# Patient Record
Sex: Male | Born: 1935 | Race: White | Hispanic: No | Marital: Married | Smoking: Never smoker
Health system: Southern US, Community
[De-identification: ages and names within clinical notes are randomized; demographics above are authoritative.]

## PROBLEM LIST (undated history)

## (undated) DIAGNOSIS — M1990 Unspecified osteoarthritis, unspecified site: Secondary | ICD-10-CM

## (undated) DIAGNOSIS — E7800 Pure hypercholesterolemia, unspecified: Secondary | ICD-10-CM

## (undated) DIAGNOSIS — I209 Angina pectoris, unspecified: Secondary | ICD-10-CM

## (undated) DIAGNOSIS — Z9861 Coronary angioplasty status: Secondary | ICD-10-CM

## (undated) DIAGNOSIS — K5792 Diverticulitis of intestine, part unspecified, without perforation or abscess without bleeding: Secondary | ICD-10-CM

## (undated) DIAGNOSIS — C44321 Squamous cell carcinoma of skin of nose: Secondary | ICD-10-CM

## (undated) DIAGNOSIS — N400 Enlarged prostate without lower urinary tract symptoms: Secondary | ICD-10-CM

## (undated) DIAGNOSIS — I340 Nonrheumatic mitral (valve) insufficiency: Secondary | ICD-10-CM

## (undated) DIAGNOSIS — Z87442 Personal history of urinary calculi: Secondary | ICD-10-CM

## (undated) DIAGNOSIS — I739 Peripheral vascular disease, unspecified: Secondary | ICD-10-CM

## (undated) DIAGNOSIS — K219 Gastro-esophageal reflux disease without esophagitis: Secondary | ICD-10-CM

## (undated) DIAGNOSIS — I2510 Atherosclerotic heart disease of native coronary artery without angina pectoris: Secondary | ICD-10-CM

## (undated) DIAGNOSIS — I779 Disorder of arteries and arterioles, unspecified: Secondary | ICD-10-CM

## (undated) DIAGNOSIS — I350 Nonrheumatic aortic (valve) stenosis: Secondary | ICD-10-CM

## (undated) DIAGNOSIS — I10 Essential (primary) hypertension: Secondary | ICD-10-CM

## (undated) HISTORY — PX: COLECTOMY: SHX59

## (undated) HISTORY — DX: Atherosclerotic heart disease of native coronary artery without angina pectoris: I25.10

## (undated) HISTORY — DX: Peripheral vascular disease, unspecified: I73.9

## (undated) HISTORY — DX: Nonrheumatic aortic (valve) stenosis: I35.0

## (undated) HISTORY — DX: Benign prostatic hyperplasia without lower urinary tract symptoms: N40.0

## (undated) HISTORY — DX: Nonrheumatic mitral (valve) insufficiency: I34.0

## (undated) HISTORY — DX: Coronary angioplasty status: Z98.61

## (undated) HISTORY — PX: TRANSURETHRAL RESECTION OF PROSTATE: SHX73

## (undated) HISTORY — PX: CATARACT EXTRACTION W/ INTRAOCULAR LENS  IMPLANT, BILATERAL: SHX1307

---
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
## 2005-01-21 ENCOUNTER — Emergency Department: Payer: Self-pay | Admitting: Emergency Medicine

## 2005-09-24 ENCOUNTER — Emergency Department: Payer: Self-pay | Admitting: Emergency Medicine

## 2006-07-31 DIAGNOSIS — I2510 Atherosclerotic heart disease of native coronary artery without angina pectoris: Secondary | ICD-10-CM | POA: Insufficient documentation

## 2006-07-31 DIAGNOSIS — Z9861 Coronary angioplasty status: Secondary | ICD-10-CM

## 2006-07-31 HISTORY — DX: Atherosclerotic heart disease of native coronary artery without angina pectoris: I25.10

## 2006-08-27 ENCOUNTER — Inpatient Hospital Stay (HOSPITAL_COMMUNITY): Admission: RE | Admit: 2006-08-27 | Discharge: 2006-08-28 | Payer: Self-pay | Admitting: *Deleted

## 2006-08-27 HISTORY — PX: OTHER SURGICAL HISTORY: SHX169

## 2006-09-19 ENCOUNTER — Encounter: Payer: PRIVATE HEALTH INSURANCE | Admitting: *Deleted

## 2006-10-01 ENCOUNTER — Encounter: Payer: PRIVATE HEALTH INSURANCE | Admitting: *Deleted

## 2006-10-31 ENCOUNTER — Encounter: Payer: PRIVATE HEALTH INSURANCE | Admitting: *Deleted

## 2006-12-01 ENCOUNTER — Encounter: Payer: PRIVATE HEALTH INSURANCE | Admitting: *Deleted

## 2006-12-31 ENCOUNTER — Encounter: Payer: PRIVATE HEALTH INSURANCE | Admitting: *Deleted

## 2007-05-27 ENCOUNTER — Ambulatory Visit: Payer: PRIVATE HEALTH INSURANCE | Admitting: Unknown Physician Specialty

## 2008-01-28 ENCOUNTER — Emergency Department: Payer: PRIVATE HEALTH INSURANCE | Admitting: Emergency Medicine

## 2009-03-01 ENCOUNTER — Inpatient Hospital Stay (HOSPITAL_COMMUNITY): Admission: RE | Admit: 2009-03-01 | Discharge: 2009-03-03 | Payer: Self-pay | Admitting: Urology

## 2009-03-01 ENCOUNTER — Encounter (INDEPENDENT_AMBULATORY_CARE_PROVIDER_SITE_OTHER): Payer: Self-pay | Admitting: Urology

## 2010-04-18 LAB — CBC
HCT: 41.8 % (ref 39.0–52.0)
Hemoglobin: 14 g/dL (ref 13.0–17.0)
MCHC: 33.4 g/dL (ref 30.0–36.0)
MCV: 95.1 fL (ref 78.0–100.0)
Platelets: 160 10*3/uL (ref 150–400)
RBC: 4.4 MIL/uL (ref 4.22–5.81)
RDW: 13.6 % (ref 11.5–15.5)
WBC: 5.5 10*3/uL (ref 4.0–10.5)

## 2010-04-18 LAB — GLUCOSE, CAPILLARY
Glucose-Capillary: 115 mg/dL — ABNORMAL HIGH (ref 70–99)
Glucose-Capillary: 117 mg/dL — ABNORMAL HIGH (ref 70–99)
Glucose-Capillary: 125 mg/dL — ABNORMAL HIGH (ref 70–99)
Glucose-Capillary: 132 mg/dL — ABNORMAL HIGH (ref 70–99)

## 2010-04-18 LAB — COMPREHENSIVE METABOLIC PANEL
ALT: 32 U/L (ref 0–53)
AST: 29 U/L (ref 0–37)
Albumin: 3.8 g/dL (ref 3.5–5.2)
Alkaline Phosphatase: 56 U/L (ref 39–117)
BUN: 13 mg/dL (ref 6–23)
CO2: 31 mEq/L (ref 19–32)
Calcium: 9.3 mg/dL (ref 8.4–10.5)
Chloride: 103 mEq/L (ref 96–112)
Creatinine, Ser: 0.97 mg/dL (ref 0.4–1.5)
GFR calc Af Amer: >60 mL/min (ref >60)
GFR calc non Af Amer: >60 mL/min (ref >60)
Glucose, Bld: 217 mg/dL — ABNORMAL HIGH (ref 70–99)
Potassium: 4.5 mEq/L (ref 3.5–5.1)
Sodium: 142 mEq/L (ref 135–145)
Total Bilirubin: 0.9 mg/dL (ref 0.3–1.2)
Total Protein: 6.5 g/dL (ref 6.0–8.3)

## 2010-04-21 LAB — GLUCOSE, CAPILLARY
Glucose-Capillary: 114 mg/dL — ABNORMAL HIGH (ref 70–99)
Glucose-Capillary: 128 mg/dL — ABNORMAL HIGH (ref 70–99)
Glucose-Capillary: 66 mg/dL — ABNORMAL LOW (ref 70–99)
Glucose-Capillary: 81 mg/dL (ref 70–99)

## 2010-06-14 NOTE — Cardiovascular Report (Signed)
Victor [name]                ACCOUNT NO.:  [id number]   MEDICAL RECORD NO.:  [id number]          PATIENT TYPE:  OIB   LOCATION:  2807                         FACILITY:  [facility]   PHYSICIAN:  Darlin Priestly, MD  DATE OF BIRTH:  [birth year]   DATE OF PROCEDURE:  08/27/2006  DATE OF DISCHARGE:                            CARDIAC CATHETERIZATION   PROCEDURE:  1. Left heart catheterization.  2. Coronary angiography.  3. Left ventriculogram.  4. RCA - mid - placement of intracoronary stent.   COMPLICATIONS:  None.   INDICATIONS:  Victor [name] is a 77 year old male patient of Dr. Shelda [name]  with a history of non-insulin-dependent diabetes mellitus,  hyperlipidemia, history of a partial colectomy secondary to  diverticulitis who underwent a screening Cardiolite in April 2007  suggesting inferior wall ischemia with a normal EF.  He has remained  asymptomatic.  After a lengthy discussion he has opted for cardiac  catheterization to evaluate his CAD.   DESCRIPTION OF PROCEDURE:  After giving informed consent, the patient  was the cardiac catheterization laboratory.  The right groin was shaved,  prepped and draped in usual sterile fashion.  ECG was established.  Using modified Seldinger technique, a #6-French arterial sheath inserted  in the right femoral artery.  A 6-French diagnostic catheter was used to  perform diagnostic angiography.   The left main is a large vessel with no significant disease.   The LAD is a medium to large vessel which courses the apex, gives rise  to two diagonal branch.  The LAD is noted have a mild calcification with  a 40% mid vessel lesion.   The first diagonal is a medium-size vessel which bifurcates distally  with no significant disease.   The second diagonal is small to medium size vessel with 40% ostial  lesion.   The left circumflex is a medium-size vessel coursing the AV groove and  gives rise to two obtuse marginal branches.  The AV groove  circumflex  has no significant disease.   The first and second OMs are medium-size vessels which bifurcate  distally with no significant disease.   The right coronary artery is a medium-size vessel which is dominant,  gives rise to both PDA as well as posterolateral branch.  There is  tortuosity noted in the RCA with calcification of the proximal mid  segment.  A 50-60% proximal lesion as well as 60% and 70% sequential  lesions in the mid RCA at the takeoff of the RV marginal branch.  The  PDA and posterolateral branch have no significant disease.   Left ventriculogram reveals a preserved EF of 60%.   HEMODYNAMICS:  Systemic arterial pressure 97/58, LV systemic pressure  109/6, LVEDP of 13.   INTERVENTIONAL PROCEDURE:  RCA - mid:  Following diagnostic angiography  a #6-French JR-4 guiding catheter with side holes was collapsed and  engaged in the right coronary ostium.  Next, a 0.014 Forte marker wire  was advanced down the guiding catheter and positioned in the distal PDA  without difficulty.  Over this, a Scimed Promus 2.5 x 23-mm stent was  then tracked across the mid RCA stenotic lesion.  This stent was then  deployed to 10 atmospheres for a total of 17 seconds.  The stent balloon  was then removed and a Quantum Maverick 2.5 x 8-mm balloon was then  placed in the distal portion of the stent and approximately five  inflations overlapping throughout the distal mid and proximal portion of  the stent were then performed to a maximum of 14 atmospheres.  Followup  angiogram revealed no evidence of dissection or thrombus with TIMI 3  flow to the distal vessel.  IV Angiomax used throughout the case.   Final orthogonal angiograms revealed less than 10% residual stenosis in  the mid RCA stenotic lesion with TIMI 3 flow to the distal vessel.  At  this point we elected to conclude the procedure.  All balloons, wires  and catheters were removed.  Hemostatic sheaths were sewn in place and   the patient transferred back to the ward in stable condition.   CONCLUSIONS:  1. Successful placement of a Scimed Promus 2.5 x 23-mm drug-eluting      stent in the mid right coronary artery stenotic lesion.  2. Normal left ventricular systolic function.  3. Mild aortic stenosis.  4. Adjuvant use of Angiomax infusion.      Darlin Priestly, MD  Electronically Signed     RHM/MEDQ  D:  08/27/2006  T:  08/27/2006  Job:  [id number]   cc:   Victor Pal, MD

## 2010-06-17 NOTE — Discharge Summary (Signed)
[name], [name]                ACCOUNT NO.:  [id number]   MEDICAL RECORD NO.:  [id number]          PATIENT TYPE:  INP   LOCATION:  6525                         FACILITY:  [facility]   PHYSICIAN:  Darlin Priestly, MD  DATE OF BIRTH:  [birth year]   DATE OF ADMISSION:  08/27/2006  DATE OF DISCHARGE:  08/28/2006                               DISCHARGE SUMMARY   PRIMARY CARE PHYSICIAN:  Dr. Lorri Frederick.   DISCHARGE DIAGNOSES:  1. Coronary artery disease status post stenting of the mid RCA with a      Promise 2.5 x 23 mm stent.  2 . Hypertension.  1. Diabetes mellitus.  2. Dyslipidemia   BRIEF HISTORY:  Victor [name] is a pleasant 78 year old male who was admitted  to [facility] for cardiac catheterization to evaluate for  obstructive coronary artery disease.  He underwent a screening  Cardiolite stress test on May 01, 2005, which revealed mild inferior  wall ischemia with a normal ejection fraction.  He has remained  asymptomatic.  However, with this abnormal Cardiolite, it was felt best  to further evaluate for obstructive disease with cardiac  catheterization.  Please see dictated history and physical for further  details.   PROCEDURE:  Left heart catheterization and coronary arteriogram were  performed on August 27, 2006, by Dr. Lenise Herald revealing a mid LAD  40% stenosis 40% proximal second diagonal.  The circumflex was normal.  In the right coronary artery, there was 50% proximal lesion, 60% mid RCA  and 70% mid RCA, and 70% lesion was stented with a __________ 2.5 x 23  mm stent taking 70% stenosis to less than 2% residual.  He was given  Angiomax  very procedurally and started on aspirin and Plavix.   COMPLICATIONS:  None.   HOSPITAL COURSE:  Post procedure, Victor [name] was admitted to telemetry in  stable condition.  His hemoglobin was 16.1, hematocrit 47.5, sodium 136,  potassium 5.1 BUN 11 and creatinine 1.09.  His glucose was 105, CK was  93, CK-MB 1.8 with troponin  of 0.01.  On the day of discharge, he was  ambulating in the hallway without difficulty and has no complaints.   DISCHARGE INSTRUCTIONS:  1. Activity: He is to avoid strenuous physical activity and no heavy      lifting for the next 2 weeks he may shower later the day of      discharge.  He is to avoid driving for the next 2-days.  He is to      keep his groin site clean and dry and call our office with any      redness, swelling or drainage from his site.  2.  Diet: He is to      follow a low-fat, low-cholesterol, low-sodium diet.   FOLLOWUP:  He is to see Dr. Jenne Campus in our [place] office on August 5  at 10:15 a.m.   DISCHARGE MEDICATIONS:  1. Aspirin 325 mg daily.  2. Plavix 75 mg daily.  3. Vytorin 10/20 mg daily.  4. Protonix 40 mg daily.  5. Avapro 150 mg  daily.  6. Glucotrol 500 mg daily.  7. Multivitamin daily.  8. Calcium 500 mg daily.  9. Fish oil 1 gram daily.     ______________________________  Charmian Muff, NP      Darlin Priestly, MD  Electronically Signed    LS/MEDQ  D:  10/16/2006  T:  10/16/2006  Job:  [id number]   cc:   Dr. Carlyn Reichert, [facility]

## 2010-08-12 ENCOUNTER — Emergency Department: Payer: PRIVATE HEALTH INSURANCE | Admitting: Emergency Medicine

## 2010-08-31 DIAGNOSIS — I350 Nonrheumatic aortic (valve) stenosis: Secondary | ICD-10-CM | POA: Insufficient documentation

## 2010-11-14 LAB — BASIC METABOLIC PANEL
BUN: 11
CO2: 32
Calcium: 9.5
Chloride: 98
Creatinine, Ser: 1.09
GFR calc Af Amer: >60
GFR calc non Af Amer: >60
Glucose, Bld: 105 — ABNORMAL HIGH
Potassium: 5.1
Sodium: 136

## 2010-11-14 LAB — CBC
HCT: 47.5
Hemoglobin: 16.1
MCHC: 33.9
MCV: 93.3
Platelets: 176
RBC: 5.09
RDW: 12.8
WBC: 8.1

## 2010-11-14 LAB — CK TOTAL AND CKMB (NOT AT ARMC)
CK, MB: 1.8
Relative Index: INVALID
Total CK: 93

## 2010-11-14 LAB — TROPONIN I: Troponin I: 0.01

## 2011-03-13 ENCOUNTER — Other Ambulatory Visit: Payer: Self-pay | Admitting: Cardiology

## 2011-03-14 ENCOUNTER — Encounter (HOSPITAL_COMMUNITY): Payer: Self-pay | Admitting: Pharmacy Technician

## 2011-03-16 ENCOUNTER — Ambulatory Visit
Admission: RE | Admit: 2011-03-16 | Discharge: 2011-03-16 | Disposition: A | Discharge status: Home / Self Care | Payer: Medicare Other | Source: Ambulatory Visit | Attending: Cardiology | Admitting: Cardiology

## 2011-03-16 ENCOUNTER — Other Ambulatory Visit: Payer: Self-pay | Admitting: Cardiology

## 2011-03-16 DIAGNOSIS — Z01818 Encounter for other preprocedural examination: Secondary | ICD-10-CM

## 2011-03-21 ENCOUNTER — Other Ambulatory Visit: Payer: Self-pay

## 2011-03-21 ENCOUNTER — Emergency Department (HOSPITAL_COMMUNITY): Payer: Medicare Other

## 2011-03-21 ENCOUNTER — Inpatient Hospital Stay (HOSPITAL_COMMUNITY)
Admission: EM | Admit: 2011-03-21 | Discharge: 2011-03-22 | DRG: 287 | Disposition: A | Discharge status: Home / Self Care | Payer: Medicare Other | Source: Ambulatory Visit | Attending: Internal Medicine | Admitting: Internal Medicine

## 2011-03-21 ENCOUNTER — Encounter (HOSPITAL_COMMUNITY): Payer: Self-pay

## 2011-03-21 DIAGNOSIS — I10 Essential (primary) hypertension: Secondary | ICD-10-CM | POA: Diagnosis present

## 2011-03-21 DIAGNOSIS — Z79899 Other long term (current) drug therapy: Secondary | ICD-10-CM

## 2011-03-21 DIAGNOSIS — E1169 Type 2 diabetes mellitus with other specified complication: Secondary | ICD-10-CM | POA: Diagnosis present

## 2011-03-21 DIAGNOSIS — E785 Hyperlipidemia, unspecified: Secondary | ICD-10-CM | POA: Diagnosis present

## 2011-03-21 DIAGNOSIS — E119 Type 2 diabetes mellitus without complications: Secondary | ICD-10-CM | POA: Diagnosis present

## 2011-03-21 DIAGNOSIS — Z880 Allergy status to penicillin: Secondary | ICD-10-CM

## 2011-03-21 DIAGNOSIS — Z87891 Personal history of nicotine dependence: Secondary | ICD-10-CM

## 2011-03-21 DIAGNOSIS — Z9861 Coronary angioplasty status: Secondary | ICD-10-CM

## 2011-03-21 DIAGNOSIS — K219 Gastro-esophageal reflux disease without esophagitis: Secondary | ICD-10-CM | POA: Diagnosis present

## 2011-03-21 DIAGNOSIS — N400 Enlarged prostate without lower urinary tract symptoms: Secondary | ICD-10-CM | POA: Diagnosis present

## 2011-03-21 DIAGNOSIS — IMO0002 Reserved for concepts with insufficient information to code with codable children: Secondary | ICD-10-CM

## 2011-03-21 DIAGNOSIS — R0789 Other chest pain: Principal | ICD-10-CM | POA: Diagnosis present

## 2011-03-21 DIAGNOSIS — M129 Arthropathy, unspecified: Secondary | ICD-10-CM | POA: Diagnosis present

## 2011-03-21 DIAGNOSIS — Z888 Allergy status to other drugs, medicaments and biological substances status: Secondary | ICD-10-CM

## 2011-03-21 DIAGNOSIS — Z7982 Long term (current) use of aspirin: Secondary | ICD-10-CM

## 2011-03-21 DIAGNOSIS — I2510 Atherosclerotic heart disease of native coronary artery without angina pectoris: Secondary | ICD-10-CM | POA: Diagnosis present

## 2011-03-21 DIAGNOSIS — R079 Chest pain, unspecified: Secondary | ICD-10-CM | POA: Diagnosis present

## 2011-03-21 HISTORY — DX: Pure hypercholesterolemia, unspecified: E78.00

## 2011-03-21 HISTORY — DX: Unspecified osteoarthritis, unspecified site: M19.90

## 2011-03-21 HISTORY — DX: Essential (primary) hypertension: I10

## 2011-03-21 HISTORY — DX: Gastro-esophageal reflux disease without esophagitis: K21.9

## 2011-03-21 HISTORY — DX: Angina pectoris, unspecified: I20.9

## 2011-03-21 LAB — CBC
HCT: 44.3 % (ref 39.0–52.0)
Hemoglobin: 15.8 g/dL (ref 13.0–17.0)
MCH: 32.9 pg (ref 26.0–34.0)
MCHC: 35.7 g/dL (ref 30.0–36.0)
MCV: 92.3 fL (ref 78.0–100.0)
Platelets: 145 10*3/uL — ABNORMAL LOW (ref 150–400)
RBC: 4.8 MIL/uL (ref 4.22–5.81)
RDW: 12.5 % (ref 11.5–15.5)
WBC: 7.4 10*3/uL (ref 4.0–10.5)

## 2011-03-21 LAB — POCT I-STAT, CHEM 8
BUN: 14 mg/dL (ref 6–23)
Calcium, Ion: 1.21 mmol/L (ref 1.12–1.32)
Chloride: 102 mEq/L (ref 96–112)
Creatinine, Ser: 0.7 mg/dL (ref 0.50–1.35)
Glucose, Bld: 113 mg/dL — ABNORMAL HIGH (ref 70–99)
HCT: 46 % (ref 39.0–52.0)
Hemoglobin: 15.6 g/dL (ref 13.0–17.0)
Potassium: 4.2 mEq/L (ref 3.5–5.1)
Sodium: 143 mEq/L (ref 135–145)
TCO2: 29 mmol/L (ref 0–100)

## 2011-03-21 LAB — POCT I-STAT TROPONIN I: Troponin i, poc: 0.01 ng/mL (ref 0.00–0.08)

## 2011-03-21 MED ORDER — ASPIRIN 81 MG PO CHEW
243.0000 mg | CHEWABLE_TABLET | Freq: Once | ORAL | Status: AC
  Administered 2011-03-21: 243 mg via ORAL
  Filled 2011-03-21: qty 3

## 2011-03-21 MED ORDER — METOPROLOL SUCCINATE 12.5 MG HALF TABLET
12.5000 mg | ORAL_TABLET | Freq: Two times a day (BID) | ORAL | Status: DC
  Administered 2011-03-21 – 2011-03-22 (×2): 12.5 mg via ORAL
  Filled 2011-03-21 (×3): qty 1

## 2011-03-21 MED ORDER — TAMSULOSIN HCL 0.4 MG PO CAPS
0.4000 mg | ORAL_CAPSULE | Freq: Every evening | ORAL | Status: DC
  Administered 2011-03-22: 0.4 mg via ORAL
  Filled 2011-03-21: qty 1

## 2011-03-21 MED ORDER — OMEGA-3-ACID ETHYL ESTERS 1 G PO CAPS
2.0000 g | ORAL_CAPSULE | Freq: Two times a day (BID) | ORAL | Status: DC
  Administered 2011-03-21 – 2011-03-22 (×2): 2 g via ORAL
  Filled 2011-03-21 (×3): qty 2

## 2011-03-21 MED ORDER — ENOXAPARIN SODIUM 80 MG/0.8ML ~~LOC~~ SOLN
1.0000 mg/kg | Freq: Once | SUBCUTANEOUS | Status: AC
  Administered 2011-03-21: 80 mg via SUBCUTANEOUS
  Filled 2011-03-21: qty 0.8

## 2011-03-21 MED ORDER — SODIUM CHLORIDE 0.9 % IJ SOLN
3.0000 mL | Freq: Two times a day (BID) | INTRAMUSCULAR | Status: DC
  Administered 2011-03-22 (×2): 3 mL via INTRAVENOUS

## 2011-03-21 MED ORDER — EZETIMIBE-SIMVASTATIN 10-40 MG PO TABS
1.0000 | ORAL_TABLET | Freq: Every day | ORAL | Status: DC
  Filled 2011-03-21: qty 1

## 2011-03-21 MED ORDER — CLOPIDOGREL BISULFATE 75 MG PO TABS
75.0000 mg | ORAL_TABLET | Freq: Every day | ORAL | Status: DC
  Administered 2011-03-22: 75 mg via ORAL
  Filled 2011-03-21: qty 1

## 2011-03-21 MED ORDER — OMEGA-3 FATTY ACIDS 1000 MG PO CAPS: 2.0000 g | ORAL_CAPSULE | Freq: Two times a day (BID) | ORAL | Status: DC

## 2011-03-21 MED ORDER — NITROGLYCERIN 0.4 MG SL SUBL: 0.4000 mg | SUBLINGUAL_TABLET | SUBLINGUAL | Status: DC | PRN

## 2011-03-21 MED ORDER — SODIUM CHLORIDE 0.9 % IJ SOLN: 3.0000 mL | INTRAMUSCULAR | Status: DC | PRN

## 2011-03-21 MED ORDER — DIAZEPAM 5 MG PO TABS
5.0000 mg | ORAL_TABLET | ORAL | Status: AC
  Administered 2011-03-22: 5 mg via ORAL
  Filled 2011-03-21: qty 1

## 2011-03-21 MED ORDER — OXYBUTYNIN CHLORIDE ER 15 MG PO TB24
15.0000 mg | ORAL_TABLET | Freq: Every day | ORAL | Status: DC
  Administered 2011-03-22: 15 mg via ORAL
  Filled 2011-03-21: qty 1

## 2011-03-21 MED ORDER — ASPIRIN EC 81 MG PO TBEC
81.0000 mg | DELAYED_RELEASE_TABLET | Freq: Every day | ORAL | Status: DC
  Administered 2011-03-22: 81 mg via ORAL
  Filled 2011-03-21: qty 1

## 2011-03-21 MED ORDER — SODIUM CHLORIDE 0.9 % IV SOLN: 250.0000 mL | INTRAVENOUS | Status: DC | PRN

## 2011-03-21 MED ORDER — ONDANSETRON HCL 4 MG/2ML IJ SOLN: 4.0000 mg | Freq: Four times a day (QID) | INTRAMUSCULAR | Status: DC | PRN

## 2011-03-21 MED ORDER — ACETAMINOPHEN 325 MG PO TABS: 650.0000 mg | ORAL_TABLET | ORAL | Status: DC | PRN

## 2011-03-21 MED ORDER — SODIUM CHLORIDE 0.9 % IV SOLN
INTRAVENOUS | Status: DC
  Administered 2011-03-22: 01:00:00 via INTRAVENOUS

## 2011-03-21 NOTE — ED Notes (Signed)
Awaiting first cardiac markers prior to transporting to [facility].

## 2011-03-21 NOTE — ED Notes (Signed)
Pt. Is scheduled for a cardiac cath in the am, Dr. Clarene Duke.   And pt. Developed chest pain this evening, center of chest  Into his lt. Jaw.  Pt. Denies any sob n/v , pt. States, "I feel weird"

## 2011-03-21 NOTE — H&P (Signed)
Patient's PCP: No primary provider on file.  Chief Complaint: chest pain  History of Present Illness: Victor [name] is a 78 y.o. caucasian male who was scheduled for a cardiac cath in the am.  He was starting to cook dinner when he started having chest pain that came and went.  Non-radiating and lasting few seconds. Dull in nature.  No nausea, no shortness of breath.  Current chest pain free.     Meds: Scheduled Meds:    . aspirin  243 mg Oral Once   Continuous Infusions:  PRN Meds:. Allergies: Amoxicillin; Azithromycin; Cephalosporins; Codeine; Oraxyl; Pseudoephedrine; and Levaquin Past Medical History  Diagnosis Date  . Coronary artery disease   . Hypertension   . Heart murmur   . Arthritis   . Diabetes mellitus    Past Surgical History  Procedure Date  . Colon surgery   . Coronary stent placement   . Transurethral resection of prostate    No CAD in family history . History   Social History  . Marital Status: Married    Spouse Name: N/A    Number of Children: N/A  . Years of Education: N/A   Occupational History  . Not on file.   Social History Main Topics  . Smoking status: Never Smoker   . Smokeless tobacco: Not on file  . Alcohol Use: No     occasional   . Drug Use: No  . Sexually Active: No   Other Topics Concern  . Not on file   Social History Narrative  . No narrative on file   Review of Systems: All systems reviewed with the patient and positive as per history of present illness, otherwise all other systems are negative.   Physical Exam: Blood pressure 138/66, pulse 65, temperature 98 F (36.7 C), temperature source Oral, resp. rate 18, height 5\' 10"  (1.778 m), weight 79.833 kg (176 lb), SpO2 98.00%. General: Awake, Oriented x3, No acute distress. HEENT: EOMI, Moist mucous membranes Neck: Supple CV: S1 and S2, murmur Lungs: Clear to ascultation bilaterally, no wheezing Abdomen: Soft, Nontender, Nondistended, +bowel sounds. Ext: Good  pulses. Trace edema. No clubbing or cyanosis noted. Neuro: Cranial Nerves II-XII grossly intact. Has 5/5 motor strength in upper and lower extremities.    Lab results:  [facility] 03/21/11 2007  NA 143  K 4.2  CL 102  CO2 --  GLUCOSE 113*  BUN 14  CREATININE 0.70  CALCIUM --  MG --  PHOS --      Basename 03/21/11 2007 03/21/11 1943  WBC -- 7.4  NEUTROABS -- --  HGB 15.6 15.8  HCT 46.0 44.3  MCV -- 92.3  PLT -- 145*          Imaging results:  Dg Chest 2 View  03/21/2011  *RADIOLOGY REPORT*  Clinical Data: 78 year old male with chest pain.  CHEST - 2 VIEW  Comparison: 03/16/2011 and earlier.  Findings: Normal lung volumes.  Cardiac size and mediastinal contours are within normal limits.  Visualized tracheal air column is within normal limits.  No pneumothorax, pulmonary edema, pleural effusion or confluent pulmonary opacity. Stable visualized osseous structures.  IMPRESSION: Negative, no acute cardiopulmonary abnormality.  Original Report Authenticated By: Harley Hallmark, M.D.   Dg Chest 2 View  03/16/2011  *RADIOLOGY REPORT*  Clinical Data: Preop for cardiac catheterization, diabetes  CHEST - 2 VIEW  Comparison: None.  Findings: The lungs are clear.  No active infiltrate or effusion is seen.  Mediastinal contours appear normal.  The  heart is within normal limits in size.  There are degenerative changes throughout the thoracic spine.  IMPRESSION: No active lung disease.  Original Report Authenticated By: Juline Patch, M.D.   Other results: EKG: NSR, no murmur  Assessment & Plan by Problem:   *Chest pain- CE, consult cards, NPO after midnight for cath in AM. FLP   HTN (hypertension)- cont home med   HLD (hyperlipidemia)- FLP   Diabetes mellitus- hold home meds   BPH (benign prostatic hyperplasia)- cont home meds   Time spent on admission, talking to the patient, and coordinating care was: 33 mins.  Ankur Snowdon, DO 03/21/2011, 9:16 PM

## 2011-03-21 NOTE — ED Notes (Signed)
Pt up to bathroom with no difficulty. Pt denies SOB, CP, and nausea/ vomiting at this time.

## 2011-03-21 NOTE — ED Notes (Signed)
Pt without acute changes in status. Smiles and converses with ease.

## 2011-03-21 NOTE — ED Provider Notes (Signed)
History     CSN: [id number]  Arrival date & time 03/21/11  1820   First MD Initiated Contact with Patient 03/21/11 1900      Chief Complaint  Patient presents with  . Chest Pain    (Consider location/radiation/quality/duration/timing/severity/associated sxs/prior treatment) HPI Complains of anterior chest pain onset today, nonradiating lasts 4 seconds at a time patient presently asymptomatic. No associated nausea sweatiness or shortness of breath. Nothing makes symptoms better or worse pain is dull in quality Patient scheduled for cardiac catheterization tomorrow by Dr. Clarene Duke. He himself with his usual medications today Past Medical History  Diagnosis Date  . Coronary artery disease   . Hypertension    Diabetes Past Surgical History  Procedure Date  . Colon surgery   . Coronary stent placement     No family history on file.  History  Substance Use Topics  . Smoking status: Never Smoker   . Smokeless tobacco: Not on file  . Alcohol Use: No     occasional       Review of Systems  Constitutional: Negative.   HENT: Negative.   Respiratory: Negative.   Cardiovascular: Positive for chest pain.  Gastrointestinal: Negative.   Musculoskeletal: Negative.   Skin: Negative.   Neurological: Negative.   Hematological: Negative.   Psychiatric/Behavioral: Negative.   All other systems reviewed and are negative.    Allergies  Amoxicillin; Azithromycin; Cephalosporins; Codeine; Oraxyl; Pseudoephedrine; and Levaquin  Home Medications   Current Outpatient Rx  Name Route Sig Dispense Refill  . ASPIRIN EC 81 MG PO TBEC Oral Take 81 mg by mouth daily.    Marland Kitchen CALCIUM CARBONATE-VITAMIN D 600-400 MG-UNIT PO TABS Oral Take 1 tablet by mouth daily.    Marland Kitchen VITAMIN D3 2000 UNITS PO TABS Oral Take 1 tablet by mouth daily.    Marland Kitchen CLOPIDOGREL BISULFATE 75 MG PO TABS Oral Take 75 mg by mouth daily.    Marland Kitchen EZETIMIBE-SIMVASTATIN 10-40 MG PO TABS Oral Take 1 tablet by mouth at bedtime.    .  OMEGA-3 FATTY ACIDS 1000 MG PO CAPS Oral Take 2 g by mouth 2 (two) times daily.     Marland Kitchen GLIPIZIDE ER 5 MG PO TB24 Oral Take 5 mg by mouth daily.    Marland Kitchen MAGNESIUM PO Oral Take 1 tablet by mouth daily.    Marland Kitchen METOPROLOL SUCCINATE ER 25 MG PO TB24 Oral Take 12.5 mg by mouth 2 (two) times daily.     . OSTEO BI-FLEX ADV JOINT SHIELD PO Oral Take 1 tablet by mouth daily.    . ADULT MULTIVITAMIN W/MINERALS CH Oral Take 1 tablet by mouth daily.    Marland Kitchen OVER THE COUNTER MEDICATION Oral Take 1 tablet by mouth daily. Cinnamon plus chromium 1000mg     . OVER THE COUNTER MEDICATION Oral Take 1 tablet by mouth daily. Peenut for prostate    . OXYBUTYNIN CHLORIDE ER 15 MG PO TB24 Oral Take 15 mg by mouth daily.    . SAW PALMETTO 450 MG PO CAPS Oral Take 2 capsules by mouth daily.    Marland Kitchen TAMSULOSIN HCL 0.4 MG PO CAPS Oral Take 0.4 mg by mouth every evening.    Marland Kitchen ZINC 50 MG PO CAPS Oral Take 50 mg by mouth daily.      BP 157/77  Pulse 65  Temp(Src) 98 F (36.7 C) (Oral)  Resp 18  Ht 5\' 10"  (1.778 m)  Wt 176 lb (79.833 kg)  BMI 25.25 kg/m2  SpO2 93%  Physical Exam  Nursing note and vitals reviewed. Constitutional: He appears well-developed and well-nourished.  HENT:  Head: Normocephalic and atraumatic.  Eyes: Conjunctivae are normal. Pupils are equal, round, and reactive to light.  Neck: Neck supple. No tracheal deviation present. No thyromegaly present.  Cardiovascular: Normal rate and regular rhythm.   No murmur heard. Pulmonary/Chest: Effort normal and breath sounds normal.  Abdominal: Soft. Bowel sounds are normal. He exhibits no distension. There is no tenderness.  Musculoskeletal: Normal range of motion. He exhibits no edema and no tenderness.  Neurological: He is alert. Coordination normal.  Skin: Skin is warm and dry. No rash noted.  Psychiatric: He has a normal mood and affect.    Date: 03/21/2011  Rate: 65  Rhythm: normal sinus rhythm  QRS Axis: normal  Intervals: normal  ST/T Wave  abnormalities: nonspecific T wave changes  Conduction Disutrbances:none  Narrative Interpretation:   Old EKG Reviewed: unchanged No change from 08/27/2006 ED Course  Procedures (including critical care time) 830 p.m. remains asymptomatic Spoke with hospitalist physician Labs Reviewed - No data to display No results found.   No diagnosis found.    MDM  Plan telemetry observation Hospitalist consult cardiology Diagnosis chest pain        Doug Sou, MD 03/21/11 2042

## 2011-03-21 NOTE — ED Notes (Signed)
Pt returned from radiology.

## 2011-03-22 ENCOUNTER — Encounter (HOSPITAL_COMMUNITY): Payer: Self-pay | Admitting: General Practice

## 2011-03-22 ENCOUNTER — Encounter (HOSPITAL_COMMUNITY)
Admission: EM | Disposition: A | Discharge status: Home / Self Care | Payer: Self-pay | Source: Ambulatory Visit | Attending: Internal Medicine

## 2011-03-22 ENCOUNTER — Ambulatory Visit (HOSPITAL_COMMUNITY): Admission: RE | Admit: 2011-03-22 | Payer: Medicare Other | Source: Ambulatory Visit | Admitting: Cardiology

## 2011-03-22 ENCOUNTER — Other Ambulatory Visit: Payer: Self-pay

## 2011-03-22 HISTORY — PX: LEFT HEART CATHETERIZATION WITH CORONARY ANGIOGRAM: SHX5451

## 2011-03-22 LAB — BASIC METABOLIC PANEL
BUN: 14 mg/dL (ref 6–23)
CO2: 28 mEq/L (ref 19–32)
Calcium: 9.2 mg/dL (ref 8.4–10.5)
Chloride: 104 mEq/L (ref 96–112)
Creatinine, Ser: 0.79 mg/dL (ref 0.50–1.35)
GFR calc Af Amer: >90 mL/min (ref >90)
GFR calc non Af Amer: 84 mL/min — ABNORMAL LOW (ref >90)
Glucose, Bld: 97 mg/dL (ref 70–99)
Potassium: 3.9 mEq/L (ref 3.5–5.1)
Sodium: 141 mEq/L (ref 135–145)

## 2011-03-22 LAB — GLUCOSE, CAPILLARY
Glucose-Capillary: 116 mg/dL — ABNORMAL HIGH (ref 70–99)
Glucose-Capillary: 97 mg/dL (ref 70–99)
Glucose-Capillary: 120 mg/dL — ABNORMAL HIGH (ref 70–99)
Glucose-Capillary: 141 mg/dL — ABNORMAL HIGH (ref 70–99)

## 2011-03-22 LAB — CBC
HCT: 42.7 % (ref 39.0–52.0)
Hemoglobin: 14.8 g/dL (ref 13.0–17.0)
MCH: 31.8 pg (ref 26.0–34.0)
MCHC: 34.7 g/dL (ref 30.0–36.0)
MCV: 91.6 fL (ref 78.0–100.0)
Platelets: 147 10*3/uL — ABNORMAL LOW (ref 150–400)
RBC: 4.66 MIL/uL (ref 4.22–5.81)
RDW: 12.6 % (ref 11.5–15.5)
WBC: 6.1 10*3/uL (ref 4.0–10.5)

## 2011-03-22 LAB — CARDIAC PANEL(CRET KIN+CKTOT+MB+TROPI)
CK, MB: 4.1 ng/mL — ABNORMAL HIGH (ref 0.3–4.0)
CK, MB: 3.5 ng/mL (ref 0.3–4.0)
Relative Index: 2.2 (ref 0.0–2.5)
Relative Index: 2.5 (ref 0.0–2.5)
Total CK: 190 U/L (ref 7–232)
Total CK: 138 U/L (ref 7–232)
Troponin I: <0.3 ng/mL (ref <0.30)
Troponin I: <0.3 ng/mL (ref <0.30)

## 2011-03-22 LAB — LIPID PANEL
Cholesterol: 127 mg/dL (ref 0–200)
HDL: 48 mg/dL (ref >39)
LDL Cholesterol: 52 mg/dL (ref 0–99)
Total CHOL/HDL Ratio: 2.6 RATIO
Triglycerides: 133 mg/dL (ref <150)
VLDL: 27 mg/dL (ref 0–40)

## 2011-03-22 LAB — PROTIME-INR
INR: 1.06 (ref 0.00–1.49)
Prothrombin Time: 14 seconds (ref 11.6–15.2)

## 2011-03-22 SURGERY — LEFT HEART CATHETERIZATION WITH CORONARY ANGIOGRAM
Anesthesia: LOCAL

## 2011-03-22 MED ORDER — HEPARIN SODIUM (PORCINE) 1000 UNIT/ML IJ SOLN
INTRAMUSCULAR | Status: AC
  Filled 2011-03-22: qty 1

## 2011-03-22 MED ORDER — HEPARIN (PORCINE) IN NACL 2-0.9 UNIT/ML-% IJ SOLN
INTRAMUSCULAR | Status: AC
  Filled 2011-03-22: qty 1000

## 2011-03-22 MED ORDER — ONDANSETRON HCL 4 MG/2ML IJ SOLN: 4.0000 mg | Freq: Four times a day (QID) | INTRAMUSCULAR | Status: DC | PRN

## 2011-03-22 MED ORDER — NITROGLYCERIN 0.4 MG SL SUBL: 0.4000 mg | SUBLINGUAL_TABLET | SUBLINGUAL | Status: DC | PRN

## 2011-03-22 MED ORDER — VERAPAMIL HCL 2.5 MG/ML IV SOLN
INTRAVENOUS | Status: AC
  Filled 2011-03-22: qty 2

## 2011-03-22 MED ORDER — MORPHINE SULFATE 2 MG/ML IJ SOLN: 1.0000 mg | INTRAMUSCULAR | Status: DC | PRN

## 2011-03-22 MED ORDER — DIPHENHYDRAMINE HCL 25 MG PO CAPS
25.0000 mg | ORAL_CAPSULE | Freq: Once | ORAL | Status: AC
  Administered 2011-03-22: 25 mg via ORAL
  Filled 2011-03-22: qty 1

## 2011-03-22 MED ORDER — SODIUM CHLORIDE 0.9 % IJ SOLN: 3.0000 mL | Freq: Two times a day (BID) | INTRAMUSCULAR | Status: DC

## 2011-03-22 MED ORDER — SODIUM CHLORIDE 0.9 % IV SOLN: 250.0000 mL | INTRAVENOUS | Status: DC

## 2011-03-22 MED ORDER — SODIUM CHLORIDE 0.9 % IV SOLN: 1.0000 mL/kg/h | INTRAVENOUS | Status: AC

## 2011-03-22 MED ORDER — SODIUM CHLORIDE 0.9 % IJ SOLN: 3.0000 mL | INTRAMUSCULAR | Status: DC | PRN

## 2011-03-22 MED ORDER — NITROGLYCERIN 0.2 MG/ML ON CALL CATH LAB
INTRAVENOUS | Status: AC
  Filled 2011-03-22: qty 1

## 2011-03-22 MED ORDER — LIDOCAINE HCL (PF) 1 % IJ SOLN
INTRAMUSCULAR | Status: AC
  Filled 2011-03-22: qty 30

## 2011-03-22 MED ORDER — ACETAMINOPHEN 325 MG PO TABS: 650.0000 mg | ORAL_TABLET | ORAL | Status: DC | PRN

## 2011-03-22 NOTE — Progress Notes (Signed)
D/c instructions given to pt and family regarding f/u care and appointments, s/s of when to notify MD, home medications, and diet/activity restrictions.  Pt acknowledged receipt with all questions answered and pt and family verbalized understanding.  IVs and telemetry removed from pt.  Will transport pt via wheelchair when ride is ready.

## 2011-03-22 NOTE — Progress Notes (Signed)
Utilization review completed.  

## 2011-03-22 NOTE — Op Note (Signed)
[facility]     CARDIAC CATHETERIZATION REPORT  NAME: Victor [name]   MRN: [id number] DOB: [birth year]   ADMIT DATE:  03/21/2011  Performing Cardiologist: Marykay Lex  Primary Physician: Shelda Pal, MD, MD Primary Cardiologist:  Julieanne Manson, M.D.  Procedures Performed:  Left Heart Catheterization via 5 Fr Right Radial Artery access  Left Ventriculography, RAO 11 ml/sec for 33 ml total contrast  Native Coronary Angiography  Indication(s): Chest pain with abnormal ECG portion of nuclear stress test.  History: 78 y.o. male with a past medical history significant for known coronary disease status post PCI RCA as well as mild/moderate stenosis hypertension diabetes and hyperlipidemia all widely treated. He was in the relatively well but was seen in order little in February 2000 the TM noted to have inferior T-wave inversions in 3 and aVF which raised concern for per possible recurrences RCA stenosis pacer swelling and went to the nuclear stress test which was positive ECG standpoint with inferolateral changes but no evidence of ischemia. However based on the extreme positivity of the EKG section, he was referred for cardiac catheterization. He otherwise a symptomatically until last night when he started having shortness of substernal chest discomfort [place] them to come into the emergency room. He was admitted to the hospital as he was planned for outpatient cardiac catheterization today.  He otherwise denies any him other cardiac symptoms of shortness of breath just with rest exertion. No PND, orthopnea or edema. No palpitations syncope or near significant symptoms. No TIA or amaurosis fugax symptoms. No claudication symptoms.    Consent: The procedure with Risks/Benefits/Alternatives and Indications was reviewed with the patient.  All questions were answered.    Risks / Complications include, but not limited to: Death, MI, CVA/TIA, VF/VT (with  defibrillation), Bradycardia (need for temporary pacer placement), contrast induced nephropathy, bleeding / bruising / hematoma / pseudoaneurysm, vascular or coronary injury (with possible emergent CT or Vascular Surgery), adverse medication reactions, infection.    The patient (and family) voice understanding and agree to proceed.    Consent for signed by MD and patient with RN witness -- placed on chart.  Procedure: The patient was brought to the 2nd Floor [facility] in the fasting state and prepped and draped in the usual sterile fashion for (Right groin or radial) access.  A modified Allen's test with plethysmography was performed on the right wrist demonstrating adequate Ulnar Artery collateral flow.    Sterile technique was used including antiseptics, cap, gloves, gown, hand hygiene, mask and sheet.  Skin prep: Chlorhexidine;  Time Out: Verified patient identification, verified procedure, site/side was marked, verified correct patient position, special equipment/implants available, medications/allergies/relevent history reviewed, required imaging and test results available.  Performed  The right wrist was anesthetized with 1% subcutaneous Lidocaine.  The right radial artery was accessed using the Seldinger Technique with placement of a 5 Fr Glide Sheath. The sheath was aspirated and flushed.  Then a total of 10 ml of standard Radial Artery Cocktail (see medications) was infused.  Radial Cocktail: 5 mg Verapamil, 400 mcg NTG, 2 ml 2% Lidocaine  A 5 Fr TIG 4.0 Catheter was advanced of over a Versicore wire into the ascending Aorta.  The catheter was used to engage Both the Left and Right Coronary Artery.  Multiple cineangiographic views of the Left then Right , Z. (s) were performed.   This catheter was then exchanged over the [employer] J wire for an angled Pigtail catheter  that was advanced across the Aortic Valve.  LV hemodynamics were measured and Left  Ventriculography was performed.  LV hemodynamics were then re-sampled, and the catheter was pulled back across the Aortic Valve for measurement of "pull-back" gradient.  The catheter and  herthe wire was removed completely out of the body.  The sheath was removed and in the Cath Lab with a TR band placed at  11 ml Air at  1025 hours (time).  Reverse Allen's test revealed non-occlusive hemostasis.  The patient was transported to the PACU in a chest pain free, and hemodynamically stable condition.   The patient  was stable before, during and following the procedure.   Patient did tolerate procedure well. There were not complications.  EBL: < 10 mL  Medications:  Premedication:  5mg   Valium  Sedation:   None given   Contrast:   90 ml Omnipaque  IV Heparin: 4000 units  Radial Cocktail: 5 mg Verapamil, 400 mcg NTG, 2 ml 2% Lidocaine  Hemodynamics:  Central Aortic Pressure / Mean Aortic Pressure:  120/69 mmHg; 91 mmHg.  LV Pressure / LV End diastolic Pressure:   142 over a delivery; 50 mmHg.   Aortic Stenosis Gradient: Peak 20 mm Hg; mean 13 mmHg  Left Ventriculography:  EF:   55-60%  Wall Motion:  no significant wall motion abnormalities  Coronary Angiographic Data:  Left Main:   Angiographic normal; bifurcation into LAD and circumflex  Left Anterior Descending (LAD):   Large caliber vessel very tortuous extends around the apex. He is off 3 diagonal branches. The first second diagonal branch of moderate caliber with no severe lesion. The third diagonal branch is a 60-70% ostial lesion. The rest of the LAD system has no significant disease.   Circumflex (LCx):   Large caliber tortuous vessel gives rise to a very proximal small OM followed by bifurcating into a second OM the bifurcates itself a moderate caliber. The AV groove circumflex was a continues on his left posterior lateral branch vessel.  Th the Circumflex Artery System has no significant disease noted in this vessel.  Right  Coronary Artery:  moderate caliber vessel with a proximal 50-60% lesion followed by a mild ectatic post stenotic segment. There is a stent in the midportion is widely patent. There is a distally a tubular 30-40% lesion.  The remainder the vessel system includes a posterior ascending artery and a small posterior lateral system with no significant disease; minimal irregularities  Impression:  Widely patent RCA stent, with mild progression of proximal RCA disease to moderate 50-60% lesion.  Progression of ostial D3 lesion now 60-70% moderate severe  -- not optimal for PCI given the ostial nature and that it is a 2.0-2.25 mm vessel -- the concern is impinging upon the main channel LAD.  Mild to moderate Aortic Stenosis. Peak gradient of 20-22 mmHg with a mean gradient of 13 mm mercury.  Well preserved Ejection Fraction of 55-60% with no wall motion abnormalities.  No obvious lesion that would be responsible for the EKG changes noted on treadmill stress test or for resting chest pain that is amenable to PCI.  Plan:  Continue to optimize medical therapy. We'll give prescription for when necessary nitroglycerin sublingual  Plan discharge today after was radial cath care.  The case and results was discussed with the patient and family. The case and results was not discussed with the patient's PCP. The case and results was discussed with the patient's Cardiologist.  Time Spend Directly with Patient:  40  minutes  Chelisa Hennen W, M.D., M.S. [facility] [address] [place], [place]  [address]  [phone]  03/22/2011 10:48 AM

## 2011-03-22 NOTE — Progress Notes (Signed)
Cath lab called for pt at 0905.  Pre med Valium given per order.  Pt AM meds, Lovana, Aspirin, Troprol and Plavix given per MD verbal ok to give. Informed consent verified and signed.  VSS, telemetry removed and pt transferred to transport bed.

## 2011-03-22 NOTE — Progress Notes (Signed)
Subjective: Patient admitted last night with chest pain.  Currently chest pain free.  For LHC today.   Objective: Vital signs in last 24 hours: Filed Vitals:   03/22/11 0045 03/22/11 0317 03/22/11 0646 03/22/11 0908  BP: 143/77 125/78 145/83 148/81  Pulse: 89 86 54 69  Temp: 98.1 F (36.7 C) 98 F (36.7 C) 97.9 F (36.6 C) 98.4 F (36.9 C)  TempSrc:      Resp: 16 18 16 16   Height: 5\' 10"  (1.778 m)     Weight: 77.7 kg (171 lb 4.8 oz)     SpO2: 93% 94% 93% 93%   Weight change:   Intake/Output Summary (Last 24 hours) at 03/22/11 4540 Last data filed at 03/22/11 0910  Gross per 24 hour  Intake 826.75 ml  Output   1450 ml  Net -623.25 ml    Physical Exam: General: Awake, Oriented, No acute distress. HEENT: EOMI. Neck: Supple CV: S1 and S2, RRR Lungs: Clear to ascultation bilaterally, no wheezing Abdomen: Soft, Nontender, Nondistended, +bowel sounds. Ext: Good pulses. Trace edema.   Lab Results:  [facility] 03/22/11 0545 03/21/11 2007  NA 141 143  K 3.9 4.2  CL 104 102  CO2 28 --  GLUCOSE 97 113*  BUN 14 14  CREATININE 0.79 0.70  CALCIUM 9.2 --  MG -- --  PHOS -- --      Basename 03/22/11 0545 03/21/11 2007 03/21/11 1943  WBC 6.1 -- 7.4  NEUTROABS -- -- --  HGB 14.8 15.6 --  HCT 42.7 46.0 --  MCV 91.6 -- 92.3  PLT 147* -- 145*    Basename 03/22/11 0545 03/21/11 2314  CKTOTAL 138 190  CKMB 3.5 4.1*  CKMBINDEX -- --  TROPONINI <0.30 <0.30   No components found with this basename: POCBNP:3 No results found for this basename: DDIMER:2 in the last 72 hours No results found for this basename: HGBA1C:2 in the last 72 hours  Basename 03/22/11 0545  CHOL 127  HDL 48  LDLCALC 52  TRIG 133  CHOLHDL 2.6  LDLDIRECT --   No results found for this basename: TSH,T4TOTAL,FREET3,T3FREE,THYROIDAB in the last 72 hours No results found for this basename: VITAMINB12:2,FOLATE:2,FERRITIN:2,TIBC:2,IRON:2,RETICCTPCT:2 in the last 72 hours  Micro Results: No  results found for this or any previous visit (from the past 240 hour(s)).  Studies/Results: Dg Chest 2 View  03/21/2011  *RADIOLOGY REPORT*  Clinical Data: 78 year old male with chest pain.  CHEST - 2 VIEW  Comparison: 03/16/2011 and earlier.  Findings: Normal lung volumes.  Cardiac size and mediastinal contours are within normal limits.  Visualized tracheal air column is within normal limits.  No pneumothorax, pulmonary edema, pleural effusion or confluent pulmonary opacity. Stable visualized osseous structures.  IMPRESSION: Negative, no acute cardiopulmonary abnormality.  Original Report Authenticated By: Harley Hallmark, M.D.    Medications: I have reviewed the patient's current medications. Scheduled Meds:   . aspirin  243 mg Oral Once  . aspirin EC  81 mg Oral Daily  . clopidogrel  75 mg Oral Daily  . diazepam  5 mg Oral On Call  . diphenhydrAMINE  25 mg Oral Once  . enoxaparin (LOVENOX) injection  1 mg/kg Subcutaneous Once  . ezetimibe-simvastatin  1 tablet Oral QHS  . heparin      . lidocaine      . metoprolol succinate  12.5 mg Oral BID  . nitroGLYCERIN      . omega-3 acid ethyl esters  2 g Oral BID  . oxybutynin  15 mg Oral Daily  . sodium chloride  3 mL Intravenous Q12H  . Tamsulosin HCl  0.4 mg Oral QPM  . DISCONTD: fish oil-omega-3 fatty acids  2 g Oral BID   Continuous Infusions:   . sodium chloride 75 mL/hr at 03/22/11 0030   PRN Meds:.sodium chloride, acetaminophen, nitroGLYCERIN, ondansetron (ZOFRAN) IV, sodium chloride, sodium chloride  Assessment/Plan:   *Chest pain- for LHC today   HTN (hypertension)- stable   HLD (hyperlipidemia)- stable on statin   Diabetes mellitus- restart home med once eating   BPH (benign prostatic hyperplasia)- cont home meds  If LHC ok, plan for discharge today.     LOS: 1 day  Victor Krahn, DO 03/22/2011, 9:22 AM

## 2011-03-22 NOTE — Consult Note (Signed)
[facility]  CARDIOLOGY CONSULTATION NOTE.  NAME:  Victor [name]   MRN: [id number] DOB:  07-21-1931    ADMIT DATE: 03/21/2011  Reason for Consult: Chest Pain - planned Cardiac Catheterization Requesting Physician: Dr. Marlin Canary Primary Cardiologist: Julieanne Manson, MD, [facility]  HPI: This is a 78 y.o. male with a past medical history significant for known coronary disease status post PCI RCA as well as mild/moderate stenosis hypertension diabetes and hyperlipidemia all widely treated. He was in the relatively well but was seen in order little in February 2000 the TM noted to have inferior T-wave inversions in 3 and aVF which raised concern for per possible recurrences RCA stenosis pacer swelling and went to the nuclear stress test which was positive ECG standpoint with inferolateral changes but no evidence of ischemia. However based on the extreme positivity of the EKG section, he was referred for cardiac catheterization. He otherwise a symptomatically until last night when he started having shortness of substernal chest discomfort [place] them to come into the emergency room. He was admitted to the hospital as he was planned for outpatient cardiac catheterization today. He otherwise denies any him other cardiac symptoms of shortness of breath just with rest exertion. No PND, orthopnea or edema. No palpitations syncope or near significant symptoms. No TIA or amaurosis fugax symptoms. No claudication symptoms.   PMHx:  CARDIAC HISTORY: Echo:  08/2010: EF > 75%, mild to moderate AS Cath:  07/2006: RCA - tandem 50-60%, 60-70% mid -- Promus DES 2.5 mm x 23mm NST:  03/2011: (for inferior TWI) -- Normal perfusion images, but ++ ECG changes in Inferolateral leads.  Past Medical History  Diagnosis Date  . Coronary artery disease     PCI to RCA - Promus DES 2.5 mm x 23 mm  (07/2006)  . Heart murmur -- mild-moderate AS  Most recent Echo 08/2010  . Arthritis   . Diabetes  mellitus   . High cholesterol   . Hypertension     "from the diabetes"  . Angina   . GERD (gastroesophageal reflux disease)    Past Surgical History  Procedure Date  . Transurethral resection of prostate ~ 2010  . Colectomy ~ 2000  . Coronary angioplasty with stent placement  07/2006    "1"    FAMHx: Reviewed - but not pertinent based upon the patient's own RFs & PMH>  SOCHx:  Ex Smoker. He quit smokeless tobacco use about 15 years ago. His smokeless tobacco use included Chew. He reports that he drinks about 3.6 ounces of alcohol per week. He reports that he does not use illicit drugs. Married father of 2.  ALLERGIES: Allergies  Allergen Reactions  . Oraxyl (Doxycycline Hyclate) Swelling    "started swelling in my mouth & tongue; had to go to emergency room; really bad reaction"  . Azithromycin Rash  . Cephalosporins Other (See Comments)    unknown  . Pseudoephedrine Other (See Comments)    unknown  . Amoxicillin Rash  . Codeine Itching and Rash  . Levaquin Rash    ROS: chest pain A comprehensive review of systems was negative except for: Integument/breast: positive for rash that began this AM & is improved. Review of Systems - General ROS: negative for - chills, fatigue, fever, malaise, night sweats, sleep disturbance, weight gain or weight loss Psychological ROS: negative Ophthalmic ROS: negative for - blurry vision, double vision, loss of vision, photophobia, scotomata or Amaurosis Fugax ENT ROS: positive for - nasal congestion Allergy  and Immunology ROS: positive for - nasal congestion, seasonal allergies and Allergies noted above, " broke out in rash this AM" - better Hematological and Lymphatic ROS: negative for - bleeding problems, blood clots, bruising, jaundice or night sweats Endocrine ROS: negative for - malaise/lethargy, mood swings, polydipsia/polyuria, temperature intolerance or unexpected weight changes Respiratory ROS: no cough, shortness of breath, or  wheezing Cardiovascular ROS: negative for - dyspnea on exertion, edema, irregular heartbeat, loss of consciousness, palpitations, paroxysmal nocturnal dyspnea, rapid heart rate or shortness of breath Gastrointestinal ROS: no abdominal pain, change in bowel habits, or black or bloody stools Genito-Urinary ROS: no dysuria, trouble voiding, or hematuria Musculoskeletal ROS: negative for - gait disturbance, joint pain, joint stiffness, joint swelling or muscular weakness Neurological ROS: no TIA or stroke symptoms  HOME MEDICATIONS: Prescriptions prior to admission  Medication Sig Dispense Refill  . aspirin EC 81 MG tablet Take 81 mg by mouth daily.      . Calcium Carbonate-Vitamin D (CALCIUM 600+D) 600-400 MG-UNIT per tablet Take 1 tablet by mouth daily.      . Cholecalciferol (VITAMIN D3) 2000 UNITS TABS Take 1 tablet by mouth daily.      . clopidogrel (PLAVIX) 75 MG tablet Take 75 mg by mouth daily.      Marland Kitchen ezetimibe-simvastatin (VYTORIN) 10-40 MG per tablet Take 1 tablet by mouth at bedtime.      . fish oil-omega-3 fatty acids 1000 MG capsule Take 2 g by mouth 2 (two) times daily.       Marland Kitchen glipiZIDE (GLUCOTROL XL) 5 MG 24 hr tablet Take 5 mg by mouth daily.      Marland Kitchen MAGNESIUM PO Take 1 tablet by mouth daily.      . metoprolol succinate (TOPROL-XL) 25 MG 24 hr tablet Take 12.5 mg by mouth 2 (two) times daily.       . Misc Natural Products (OSTEO BI-FLEX ADV JOINT SHIELD PO) Take 1 tablet by mouth daily.      . Multiple Vitamin (MULITIVITAMIN WITH MINERALS) TABS Take 1 tablet by mouth daily.      Marland Kitchen OVER THE COUNTER MEDICATION Take 1 tablet by mouth daily. Cinnamon plus chromium 1000mg       . OVER THE COUNTER MEDICATION Take 1 tablet by mouth daily. Peenut for prostate      . oxybutynin (DITROPAN XL) 15 MG 24 hr tablet Take 15 mg by mouth daily.      . Saw Palmetto 450 MG CAPS Take 2 capsules by mouth daily.      . Tamsulosin HCl (FLOMAX) 0.4 MG CAPS Take 0.4 mg by mouth every evening.      . Zinc  50 MG CAPS Take 50 mg by mouth daily.        HOSPITAL MEDICATIONS: I have reviewed the patient's current medications.  ASA 81mg , Plavix 75 mg, Metoprolol Tartrate 12.5 mg bid, Glucotrol 5mg  daily,  Vytorin 10/40 mg daily., Omega 3 fish oil 2 tablets daily, calcium 600 mg daily, OTC Pepcid.    Marland Kitchen aspirin  243 mg Oral Once  . aspirin EC  81 mg Oral Daily  . clopidogrel  75 mg Oral Daily  . diazepam  5 mg Oral On Call  . diphenhydrAMINE  25 mg Oral Once  . enoxaparin (LOVENOX) injection  1 mg/kg Subcutaneous Once  . ezetimibe-simvastatin  1 tablet Oral QHS  . heparin      . lidocaine      . metoprolol succinate  12.5 mg Oral BID  .  nitroGLYCERIN      . omega-3 acid ethyl esters  2 g Oral BID  . oxybutynin  15 mg Oral Daily  . sodium chloride  3 mL Intravenous Q12H  . Tamsulosin HCl  0.4 mg Oral QPM  . DISCONTD: fish oil-omega-3 fatty acids  2 g Oral BID    VITALS: Blood pressure 145/83, pulse 54, temperature 97.9 F (36.6 C), temperature source Oral, resp. rate 16, height 5\' 10"  (1.778 m), weight 77.7 kg (171 lb 4.8 oz), SpO2 93.00%.  PHYSICAL EXAM: General appearance: alert, cooperative, appears stated age and no distress Neck: no adenopathy, no carotid bruit, no JVD, supple, symmetrical, trachea midline, thyroid not enlarged, symmetric, no tenderness/mass/nodules and referred murmur Lungs: clear to auscultation bilaterally, normal percussion bilaterally and non-labored Heart: regularly irregular rhythm, S1, S2 normal, no S3 or S4 and systolic murmur: systolic ejection 2/6, crescendo, decrescendo and harsh at 2nd right intercostal space, radiates to carotids Abdomen: soft, non-tender; bowel sounds normal; no masses,  no organomegaly Extremities: extremities normal, atraumatic, no cyanosis or edema Pulses: 2+ and symmetric Skin: Skin color, texture, turgor normal. No rashes or lesions -- I did not see any residual rash on the L arm. Neurologic: Alert and oriented X 3, normal  strength and tone. Normal symmetric reflexes. Normal coordination and gait, Cranial Nerves II-XII grossly intact.  LABS: Results for orders placed during the hospital encounter of 03/21/11 (from the past 48 hour(s))  CBC     Status: Abnormal   Collection Time   03/21/11  7:43 PM      Component Value Range Comment   WBC 7.4  4.0 - 10.5 (K/uL)    RBC 4.80  4.22 - 5.81 (MIL/uL)    Hemoglobin 15.8  13.0 - 17.0 (g/dL)    HCT 84.6  96.2 - 95.2 (%)    MCV 92.3  78.0 - 100.0 (fL)    MCH 32.9  26.0 - 34.0 (pg)    MCHC 35.7  30.0 - 36.0 (g/dL)    RDW 84.1  32.4 - 40.1 (%)    Platelets 145 (*) 150 - 400 (K/uL)   GLUCOSE, CAPILLARY     Status: Abnormal   Collection Time   03/21/11 11:13 PM      Component Value Range Comment   Glucose-Capillary 116 (*) 70 - 99 (mg/dL)    Comment 1 Notify RN     CARDIAC PANEL(CRET KIN+CKTOT+MB+TROPI)     Status: Abnormal   Collection Time   03/21/11 11:14 PM      Component Value Range Comment   Total CK 190  7 - 232 (U/L)    CK, MB 4.1 (*) 0.3 - 4.0 (ng/mL)    Troponin I <0.30  <0.30 (ng/mL)    Relative Index 2.2  0.0 - 2.5    CARDIAC PANEL(CRET KIN+CKTOT+MB+TROPI)     Status: Normal   Collection Time   03/22/11  5:45 AM      Component Value Range Comment   Total CK 138  7 - 232 (U/L)    CK, MB 3.5  0.3 - 4.0 (ng/mL)    Troponin I <0.30  <0.30 (ng/mL)    Relative Index 2.5  0.0 - 2.5    CBC     Status: Abnormal   Collection Time   03/22/11  5:45 AM      Component Value Range Comment   WBC 6.1  4.0 - 10.5 (K/uL)    RBC 4.66  4.22 - 5.81 (MIL/uL)    Hemoglobin  14.8  13.0 - 17.0 (g/dL)    HCT 45.4  09.8 - 11.9 (%)    MCV 91.6  78.0 - 100.0 (fL)    MCH 31.8  26.0 - 34.0 (pg)    MCHC 34.7  30.0 - 36.0 (g/dL)    RDW 14.7  82.9 - 56.2 (%)    Platelets 147 (*) 150 - 400 (K/uL)   BASIC METABOLIC PANEL     Status: Abnormal   Collection Time   03/22/11  5:45 AM      Component Value Range Comment   Sodium 141  135 - 145 (mEq/L)    Potassium 3.9  3.5 - 5.1  (mEq/L)    Chloride 104  96 - 112 (mEq/L)    CO2 28  19 - 32 (mEq/L)    Glucose, Bld 97  70 - 99 (mg/dL)    BUN 14  6 - 23 (mg/dL)    Creatinine, Ser 1.30  0.50 - 1.35 (mg/dL)    Calcium 9.2  8.4 - 10.5 (mg/dL)    GFR calc non Af Amer 84 (*) >90 (mL/min)    GFR calc Af Amer >90  >90 (mL/min)   LIPID PANEL     Status: Normal   Collection Time   03/22/11  5:45 AM      Component Value Range Comment   Cholesterol 127  0 - 200 (mg/dL)    Triglycerides 865  <150 (mg/dL)    HDL 48  >78 (mg/dL)    Total CHOL/HDL Ratio 2.6      VLDL 27  0 - 40 (mg/dL)    LDL Cholesterol 52  0 - 99 (mg/dL)   PROTIME-INR     Status: Normal   Collection Time   03/22/11  5:45 AM      Component Value Range Comment   Prothrombin Time 14.0  11.6 - 15.2 (seconds)    INR 1.06  0.00 - 1.49    GLUCOSE, CAPILLARY     Status: Normal   Collection Time   03/22/11  6:44 AM      Component Value Range Comment   Glucose-Capillary 97  70 - 99 (mg/dL)    Comment 1 Notify RN       IMAGING: Dg Chest 2 View  03/21/2011  *RADIOLOGY REPORT*  Clinical Data: 78 year old male with chest pain.  CHEST - 2 VIEW  Comparison: 03/16/2011 and earlier.  Findings: Normal lung volumes.  Cardiac size and mediastinal contours are within normal limits.  Visualized tracheal air column is within normal limits.  No pneumothorax, pulmonary edema, pleural effusion or confluent pulmonary opacity. Stable visualized osseous structures.  IMPRESSION: Negative, no acute cardiopulmonary abnormality.  Original Report Authenticated By: Harley Hallmark, M.D.    IMPRESSION: Principal Problem:  *Chest pain Active Problems:  CAD (coronary artery disease), native coronary artery - s/p PCI to RCA  HTN (hypertension)  HLD (hyperlipidemia)  Diabetes mellitus  BPH (benign prostatic hyperplasia)  Mr. Harps now presents with the new symptom of chest pain with an abnormal stress test. A lthough his symptoms seem relatively atypical, with very brief episodes of chest  pain and negative cardiac enzymes, will plan to proceed with cardiac catheterization today as previously planned.  The procedure with Risks/Benefits/Alternatives and Indications was reviewed with the patient.  All questions were answered.    Risks / Complications include, but not limited to: Death, MI, CVA/TIA, VF/VT (with defibrillation), Bradycardia (need for temporary pacer placement), contrast induced nephropathy, bleeding / bruising / hematoma /  pseudoaneurysm, vascular or coronary injury (with possible emergent CT or Vascular Surgery), adverse medication reactions, infection.    The patient voice understanding and agree to proceed.   I have signed the consent form and placed it on the chart for patient signature and RN witness.    RECOMMENDATION:  The plan is for Left Heart Catheterization plus or minus PCI to the right radial access today.   He started on aspirin Plavix which will continue for his known coronary disease.  Continue his Vytorin as Lipitor relatively good on this regimen at baseline.  Hold oral Glucotrol today as he is n.p.o. We'll restart once he is able to eat.  His blood pressure seems well controlled today on his low-dose metoprolol. His blood pressures been well-controlled in the office and therefore we'll not make any further adjustments unless his pressures are elevated here.  Time Spent Directly with Patient: 25 minutes; 20 minutes with chart   Katherin Ramey W, M.D., M.S. [facility] [address] [place], [place]  [address]  [phone]  03/22/2011 9:06 AM

## 2011-03-22 NOTE — Discharge Summary (Signed)
Discharge Summary  Victor [name] MR#: [id number]  DOB:11-26-1931  Date of Admission: 03/21/2011 Date of Discharge: 03/22/2011  Patient's PCP: Shelda Pal, MD, MD  Attending Physician:Ellesse Antenucci  Consults: Cardiology  Discharge Diagnoses: Principal Problem:  *Chest pain Active Problems:  HTN (hypertension)  HLD (hyperlipidemia)  Diabetes mellitus  BPH (benign prostatic hyperplasia)  CAD (coronary artery disease), native coronary artery - s/p PCI to RCA   Brief Admitting History and Physical Victor [name] is a 78 y.o. caucasian male who was scheduled for a cardiac cath in the am. He was starting to cook dinner when he started having chest pain that came and went. Non-radiating and lasting few seconds. Dull in nature. No nausea, no shortness of breath. Current chest pain free.    Discharge Medications Medication List  As of 03/22/2011  5:25 PM   TAKE these medications         aspirin EC 81 MG tablet   Take 81 mg by mouth daily.      Calcium 600+D 600-400 MG-UNIT per tablet   Generic drug: Calcium Carbonate-Vitamin D   Take 1 tablet by mouth daily.      clopidogrel 75 MG tablet   Commonly known as: PLAVIX   Take 75 mg by mouth daily.      ezetimibe-simvastatin 10-40 MG per tablet   Commonly known as: VYTORIN   Take 1 tablet by mouth at bedtime.      fish oil-omega-3 fatty acids 1000 MG capsule   Take 2 g by mouth 2 (two) times daily.      glipiZIDE 5 MG 24 hr tablet   Commonly known as: GLUCOTROL XL   Take 5 mg by mouth daily.      MAGNESIUM PO   Take 1 tablet by mouth daily.      metoprolol succinate 25 MG 24 hr tablet   Commonly known as: TOPROL-XL   Take 12.5 mg by mouth 2 (two) times daily.      mulitivitamin with minerals Tabs   Take 1 tablet by mouth daily.      nitroGLYCERIN 0.4 MG SL tablet   Commonly known as: NITROSTAT   Place 1 tablet (0.4 mg total) under the tongue every 5 (five) minutes x 3 doses as needed for chest pain.      OSTEO BI-FLEX  ADV JOINT SHIELD PO   Take 1 tablet by mouth daily.      OVER THE COUNTER MEDICATION   Take 1 tablet by mouth daily. Cinnamon plus chromium 1000mg       OVER THE COUNTER MEDICATION   Take 1 tablet by mouth daily. Peenut for prostate      oxybutynin 15 MG 24 hr tablet   Commonly known as: DITROPAN XL   Take 15 mg by mouth daily.      Saw Palmetto 450 MG Caps   Take 2 capsules by mouth daily.      Tamsulosin HCl 0.4 MG Caps   Commonly known as: FLOMAX   Take 0.4 mg by mouth every evening.      Vitamin D3 2000 UNITS Tabs   Take 1 tablet by mouth daily.      Zinc 50 MG Caps   Take 50 mg by mouth daily.            Hospital Course: Chest pain- s/p cath with results below  Impression:  Widely patent RCA stent, with mild progression of proximal RCA disease to moderate 50-60% lesion.  Progression of ostial D3 lesion now  60-70% moderate severe -- not optimal for PCI given the ostial nature and that it is a 2.0-2.25 mm vessel -- the concern is impinging upon the main channel LAD.  Mild to moderate Aortic Stenosis. Peak gradient of 20-22 mmHg with a mean gradient of 13 mm mercury.  Well preserved Ejection Fraction of 55-60% with no wall motion abnormalities.  No obvious lesion that would be responsible for the EKG changes noted on treadmill stress test or for resting chest pain that is amenable to PCI. Plan:  Continue to optimize medical therapy. We'll give prescription for when necessary nitroglycerin sublingual  Plan discharge today after was radial cath care. :  .HTN (hypertension)- stable cont meds .HLD (hyperlipidemia)- continue meds .Diabetes mellitus- restart medications .BPH (benign prostatic hyperplasia) .CAD (coronary artery disease), native coronary artery - s/p PCI to RCA    Day of Discharge BP 128/64  Pulse 64  Temp(Src) 98.6 F (37 C) (Oral)  Resp 16  Ht 5\' 10"  (1.778 m)  Wt 77.7 kg (171 lb 4.8 oz)  BMI 24.58 kg/m2  SpO2 95%  Results for orders placed  during the hospital encounter of 03/21/11 (from the past 48 hour(s))  CBC     Status: Abnormal   Collection Time   03/21/11  7:43 PM      Component Value Range Comment   WBC 7.4  4.0 - 10.5 (K/uL)    RBC 4.80  4.22 - 5.81 (MIL/uL)    Hemoglobin 15.8  13.0 - 17.0 (g/dL)    HCT 54.0  98.1 - 19.1 (%)    MCV 92.3  78.0 - 100.0 (fL)    MCH 32.9  26.0 - 34.0 (pg)    MCHC 35.7  30.0 - 36.0 (g/dL)    RDW 47.8  29.5 - 62.1 (%)    Platelets 145 (*) 150 - 400 (K/uL)   GLUCOSE, CAPILLARY     Status: Abnormal   Collection Time   03/21/11 11:13 PM      Component Value Range Comment   Glucose-Capillary 116 (*) 70 - 99 (mg/dL)    Comment 1 Notify RN     CARDIAC PANEL(CRET KIN+CKTOT+MB+TROPI)     Status: Abnormal   Collection Time   03/21/11 11:14 PM      Component Value Range Comment   Total CK 190  7 - 232 (U/L)    CK, MB 4.1 (*) 0.3 - 4.0 (ng/mL)    Troponin I <0.30  <0.30 (ng/mL)    Relative Index 2.2  0.0 - 2.5    CARDIAC PANEL(CRET KIN+CKTOT+MB+TROPI)     Status: Normal   Collection Time   03/22/11  5:45 AM      Component Value Range Comment   Total CK 138  7 - 232 (U/L)    CK, MB 3.5  0.3 - 4.0 (ng/mL)    Troponin I <0.30  <0.30 (ng/mL)    Relative Index 2.5  0.0 - 2.5    CBC     Status: Abnormal   Collection Time   03/22/11  5:45 AM      Component Value Range Comment   WBC 6.1  4.0 - 10.5 (K/uL)    RBC 4.66  4.22 - 5.81 (MIL/uL)    Hemoglobin 14.8  13.0 - 17.0 (g/dL)    HCT 30.8  65.7 - 84.6 (%)    MCV 91.6  78.0 - 100.0 (fL)    MCH 31.8  26.0 - 34.0 (pg)    MCHC 34.7  30.0 -  36.0 (g/dL)    RDW 16.1  09.6 - 04.5 (%)    Platelets 147 (*) 150 - 400 (K/uL)   BASIC METABOLIC PANEL     Status: Abnormal   Collection Time   03/22/11  5:45 AM      Component Value Range Comment   Sodium 141  135 - 145 (mEq/L)    Potassium 3.9  3.5 - 5.1 (mEq/L)    Chloride 104  96 - 112 (mEq/L)    CO2 28  19 - 32 (mEq/L)    Glucose, Bld 97  70 - 99 (mg/dL)    BUN 14  6 - 23 (mg/dL)    Creatinine,  Ser 4.09  0.50 - 1.35 (mg/dL)    Calcium 9.2  8.4 - 10.5 (mg/dL)    GFR calc non Af Amer 84 (*) >90 (mL/min)    GFR calc Af Amer >90  >90 (mL/min)   LIPID PANEL     Status: Normal   Collection Time   03/22/11  5:45 AM      Component Value Range Comment   Cholesterol 127  0 - 200 (mg/dL)    Triglycerides 811  <150 (mg/dL)    HDL 48  >91 (mg/dL)    Total CHOL/HDL Ratio 2.6      VLDL 27  0 - 40 (mg/dL)    LDL Cholesterol 52  0 - 99 (mg/dL)   PROTIME-INR     Status: Normal   Collection Time   03/22/11  5:45 AM      Component Value Range Comment   Prothrombin Time 14.0  11.6 - 15.2 (seconds)    INR 1.06  0.00 - 1.49    GLUCOSE, CAPILLARY     Status: Normal   Collection Time   03/22/11  6:44 AM      Component Value Range Comment   Glucose-Capillary 97  70 - 99 (mg/dL)    Comment 1 Notify RN     GLUCOSE, CAPILLARY     Status: Abnormal   Collection Time   03/22/11 10:44 AM      Component Value Range Comment   Glucose-Capillary 120 (*) 70 - 99 (mg/dL)   GLUCOSE, CAPILLARY     Status: Abnormal   Collection Time   03/22/11  4:27 PM      Component Value Range Comment   Glucose-Capillary 141 (*) 70 - 99 (mg/dL)     Dg Chest 2 View  4/78/2956  *RADIOLOGY REPORT*  Clinical Data: 78 year old male with chest pain.  CHEST - 2 VIEW  Comparison: 03/16/2011 and earlier.  Findings: Normal lung volumes.  Cardiac size and mediastinal contours are within normal limits.  Visualized tracheal air column is within normal limits.  No pneumothorax, pulmonary edema, pleural effusion or confluent pulmonary opacity. Stable visualized osseous structures.  IMPRESSION: Negative, no acute cardiopulmonary abnormality.  Original Report Authenticated By: Harley Hallmark, M.D.   Dg Chest 2 View  03/16/2011  *RADIOLOGY REPORT*  Clinical Data: Preop for cardiac catheterization, diabetes  CHEST - 2 VIEW  Comparison: None.  Findings: The lungs are clear.  No active infiltrate or effusion is seen.  Mediastinal contours appear  normal.  The heart is within normal limits in size.  There are degenerative changes throughout the thoracic spine.  IMPRESSION: No active lung disease.  Original Report Authenticated By: Juline Patch, M.D.     Disposition: home  Diet: cardia/diabetic  Activity: as tolerated   Follow-up Appts: Discharge Orders    Future  Orders Please Complete By Expires   Diet - low sodium heart healthy      Diet Carb Modified      Increase activity slowly          Time spent on discharge, talking to the patient, and coordinating care:37 mins.   SignedMarlin Canary, DO 03/22/2011, 5:25 PM

## 2011-03-22 NOTE — Progress Notes (Signed)
   CARE MANAGEMENT NOTE 03/22/2011  Patient:  Victor [name], Victor [name]   Account Number:  [id number]  Date Initiated:  03/22/2011  Documentation initiated by:  Letha Cape  Subjective/Objective Assessment:   dx chest pain  admit- lives with spouse.  pta independent.     Action/Plan:   continue with progression of care   Anticipated DC Date:  03/23/2011   Anticipated DC Plan:  HOME/SELF CARE      DC Planning Services  CM consult      Choice offered to / List presented to:             Status of service:  In process, will continue to follow Medicare Important Message given?   (If response is "NO", the following Medicare IM given date fields will be blank) Date Medicare IM given:   Date Additional Medicare IM given:    Discharge Disposition:    Per UR Regulation:    Comments:  PCP Dr. Maryjane Hurter  03/22/11 14:12 Letha Cape RN, BSN [phone] patient lives with spouse, pta independent,  patient has medication coverage and transportation.  NCM will continue to follow for dc needs.

## 2011-05-11 ENCOUNTER — Ambulatory Visit: Payer: Self-pay

## 2011-09-14 ENCOUNTER — Emergency Department: Payer: Self-pay | Admitting: Emergency Medicine

## 2011-09-14 LAB — CBC
HCT: 44 % (ref 40.0–52.0)
HGB: 15 g/dL (ref 13.0–18.0)
MCH: 32 pg (ref 26.0–34.0)
MCHC: 34.1 g/dL (ref 32.0–36.0)
MCV: 94 fL (ref 80–100)
Platelet: 147 10*3/uL — ABNORMAL LOW (ref 150–440)
RBC: 4.68 10*6/uL (ref 4.40–5.90)
RDW: 13.4 % (ref 11.5–14.5)
WBC: 5.5 10*3/uL (ref 3.8–10.6)

## 2011-09-14 LAB — COMPREHENSIVE METABOLIC PANEL
Albumin: 3.9 g/dL (ref 3.4–5.0)
Alkaline Phosphatase: 72 U/L (ref 50–136)
Anion Gap: 8 (ref 7–16)
BUN: 22 mg/dL — ABNORMAL HIGH (ref 7–18)
Bilirubin,Total: 0.7 mg/dL (ref 0.2–1.0)
Calcium, Total: 9 mg/dL (ref 8.5–10.1)
Chloride: 105 mmol/L (ref 98–107)
Co2: 28 mmol/L (ref 21–32)
Creatinine: 0.86 mg/dL (ref 0.60–1.30)
EGFR (African American): >60
EGFR (Non-African Amer.): >60
Glucose: 125 mg/dL — ABNORMAL HIGH (ref 65–99)
Osmolality: 286 (ref 275–301)
Potassium: 4 mmol/L (ref 3.5–5.1)
SGOT(AST): 34 U/L (ref 15–37)
SGPT (ALT): 29 U/L (ref 12–78)
Sodium: 141 mmol/L (ref 136–145)
Total Protein: 7.1 g/dL (ref 6.4–8.2)

## 2011-12-26 ENCOUNTER — Ambulatory Visit: Payer: Self-pay | Admitting: Neurology

## 2012-06-28 ENCOUNTER — Encounter: Payer: Self-pay | Admitting: Cardiology

## 2012-06-28 ENCOUNTER — Ambulatory Visit (INDEPENDENT_AMBULATORY_CARE_PROVIDER_SITE_OTHER): Payer: Medicare Other | Admitting: Cardiology

## 2012-06-28 VITALS — BP 110/60 | HR 78 | Ht 69.5 in | Wt 173.8 lb

## 2012-06-28 DIAGNOSIS — I10 Essential (primary) hypertension: Secondary | ICD-10-CM

## 2012-06-28 DIAGNOSIS — I350 Nonrheumatic aortic (valve) stenosis: Secondary | ICD-10-CM

## 2012-06-28 DIAGNOSIS — I359 Nonrheumatic aortic valve disorder, unspecified: Secondary | ICD-10-CM

## 2012-06-28 DIAGNOSIS — I2510 Atherosclerotic heart disease of native coronary artery without angina pectoris: Secondary | ICD-10-CM

## 2012-06-28 DIAGNOSIS — E785 Hyperlipidemia, unspecified: Secondary | ICD-10-CM

## 2012-06-28 NOTE — Patient Instructions (Addendum)
You continue to do quite well from a heart standpoint. I am happy to hear how well you do when exercising. I am glad that you are doing better with the shingles episode.  Foreseeing your cholesterol has been checked by her primary doctor in July.  Your last echocardiogram showed he had some mild narrowing of the aortic valve. Would recheck echocardiogram before I see you back in 6 months.   Marykay Lex, MD  Your physician has requested that you have an echocardiogram. Echocardiography is a painless test that uses sound waves to create images of your heart. It provides your doctor with information about the size and shape of your heart and how well your heart's chambers and valves are working. This procedure takes approximately one hour. There are no restrictions for this procedure.  6 months

## 2012-06-30 ENCOUNTER — Encounter: Payer: Self-pay | Admitting: Cardiology

## 2012-06-30 NOTE — Assessment & Plan Note (Addendum)
Pretty well controlled on Vytorin. He is also on CoQ10 which have helped his cramps. If his cramps persists, would consider switching from Vytorin to a different statin such as Crestor which has a lower side effect profile.Marland Kitchen He is also on Omega 3 Fatty Acids.Followed by primary physician.

## 2012-06-30 NOTE — Assessment & Plan Note (Signed)
Stable with no active Angina or CHF symptoms.   Remains on DAPT, BB & statin.  Unable to add ACE-I or increase BB due to borderline BPs. As it has been many years since his PCI, he should be fine for stopping Plavix for any procedures if need be.

## 2012-06-30 NOTE — Assessment & Plan Note (Signed)
On oral medications. Last A1c to 6.2. Well controlled, monitored by primary physician.

## 2012-06-30 NOTE — Assessment & Plan Note (Signed)
Probably not actually hypertensive as he's been relatively intolerant of other blood pressure medications besides low-dose beta blocker.

## 2012-06-30 NOTE — Progress Notes (Addendum)
Patient ID: Victor [name], male   DOB: 07-Apr-1931, 79 y.o.   MRN: [id number]  Clinic Note: HPI: Victor [name] is a 79 y.o. male with a PMH below who presents today for followup for coronary disease and mild aortic stenosis.  Interval History: [name] continues to do very well. His only major complaints he has occasional balance issues with chronic vertigo. From cardiac standpoint is doing very well with no active issues. He works out on elliptical bout about 20 minutes before times a week and also does weights. This is not doing that he tries to stay active. He is not able to treadmill i walk and is of arthritic pains. Would also give he denies any chest discomfort or shortness of breath with rest or exertion, no PND, orthopnea, or edema. No lightheadedness, dizziness, wooziness, 60 initially. No palpitations. No TIA or amaurosis fugax in his. No melena, hematochezia, hematuria. No claudication symptoms. Next  I last saw him he was getting over cold and this time is actually getting over shingles that he had about a year ago. Next  He does note occasional cramps in the morning when he first wakes up that they've been normal for long time. We talked about the possible stop his Vytorin but is reluctant to do so stating that these don't bother him that much. He does note that a lot of the usual remedies don't seem to help him very much.  Past Medical History  Diagnosis Date  . Coronary artery disease 07/2006    PCI to RCA - Promus DES 2.5 mm x 23 mm; 2D ECHO - EF >55%, moderate calcification of the aortic valve leaflets  . Aortic valve stenosis, moderate 08/2010    Mild to moderate Aortic Stenosis  . Arthritis   . Diabetes mellitus   . High cholesterol   . Hypertension     "from the diabetes"  . Angina   . GERD (gastroesophageal reflux disease)   . BPH (benign prostatic hypertrophy)   . Cerebral atherosclerosis     CAROTID DOPPLER,05/21/2008 - Right and left ICA-0-49% diameter reduction, left  CCA-0-49% diameter reductiion    Prior cardiac evaluation and past surgical history: Past Surgical History  Procedure Laterality Date  . Transurethral resection of prostate  ~ 2010  . Colectomy  ~ 2000  . Cardiac catheterization  03/21/2011    Continue to optimize medical therapy  . Cardiac catheterization with pci  08/27/2006    RCA-mid - 2.5x7mm Promus stent  . Nm myoview ltd  03/09/2011    Post-stress EF 76%, ECG positive for ischemia, normal study    Allergies  Allergen Reactions  . Oraxyl (Doxycycline Hyclate) Swelling    "started swelling in my mouth & tongue; had to go to emergency room; really bad reaction"  . Azithromycin Rash  . Cephalosporins Other (See Comments)    unknown  . Pseudoephedrine Other (See Comments)    unknown  . Amoxicillin Rash  . Codeine Itching and Rash  . Levofloxacin Rash    Current Outpatient Prescriptions  Medication Sig Dispense Refill  . aspirin EC 81 MG tablet Take 81 mg by mouth daily.      . Calcium Carbonate-Vitamin D (CALCIUM 600+D) 600-400 MG-UNIT per tablet Take 1 tablet by mouth daily.      . Cholecalciferol (VITAMIN D3) 2000 UNITS TABS Take 1 tablet by mouth daily.      . clopidogrel (PLAVIX) 75 MG tablet Take 75 mg by mouth daily.      Marland Kitchen  Coenzyme Q10 (CO Q 10 PO) Take by mouth daily.      Marland Kitchen ezetimibe-simvastatin (VYTORIN) 10-40 MG per tablet Take 1 tablet by mouth at bedtime.      . Famotidine (PEPCID PO) Take by mouth as needed.      . fish oil-omega-3 fatty acids 1000 MG capsule Take 2 g by mouth 2 (two) times daily.       Marland Kitchen glipiZIDE (GLUCOTROL XL) 5 MG 24 hr tablet Take 5 mg by mouth daily.      Marland Kitchen LORazepam (ATIVAN) 0.5 MG tablet Take 0.5 mg by mouth as needed for anxiety.      Marland Kitchen MAGNESIUM PO Take 1 tablet by mouth daily.      . metoprolol tartrate (LOPRESSOR) 25 MG tablet Take 12.5 mg by mouth 2 (two) times daily.      . Misc Natural Products (OSTEO BI-FLEX ADV JOINT SHIELD PO) Take 1 tablet by mouth daily.      . Multiple  Vitamin (MULITIVITAMIN WITH MINERALS) TABS Take 1 tablet by mouth daily.      Marland Kitchen OVER THE COUNTER MEDICATION Take 1 tablet by mouth daily. Cinnamon plus chromium 1000mg       . OVER THE COUNTER MEDICATION Take 1 tablet by mouth daily. Peenut for prostate      . oxybutynin (DITROPAN XL) 15 MG 24 hr tablet Take 15 mg by mouth daily.      . Saw Palmetto 450 MG CAPS Take 2 capsules by mouth daily.      . Tamsulosin HCl (FLOMAX) 0.4 MG CAPS Take 0.4 mg by mouth every evening.      . Zinc 50 MG CAPS Take 50 mg by mouth daily.      . nitroGLYCERIN (NITROSTAT) 0.4 MG SL tablet Place 1 tablet (0.4 mg total) under the tongue every 5 (five) minutes x 3 doses as needed for chest pain.  20 tablet  0   No current facility-administered medications for this visit.    History   Social History  . Marital Status: Married    Spouse Name: N/A    Number of Children: N/A  . Years of Education: N/A   Occupational History  . Not on file.   Social History Main Topics  . Smoking status: Never Smoker   . Smokeless tobacco: Former Neurosurgeon    Types: Chew    Quit date: 01/31/1996  . Alcohol Use: 3.6 oz/week    6 Glasses of wine per week  . Drug Use: No  . Sexually Active: Yes   Other Topics Concern  . Not on file   Social History Narrative   Father of 2, grandfather 5.   Exercise for almost 2 hours a day, doing least 20 minutes on the elliptical trainer. He does his to 4 days a week. He'll also does weights and stretching exercises.    ROS: A comprehensive Review of Systems - Negative except Pertinent positives and negatives noted above.  PHYSICAL EXAM BP 110/60  Pulse 78  Ht 5' 9.5" (1.765 m)  Wt 173 lb 12.8 oz (78.835 kg)  BMI 25.31 kg/m2 General appearance: alert, cooperative, appears stated age, no distress and Pleasant mood and affect Neck: no carotid bruit and no JVD Lungs: clear to auscultation bilaterally, normal percussion bilaterally and Nonlabored, normal excursion. Heart: regular rate and  rhythm, S1, S2 normal, no S3 or S4, systolic murmur: systolic ejection 2/6, crescendo, decrescendo, harsh and Mid-peaking mid peaking at 2nd right intercostal space, radiates to carotids and  no rub Abdomen: soft, non-tender; bowel sounds normal; no masses,  no organomegaly Extremities: extremities normal, atraumatic, no cyanosis or edema Pulses: 2+ and symmetric Neurologic: Grossly normal; HEENT: [place]/AT, EOMI, MMM, anicteric sclera  [id number] today: Yes Rate: 78 , Rhythm: Normal sinus; otherwise normal ECG  ASSESSMENT: Overall stable from a cardiac standpoint. No active symptoms.  Patient Active Problem List   Diagnosis Date Noted  . CAD (coronary artery disease), native coronary artery - s/p PCI to RCA 08/19/2006    Priority: High    Class: History of  . HTN (hypertension) 03/21/2011    Priority: Low  . Chest pain 03/21/2011  . HLD (hyperlipidemia) 03/21/2011  . Diabetes mellitus type II, controlled 03/21/2011  . BPH (benign prostatic hyperplasia) 03/21/2011  . Aortic valve stenosis, moderate 08/31/2010    CAD (coronary artery disease), native coronary artery - Plan: EKG 12-Lead  HTN (hypertension) - Plan: EKG 12-Lead  Aortic valve stenosis, moderate - Plan: 2D Echocardiogram with contrast  HLD (hyperlipidemia)  PLAN: Per problem list. Orders Placed This Encounter  Procedures  . EKG 12-Lead  . 2D Echocardiogram with contrast    Standing Status: Future     Number of Occurrences:      Standing Expiration Date: 06/28/2013    Scheduling Instructions:     Plan for December 2014    Order Specific Question:  Type of Echo    Answer:  Complete    Order Specific Question:  Where should this test be performed    Answer:  [facility]    Order Specific Question:  Reason for exam-Echo    Answer:  Aortic Valve Disorder 424.1   No orders of the defined types were placed in this encounter.   He is due to get his labs followed up with primary physician in July of this  year. Followup: 6 months  HARDING,DAVID W, M.D., M.S. [facility] [address] [place], [place]  [address]  [phone] Pager # [phone] 06/30/2012 11:33 PM

## 2012-06-30 NOTE — Assessment & Plan Note (Signed)
Last echo was in Aug 2012, recheck Echo prior to follow up appointment in ~6 monhts. If stable, would follow-up every ~2 years.

## 2012-07-13 ENCOUNTER — Encounter: Payer: Self-pay | Admitting: Cardiology

## 2012-08-03 ENCOUNTER — Emergency Department: Payer: Self-pay | Admitting: Emergency Medicine

## 2012-09-05 ENCOUNTER — Ambulatory Visit: Payer: Self-pay | Admitting: Unknown Physician Specialty

## 2012-09-09 LAB — PATHOLOGY REPORT

## 2012-11-01 ENCOUNTER — Emergency Department: Payer: Self-pay | Admitting: Emergency Medicine

## 2012-12-05 ENCOUNTER — Ambulatory Visit (HOSPITAL_COMMUNITY)
Admission: RE | Admit: 2012-12-05 | Discharge: 2012-12-05 | Disposition: A | Discharge status: Home / Self Care | Payer: Medicare Other | Source: Ambulatory Visit | Attending: Cardiovascular Disease | Admitting: Cardiovascular Disease

## 2012-12-05 ENCOUNTER — Other Ambulatory Visit (HOSPITAL_COMMUNITY): Payer: Self-pay | Admitting: Cardiology

## 2012-12-05 DIAGNOSIS — I25810 Atherosclerosis of coronary artery bypass graft(s) without angina pectoris: Secondary | ICD-10-CM | POA: Insufficient documentation

## 2012-12-05 DIAGNOSIS — I359 Nonrheumatic aortic valve disorder, unspecified: Secondary | ICD-10-CM

## 2012-12-05 NOTE — Progress Notes (Signed)
2D Echo Performed 02/15/2012    Yvette Roark, RCS  

## 2012-12-05 NOTE — Progress Notes (Signed)
2D Echo Performed 12/05/2012    Clearence Ped, RCS

## 2012-12-11 ENCOUNTER — Telehealth: Payer: Self-pay | Admitting: *Deleted

## 2012-12-11 NOTE — Telephone Encounter (Signed)
Message copied by Tobin Chad on Wed Dec 11, 2012  8:50 AM ------      Message from: Herbie Baltimore, DAVID W      Created: Fri Dec 06, 2012  6:04 PM       Stable Aortic Valve -- no notable change. Still mild-Moderate stenosis.            Marykay Lex, MD            [facility]!! ------

## 2012-12-11 NOTE — Telephone Encounter (Signed)
Spoke to patient. Result given . Verbalized understanding  

## 2012-12-18 ENCOUNTER — Encounter: Payer: Self-pay | Admitting: Cardiology

## 2012-12-23 ENCOUNTER — Encounter: Payer: Self-pay | Admitting: Cardiology

## 2012-12-23 ENCOUNTER — Ambulatory Visit (INDEPENDENT_AMBULATORY_CARE_PROVIDER_SITE_OTHER): Payer: Medicare Other | Admitting: Cardiology

## 2012-12-23 VITALS — BP 130/62 | HR 73 | Ht 70.0 in | Wt 173.5 lb

## 2012-12-23 DIAGNOSIS — E785 Hyperlipidemia, unspecified: Secondary | ICD-10-CM

## 2012-12-23 DIAGNOSIS — I350 Nonrheumatic aortic (valve) stenosis: Secondary | ICD-10-CM

## 2012-12-23 DIAGNOSIS — I359 Nonrheumatic aortic valve disorder, unspecified: Secondary | ICD-10-CM

## 2012-12-23 DIAGNOSIS — I10 Essential (primary) hypertension: Secondary | ICD-10-CM

## 2012-12-23 DIAGNOSIS — Z9861 Coronary angioplasty status: Secondary | ICD-10-CM

## 2012-12-23 DIAGNOSIS — I2510 Atherosclerotic heart disease of native coronary artery without angina pectoris: Secondary | ICD-10-CM

## 2012-12-23 NOTE — Progress Notes (Signed)
Patient ID: Victor [name], male   DOB: [birth year], 79 y.o.   MRN: [id number]  Clinic Note: HPI: Victor [name] is a 79 y.o. male with a PMH below who presents today for followup for coronary disease and moderate aortic stenosis. He is actually here to followup from his recent echocardiogram done earlier this month. It basically showed stable moderate aortic stenosis as noted below.  Interval History: Mr. Victor [name] continues to do very well, and only occasionally noting his chronic vertigo. From cardiac standpoint is doing very well with no active issues. He works out on elliptical about 20 minutes before times a week and also does weights. This is not doing that he tries to stay active. He is not able to treadmill mostly because of his of  pains. He denies any chest discomfort or shortness of breath with rest or exertion, no PND, orthopnea, or edema. No lightheadedness, dizziness, wooziness, syncope/near-syncope. No palpitations. No TIA or amaurosis fugax in his. No melena, hematochezia, hematuria. No claudication symptoms. Next  I last saw him he was getting over cold and this time is actually getting over shingles that he had about a year ago. Next  He does note occasional cramps in the morning when he first wakes up that they've been normal for long time. He says that these are much improved with using quinine/tonic water.  Past Medical History  Diagnosis Date  . CAD S/P percutaneous coronary angioplasty 07/2006    PCI to RCA - Promus DES 2.5 mm x 23 mm; 2D ECHO - EF >55%, moderate calcification of the aortic valve leaflets  . Aortic valve stenosis, moderate 08/2010; 12/05/2012    Mild to moderate Aortic Stenosis;; Stable in 2014 --> EF 55-60%, Mod AoV Calcification with mild-moderate stenosis; mild MR. (Stable)  . Arthritis   . Diabetes mellitus   . High cholesterol   . Hypertension     "from the diabetes"  . Angina   . GERD (gastroesophageal reflux disease)   . BPH (benign prostatic hypertrophy)    . Bilateral carotid artery disease     CAROTID DOPPLER,05/21/2008 - Right and left ICA-0-49% diameter reduction, left CCA-0-49% diameter reductiion    Prior cardiac evaluation and past surgical history: Past Surgical History  Procedure Laterality Date  . Transurethral resection of prostate  ~ 2010  . Colectomy  ~ 2000  . Cardiac catheterization  03/21/2011    Continue to optimize medical therapy  . Cardiac catheterization with pci  08/27/2006    RCA-mid - 2.5x28mm Promus stent  . Nm myoview ltd  03/09/2011    Post-stress EF 76%, ECG positive for ischemia, normal study    Allergies  Allergen Reactions  . Oraxyl [Doxycycline Hyclate] Swelling    "started swelling in my mouth & tongue; had to go to emergency room; really bad reaction"  . Azithromycin Rash  . Cephalosporins Other (See Comments)    unknown  . Pseudoephedrine Other (See Comments)    unknown  . Amoxicillin Rash  . Codeine Itching and Rash  . Levofloxacin Rash    Current Outpatient Prescriptions  Medication Sig Dispense Refill  . aspirin EC 81 MG tablet Take 81 mg by mouth daily.      . Calcium Carbonate-Vitamin D (CALCIUM 600+D) 600-400 MG-UNIT per tablet Take 1 tablet by mouth daily.      . Cholecalciferol (VITAMIN D3) 2000 UNITS TABS Take 1 tablet by mouth daily.      . clopidogrel (PLAVIX) 75 MG tablet Take 75 mg by mouth  daily.      . Coenzyme Q10 (CO Q 10 PO) Take by mouth daily.      Marland Kitchen donepezil (ARICEPT) 10 MG tablet Take 10 mg by mouth daily.      Marland Kitchen ezetimibe-simvastatin (VYTORIN) 10-40 MG per tablet Take 1 tablet by mouth at bedtime.      . fish oil-omega-3 fatty acids 1000 MG capsule Take 2 g by mouth 2 (two) times daily.       Marland Kitchen glipiZIDE (GLUCOTROL XL) 5 MG 24 hr tablet Take 5 mg by mouth daily.      . lansoprazole (PREVACID) 30 MG capsule Take 30 mg by mouth daily.      Marland Kitchen LORazepam (ATIVAN) 0.5 MG tablet Take 0.5 mg by mouth as needed for anxiety.      Marland Kitchen MAGNESIUM PO Take 1 tablet by mouth daily.       . metoprolol tartrate (LOPRESSOR) 25 MG tablet Take 12.5 mg by mouth 2 (two) times daily.      . Misc Natural Products (OSTEO BI-FLEX ADV JOINT SHIELD PO) Take 1 tablet by mouth daily.      . Multiple Vitamin (MULITIVITAMIN WITH MINERALS) TABS Take 1 tablet by mouth daily.      . nitroGLYCERIN (NITROSTAT) 0.4 MG SL tablet Place 1 tablet (0.4 mg total) under the tongue every 5 (five) minutes x 3 doses as needed for chest pain.  20 tablet  0  . OVER THE COUNTER MEDICATION Take 1 tablet by mouth daily. Cinnamon plus chromium 1000mg       . OVER THE COUNTER MEDICATION Take 1 tablet by mouth daily. Peenut for prostate      . oxybutynin (DITROPAN XL) 15 MG 24 hr tablet Take 15 mg by mouth daily.      . Saw Palmetto 450 MG CAPS Take 2 capsules by mouth daily.      . Tamsulosin HCl (FLOMAX) 0.4 MG CAPS Take 0.4 mg by mouth every evening.      . Zinc 50 MG CAPS Take 50 mg by mouth daily.       No current facility-administered medications for this visit.    History   Social History  . Marital Status: Married    Spouse Name: N/A    Number of Children: N/A  . Years of Education: N/A   Occupational History  . Not on file.   Social History Main Topics  . Smoking status: Never Smoker   . Smokeless tobacco: Former Neurosurgeon    Types: Chew    Quit date: 01/31/1996  . Alcohol Use: 3.6 oz/week    6 Glasses of wine per week  . Drug Use: No  . Sexual Activity: Yes   Other Topics Concern  . Not on file   Social History Narrative   Father of 2, grandfather 5.   Exercise for almost 2 hours a day, doing least 20 minutes on the elliptical trainer. He does his to 4 days a week. He'll also does weights and stretching exercises.    ROS: A comprehensive Review of Systems - Negative except Pertinent positives and negatives noted above.  PHYSICAL EXAM BP 130/62  Pulse 73  Ht 5\' 10"  (1.778 m)  Wt 173 lb 8 oz (78.699 kg)  BMI 24.89 kg/m2 General appearance: alert, cooperative, appears stated age, no  distress and Pleasant mood and affect Neck: no carotid bruit and no JVD Lungs: clear to auscultation bilaterally, normal percussion bilaterally and Nonlabored, normal excursion. Heart: regular rate and rhythm, S1,  S2 normal, no S3 or S4, systolic murmur: systolic ejection 2/6, crescendo, decrescendo, harsh and Mid-peaking mid peaking at 2nd right intercostal space, radiates to carotids and no rub Abdomen: soft, non-tender; bowel sounds normal; no masses,  no organomegaly Extremities: extremities normal, atraumatic, no cyanosis or edema Pulses: 2+ and symmetric Neurologic: Grossly normal; HEENT: [place]/AT, EOMI, MMM, anicteric sclera  [id number] today: Yes Rate: 73, Rhythm: Normal sinus; otherwise normal ECG  ASSESSMENT/PLAN: Overall stable from a cardiac standpoint. No active symptoms.  CAD S/P percutaneous coronary angioplasty --> PCI RCA Promus DES 2.5 mm x 23 mm Stable on current regimen with aspirin plus Plavix. He has a long stent, I would like to keep him on at least Plavix. Stop aspirin if need be. He is on statin as well as beta blocker. Previously did not use ACE inhibitors due to blood pressure concerns. With a relatively normal EF we are fine without ACE inhibitor or ARB.  With his routine activity the is doing a weekly basis. I simply told him is no need to do any followup stress tests. He has the inability to do his routine activity then I think we would want to investigate. Otherwise I would not do a stress test.  HTN (hypertension) Stable on low-dose beta blocker. See above  Aortic valve stenosis, moderate Stable from 2012 to current. I explained to the patient this is due to normal wear and tear of the valve. The back it is stable over 2 years is reassuring. We'll do the followup with an echocardiogram 2 years. The treatment is essentially same as risk factor modification and treatment for CAD. I reviewed with the patient the concerning symptoms of angina, heart failure and  syncope as being concerning symptoms for aortic stenosis.  HLD (hyperlipidemia) Followed by PCP. Currently on Vytorin and doing relatively well. He is taking co-Q10 for assistance. This is helped. I don't has cancer think that his cramps are related to the statin. Unfortunately do not have his most recent labs.   Orders Placed This Encounter  Procedures  . EKG 12-Lead   Followup: 6 months with PA/NP. One year with me  Marykay Lex, M.D., M.S. [facility] [address] [place], [place]  [address]  [phone] Pager # [phone] 12/25/2012 5:14 PM

## 2012-12-23 NOTE — Patient Instructions (Addendum)
Your echocardiogram results look great. The aortic valve is mild to moderately narrowed (stenosis); however this has remained stable for 2 years now. As I mentioned this is from normal wear and tear of the valve. Versus calcium buildup in the damaged valve, the same disease process as her heart artery disease. We treated the same way with cholesterol, blood per pressure management.  The fact that you are exercising as routinely as you are is a great sign testing are stable from our perspective. As long as you're doing significant activity without any chest tightness, pressure or shortness of breath, I think we are fine without doing a stress test.  Am not planning on beginning medication changes.  All having followup with my physician's assistants in 6 months, and then with me in a year.  Marykay Lex, M.D., M.S. [facility] [address] [place], [place]  [address]  [phone]

## 2012-12-25 ENCOUNTER — Encounter: Payer: Self-pay | Admitting: Cardiology

## 2012-12-25 NOTE — Assessment & Plan Note (Addendum)
Followed by PCP. Currently on Vytorin and doing relatively well. He is taking co-Q10 for assistance. This is helped. I don't has cancer think that his cramps are related to the statin. Unfortunately do not have his most recent labs.

## 2012-12-25 NOTE — Assessment & Plan Note (Addendum)
Stable on current regimen with aspirin plus Plavix. He has a long stent, I would like to keep him on at least Plavix. Stop aspirin if need be. He is on statin as well as beta blocker. Previously did not use ACE inhibitors due to blood pressure concerns. With a relatively normal EF we are fine without ACE inhibitor or ARB.  With his routine activity the is doing a weekly basis. I simply told him is no need to do any followup stress tests. He has the inability to do his routine activity then I think we would want to investigate. Otherwise I would not do a stress test.

## 2012-12-25 NOTE — Assessment & Plan Note (Signed)
Stable on low-dose beta blocker. See above

## 2012-12-25 NOTE — Assessment & Plan Note (Addendum)
Stable from 2012 to current. I explained to the patient this is due to normal wear and tear of the valve. The back it is stable over 2 years is reassuring. We'll do the followup with an echocardiogram 2 years. The treatment is essentially same as risk factor modification and treatment for CAD. I reviewed with the patient the concerning symptoms of angina, heart failure and syncope as being concerning symptoms for aortic stenosis.

## 2013-02-07 ENCOUNTER — Emergency Department: Payer: Self-pay | Admitting: Emergency Medicine

## 2013-02-07 LAB — COMPREHENSIVE METABOLIC PANEL
Albumin: 4.1 g/dL (ref 3.4–5.0)
Alkaline Phosphatase: 75 U/L
Anion Gap: 3 — ABNORMAL LOW (ref 7–16)
BUN: 14 mg/dL (ref 7–18)
Bilirubin,Total: 0.7 mg/dL (ref 0.2–1.0)
Calcium, Total: 9.3 mg/dL (ref 8.5–10.1)
Chloride: 102 mmol/L (ref 98–107)
Co2: 32 mmol/L (ref 21–32)
Creatinine: 1.02 mg/dL (ref 0.60–1.30)
EGFR (African American): >60
EGFR (Non-African Amer.): >60
Glucose: 113 mg/dL — ABNORMAL HIGH (ref 65–99)
Osmolality: 275 (ref 275–301)
Potassium: 4 mmol/L (ref 3.5–5.1)
SGOT(AST): 24 U/L (ref 15–37)
SGPT (ALT): 34 U/L (ref 12–78)
Sodium: 137 mmol/L (ref 136–145)
Total Protein: 7 g/dL (ref 6.4–8.2)

## 2013-02-07 LAB — CBC
HCT: 42.8 % (ref 40.0–52.0)
HGB: 14.9 g/dL (ref 13.0–18.0)
MCH: 32.1 pg (ref 26.0–34.0)
MCHC: 34.8 g/dL (ref 32.0–36.0)
MCV: 92 fL (ref 80–100)
Platelet: 167 10*3/uL (ref 150–440)
RBC: 4.63 10*6/uL (ref 4.40–5.90)
RDW: 13.4 % (ref 11.5–14.5)
WBC: 5.6 10*3/uL (ref 3.8–10.6)

## 2013-02-21 DIAGNOSIS — J309 Allergic rhinitis, unspecified: Secondary | ICD-10-CM | POA: Insufficient documentation

## 2013-02-21 DIAGNOSIS — L509 Urticaria, unspecified: Secondary | ICD-10-CM | POA: Insufficient documentation

## 2013-05-06 DIAGNOSIS — K219 Gastro-esophageal reflux disease without esophagitis: Secondary | ICD-10-CM | POA: Insufficient documentation

## 2013-06-09 ENCOUNTER — Ambulatory Visit (INDEPENDENT_AMBULATORY_CARE_PROVIDER_SITE_OTHER): Payer: Medicare Other | Admitting: Physician Assistant

## 2013-06-09 ENCOUNTER — Encounter: Payer: Self-pay | Admitting: Physician Assistant

## 2013-06-09 VITALS — BP 110/70 | HR 64 | Ht 70.0 in | Wt 166.0 lb

## 2013-06-09 DIAGNOSIS — Z9861 Coronary angioplasty status: Secondary | ICD-10-CM

## 2013-06-09 DIAGNOSIS — I10 Essential (primary) hypertension: Secondary | ICD-10-CM

## 2013-06-09 DIAGNOSIS — I350 Nonrheumatic aortic (valve) stenosis: Secondary | ICD-10-CM

## 2013-06-09 DIAGNOSIS — I2510 Atherosclerotic heart disease of native coronary artery without angina pectoris: Secondary | ICD-10-CM

## 2013-06-09 DIAGNOSIS — E785 Hyperlipidemia, unspecified: Secondary | ICD-10-CM

## 2013-06-09 DIAGNOSIS — I359 Nonrheumatic aortic valve disorder, unspecified: Secondary | ICD-10-CM

## 2013-06-09 NOTE — Assessment & Plan Note (Signed)
Last 2-D echocardiogram was November 2014. Patient is currently asymptomatic. We'll continue to monitor.

## 2013-06-09 NOTE — Assessment & Plan Note (Signed)
Blood pressure well controlled on current medical therapy

## 2013-06-09 NOTE — Assessment & Plan Note (Addendum)
Patient exercises regularly up to an hour and half day using the elliptical trainer and other forms of exercise. He reports no angina symptoms.  He is on Plavix

## 2013-06-09 NOTE — Assessment & Plan Note (Signed)
Treatment Vytorin

## 2013-06-09 NOTE — Progress Notes (Signed)
Date:  06/09/2013   ID:  Victor [name], DOB 12/30/1931, MRN [id number]  PCP:  Victor Hartigan, MD  Primary Cardiologist:  [name]     History of Present Illness: Victor [name] is a 80 y.o. male Victor [name] is a 80 y.o. male with a PMH below who presents today for followup for coronary disease and moderate aortic stenosis.  Patient reports doing quite well he continues to exercise approximately 3 times per week up to 1.5 hours each time. It is. She uses the elliptical trainer for approximately 25 minutes. Reports no symptoms of angina or shortness of breath.   Based on weight recording he's decreased from 173 pounds to 166.  The patient currently denies nausea, vomiting, fever, orthopnea, dizziness, PND, cough, congestion, abdominal pain, hematochezia, melena, lower extremity edema, claudication.  Wt Readings from Last 3 Encounters:  06/09/13 166 lb (75.297 kg)  12/23/12 173 lb 8 oz (78.699 kg)  06/28/12 173 lb 12.8 oz (78.835 kg)     Past Medical History  Diagnosis Date  . CAD S/P percutaneous coronary angioplasty 07/2006    PCI to RCA - Promus DES 2.5 mm x 23 mm; 2D ECHO - EF >55%, moderate calcification of the aortic valve leaflets  . Aortic valve stenosis, moderate 08/2010; 12/05/2012    Mild to moderate Aortic Stenosis;; Stable in 2014 --> EF 55-60%, Mod AoV Calcification with mild-moderate stenosis; mild MR. (Stable)  . Arthritis   . Diabetes mellitus   . High cholesterol   . Hypertension     "from the diabetes"  . Angina   . GERD (gastroesophageal reflux disease)   . BPH (benign prostatic hypertrophy)   . Bilateral carotid artery disease     CAROTID DOPPLER,05/21/2008 - Right and left ICA-0-49% diameter reduction, left CCA-0-49% diameter reductiion    Current Outpatient Prescriptions  Medication Sig Dispense Refill  . cetirizine (ZYRTEC) 10 MG tablet Take 10 mg by mouth daily.      Marland Kitchen aspirin EC 81 MG tablet Take 81 mg by mouth daily.      . Calcium  Carbonate-Vitamin D (CALCIUM 600+D) 600-400 MG-UNIT per tablet Take 1 tablet by mouth daily.      . Cholecalciferol (VITAMIN D3) 2000 UNITS TABS Take 1 tablet by mouth daily.      . clopidogrel (PLAVIX) 75 MG tablet Take 75 mg by mouth daily.      . Coenzyme Q10 (CO Q 10 PO) Take by mouth daily.      Marland Kitchen donepezil (ARICEPT) 10 MG tablet Take 10 mg by mouth daily.      Marland Kitchen ezetimibe-simvastatin (VYTORIN) 10-40 MG per tablet Take 1 tablet by mouth at bedtime.      . fish oil-omega-3 fatty acids 1000 MG capsule Take 2 g by mouth 2 (two) times daily.       Marland Kitchen glipiZIDE (GLUCOTROL XL) 5 MG 24 hr tablet Take 5 mg by mouth daily.      . lansoprazole (PREVACID) 30 MG capsule Take 30 mg by mouth daily.      Marland Kitchen LORazepam (ATIVAN) 0.5 MG tablet Take 0.5 mg by mouth as needed for anxiety.      Marland Kitchen MAGNESIUM PO Take 1 tablet by mouth daily.      . metoprolol tartrate (LOPRESSOR) 25 MG tablet Take 12.5 mg by mouth 2 (two) times daily.      . Misc Natural Products (OSTEO BI-FLEX ADV JOINT SHIELD PO) Take 1 tablet by mouth daily.      Marland Kitchen  Multiple Vitamin (MULITIVITAMIN WITH MINERALS) TABS Take 1 tablet by mouth daily.      . nitroGLYCERIN (NITROSTAT) 0.4 MG SL tablet Place 1 tablet (0.4 mg total) under the tongue every 5 (five) minutes x 3 doses as needed for chest pain.  20 tablet  0  . OVER THE COUNTER MEDICATION Take 1 tablet by mouth daily. Cinnamon plus chromium 1000mg       . OVER THE COUNTER MEDICATION Take 1 tablet by mouth daily. Peenut for prostate      . oxybutynin (DITROPAN XL) 15 MG 24 hr tablet Take 15 mg by mouth daily.      . Saw Palmetto 450 MG CAPS Take 2 capsules by mouth daily.      . Tamsulosin HCl (FLOMAX) 0.4 MG CAPS Take 0.4 mg by mouth every evening.      . Zinc 50 MG CAPS Take 50 mg by mouth daily.       No current facility-administered medications for this visit.    Allergies:    Allergies  Allergen Reactions  . Oraxyl [Doxycycline Hyclate] Swelling    "started swelling in my mouth &  tongue; had to go to emergency room; really bad reaction"  . Azithromycin Rash  . Cephalosporins Other (See Comments)    unknown  . Pseudoephedrine Other (See Comments)    unknown  . Amoxicillin Rash  . Codeine Itching and Rash  . Levofloxacin Rash    Social History:  The patient  reports that he has never smoked. He quit smokeless tobacco use about 17 years ago. His smokeless tobacco use included Chew. He reports that he drinks about 3.6 ounces of alcohol per week. He reports that he does not use illicit drugs.   Family history:  History reviewed. No pertinent family history.  ROS:  Please see the history of present illness.  All other systems reviewed and negative.   PHYSICAL EXAM: VS:  BP 110/70  Pulse 64  Ht 5\' 10"  (1.778 m)  Wt 166 lb (75.297 kg)  BMI 23.82 kg/m2 Well nourished, well developed, in no acute distress HEENT: Pupils are equal round react to light accommodation extraocular movements are intact.  Neck: no JVDNo cervical lymphadenopathy. Cardiac : regularly irregular with a 2/6 systolicmurmur. Lungs:  clear to auscultation bilaterally, no wheezing, rhonchi or rales Abd: soft, nontender, positive bowel sounds all quadrants, no hepatosplenomegaly Ext: no lower extremity edema.  2+ radial and dorsalis pedis pulses. Skin: warm and dry Neuro:  Grossly normal  EKG:  SR 64.  PVCs  ASSESSMENT AND PLAN:  Problem List Items Addressed This Visit   HTN (hypertension) (Chronic)     Blood pressure well controlled on current medical therapy    HLD (hyperlipidemia) (Chronic)     Treatment Vytorin    Aortic valve stenosis, moderate (Chronic)     Last 2-D echocardiogram was November 2014. Patient is currently asymptomatic. We'll continue to monitor.    CAD S/P percutaneous coronary angioplasty --> PCI RCA Promus DES 2.5 mm x 23 mm (Chronic)     Patient exercises regularly up to an hour and half day using the elliptical trainer and other forms of exercise. He reports no  angina symptoms.  He is on Plavix     Other Visit Diagnoses   CAD (coronary artery disease)    -  Primary    Relevant Orders       EKG 12-Lead

## 2013-06-09 NOTE — Patient Instructions (Signed)
1.  Follow up with Dr. [name] in 6 months. 2.  Keep up the exercise.

## 2013-07-14 DIAGNOSIS — R413 Other amnesia: Secondary | ICD-10-CM | POA: Insufficient documentation

## 2013-08-19 DIAGNOSIS — G2581 Restless legs syndrome: Secondary | ICD-10-CM | POA: Insufficient documentation

## 2013-12-29 ENCOUNTER — Ambulatory Visit: Payer: PRIVATE HEALTH INSURANCE | Admitting: Cardiology

## 2014-01-08 ENCOUNTER — Encounter (HOSPITAL_COMMUNITY): Payer: Self-pay | Admitting: Cardiology

## 2014-03-09 ENCOUNTER — Ambulatory Visit: Payer: PRIVATE HEALTH INSURANCE | Admitting: Cardiology

## 2014-05-12 ENCOUNTER — Ambulatory Visit
Admit: 2014-05-12 | Disposition: A | Discharge status: Home / Self Care | Payer: Self-pay | Attending: Ophthalmology | Admitting: Ophthalmology

## 2014-05-18 ENCOUNTER — Ambulatory Visit (INDEPENDENT_AMBULATORY_CARE_PROVIDER_SITE_OTHER): Payer: Medicare Other | Admitting: Cardiology

## 2014-05-18 ENCOUNTER — Encounter: Payer: Self-pay | Admitting: Cardiology

## 2014-05-18 VITALS — BP 112/66 | HR 69 | Ht 69.0 in | Wt 172.1 lb

## 2014-05-18 DIAGNOSIS — E119 Type 2 diabetes mellitus without complications: Secondary | ICD-10-CM

## 2014-05-18 DIAGNOSIS — I350 Nonrheumatic aortic (valve) stenosis: Secondary | ICD-10-CM

## 2014-05-18 DIAGNOSIS — I10 Essential (primary) hypertension: Secondary | ICD-10-CM

## 2014-05-18 DIAGNOSIS — Z9861 Coronary angioplasty status: Secondary | ICD-10-CM

## 2014-05-18 DIAGNOSIS — E785 Hyperlipidemia, unspecified: Secondary | ICD-10-CM

## 2014-05-18 DIAGNOSIS — I2510 Atherosclerotic heart disease of native coronary artery without angina pectoris: Secondary | ICD-10-CM | POA: Diagnosis not present

## 2014-05-18 NOTE — Progress Notes (Signed)
PCP: Sofie Hartigan, MD  linic Note: Chief Complaint  Patient presents with  . 11 MONTH VISIT    LAST VISIT WITH DR Jaivion Kingsley 11/2012, NO CHEST , NO SOB , NO SWELLING, LAST SET OF LABS WERE IN 2015  . Aortic Stenosis  . Coronary Artery Disease  . Hypertension  . Hyperlipidemia    HPI: Victor [name] is a 81 y.o. male with a PMH below who presents today for > 1 yr follow-up for coronary disease and moderate aortic stenosis. Last Echo Nov 2014.Merlyn Albert - May 2015. -- Patient exercises regularly ~ 3 days /week up to an hour and half day using the elliptical trainer and other forms of exercise. He reports no angina symptoms. He is on Plavix   Past Medical History  Diagnosis Date  . CAD S/P percutaneous coronary angioplasty 07/2006    PCI to RCA - Promus DES 2.5 mm x 23 mm; 2D ECHO - EF >55%, moderate calcification of the aortic valve leaflets  . Aortic valve stenosis, moderate 08/2010; 12/05/2012    Mild to moderate Aortic Stenosis;; Stable in 2014 --> EF 55-60%, Mod AoV Calcification with mild-moderate stenosis; mild MR. (Stable)  . Arthritis   . Diabetes mellitus   . High cholesterol   . Hypertension     "from the diabetes"  . Angina   . GERD (gastroesophageal reflux disease)   . BPH (benign prostatic hypertrophy)   . Bilateral carotid artery disease     CAROTID DOPPLER,05/21/2008 - Right and left ICA-0-49% diameter reduction, left CCA-0-49% diameter reductiion    Prior Cardiac Evaluation and Past Surgical History: Past Surgical History  Procedure Laterality Date  . Transurethral resection of prostate  ~ 2010  . Colectomy  ~ 2000  . Cardiac catheterization  03/21/2011    Continue to optimize medical therapy  . Cardiac catheterization with pci  08/27/2006    RCA-mid - 2.5x41mm Promus stent  . Nm myoview ltd  03/09/2011    Post-stress EF 76%, ECG positive for ischemia, normal study  . Left heart catheterization with coronary angiogram N/A 03/22/2011    Procedure:  LEFT HEART CATHETERIZATION WITH CORONARY ANGIOGRAM;  Surgeon: Leonie Man, MD;  Location: [facility] CATH LAB;  Service: Cardiovascular;  Laterality: N/A;    Interval History:  Tolerating routine exercise without any notable cardiac Symptoms.  He is able to get HR up to ~120 bpm on Elliptical.  Very stable from a CV standpoint.  No angina or CHF symptoms.  No arrhythmia or syncope-like symptoms.  Cardiovascular ROS: no chest pain or dyspnea on exertion positive for - murmur negative for - edema, irregular heartbeat, loss of consciousness, orthopnea, palpitations, paroxysmal nocturnal dyspnea, rapid heart rate, shortness of breath or No TIA/amaurosis fugax symptoms. Occasional leg cramps - if not adequately hydrated  ROS: A comprehensive was performed. Review of Systems  Constitutional: Negative for fever, chills and malaise/fatigue.  HENT: Positive for congestion (with sneezing - allergies).   Eyes: Negative for blurred vision.  Respiratory: Negative for cough and wheezing.   Cardiovascular: Negative for claudication.  Gastrointestinal: Negative for blood in stool and melena.       Metformin related abdominal pain/nausea  Genitourinary: Negative for hematuria.       Nocuturia  Neurological: Positive for dizziness (Occasional Vertigo - feels like he is loosing balance;). Negative for headaches.  Endo/Heme/Allergies: Bruises/bleeds easily.  All other systems reviewed and are negative.   Current Outpatient Prescriptions on File Prior to Visit  Medication Sig Dispense Refill  . aspirin EC 81 MG tablet Take 81 mg by mouth daily.    . Calcium Carbonate-Vitamin D (CALCIUM 600+D) 600-400 MG-UNIT per tablet Take 1 tablet by mouth daily.    . cetirizine (ZYRTEC) 10 MG tablet Take 10 mg by mouth daily.    . Cholecalciferol (VITAMIN D3) 2000 UNITS TABS Take 1 tablet by mouth daily.    . clopidogrel (PLAVIX) 75 MG tablet Take 75 mg by mouth daily.    . Coenzyme Q10 (CO Q 10 PO) Take by mouth daily.     Marland Kitchen donepezil (ARICEPT) 10 MG tablet Take 10 mg by mouth daily.    Marland Kitchen ezetimibe-simvastatin (VYTORIN) 10-40 MG per tablet Take 1 tablet by mouth at bedtime.    . fish oil-omega-3 fatty acids 1000 MG capsule Take 2 g by mouth 2 (two) times daily.     . lansoprazole (PREVACID) 30 MG capsule Take 30 mg by mouth daily.    Marland Kitchen MAGNESIUM PO Take 1 tablet by mouth daily.    . metoprolol tartrate (LOPRESSOR) 25 MG tablet Take 12.5 mg by mouth 2 (two) times daily.    . Misc Natural Products (OSTEO BI-FLEX ADV JOINT SHIELD PO) Take 1 tablet by mouth daily.    . Multiple Vitamin (MULITIVITAMIN WITH MINERALS) TABS Take 1 tablet by mouth daily.    Marland Kitchen OVER THE COUNTER MEDICATION Take 1 tablet by mouth daily. Cinnamon plus chromium 1000mg     . OVER THE COUNTER MEDICATION Take 1 tablet by mouth daily. Peenut for prostate    . oxybutynin (DITROPAN XL) 15 MG 24 hr tablet Take 15 mg by mouth daily.    . Saw Palmetto 450 MG CAPS Take 2 capsules by mouth daily.    . Tamsulosin HCl (FLOMAX) 0.4 MG CAPS Take 0.4 mg by mouth every evening.    . Zinc 50 MG CAPS Take 50 mg by mouth daily.    . nitroGLYCERIN (NITROSTAT) 0.4 MG SL tablet Place 1 tablet (0.4 mg total) under the tongue every 5 (five) minutes x 3 doses as needed for chest pain. 20 tablet 0   No current facility-administered medications on file prior to visit.   Allergies  Allergen Reactions  . Oraxyl [Doxycycline Hyclate] Swelling    "started swelling in my mouth & tongue; had to go to emergency room; really bad reaction"  . Azithromycin Rash  . Cephalosporins Other (See Comments)    unknown  . Pseudoephedrine Other (See Comments)    unknown  . Amoxicillin Rash  . Codeine Itching and Rash  . Levofloxacin Rash  . Sulfa Antibiotics Rash    History  Substance Use Topics  . Smoking status: Never Smoker   . Smokeless tobacco: Former Systems developer    Types: Chew    Quit date: 01/31/1996  . Alcohol Use: 3.6 oz/week    6 Glasses of wine per week   History  reviewed. No pertinent family history.   Wt Readings from Last 3 Encounters:  05/18/14 172 lb 1.6 oz (78.064 kg)  06/09/13 166 lb (75.297 kg)  12/23/12 173 lb 8 oz (78.699 kg)    PHYSICAL EXAM BP 112/66 mmHg  Pulse 69  Ht 5\' 9"  (1.753 m)  Wt 172 lb 1.6 oz (78.064 kg)  BMI 25.40 kg/m2 General appearance: alert, cooperative, appears stated age, no distress and Pleasant mood and affect Neck: no carotid bruit and no JVD HEENT: [place]/AT, EOMI, MMM, anicteric sclera Lungs: clear to auscultation bilaterally, normal percussion bilaterally and  Nonlabored, normal excursion. Heart: regular rate and rhythm, S1& S2 normal, no S3 or S4, systolic murmur: systolic ejection 2/6, crescendo, decrescendo, harsh and Mid-peaking mid peaking at 2nd right intercostal space, radiates to carotids and no rub Abdomen: soft, non-tender; bowel sounds normal; no masses, no organomegaly Extremities: extremities normal, atraumatic, no cyanosis or edema Pulses: 2+ and symmetric Neurologic: Grossly normal;   Adult ECG Report  Rate: 69 ;  Rhythm: normal sinus rhythm and 1st Deg AVB  Narrative Interpretation: Otherwise normal EKG  Recent Labs:  PCP checked labs (this year)  ASSESSMENT / PLAN: Problem List Items Addressed This Visit    Aortic valve stenosis, moderate - Primary (Chronic)    Stable - last Echo in Nov 2014.  F/u Echo this November - will see him back after.      Relevant Orders   EKG 12-Lead   2D Echocardiogram without contrast   CAD S/P percutaneous coronary angioplasty --> PCI RCA Promus DES 2.5 mm x 23 mm (Chronic)    Asymptomatic. Exercises vigorously without angina. On stable Medication regimen. No changes. Ok to stop ASA.   Can stop Plavix for procedures.      Relevant Orders   EKG 12-Lead   2D Echocardiogram without contrast   Diabetes mellitus type II, controlled (Chronic)   Relevant Medications   metFORMIN (GLUCOPHAGE) 500 MG tablet   Essential hypertension (Chronic)    Well  controlled on current Rx.   No change to low dose BB.      Relevant Orders   EKG 12-Lead   2D Echocardiogram without contrast   Hyperlipidemia with target LDL less than 70 (Chronic)    On Vytorin.  No myalgia Previous labs showed excellent control.  Current labs unavailable yet from PCP.      Relevant Orders   EKG 12-Lead   2D Echocardiogram without contrast      Meds ordered this encounter  Medications  . metFORMIN (GLUCOPHAGE) 500 MG tablet    Sig: Take by mouth 2 (two) times daily with a meal.   Ok to stop ASA.  No need for CV work-up for Cataract Sgx.  Followup: ~Dec 2016 -jan 2017 in [place] after Echo.    Leonie Man, M.D., M.S. Interventional Cardiologist   Pager # [phone]

## 2014-05-18 NOTE — Assessment & Plan Note (Signed)
Asymptomatic. Exercises vigorously without angina. On stable Medication regimen. No changes. Ok to stop ASA.   Can stop Plavix for procedures.

## 2014-05-18 NOTE — Patient Instructions (Signed)
SCHEDULE IN NOV 2016- IN [place]---Your physician has requested that you have an echocardiogram. Echocardiography is a painless test that uses sound waves to create images of your heart. It provides your doctor with information about the size and shape of your heart and how well your heart's chambers and valves are working. This procedure takes approximately one hour. There are no restrictions for this procedure.  NO CHANGE WITH MEDICATIONS  Your physician wants you to follow-up in [place] 2016-2017-[place] OFFICE- DR HARDING.  You will receive a reminder letter in the mail two months in advance. If you don't receive a letter, please call our office to schedule the follow-up appointment.

## 2014-05-18 NOTE — Assessment & Plan Note (Signed)
Stable - last Echo in Nov 2014.  F/u Echo this November - will see him back after.

## 2014-05-19 ENCOUNTER — Encounter: Payer: Self-pay | Admitting: Cardiology

## 2014-05-19 NOTE — Assessment & Plan Note (Signed)
Well controlled on current Rx.   No change to low dose BB.

## 2014-05-19 NOTE — Assessment & Plan Note (Signed)
On Vytorin.  No myalgia Previous labs showed excellent control.  Current labs unavailable yet from PCP.

## 2014-05-25 ENCOUNTER — Ambulatory Visit
Admit: 2014-05-25 | Disposition: A | Discharge status: Home / Self Care | Payer: Self-pay | Attending: Ophthalmology | Admitting: Ophthalmology

## 2014-05-31 NOTE — Op Note (Addendum)
PATIENT NAME:  Victor [name], Victor [name] MR#:  [id number] DATE OF BIRTH:  1932-01-27  DATE OF PROCEDURE:  05/25/2014  PREOPERATIVE DIAGNOSIS:  Nuclear sclerotic cataract, left eye.    POSTOPERATIVE DIAGNOSIS:  Nuclear sclerotic cataract, left eye.  PROCEDURE PERFORMED:  Extracapsular cataract extraction using phacoemulsification with placement of an Alcon SN60WS, 19.5-diopter posterior chamber lens, serial number [id number].   SURGEON:  Loura Back. Carisha Kantor, MD  ASSISTANT:  None.  ANESTHESIA:  4% lidocaine and 0.75% Marcaine in a 50/50 mixture with 10 units per mL of Hylenex added, given as a peribulbar.  ANESTHESIOLOGIST:  Dr. Ronelle Nigh.    COMPLICATIONS:  None.  ESTIMATED BLOOD LOSS:  Less than 1 ml.  DESCRIPTION OF PROCEDURE:  The patient was brought to the operating room and given a peribulbar block.  The patient was then prepped and draped in the usual fashion.  The vertical rectus muscles were imbricated using 5-0 silk sutures.  These sutures were then clamped to the sterile drapes as bridle sutures.  A limbal peritomy was performed extending two clock hours and hemostasis was obtained with cautery.  A partial thickness scleral groove was made at the surgical limbus and dissected anteriorly in a lamellar dissection using an Alcon crescent knife.  The anterior chamber was entered supero-temporally with a Superblade and through the lamellar dissection with a 2.6 mm keratome.  DisCoVisc was used to replace the aqueous and a continuous tear capsulorrhexis was carried out.  Hydrodissection and hydrodelineation were carried out with balanced salt and a 27 gauge canula.  The nucleus was rotated to confirm the effectiveness of the hydrodissection.  Phacoemulsification was carried out using a divide-and-conquer technique.  Total ultrasound time was 1 minute and 26 seconds with an average power of 25.8 percent. CDE of 41.4  Irrigation/aspiration was used to remove the residual cortex.  DisCoVisc was used  to inflate the capsule and the internal incision was enlarged to 3 mm with the crescent knife.  The intraocular lens was folded and inserted into the capsular bag using the AcrySert delivery system.  Irrigation/aspiration was used to remove the residual DisCoVisc.  Miostat was injected into the anterior chamber through the paracentesis track to inflate the anterior chamber and induce miosis.  0.1 mL of Vigamox containing 0.1 mg of drug was injected via the paracentesis track.   The wound was checked for leaks and none were found. The conjunctiva was closed with cautery and the bridle sutures were removed.  Two drops of 0.3% Vigamox were placed on the eye.   An eye shield was placed on the eye.  The patient was discharged to the recovery room in good condition.   ____________________________ Loura Back Laiya Wisby, MD sad:bu D: 05/25/2014 13:37:27 ET T: 05/25/2014 16:49:45 ET JOB#: [id number]  cc: Remo Lipps A. Ion Gonnella, MD, <Dictator> Martie Lee MD ELECTRONICALLY SIGNED 05/30/2014 12:17

## 2014-07-13 ENCOUNTER — Encounter: Payer: Self-pay | Admitting: Cardiology

## 2014-07-15 ENCOUNTER — Telehealth: Payer: Self-pay | Admitting: *Deleted

## 2014-07-15 NOTE — Telephone Encounter (Signed)
Left message to call back concerning labs

## 2014-07-15 NOTE — Telephone Encounter (Signed)
-----   Message from Leonie Man, MD sent at 07/14/2014  3:02 PM EDT ----- Recent Labs: 07/01/2014 Na+ 138, K+ 4.7, Cl- 102, HCO3- 30.8 , BUN 15, Cr 1, Glu 118, Ca2+ 9.3; AST 23, ALT 22 AlkP 51, Alb 4.5, TP 6.8, T Bili 0.5 TC 118, TG 82, HDL 46, LDL 56 -- goal  All labs look good.  Lipids are @ goal.   Continue current medication

## 2014-07-15 NOTE — Telephone Encounter (Signed)
Spoke to patient. Result given . Verbalized understanding  

## 2014-08-31 HISTORY — PX: NM MYOVIEW LTD: HXRAD82

## 2014-09-06 ENCOUNTER — Inpatient Hospital Stay
Admission: EM | Admit: 2014-09-06 | Discharge: 2014-09-08 | DRG: 310 | Disposition: A | Discharge status: Home / Self Care | Payer: Medicare Other | Attending: Internal Medicine | Admitting: Internal Medicine

## 2014-09-06 ENCOUNTER — Encounter: Payer: Self-pay | Admitting: Internal Medicine

## 2014-09-06 ENCOUNTER — Emergency Department: Payer: Medicare Other

## 2014-09-06 DIAGNOSIS — E785 Hyperlipidemia, unspecified: Secondary | ICD-10-CM | POA: Diagnosis present

## 2014-09-06 DIAGNOSIS — K219 Gastro-esophageal reflux disease without esophagitis: Secondary | ICD-10-CM | POA: Diagnosis present

## 2014-09-06 DIAGNOSIS — I350 Nonrheumatic aortic (valve) stenosis: Secondary | ICD-10-CM | POA: Diagnosis present

## 2014-09-06 DIAGNOSIS — Z881 Allergy status to other antibiotic agents status: Secondary | ICD-10-CM

## 2014-09-06 DIAGNOSIS — I493 Ventricular premature depolarization: Secondary | ICD-10-CM | POA: Diagnosis not present

## 2014-09-06 DIAGNOSIS — E119 Type 2 diabetes mellitus without complications: Secondary | ICD-10-CM | POA: Diagnosis present

## 2014-09-06 DIAGNOSIS — R001 Bradycardia, unspecified: Secondary | ICD-10-CM | POA: Diagnosis present

## 2014-09-06 DIAGNOSIS — Z79899 Other long term (current) drug therapy: Secondary | ICD-10-CM

## 2014-09-06 DIAGNOSIS — N400 Enlarged prostate without lower urinary tract symptoms: Secondary | ICD-10-CM | POA: Diagnosis present

## 2014-09-06 DIAGNOSIS — I10 Essential (primary) hypertension: Secondary | ICD-10-CM | POA: Diagnosis present

## 2014-09-06 DIAGNOSIS — G2581 Restless legs syndrome: Secondary | ICD-10-CM | POA: Diagnosis present

## 2014-09-06 DIAGNOSIS — E780 Pure hypercholesterolemia: Secondary | ICD-10-CM | POA: Diagnosis present

## 2014-09-06 DIAGNOSIS — M1990 Unspecified osteoarthritis, unspecified site: Secondary | ICD-10-CM | POA: Diagnosis present

## 2014-09-06 DIAGNOSIS — Z888 Allergy status to other drugs, medicaments and biological substances status: Secondary | ICD-10-CM | POA: Diagnosis not present

## 2014-09-06 DIAGNOSIS — Z8249 Family history of ischemic heart disease and other diseases of the circulatory system: Secondary | ICD-10-CM | POA: Diagnosis not present

## 2014-09-06 DIAGNOSIS — Z87891 Personal history of nicotine dependence: Secondary | ICD-10-CM

## 2014-09-06 DIAGNOSIS — I499 Cardiac arrhythmia, unspecified: Secondary | ICD-10-CM | POA: Diagnosis not present

## 2014-09-06 DIAGNOSIS — Z885 Allergy status to narcotic agent status: Secondary | ICD-10-CM | POA: Diagnosis not present

## 2014-09-06 DIAGNOSIS — Z7982 Long term (current) use of aspirin: Secondary | ICD-10-CM

## 2014-09-06 DIAGNOSIS — Z882 Allergy status to sulfonamides status: Secondary | ICD-10-CM | POA: Diagnosis not present

## 2014-09-06 DIAGNOSIS — I2510 Atherosclerotic heart disease of native coronary artery without angina pectoris: Secondary | ICD-10-CM | POA: Diagnosis present

## 2014-09-06 DIAGNOSIS — Z9861 Coronary angioplasty status: Secondary | ICD-10-CM

## 2014-09-06 DIAGNOSIS — Z7902 Long term (current) use of antithrombotics/antiplatelets: Secondary | ICD-10-CM

## 2014-09-06 LAB — MRSA PCR SCREENING: MRSA by PCR: NEGATIVE

## 2014-09-06 LAB — GLUCOSE, CAPILLARY: Glucose-Capillary: 244 mg/dL — ABNORMAL HIGH (ref 65–99)

## 2014-09-06 LAB — CBC
HCT: 44.2 % (ref 40.0–52.0)
Hemoglobin: 15.3 g/dL (ref 13.0–18.0)
MCH: 32.2 pg (ref 26.0–34.0)
MCHC: 34.6 g/dL (ref 32.0–36.0)
MCV: 92.9 fL (ref 80.0–100.0)
Platelets: 147 10*3/uL — ABNORMAL LOW (ref 150–440)
RBC: 4.76 MIL/uL (ref 4.40–5.90)
RDW: 13.1 % (ref 11.5–14.5)
WBC: 6.6 10*3/uL (ref 3.8–10.6)

## 2014-09-06 LAB — MAGNESIUM: Magnesium: 2.1 mg/dL (ref 1.7–2.4)

## 2014-09-06 LAB — BASIC METABOLIC PANEL
Anion gap: 8 (ref 5–15)
BUN: 19 mg/dL (ref 6–20)
CO2: 28 mmol/L (ref 22–32)
Calcium: 9.5 mg/dL (ref 8.9–10.3)
Chloride: 104 mmol/L (ref 101–111)
Creatinine, Ser: 0.97 mg/dL (ref 0.61–1.24)
GFR calc Af Amer: >60 mL/min (ref >60)
GFR calc non Af Amer: >60 mL/min (ref >60)
Glucose, Bld: 169 mg/dL — ABNORMAL HIGH (ref 65–99)
Potassium: 4.2 mmol/L (ref 3.5–5.1)
Sodium: 140 mmol/L (ref 135–145)

## 2014-09-06 LAB — TROPONIN I
Troponin I: <0.03 ng/mL (ref <0.031)
Troponin I: <0.03 ng/mL (ref <0.031)

## 2014-09-06 LAB — TSH: TSH: 3.908 u[IU]/mL (ref 0.350–4.500)

## 2014-09-06 MED ORDER — IPRATROPIUM BROMIDE 0.03 % NA SOLN: 2.0000 | Freq: Three times a day (TID) | NASAL | Status: DC | PRN

## 2014-09-06 MED ORDER — PANTOPRAZOLE SODIUM 40 MG PO TBEC
40.0000 mg | DELAYED_RELEASE_TABLET | Freq: Every day | ORAL | Status: DC
  Administered 2014-09-07 – 2014-09-08 (×2): 40 mg via ORAL
  Filled 2014-09-06 (×2): qty 1

## 2014-09-06 MED ORDER — NITROGLYCERIN 0.4 MG SL SUBL: 0.4000 mg | SUBLINGUAL_TABLET | SUBLINGUAL | Status: DC | PRN

## 2014-09-06 MED ORDER — ADULT MULTIVITAMIN W/MINERALS CH
1.0000 | ORAL_TABLET | Freq: Every day | ORAL | Status: DC
  Administered 2014-09-07: 1 via ORAL
  Filled 2014-09-06: qty 1

## 2014-09-06 MED ORDER — EZETIMIBE-SIMVASTATIN 10-40 MG PO TABS: 1.0000 | ORAL_TABLET | Freq: Every day | ORAL | Status: DC

## 2014-09-06 MED ORDER — SIMVASTATIN 40 MG PO TABS
40.0000 mg | ORAL_TABLET | Freq: Every day | ORAL | Status: DC
  Administered 2014-09-06 – 2014-09-07 (×2): 40 mg via ORAL
  Filled 2014-09-06 (×2): qty 1

## 2014-09-06 MED ORDER — ONDANSETRON HCL 4 MG PO TABS: 4.0000 mg | ORAL_TABLET | Freq: Four times a day (QID) | ORAL | Status: DC | PRN

## 2014-09-06 MED ORDER — DONEPEZIL HCL 5 MG PO TABS
10.0000 mg | ORAL_TABLET | Freq: Every day | ORAL | Status: DC
  Administered 2014-09-06 – 2014-09-08 (×3): 10 mg via ORAL
  Filled 2014-09-06 (×3): qty 2

## 2014-09-06 MED ORDER — LORATADINE 10 MG PO TABS
10.0000 mg | ORAL_TABLET | Freq: Every day | ORAL | Status: DC
  Administered 2014-09-06 – 2014-09-08 (×3): 10 mg via ORAL
  Filled 2014-09-06 (×3): qty 1

## 2014-09-06 MED ORDER — SODIUM CHLORIDE 0.9 % IV BOLUS (SEPSIS)
1000.0000 mL | Freq: Once | INTRAVENOUS | Status: AC
  Administered 2014-09-06: 1000 mL via INTRAVENOUS

## 2014-09-06 MED ORDER — ATROPINE SULFATE 0.1 MG/ML IJ SOLN
0.5000 mg | Freq: Once | INTRAMUSCULAR | Status: AC
Start: 2014-09-06 — End: 2014-09-06
  Administered 2014-09-06: 0.5 mg via INTRAVENOUS
  Filled 2014-09-06: qty 10

## 2014-09-06 MED ORDER — ACETAMINOPHEN 650 MG RE SUPP: 650.0000 mg | Freq: Four times a day (QID) | RECTAL | Status: DC | PRN

## 2014-09-06 MED ORDER — ACETAMINOPHEN 325 MG PO TABS: 650.0000 mg | ORAL_TABLET | Freq: Four times a day (QID) | ORAL | Status: DC | PRN

## 2014-09-06 MED ORDER — TAMSULOSIN HCL 0.4 MG PO CAPS
0.4000 mg | ORAL_CAPSULE | Freq: Every day | ORAL | Status: DC
  Administered 2014-09-06 – 2014-09-08 (×3): 0.4 mg via ORAL
  Filled 2014-09-06 (×3): qty 1

## 2014-09-06 MED ORDER — CALCIUM CARBONATE-VITAMIN D 500-200 MG-UNIT PO TABS
1.0000 | ORAL_TABLET | Freq: Every day | ORAL | Status: DC
  Administered 2014-09-07 – 2014-09-08 (×2): 1 via ORAL
  Filled 2014-09-06 (×2): qty 1

## 2014-09-06 MED ORDER — MAGNESIUM OXIDE 400 (241.3 MG) MG PO TABS
400.0000 mg | ORAL_TABLET | Freq: Every day | ORAL | Status: DC
  Administered 2014-09-06 – 2014-09-08 (×3): 400 mg via ORAL
  Filled 2014-09-06 (×3): qty 1

## 2014-09-06 MED ORDER — OXYBUTYNIN CHLORIDE ER 15 MG PO TB24
15.0000 mg | ORAL_TABLET | Freq: Every day | ORAL | Status: DC
  Administered 2014-09-06 – 2014-09-08 (×3): 15 mg via ORAL
  Filled 2014-09-06 (×4): qty 1

## 2014-09-06 MED ORDER — INSULIN ASPART 100 UNIT/ML ~~LOC~~ SOLN
0.0000 [IU] | Freq: Three times a day (TID) | SUBCUTANEOUS | Status: DC
Start: 2014-09-07 — End: 2014-09-08
  Administered 2014-09-07: 1 [IU] via SUBCUTANEOUS
  Administered 2014-09-07: 2 [IU] via SUBCUTANEOUS
  Administered 2014-09-08: 1 [IU] via SUBCUTANEOUS
  Filled 2014-09-06: qty 2
  Filled 2014-09-06 (×2): qty 1

## 2014-09-06 MED ORDER — HEPARIN SODIUM (PORCINE) 5000 UNIT/ML IJ SOLN
5000.0000 [IU] | Freq: Three times a day (TID) | INTRAMUSCULAR | Status: DC
  Administered 2014-09-06 – 2014-09-08 (×5): 5000 [IU] via SUBCUTANEOUS
  Filled 2014-09-06 (×5): qty 1

## 2014-09-06 MED ORDER — LORATADINE 10 MG PO TABS: 10.0000 mg | ORAL_TABLET | Freq: Every day | ORAL | Status: DC

## 2014-09-06 MED ORDER — EZETIMIBE 10 MG PO TABS
10.0000 mg | ORAL_TABLET | Freq: Every day | ORAL | Status: DC
  Administered 2014-09-06 – 2014-09-07 (×2): 10 mg via ORAL
  Filled 2014-09-06 (×2): qty 1

## 2014-09-06 MED ORDER — MAGNESIUM SULFATE 2 GM/50ML IV SOLN
2.0000 g | Freq: Once | INTRAVENOUS | Status: AC
  Administered 2014-09-06: 2 g via INTRAVENOUS
  Filled 2014-09-06: qty 50

## 2014-09-06 MED ORDER — CLOPIDOGREL BISULFATE 75 MG PO TABS
75.0000 mg | ORAL_TABLET | Freq: Every day | ORAL | Status: DC
  Administered 2014-09-06 – 2014-09-08 (×3): 75 mg via ORAL
  Filled 2014-09-06 (×3): qty 1

## 2014-09-06 MED ORDER — FLUTICASONE PROPIONATE 50 MCG/ACT NA SUSP
2.0000 | Freq: Every day | NASAL | Status: DC
  Administered 2014-09-07 – 2014-09-08 (×2): 2 via NASAL
  Filled 2014-09-06: qty 16

## 2014-09-06 MED ORDER — CALCIUM CARBONATE 600 MG PO TABS
600.0000 mg | ORAL_TABLET | Freq: Every day | ORAL | Status: DC
  Filled 2014-09-06 (×2): qty 1

## 2014-09-06 MED ORDER — GABAPENTIN 100 MG PO CAPS
200.0000 mg | ORAL_CAPSULE | Freq: Every day | ORAL | Status: DC
  Administered 2014-09-06 – 2014-09-07 (×2): 200 mg via ORAL
  Filled 2014-09-06 (×2): qty 2

## 2014-09-06 MED ORDER — VITAMIN B-12 1000 MCG PO TABS
1000.0000 ug | ORAL_TABLET | Freq: Every day | ORAL | Status: DC
  Administered 2014-09-06 – 2014-09-08 (×3): 1000 ug via ORAL
  Filled 2014-09-06 (×3): qty 1

## 2014-09-06 MED ORDER — SODIUM CHLORIDE 0.9 % IV SOLN
INTRAVENOUS | Status: DC
  Administered 2014-09-06 – 2014-09-07 (×2): via INTRAVENOUS

## 2014-09-06 MED ORDER — ONDANSETRON HCL 4 MG/2ML IJ SOLN: 4.0000 mg | Freq: Four times a day (QID) | INTRAMUSCULAR | Status: DC | PRN

## 2014-09-06 MED ORDER — METFORMIN HCL ER 500 MG PO TB24
250.0000 mg | ORAL_TABLET | Freq: Every day | ORAL | Status: DC
  Administered 2014-09-07: 250 mg via ORAL
  Filled 2014-09-06: qty 1

## 2014-09-06 MED ORDER — ATROPINE SULFATE 0.1 MG/ML IJ SOLN
0.5000 mg | Freq: Once | INTRAMUSCULAR | Status: AC
  Administered 2014-09-06: 0.5 mg via INTRAVENOUS
  Filled 2014-09-06: qty 10

## 2014-09-06 MED ORDER — ASPIRIN EC 81 MG PO TBEC
81.0000 mg | DELAYED_RELEASE_TABLET | Freq: Every day | ORAL | Status: DC
  Administered 2014-09-06 – 2014-09-08 (×3): 81 mg via ORAL
  Filled 2014-09-06 (×3): qty 1

## 2014-09-06 NOTE — ED Provider Notes (Signed)
[facility] Emergency Department Provider Note  ____________________________________________  Time seen: 6:20 PM  I have reviewed the triage vital signs and the nursing notes.   HISTORY  Chief Complaint Bradycardia    HPI Victor [name] is a 81 y.o. male who complains of worsening dizziness for about 1 week. It is worse when he stands up or tries to ambulate. He's been taking all his medications as prescribed. No other new symptoms although he did have one episode of chest pain yesterday which lasted for 10 seconds is related to turning his upper body. When he turned back to resolve. It was described as a cramping in his left chest which was nonradiating and not associated with any nausea vomiting diaphoresis shortness of breath or syncope. He's not been having any exertional symptoms except for orthostatic dizziness.  He has a cardiac history consisting of CAD with a prior stent for which she follows with cardiologist Dr. [name]. No problems with slow heart rate or irregular heart rate in the past.   Past Medical History  Diagnosis Date  . CAD S/P percutaneous coronary angioplasty 07/2006    PCI to RCA - Promus DES 2.5 mm x 23 mm; 2D ECHO - EF >55%, moderate calcification of the aortic valve leaflets  . Aortic valve stenosis, moderate 08/2010; 12/05/2012    Mild to moderate Aortic Stenosis;; Stable in 2014 --> EF 55-60%, Mod AoV Calcification with mild-moderate stenosis; mild MR. (Stable)  . Arthritis   . Diabetes mellitus   . High cholesterol   . Hypertension     "from the diabetes"  . Angina   . GERD (gastroesophageal reflux disease)   . BPH (benign prostatic hypertrophy)   . Bilateral carotid artery disease     CAROTID DOPPLER,05/21/2008 - Right and left ICA-0-49% diameter reduction, left CCA-0-49% diameter reductiion    Patient Active Problem List   Diagnosis Date Noted  . Chest pain 03/21/2011  . Essential hypertension 03/21/2011  . Hyperlipidemia  with target LDL less than 70 03/21/2011  . Diabetes mellitus type II, controlled 03/21/2011  . BPH (benign prostatic hyperplasia) 03/21/2011  . Aortic valve stenosis, moderate 08/31/2010  . CAD S/P percutaneous coronary angioplasty --> PCI RCA Promus DES 2.5 mm x 23 mm 07/31/2006    Past Surgical History  Procedure Laterality Date  . Transurethral resection of prostate  ~ 2010  . Colectomy  ~ 2000  . Cardiac catheterization  03/21/2011    Continue to optimize medical therapy  . Cardiac catheterization with pci  08/27/2006    RCA-mid - 2.5x72mm Promus stent  . Nm myoview ltd  03/09/2011    Post-stress EF 76%, ECG positive for ischemia, normal study  . Left heart catheterization with coronary angiogram N/A 03/22/2011    Procedure: LEFT HEART CATHETERIZATION WITH CORONARY ANGIOGRAM;  Surgeon: Leonie Man, MD;  Location: [facility] CATH LAB;  Service: Cardiovascular;  Laterality: N/A;    Current Outpatient Rx  Name  Route  Sig  Dispense  Refill  . aspirin EC 81 MG tablet   Oral   Take 81 mg by mouth daily.         . Calcium Carbonate-Vitamin D (CALCIUM 600+D) 600-400 MG-UNIT per tablet   Oral   Take 1 tablet by mouth daily.         . cetirizine (ZYRTEC) 10 MG tablet   Oral   Take 10 mg by mouth daily.         . Cholecalciferol (VITAMIN D3) 2000  UNITS TABS   Oral   Take 1 tablet by mouth daily.         . clopidogrel (PLAVIX) 75 MG tablet   Oral   Take 75 mg by mouth daily.         . Coenzyme Q10 (CO Q 10 PO)   Oral   Take by mouth daily.         Marland Kitchen donepezil (ARICEPT) 10 MG tablet   Oral   Take 10 mg by mouth daily.         Marland Kitchen ezetimibe-simvastatin (VYTORIN) 10-40 MG per tablet   Oral   Take 1 tablet by mouth at bedtime.         . fish oil-omega-3 fatty acids 1000 MG capsule   Oral   Take 2 g by mouth 2 (two) times daily.          . lansoprazole (PREVACID) 30 MG capsule   Oral   Take 30 mg by mouth daily.         Marland Kitchen MAGNESIUM PO   Oral   Take 1  tablet by mouth daily.         . metFORMIN (GLUCOPHAGE) 500 MG tablet   Oral   Take by mouth 2 (two) times daily with a meal.         . metoprolol tartrate (LOPRESSOR) 25 MG tablet   Oral   Take 12.5 mg by mouth 2 (two) times daily.         . Misc Natural Products (OSTEO BI-FLEX ADV JOINT SHIELD PO)   Oral   Take 1 tablet by mouth daily.         . Multiple Vitamin (MULITIVITAMIN WITH MINERALS) TABS   Oral   Take 1 tablet by mouth daily.         Marland Kitchen EXPIRED: nitroGLYCERIN (NITROSTAT) 0.4 MG SL tablet   Sublingual   Place 1 tablet (0.4 mg total) under the tongue every 5 (five) minutes x 3 doses as needed for chest pain.   20 tablet   0   . OVER THE COUNTER MEDICATION   Oral   Take 1 tablet by mouth daily. Cinnamon plus chromium 1000mg          . OVER THE COUNTER MEDICATION   Oral   Take 1 tablet by mouth daily. Peenut for prostate         . oxybutynin (DITROPAN XL) 15 MG 24 hr tablet   Oral   Take 15 mg by mouth daily.         . Saw Palmetto 450 MG CAPS   Oral   Take 2 capsules by mouth daily.         . Tamsulosin HCl (FLOMAX) 0.4 MG CAPS   Oral   Take 0.4 mg by mouth every evening.         . Zinc 50 MG CAPS   Oral   Take 50 mg by mouth daily.           Allergies Oraxyl; Azithromycin; Cephalosporins; Pseudoephedrine; Amoxicillin; Codeine; Levofloxacin; and Sulfa antibiotics  No family history on file.  Social History History  Substance Use Topics  . Smoking status: Never Smoker   . Smokeless tobacco: Former Systems developer    Types: Chew    Quit date: 01/31/1996  . Alcohol Use: 3.6 oz/week    6 Glasses of wine per week    Review of Systems  Constitutional: No fever or chills. No weight changes Eyes:No blurry vision  or double vision.  ENT: No sore throat. Cardiovascular: Fleeting chest pain yesterday for one episode as above . Respiratory: No dyspnea or cough. Gastrointestinal: Negative for abdominal pain, vomiting and diarrhea.  No BRBPR  or melena. Genitourinary: Negative for dysuria, urinary retention, bloody urine, or difficulty urinating. Musculoskeletal: Negative for back pain. No joint swelling or pain. Skin: Negative for rash. Neurological: Constant dizziness times one week  Psychiatric:No anxiety or depression.   Endocrine:No hot/cold intolerance, changes in energy, or sleep difficulty.  10-point ROS otherwise negative.  ____________________________________________   PHYSICAL EXAM:  VITAL SIGNS: ED Triage Vitals  Enc Vitals Group     BP 09/06/14 1823 140/57 mmHg     Pulse Rate 09/06/14 1823 41     Resp 09/06/14 1823 19     Temp 09/06/14 1823 97.9 F (36.6 C)     Temp Source 09/06/14 1823 Oral     SpO2 09/06/14 1823 94 %     Weight 09/06/14 1823 175 lb (79.379 kg)     Height 09/06/14 1823 5\' 10"  (1.778 m)     Head Cir --      Peak Flow --      Pain Score 09/06/14 1824 0     Pain Loc --      Pain Edu? --      Excl. in [place]? --      Constitutional: Alert and oriented. Well appearing and in no distress. Eyes: No scleral icterus. No conjunctival pallor. PERRL. EOMI ENT   Head: Normocephalic and atraumatic.   Nose: No congestion/rhinnorhea. No septal hematoma   Mouth/Throat: MMM, no pharyngeal erythema. No peritonsillar mass. No uvula shift.   Neck: No stridor. No SubQ emphysema. No meningismus. Hematological/Lymphatic/Immunilogical: No cervical lymphadenopathy. Cardiovascular: Bradycardia with a pulse and auscultated heart rate of 40. Normal and symmetric distal pulses are present in all extremities. No murmurs, rubs, or gallops. Respiratory: Normal respiratory effort without tachypnea nor retractions. Breath sounds are clear and equal bilaterally. No wheezes/rales/rhonchi. Gastrointestinal: Soft and nontender. No distention. There is no CVA tenderness.  No rebound, rigidity, or guarding. Genitourinary: deferred Musculoskeletal: Nontender with normal range of motion in all extremities. No  joint effusions.  No lower extremity tenderness.  No edema. Neurologic:   Normal speech and language.  CN 2-10 normal. Motor grossly intact. Gait not tested due to severe dizziness No gross focal neurologic deficits are appreciated.  Skin:  Skin is warm, dry and intact. No rash noted.  No petechiae, purpura, or bullae. Psychiatric: Mood and affect are normal. Speech and behavior are normal. Patient exhibits appropriate insight and judgment.  ____________________________________________    LABS (pertinent positives/negatives) (all labs ordered are listed, but only abnormal results are displayed) Labs Reviewed  BASIC METABOLIC PANEL - Abnormal; Notable for the following:    Glucose, Bld 169 (*)    All other components within normal limits  CBC - Abnormal; Notable for the following:    Platelets 147 (*)    All other components within normal limits  TROPONIN I  MAGNESIUM   ____________________________________________   EKG  EKG at 6:18 PM interpreted by me Sinus rhythm with 1-1 PVCs to sinus beats in a bigeminy pattern with a ventricular rate of 76. Sinus beats have a normal axis and intervals, QRS ST segments and T waves. Sinus rate is approximately 40, ventricular escape rate is approximate 40  Repeat EKG after 0.5 mg atropine trial at 7:09 PM interpreted by me Sinus rhythm rate of 90, left axis, normal intervals,  normal QRS ST segments and T waves. There is 3 PVCs in the 10 second strip. Bigeminy pattern has resolved and sinus rate is increased. ____________________________________________    RADIOLOGY  Chest x-ray unremarkable  ____________________________________________   PROCEDURES CRITICAL CARE Performed by: Joni Fears, Castin Donaghue   Total critical care time: 35 minutes  Critical care time was exclusive of separately billable procedures and treating other patients.  Critical care was necessary to treat or prevent imminent or life-threatening  deterioration.  Critical care was time spent personally by me on the following activities: development of treatment plan with patient and/or surrogate as well as nursing, discussions with consultants, evaluation of patient's response to treatment, examination of patient, obtaining history from patient or surrogate, ordering and performing treatments and interventions, ordering and review of laboratory studies, ordering and review of radiographic studies, pulse oximetry and re-evaluation of patient's condition.  ____________________________________________   INITIAL IMPRESSION / ASSESSMENT AND PLAN / ED COURSE  Pertinent labs & imaging results that were available during my care of the patient were reviewed by me and considered in my medical decision making (see chart for details).  Patient presents with symptomatic bradycardia with a pulse rate of 40 and constant dizziness that is worse when standing or trying to ambulate. He is on a beta blocker which she has been taking as prescribed. No symptoms of ACS. We'll check labs and give a trial of atropine and keep on the monitor with pacer pads at the ready.  ----------------------------------------- 7:57 PM on 09/06/2014 -----------------------------------------  Workup unremarkable. Discussed with Dr. Jens Som of [facility] who is covering for the patient's cardiologist Dr. [name] who recommends discontinuing beta blocker for now, and they will consult on the patient tomorrow. Patient has a normal blood pressure and no ischemic chest pain and normal mentation, so he does not require transcutaneous or transvenous cardiac pacing at this time. However due to the symptomatic bradycardia we will keep him on the monitor and admitted to the hospital.  ____________________________________________   FINAL CLINICAL IMPRESSION(S) / ED DIAGNOSES  Final diagnoses:  Symptomatic bradycardia  Ventricular ectopy      Carrie Mew, MD 09/06/14  2004

## 2014-09-06 NOTE — ED Notes (Signed)
Pt reports bradycardia today (around 40), dizziness.

## 2014-09-06 NOTE — H&P (Addendum)
[facility] [place] NAME: Victor [name]    MR#:  [id number]  DATE OF BIRTH:  07/01/1931  DATE OF ADMISSION:  09/06/2014  PRIMARY CARE PHYSICIAN: Sofie Hartigan, MD   REQUESTING/REFERRING PHYSICIAN: Carrie Mew  CHIEF COMPLAINT:   Chief Complaint  Patient presents with  . Bradycardia    HISTORY OF PRESENT ILLNESS: Victor [name]  is a 81 y.o. male with a known history of  coronary artery disease, hypertension, GERD, hypercholesterolemia, memory loss, allergic rhinitis, BPH, and restless leg syndrome presents to the emergency room complaining of 3-4 days of feeling dizzy for about 1 week. Patient whenever he stood up and try to ambulate his symptoms would get worse. Patient did not have any syncopal episode. He does not have any chest pain or shortness of breath. Patient emergency room was noted to have bradycardia and a heart rate in the 40s. The ER physician spoke to his cardiologist they recommended holding his metoprolol and hospitalization. PAST MEDICAL HISTORY:   Past Medical History  Diagnosis Date  . CAD S/P percutaneous coronary angioplasty 07/2006    PCI to RCA - Promus DES 2.5 mm x 23 mm; 2D ECHO - EF >55%, moderate calcification of the aortic valve leaflets  . Aortic valve stenosis, moderate 08/2010; 12/05/2012    Mild to moderate Aortic Stenosis;; Stable in 2014 --> EF 55-60%, Mod AoV Calcification with mild-moderate stenosis; mild MR. (Stable)  . Arthritis   . Diabetes mellitus   . High cholesterol   . Hypertension     "from the diabetes"  . Angina   . GERD (gastroesophageal reflux disease)   . BPH (benign prostatic hypertrophy)   . Bilateral carotid artery disease     CAROTID DOPPLER,05/21/2008 - Right and left ICA-0-49% diameter reduction, left CCA-0-49% diameter reductiion    PAST SURGICAL HISTORY:  Past Surgical History  Procedure Laterality Date  . Transurethral resection of prostate  ~ 2010  . Colectomy  ~  2000  . Cardiac catheterization  03/21/2011    Continue to optimize medical therapy  . Cardiac catheterization with pci  08/27/2006    RCA-mid - 2.5x44mm Promus stent  . Nm myoview ltd  03/09/2011    Post-stress EF 76%, ECG positive for ischemia, normal study  . Left heart catheterization with coronary angiogram N/A 03/22/2011    Procedure: LEFT HEART CATHETERIZATION WITH CORONARY ANGIOGRAM;  Surgeon: Leonie Man, MD;  Location: [facility] CATH LAB;  Service: Cardiovascular;  Laterality: N/A;    SOCIAL HISTORY:  History  Substance Use Topics  . Smoking status: Never Smoker   . Smokeless tobacco: Former Systems developer    Types: Chew    Quit date: 01/31/1996  . Alcohol Use: 3.6 oz/week    6 Glasses of wine per week    FAMILY HISTORY:  Family History  Problem Relation Age of Onset  . Hypertension      DRUG ALLERGIES:  Allergies  Allergen Reactions  . Oraxyl [Doxycycline Hyclate] Swelling    "started swelling in my mouth & tongue; had to go to emergency room; really bad reaction"  . Azithromycin Rash  . Cephalosporins Other (See Comments)    unknown  . Pseudoephedrine Other (See Comments)    unknown  . Amoxicillin Rash  . Codeine Itching and Rash  . Levofloxacin Rash  . Sulfa Antibiotics Rash    REVIEW OF SYSTEMS:   CONSTITUTIONAL: No fever, positive fatigue and weakness.  EYES: No blurred or double vision.  EARS, NOSE, AND THROAT: No tinnitus or ear pain.  RESPIRATORY: No cough, shortness of breath, wheezing or hemoptysis.  CARDIOVASCULAR: No chest pain, orthopnea, edema. Positive dizziness GASTROINTESTINAL: No nausea, vomiting, diarrhea or abdominal pain.  GENITOURINARY: No dysuria, hematuria.  ENDOCRINE: No polyuria, nocturia,  HEMATOLOGY: No anemia, easy bruising or bleeding SKIN: No rash or lesion. MUSCULOSKELETAL: No joint pain or arthritis.   NEUROLOGIC: No tingling, numbness, weakness.  PSYCHIATRY: No anxiety or depression.   MEDICATIONS AT HOME:  Prior to Admission  medications   Medication Sig Start Date End Date Taking? Authorizing Provider  aspirin EC 81 MG tablet Take 81 mg by mouth daily.   Yes Historical Provider, MD  calcium carbonate (OS-CAL) 600 MG TABS tablet Take 600 mg by mouth daily.   Yes Historical Provider, MD  cetirizine (ZYRTEC) 10 MG tablet Take 10 mg by mouth daily.   Yes Historical Provider, MD  Cholecalciferol (VITAMIN D3) 2000 UNITS TABS Take 1 tablet by mouth daily.   Yes Historical Provider, MD  clopidogrel (PLAVIX) 75 MG tablet Take 75 mg by mouth daily.   Yes Historical Provider, MD  Coenzyme Q10 (CO Q 10 PO) Take by mouth daily.   Yes Historical Provider, MD  donepezil (ARICEPT) 10 MG tablet Take 10 mg by mouth daily. 11/11/12  Yes Historical Provider, MD  EPIPEN 2-PAK 0.3 MG/0.3ML SOAJ injection Inject 0.3 mLs as directed as directed. 06/01/14  Yes Historical Provider, MD  ezetimibe-simvastatin (VYTORIN) 10-40 MG per tablet Take 1 tablet by mouth at bedtime.   Yes Historical Provider, MD  fexofenadine (ALLEGRA) 180 MG tablet Take 180 mg by mouth daily.   Yes Historical Provider, MD  fluticasone (FLONASE) 50 MCG/ACT nasal spray Place 2 sprays into both nostrils daily.   Yes Historical Provider, MD  gabapentin (NEURONTIN) 100 MG capsule Take 200 mg by mouth at bedtime.   Yes Historical Provider, MD  ipratropium (ATROVENT) 0.03 % nasal spray Place 2 sprays into both nostrils 3 (three) times daily as needed for rhinitis.   Yes Historical Provider, MD  lansoprazole (PREVACID) 30 MG capsule Take 30 mg by mouth daily. 11/04/12  Yes Historical Provider, MD  MAGNESIUM PO Take 1 tablet by mouth daily.   Yes Historical Provider, MD  Magnesium Sulfate 70 MG CAPS Take 70 mg by mouth daily.   Yes Historical Provider, MD  metFORMIN (GLUCOPHAGE-XR) 500 MG 24 hr tablet Take 250 mg by mouth daily.   Yes Historical Provider, MD  metoprolol succinate (TOPROL-XL) 25 MG 24 hr tablet Take 12.5 mg by mouth 2 (two) times daily.   Yes Historical Provider, MD   Misc Natural Products (OSTEO BI-FLEX ADV JOINT SHIELD PO) Take 1 tablet by mouth daily.   Yes Historical Provider, MD  Multiple Vitamin (MULITIVITAMIN WITH MINERALS) TABS Take 1 tablet by mouth daily.   Yes Historical Provider, MD  Olopatadine HCl 0.2 % SOLN Apply 1 drop to eye daily as needed. 11/11/13  Yes Historical Provider, MD  Omega 3 1200 MG CAPS Take 1,200 mg by mouth 2 (two) times daily.   Yes Historical Provider, MD  OVER THE COUNTER MEDICATION Take 1 tablet by mouth daily. Cinnamon plus chromium 1000mg    Yes Historical Provider, MD  OVER THE COUNTER MEDICATION Take 1 tablet by mouth daily. Peenut for prostate   Yes Historical Provider, MD  oxybutynin (DITROPAN XL) 15 MG 24 hr tablet Take 15 mg by mouth daily.   Yes Historical Provider, MD  Saw Palmetto 450 MG CAPS Take 2  capsules by mouth daily.   Yes Historical Provider, MD  Tamsulosin HCl (FLOMAX) 0.4 MG CAPS Take 0.4 mg by mouth daily. Take 30 minutes after same meal each day.   Yes Historical Provider, MD  vitamin B-12 (CYANOCOBALAMIN) 1000 MCG tablet Take 1,000 mcg by mouth daily.   Yes Historical Provider, MD  nitroGLYCERIN (NITROSTAT) 0.4 MG SL tablet Place 1 tablet (0.4 mg total) under the tongue every 5 (five) minutes x 3 doses as needed for chest pain. 03/22/11 12/23/12  Geradine Girt, DO      PHYSICAL EXAMINATION:   VITAL SIGNS: Blood pressure 115/86, pulse 37, temperature 97.9 F (36.6 C), temperature source Oral, resp. rate 17, height 5\' 10"  (1.778 m), weight 79.379 kg (175 lb), SpO2 94 %.  GENERAL:  81 y.o.-year-old patient lying in the bed with no acute distress.  EYES: Pupils equal, round, reactive to light and accommodation. No scleral icterus. Extraocular muscles intact.  HEENT: Head atraumatic, normocephalic. Oropharynx and nasopharynx clear.  NECK:  Supple, no jugular venous distention. No thyroid enlargement, no tenderness.  LUNGS: Normal breath sounds bilaterally, no wheezing, rales,rhonchi or crepitation. No  use of accessory muscles of respiration.  CARDIOVASCULAR: S1, S2 normal. No murmurs, rubs, or gallops.  ABDOMEN: Soft, nontender, nondistended. Bowel sounds present. No organomegaly or mass.  EXTREMITIES: No pedal edema, cyanosis, or clubbing.  NEUROLOGIC: Cranial nerves II through XII are intact. Muscle strength 5/5 in all extremities. Sensation intact. Gait not checked.  PSYCHIATRIC: The patient is alert and oriented x 3.  SKIN: No obvious rash, lesion, or ulcer.   LABORATORY PANEL:   CBC  Recent Labs Lab 09/06/14 1826  WBC 6.6  HGB 15.3  HCT 44.2  PLT 147*  MCV 92.9  MCH 32.2  MCHC 34.6  RDW 13.1   ------------------------------------------------------------------------------------------------------------------  Chemistries   Recent Labs Lab 09/06/14 1826  NA 140  K 4.2  CL 104  CO2 28  GLUCOSE 169*  BUN 19  CREATININE 0.97  CALCIUM 9.5  MG 2.1   ------------------------------------------------------------------------------------------------------------------ estimated creatinine clearance is 62.7 mL/min (by C-G formula based on Cr of 0.97). ------------------------------------------------------------------------------------------------------------------ No results for input(s): TSH, T4TOTAL, T3FREE, THYROIDAB in the last 72 hours.  Invalid input(s): FREET3   Coagulation profile No results for input(s): INR, PROTIME in the last 168 hours. ------------------------------------------------------------------------------------------------------------------- No results for input(s): DDIMER in the last 72 hours. -------------------------------------------------------------------------------------------------------------------  Cardiac Enzymes  Recent Labs Lab 09/06/14 1826  TROPONINI <0.03   ------------------------------------------------------------------------------------------------------------------ Invalid input(s):  POCBNP  ---------------------------------------------------------------------------------------------------------------  Urinalysis No results found for: COLORURINE, APPEARANCEUR, LABSPEC, PHURINE, GLUCOSEU, HGBUR, BILIRUBINUR, KETONESUR, PROTEINUR, UROBILINOGEN, NITRITE, LEUKOCYTESUR   RADIOLOGY: Dg Chest Port 1 View  09/06/2014   CLINICAL DATA:  Dizziness and bradycardia  EXAM: PORTABLE CHEST - 1 VIEW  COMPARISON:  March 21, 2011  FINDINGS: Lungs are clear. Heart size and pulmonary vascularity are normal. No adenopathy. No bone lesions.  IMPRESSION: No edema or consolidation.   Electronically Signed   By: Lowella Grip III M.D.   On: 09/06/2014 19:13    EKG: Orders placed or performed during the hospital encounter of 09/06/14  . ED EKG within 10 minutes  . ED EKG within 10 minutes    IMPRESSION AND PLAN: Patient is a 81 year old white male presents with dizziness  1 symptomatic bradycardia; suspect sick sinus syndrome, will discontinue metoprolol monitor on telemetry, use atropine as needed, cardiology consult, check TSH, echocardiogram of the heart  Hr continues to be in 30's monitor in icu   2.  Hypertension; hold metoprolol monitor blood pressure  3. Coronary artery disease continue aspirin, hold metoprolol  4. Hyperlipidemia continue Vytorin as taking at home  5. Diabetes type 2: Low-dose sliding scale continue metformin  6. GERD: Continue PPIs  7. Miscellaneous heparin for DVT prophylaxis     All the records are reviewed and case discussed with ED provider. Management plans discussed with the patient, family and they are in agreement.  CODE STATUS: Full    Code Status Orders        Start     Ordered   09/06/14 2030  Full code   Continuous     09/06/14 2030       TOTAL TIME TAKING CARE OF THIS PATIENT: criticacl care time55 minutes.    Dustin Flock M.D on 09/06/2014 at 8:39 PM  Between 7am to 6pm - Pager - [phone]  After 6pm go to  [email or web address] - password [email or web address] [facility]  [facility]  Office  [phone]  CC: Primary care physician; [facility], Chrissie Noa, MD

## 2014-09-07 ENCOUNTER — Encounter: Payer: Self-pay | Admitting: *Deleted

## 2014-09-07 ENCOUNTER — Inpatient Hospital Stay (HOSPITAL_COMMUNITY)
Admit: 2014-09-07 | Discharge: 2014-09-07 | Disposition: A | Discharge status: Home / Self Care | Payer: Medicare Other | Attending: Internal Medicine | Admitting: Internal Medicine

## 2014-09-07 DIAGNOSIS — I350 Nonrheumatic aortic (valve) stenosis: Secondary | ICD-10-CM

## 2014-09-07 DIAGNOSIS — I499 Cardiac arrhythmia, unspecified: Secondary | ICD-10-CM

## 2014-09-07 LAB — GLUCOSE, CAPILLARY
Glucose-Capillary: 140 mg/dL — ABNORMAL HIGH (ref 65–99)
Glucose-Capillary: 171 mg/dL — ABNORMAL HIGH (ref 65–99)
Glucose-Capillary: 106 mg/dL — ABNORMAL HIGH (ref 65–99)
Glucose-Capillary: 147 mg/dL — ABNORMAL HIGH (ref 65–99)

## 2014-09-07 LAB — BASIC METABOLIC PANEL
Anion gap: 4 — ABNORMAL LOW (ref 5–15)
BUN: 15 mg/dL (ref 6–20)
CO2: 29 mmol/L (ref 22–32)
Calcium: 8.5 mg/dL — ABNORMAL LOW (ref 8.9–10.3)
Chloride: 110 mmol/L (ref 101–111)
Creatinine, Ser: 0.82 mg/dL (ref 0.61–1.24)
GFR calc Af Amer: >60 mL/min (ref >60)
GFR calc non Af Amer: >60 mL/min (ref >60)
Glucose, Bld: 121 mg/dL — ABNORMAL HIGH (ref 65–99)
Potassium: 4.2 mmol/L (ref 3.5–5.1)
Sodium: 143 mmol/L (ref 135–145)

## 2014-09-07 LAB — CBC
HCT: 39.1 % — ABNORMAL LOW (ref 40.0–52.0)
Hemoglobin: 13.7 g/dL (ref 13.0–18.0)
MCH: 32.5 pg (ref 26.0–34.0)
MCHC: 35.1 g/dL (ref 32.0–36.0)
MCV: 92.5 fL (ref 80.0–100.0)
Platelets: 132 10*3/uL — ABNORMAL LOW (ref 150–440)
RBC: 4.23 MIL/uL — ABNORMAL LOW (ref 4.40–5.90)
RDW: 13.6 % (ref 11.5–14.5)
WBC: 6.9 10*3/uL (ref 3.8–10.6)

## 2014-09-07 LAB — TROPONIN I
Troponin I: <0.03 ng/mL (ref <0.031)
Troponin I: <0.03 ng/mL (ref <0.031)

## 2014-09-07 MED ORDER — METOPROLOL TARTRATE 25 MG PO TABS
25.0000 mg | ORAL_TABLET | Freq: Two times a day (BID) | ORAL | Status: DC
  Administered 2014-09-07 – 2014-09-08 (×3): 25 mg via ORAL
  Filled 2014-09-07 (×3): qty 1

## 2014-09-07 NOTE — Consult Note (Signed)
Cardiology Consultation Note  Patient ID: Victor [name], MRN: [id number], DOB/AGE: 1931/02/27 81 y.o. Admit date: 09/06/2014   Date of Consult: 09/07/2014 Primary Physician: Sofie Hartigan, MD Primary Cardiologist: Dr. [name], MD  Chief Complaint: Dizziness Reason for Consult: Dizziness and bradycardia   HPI: 81 y.o. male with h/o CAD s/p prior PCI, moderate aortic stenosis, bilateral carotid artery disease, DM2, HTN, HLD, and GERD who presented to [facility] on 8/7 with 1 week history of dizziness.   He has known CAD s/p PCI to RCA in 2008. Echo at that time showed EF >55% with moderate calcification of the aortic valve leaflets. Follow up echo in 2012 showed mild to moderate aortic stenosis. He underwent cardiac cath in 2013 in the setting of positive EKG (inferior TWI) with negative nuclear stress test. No ischemic symptoms at that time. Cardiac cath showed widely patient RCA stent with mild progression of proximal RCA disease to moderate 50-60% lesion. There was progression of ostial D3 lesion to 60-70%, not optimal for PCI given the ostial nature and size of the vessel. EF 55-60% with no WMA. It was recommended he continue optimal medical therapy. He was recently started on metformin for his DM and this has led to increased GI symptoms and weight loss.   Patient exercises regularly 3 times weekly, usually without issues. However, over the past week after doing leg presses he has been feeling somewhat lightheaded upon standing up. He notes feeling dizzy upon standing up from a sitting position on 8/7. This has been happening to him more frequently as of late. He initially thought this was his vertigo, but took his pulse and felt it to be in the 40's so he presented to [facility]. No syncopal episode. No chest pain, SOB, nausea, vomiting, or diaphoresis.   Upon review of his prior EKG's he has documented history of rare PVC.  Upon his arrival to [facility] he was thought to be bradycardic in the 40's, though  his underlying rhythm was sinus rhythm with ventricular bigeminy making his heart rate in the mid 80's. He was asymptomatic. His metoprolol 12.5 mg bid was held. Labs showed a K+ of 4.2-->4.2. Troponin negative x 4. Unremarkable CBC. Normal Mg and TSH. Normal CXR. EKG showed NSR, 90 bpm, left axis deviation, frequent PVC's, nonspecific inferior st/t changes. Since arriving in the unit his heart rate has been mid 80's to 90s in sinus rhythm with frequent PVC's and ventricular bigeminy.       Past Medical History  Diagnosis Date  . CAD S/P percutaneous coronary angioplasty 07/2006    PCI to RCA - Promus DES 2.5 mm x 23 mm; 2D ECHO - EF >55%, moderate calcification of the aortic valve leaflets  . Aortic valve stenosis, moderate 08/2010; 12/05/2012    Mild to moderate Aortic Stenosis;; Stable in 2014 --> EF 55-60%, Mod AoV Calcification with mild-moderate stenosis; mild MR. (Stable)  . Arthritis   . Diabetes mellitus   . High cholesterol   . Hypertension     "from the diabetes"  . Angina   . GERD (gastroesophageal reflux disease)   . BPH (benign prostatic hypertrophy)   . Bilateral carotid artery disease     CAROTID DOPPLER,05/21/2008 - Right and left ICA-0-49% diameter reduction, left CCA-0-49% diameter reductiion      Most Recent Cardiac Studies: Echo 11/2012:  Study Conclusions  - Left ventricle: The cavity size was normal. Wall thickness was increased in a pattern of mild LVH. Systolic function was normal. The  estimated ejection fraction was in the range of 55% to 60%. Wall motion was normal; there were no regional wall motion abnormalities. - Aortic valve: Moderately calcified annulus. Trileaflet; mildly thickened, moderately calcified leaflets. There was mild to moderate stenosis. Mild regurgitation. Valve area: 1.31cm^2(VTI). Valve area: 1.14cm^2 (Vmax). - Mitral valve: Calcified annulus. Mild regurgitation. Valve area by continuity equation (using LVOT flow):  2.03cm^2.   Cardiac cath 03/2011:  Hemodynamics: Central Aortic Pressure / Mean Aortic Pressure: 120/69 mmHg; 91 mmHg. LV Pressure / LV End diastolic Pressure: 160 over a delivery; 50 mmHg.  Aortic Stenosis Gradient: Peak 20 mm Hg; mean 13 mmHg  Left Ventriculography: EF: 55-60% Wall Motion: no significant wall motion abnormalities  Coronary Angiographic Data:  Left Main: Angiographic normal; bifurcation into LAD and circumflex  Left Anterior Descending (LAD): Large caliber vessel very tortuous extends around the apex. He is off 3 diagonal branches. The first second diagonal branch of moderate caliber with no severe lesion. The third diagonal branch is a 60-70% ostial lesion. The rest of the LAD system has no significant disease.   Circumflex (LCx): Large caliber tortuous vessel gives rise to a very proximal small OM followed by bifurcating into a second OM the bifurcates itself a moderate caliber. The AV groove circumflex was a continues on his left posterior lateral branch vessel. Th the Circumflex Artery System has no significant disease noted in this vessel.  Right Coronary Artery: moderate caliber vessel with a proximal 50-60% lesion followed by a mild ectatic post stenotic segment. There is a stent in the midportion is widely patent. There is a distally a tubular 30-40% lesion.  The remainder the vessel system includes a posterior ascending artery and a small posterior lateral system with no significant disease; minimal irregularities  Impression:  Widely patent RCA stent, with mild progression of proximal RCA disease to moderate 50-60% lesion.  Progression of ostial D3 lesion now 60-70% moderate severe -- not optimal for PCI given the ostial nature and that it is a 2.0-2.25 mm vessel -- the concern is impinging upon the main channel LAD.  Mild to moderate Aortic Stenosis. Peak gradient of 20-22 mmHg with  a mean gradient of 13 mm mercury.  Well preserved Ejection Fraction of 55-60% with no wall motion abnormalities.  No obvious lesion that would be responsible for the EKG changes noted on treadmill stress test or for resting chest pain that is amenable to PCI.  Plan:  Continue to optimize medical therapy. We'll give prescription for when necessary nitroglycerin sublingual  Plan discharge today after was radial cath care.   Surgical History:  Past Surgical History  Procedure Laterality Date  . Transurethral resection of prostate  ~ 2010  . Colectomy  ~ 2000  . Cardiac catheterization  03/21/2011    Continue to optimize medical therapy  . Cardiac catheterization with pci  08/27/2006    RCA-mid - 2.5x44mm Promus stent  . Nm myoview ltd  03/09/2011    Post-stress EF 76%, ECG positive for ischemia, normal study  . Left heart catheterization with coronary angiogram N/A 03/22/2011    Procedure: LEFT HEART CATHETERIZATION WITH CORONARY ANGIOGRAM;  Surgeon: Leonie Man, MD;  Location: [facility] CATH LAB;  Service: Cardiovascular;  Laterality: N/A;     Home Meds: Prior to Admission medications   Medication Sig Start Date End Date Taking? Authorizing Provider  aspirin EC 81 MG tablet Take 81 mg by mouth daily.   Yes Historical Provider, MD  calcium carbonate (OS-CAL) 600 MG TABS  tablet Take 600 mg by mouth daily.   Yes Historical Provider, MD  cetirizine (ZYRTEC) 10 MG tablet Take 10 mg by mouth daily.   Yes Historical Provider, MD  Cholecalciferol (VITAMIN D3) 2000 UNITS TABS Take 1 tablet by mouth daily.   Yes Historical Provider, MD  clopidogrel (PLAVIX) 75 MG tablet Take 75 mg by mouth daily.   Yes Historical Provider, MD  Coenzyme Q10 (CO Q 10 PO) Take by mouth daily.   Yes Historical Provider, MD  donepezil (ARICEPT) 10 MG tablet Take 10 mg by mouth daily. 11/11/12  Yes Historical Provider, MD  EPIPEN 2-PAK 0.3 MG/0.3ML SOAJ injection Inject 0.3 mLs as directed as directed. 06/01/14  Yes  Historical Provider, MD  ezetimibe-simvastatin (VYTORIN) 10-40 MG per tablet Take 1 tablet by mouth at bedtime.   Yes Historical Provider, MD  fexofenadine (ALLEGRA) 180 MG tablet Take 180 mg by mouth daily.   Yes Historical Provider, MD  fluticasone (FLONASE) 50 MCG/ACT nasal spray Place 2 sprays into both nostrils daily.   Yes Historical Provider, MD  gabapentin (NEURONTIN) 100 MG capsule Take 200 mg by mouth at bedtime.   Yes Historical Provider, MD  ipratropium (ATROVENT) 0.03 % nasal spray Place 2 sprays into both nostrils 3 (three) times daily as needed for rhinitis.   Yes Historical Provider, MD  lansoprazole (PREVACID) 30 MG capsule Take 30 mg by mouth daily. 11/04/12  Yes Historical Provider, MD  Magnesium Sulfate 70 MG CAPS Take 70 mg by mouth daily.   Yes Historical Provider, MD  metFORMIN (GLUCOPHAGE-XR) 500 MG 24 hr tablet Take 250 mg by mouth daily.   Yes Historical Provider, MD  metoprolol succinate (TOPROL-XL) 25 MG 24 hr tablet Take 12.5 mg by mouth 2 (two) times daily.   Yes Historical Provider, MD  Misc Natural Products (OSTEO BI-FLEX ADV JOINT SHIELD PO) Take 1 tablet by mouth daily.   Yes Historical Provider, MD  Multiple Vitamin (MULITIVITAMIN WITH MINERALS) TABS Take 1 tablet by mouth daily.   Yes Historical Provider, MD  Olopatadine HCl 0.2 % SOLN Apply 1 drop to eye daily as needed. 11/11/13  Yes Historical Provider, MD  Omega 3 1200 MG CAPS Take 1,200 mg by mouth 2 (two) times daily.   Yes Historical Provider, MD  OVER THE COUNTER MEDICATION Take 1 tablet by mouth daily. Cinnamon plus chromium 1000mg    Yes Historical Provider, MD  OVER THE COUNTER MEDICATION Take 1 tablet by mouth daily. Peenut for prostate   Yes Historical Provider, MD  oxybutynin (DITROPAN XL) 15 MG 24 hr tablet Take 15 mg by mouth daily.   Yes Historical Provider, MD  Saw Palmetto 450 MG CAPS Take 2 capsules by mouth daily.   Yes Historical Provider, MD  Tamsulosin HCl (FLOMAX) 0.4 MG CAPS Take 0.4 mg by  mouth daily. Take 30 minutes after same meal each day.   Yes Historical Provider, MD  vitamin B-12 (CYANOCOBALAMIN) 1000 MCG tablet Take 1,000 mcg by mouth daily.   Yes Historical Provider, MD  nitroGLYCERIN (NITROSTAT) 0.4 MG SL tablet Place 1 tablet (0.4 mg total) under the tongue every 5 (five) minutes x 3 doses as needed for chest pain. 03/22/11 12/23/12  Geradine Girt, DO    Inpatient Medications:  . aspirin EC  81 mg Oral Daily  . calcium-vitamin D  1 tablet Oral Q breakfast  . clopidogrel  75 mg Oral Daily  . donepezil  10 mg Oral Daily  . simvastatin  40 mg Oral QHS  And  . ezetimibe  10 mg Oral QHS  . fluticasone  2 spray Each Nare Daily  . gabapentin  200 mg Oral QHS  . heparin  5,000 Units Subcutaneous 3 times per day  . insulin aspart  0-9 Units Subcutaneous TID WC  . loratadine  10 mg Oral Daily  . magnesium oxide  400 mg Oral Daily  . metFORMIN  250 mg Oral Q breakfast  . multivitamin with minerals  1 tablet Oral Q supper  . oxybutynin  15 mg Oral Daily  . pantoprazole  40 mg Oral QAC breakfast  . tamsulosin  0.4 mg Oral Daily  . vitamin B-12  1,000 mcg Oral Daily   . sodium chloride 75 mL/hr at 09/07/14 0600    Allergies:  Allergies  Allergen Reactions  . Oraxyl [Doxycycline Hyclate] Swelling    "started swelling in my mouth & tongue; had to go to emergency room; really bad reaction"  . Azithromycin Rash  . Cephalosporins Other (See Comments)    unknown  . Pseudoephedrine Other (See Comments)    unknown  . Amoxicillin Rash  . Codeine Itching and Rash  . Levofloxacin Rash  . Sulfa Antibiotics Rash    History   Social History  . Marital Status: Married    Spouse Name: N/A  . Number of Children: N/A  . Years of Education: N/A   Occupational History  . Not on file.   Social History Main Topics  . Smoking status: Never Smoker   . Smokeless tobacco: Former Systems developer    Types: Chew    Quit date: 01/31/1996  . Alcohol Use: 3.6 oz/week    6 Glasses of  wine per week  . Drug Use: No  . Sexual Activity: Yes   Other Topics Concern  . Not on file   Social History Narrative   Father of 2, grandfather 58.   Exercise for almost 2 hours a day, doing least 20 minutes on the elliptical trainer. He does his to 4 days a week. He'll also does weights and stretching exercises.     Family History  Problem Relation Age of Onset  . Hypertension       Review of Systems: Review of Systems  Constitutional: Positive for weight loss. Negative for fever, chills, malaise/fatigue and diaphoresis.       Weight loss of 10 pounds since starting metformin   HENT: Negative for congestion.   Eyes: Negative for blurred vision, discharge and redness.  Respiratory: Negative for cough, hemoptysis, sputum production, shortness of breath and wheezing.   Cardiovascular: Negative for chest pain, palpitations, orthopnea, claudication, leg swelling and PND.  Gastrointestinal: Negative for heartburn, nausea and vomiting.  Musculoskeletal: Negative for myalgias and falls.  Skin: Negative for rash.  Neurological: Positive for dizziness. Negative for tingling, tremors, sensory change, speech change, focal weakness, weakness and headaches.  Endo/Heme/Allergies: Does not bruise/bleed easily.  Psychiatric/Behavioral: Negative for depression. The patient is not nervous/anxious.   All other systems reviewed and are negative.   Labs:  Recent Labs  09/06/14 1826 09/06/14 2153 09/07/14 0304  TROPONINI <0.03 <0.03 <0.03   Lab Results  Component Value Date   WBC 6.9 09/07/2014   HGB 13.7 09/07/2014   HCT 39.1* 09/07/2014   MCV 92.5 09/07/2014   PLT 132* 09/07/2014    Recent Labs Lab 09/07/14 0304  NA 143  K 4.2  CL 110  CO2 29  BUN 15  CREATININE 0.82  CALCIUM 8.5*  GLUCOSE 121*  Lab Results  Component Value Date   CHOL 127 03/22/2011   HDL 48 03/22/2011   LDLCALC 52 03/22/2011   TRIG 133 03/22/2011   No results found for:  DDIMER  Radiology/Studies:  Dg Chest Port 1 View  09/06/2014   CLINICAL DATA:  Dizziness and bradycardia  EXAM: PORTABLE CHEST - 1 VIEW  COMPARISON:  March 21, 2011  FINDINGS: Lungs are clear. Heart size and pulmonary vascularity are normal. No adenopathy. No bone lesions.  IMPRESSION: No edema or consolidation.   Electronically Signed   By: Lowella Grip III M.D.   On: 09/06/2014 19:13    EKG: NSR, 90 bpm, left axis deviation, frequent PVC's, nonspecific inferior st/t changes    Weights: Filed Weights   09/06/14 1823 09/06/14 2145  Weight: 175 lb (79.379 kg) 167 lb 15.9 oz (76.2 kg)     Physical Exam: Blood pressure 105/49, pulse 85, temperature 97.8 F (36.6 C), temperature source Oral, resp. rate 10, height 5\' 10"  (1.778 m), weight 167 lb 15.9 oz (76.2 kg), SpO2 94 %. Body mass index is 24.1 kg/(m^2). General: Well developed, well nourished, in no acute distress. Head: Normocephalic, atraumatic, sclera non-icteric, no xanthomas, nares are without discharge.  Neck: Negative for carotid bruits. JVD not elevated. Lungs: Clear bilaterally to auscultation without wheezes, rales, or rhonchi. Breathing is unlabored. Heart: Irregular, with S1 S2. II/VI systolic murmurs at RUSB. No rubs or gallops appreciated. Pulse 85 as each beat does count (sinus and PVC).  Abdomen: Soft, non-tender, non-distended with normoactive bowel sounds. No hepatomegaly. No rebound/guarding. No obvious abdominal masses. Msk:  Strength and tone appear normal for age. Extremities: No clubbing or cyanosis. No edema.  Distal pedal pulses are 2+ and equal bilaterally. Neuro: Alert and oriented X 3. No facial asymmetry. No focal deficit. Moves all extremities spontaneously. Psych:  Responds to questions appropriately with a normal affect.    Assessment and Plan:  81 y.o. male with h/o CAD s/p prior PCI, moderate aortic stenosis, bilateral carotid artery disease, DM2, HTN, HLD, and GERD who presented to [facility] on 8/7  with 1 week history of dizziness, found to be in ventricular bigeminy.  1. Ventricular bigeminy: -Patient with new onset frequent PVCs and ventricular bigeminy -The monitor is actually correct and the patient's heart rate is actually in the mid 80's to 90's and the ventricular beats count -Would add back metoprolol at even higher dose in an attempt to decrease his ventricular ectopy  -Lopressor 25 mg bid -He ultimately may need an EP evaluation  -Check echo to evaluate LV function -Will need 24 hour Holter monitor to quantify number of ectopic ventricular beats  -Will need outpatient ischemic evaluation  -Electrolytes (K+ and Mg) and TSH all ok  2. Dizziness: -Patient reports symptoms of possible orthostasis vs vertigo (has known vertigo and has an appointment with ENT this week) -Check orthostatics  -Possibly related to #1 -Exercises 3 x weekly, previously without any symptoms. Now with orthostatic symptoms after doing leg press  3. CAD: -No angina -Will need outpatient ischemic evaluation as above -Add back beta blocker as above -Aspirin was discontinued at his last outpatient office visit on 05/18/2014 -Continue Plavix  4. Moderate aortic stenosis: -Check echo as above  5. HTN: -Slightly soft -Lopressor as above  6. HLD: -Simvastatin    Signed, Christell Faith, PA-C Pager: [phone] 09/07/2014, 8:48 AM

## 2014-09-07 NOTE — Progress Notes (Signed)
*  PRELIMINARY RESULTS* Echocardiogram 2D Echocardiogram has been performed.  Victor [name] 09/07/2014, 1:52 PM

## 2014-09-07 NOTE — Progress Notes (Signed)
[place] [facility] [place] NAME: Victor [name]    MR#:  [id number]  DATE OF BIRTH:  02-10-1931  SUBJECTIVE:  CHIEF COMPLAINT:   Chief Complaint  Patient presents with  . Bradycardia   feels much better today.  REVIEW OF SYSTEMS:  CONSTITUTIONAL: No fever, fatigue or weakness.  EYES: No blurred or double vision.  EARS, NOSE, AND THROAT: No tinnitus or ear pain.  RESPIRATORY: No cough, shortness of breath, wheezing or hemoptysis.  CARDIOVASCULAR: No chest pain, orthopnea, edema.  GASTROINTESTINAL: No nausea, vomiting, diarrhea or abdominal pain.  GENITOURINARY: No dysuria, hematuria.  ENDOCRINE: No polyuria, nocturia,  HEMATOLOGY: No anemia, easy bruising or bleeding SKIN: No rash or lesion. MUSCULOSKELETAL: No joint pain or arthritis.   NEUROLOGIC: No tingling, numbness, weakness.  PSYCHIATRY: No anxiety or depression.   ROS  DRUG ALLERGIES:   Allergies  Allergen Reactions  . Oraxyl [Doxycycline Hyclate] Swelling    "started swelling in my mouth & tongue; had to go to emergency room; really bad reaction"  . Azithromycin Rash  . Cephalosporins Other (See Comments)    unknown  . Pseudoephedrine Other (See Comments)    unknown  . Amoxicillin Rash  . Codeine Itching and Rash  . Levofloxacin Rash  . Sulfa Antibiotics Rash    VITALS:  Blood pressure 118/51, pulse 73, temperature 98.5 F (36.9 C), temperature source Oral, resp. rate 27, height 5\' 10"  (1.778 m), weight 76.2 kg (167 lb 15.9 oz), SpO2 92 %.  PHYSICAL EXAMINATION:  GENERAL: 81 y.o.-year-old patient lying in the bed with no acute distress.  EYES: Pupils equal, round, reactive to light and accommodation. No scleral icterus. Extraocular muscles intact.  HEENT: Head atraumatic, normocephalic. Oropharynx and nasopharynx clear.  NECK: Supple, no jugular venous distention. No thyroid enlargement, no tenderness.  LUNGS: Normal breath sounds bilaterally, no  wheezing, rales,rhonchi or crepitation. No use of accessory muscles of respiration.  CARDIOVASCULAR: S1, S2 normal. No murmurs, rubs, or gallops.  ABDOMEN: Soft, nontender, nondistended. Bowel sounds present. No organomegaly or mass.  EXTREMITIES: No pedal edema, cyanosis, or clubbing.  NEUROLOGIC: Cranial nerves II through XII are intact. Muscle strength 5/5 in all extremities. Sensation intact. Gait not checked.  PSYCHIATRIC: The patient is alert and oriented x 3.  SKIN: No obvious rash, lesion, or ulcer.   Physical Exam LABORATORY PANEL:   CBC  Recent Labs Lab 09/07/14 0304  WBC 6.9  HGB 13.7  HCT 39.1*  PLT 132*   ------------------------------------------------------------------------------------------------------------------  Chemistries   Recent Labs Lab 09/06/14 1826 09/07/14 0304  NA 140 143  K 4.2 4.2  CL 104 110  CO2 28 29  GLUCOSE 169* 121*  BUN 19 15  CREATININE 0.97 0.82  CALCIUM 9.5 8.5*  MG 2.1  --    ------------------------------------------------------------------------------------------------------------------  Cardiac Enzymes  Recent Labs Lab 09/07/14 0304 09/07/14 0814  TROPONINI <0.03 <0.03   ------------------------------------------------------------------------------------------------------------------  RADIOLOGY:  Dg Chest Port 1 View  09/06/2014   CLINICAL DATA:  Dizziness and bradycardia  EXAM: PORTABLE CHEST - 1 VIEW  COMPARISON:  March 21, 2011  FINDINGS: Lungs are clear. Heart size and pulmonary vascularity are normal. No adenopathy. No bone lesions.  IMPRESSION: No edema or consolidation.   Electronically Signed   By: Lowella Grip III M.D.   On: 09/06/2014 19:13    ASSESSMENT AND PLAN:    1 symptomatic bradycardia; suspect sick sinus syndrome,   Initially suspected due to betablocker- stopped metoprolol.  monitor on  telemetry, use atropine as needed,appreciated cardiology consult, check TSH, echocardiogram of  the heart Hr continues to be in 30's monitor in icu   Cardiology found some ventricular bigemini on tele, so startd on metoprolol higher dose- and now his HR is in 70-80, he feels better. Transfer to tele floor.  2. Hypertension; stable, monitor blood pressure  3. Coronary artery disease continue aspirin,  metoprolol  4. Hyperlipidemia continue Vytorin as taking at home  5. Diabetes type 2: Low-dose sliding scale continue metformin  6. GERD: Continue PPIs  7. Miscellaneous heparin for DVT prophylaxis    All the records are reviewed and case discussed with Care Management/Social Workerr. Management plans discussed with the patient, family and they are in agreement.  CODE STATUS: full  TOTAL TIME TAKING CARE OF THIS PATIENT: 35 minutes.     POSSIBLE D/C IN 1-2 DAYS, DEPENDING ON CLINICAL CONDITION.   Vaughan Basta M.D on 09/07/2014   Between 7am to 6pm - Pager - [phone]  After 6pm go to [email or web address] - password [email or web address] [facility]  [facility]  Office  [phone]  CC: Primary care physician; [facility], Chrissie Noa, MD

## 2014-09-08 ENCOUNTER — Telehealth: Payer: Self-pay

## 2014-09-08 ENCOUNTER — Other Ambulatory Visit: Payer: Self-pay

## 2014-09-08 ENCOUNTER — Inpatient Hospital Stay (HOSPITAL_COMMUNITY)
Admit: 2014-09-08 | Discharge: 2014-09-08 | Disposition: A | Discharge status: Home / Self Care | Payer: Medicare Other | Attending: Physician Assistant | Admitting: Physician Assistant

## 2014-09-08 DIAGNOSIS — I499 Cardiac arrhythmia, unspecified: Principal | ICD-10-CM

## 2014-09-08 DIAGNOSIS — I498 Other specified cardiac arrhythmias: Secondary | ICD-10-CM

## 2014-09-08 LAB — GLUCOSE, CAPILLARY
Glucose-Capillary: 138 mg/dL — ABNORMAL HIGH (ref 65–99)
Glucose-Capillary: 205 mg/dL — ABNORMAL HIGH (ref 65–99)

## 2014-09-08 MED ORDER — CETIRIZINE HCL 10 MG PO TABS
10.0000 mg | ORAL_TABLET | Freq: Every day | ORAL | Status: DC
  Administered 2014-09-08: 10 mg via ORAL
  Filled 2014-09-08: qty 1

## 2014-09-08 MED ORDER — DIPHENHYDRAMINE HCL 25 MG PO CAPS
ORAL_CAPSULE | ORAL | Status: AC
  Administered 2014-09-08: 10:00:00
  Filled 2014-09-08: qty 1

## 2014-09-08 MED ORDER — SIMVASTATIN 40 MG PO TABS: 40.0000 mg | ORAL_TABLET | Freq: Every day | ORAL | Status: DC

## 2014-09-08 MED ORDER — EZETIMIBE 10 MG PO TABS: 10.0000 mg | ORAL_TABLET | Freq: Every day | ORAL | Status: DC

## 2014-09-08 MED ORDER — METOPROLOL TARTRATE 25 MG PO TABS: 25.0000 mg | ORAL_TABLET | Freq: Two times a day (BID) | ORAL | Status: DC

## 2014-09-08 MED ORDER — DIPHENHYDRAMINE HCL 25 MG PO CAPS: 25.0000 mg | ORAL_CAPSULE | Freq: Three times a day (TID) | ORAL | Status: DC | PRN

## 2014-09-08 NOTE — Progress Notes (Signed)
Up in hallway for 2 laps.  Slightly light headed at end of walk.  HR 84/min. Await holtor monitor placement before discharge

## 2014-09-08 NOTE — Plan of Care (Signed)
Problem: Phase I Progression Outcomes Goal: Pain controlled with appropriate interventions Outcome: Completed/Met Date Met:  09/08/14 Pain free Goal: OOB as tolerated unless otherwise ordered Outcome: Completed/Met Date Met:  09/08/14 Up in hallways 2 laps. Mild dizziness at end of walk Goal: Initial discharge plan identified Outcome: Completed/Met Date Met:  09/08/14 Home with wife. holtor monitor on.  Follow up appts made Goal: Voiding-avoid urinary catheter unless indicated Outcome: Completed/Met Date Met:  09/08/14 Voids without problem  Problem: Phase II Progression Outcomes Goal: Discharge plan established Outcome: Completed/Met Date Met:  09/08/14 Home with wife

## 2014-09-08 NOTE — Discharge Summary (Signed)
[facility] [place] NAME: Victor [name]    MR#:  [id number]  DATE OF BIRTH:  27-Dec-1931  DATE OF ADMISSION:  09/06/2014 ADMITTING PHYSICIAN: Dustin Flock, MD  DATE OF DISCHARGE:09/08/2014  PRIMARY CARE PHYSICIAN: FELDPAUSCH, DALE E, MD    ADMISSION DIAGNOSIS:  Ventricular ectopy [I49.3] Symptomatic bradycardia [R00.1]  DISCHARGE DIAGNOSIS:  Active Problems:   Bradycardia   Ventricular bigemini- under control now with metoprolol 25 mg BID.  SECONDARY DIAGNOSIS:   Past Medical History  Diagnosis Date  . CAD S/P percutaneous coronary angioplasty 07/2006    PCI to RCA - Promus DES 2.5 mm x 23 mm; 2D ECHO - EF >55%, moderate calcification of the aortic valve leaflets  . Aortic valve stenosis, moderate 08/2010; 12/05/2012    Mild to moderate Aortic Stenosis;; Stable in 2014 --> EF 55-60%, Mod AoV Calcification with mild-moderate stenosis; mild MR. (Stable)  . Arthritis   . Diabetes mellitus   . High cholesterol   . Hypertension     "from the diabetes"  . Angina   . GERD (gastroesophageal reflux disease)   . BPH (benign prostatic hypertrophy)   . Bilateral carotid artery disease     CAROTID DOPPLER,05/21/2008 - Right and left ICA-0-49% diameter reduction, left CCA-0-49% diameter reductiion    HOSPITAL COURSE:   1 symptomatic bradycardia; suspect sick sinus syndrome,  Initially suspected due to betablocker- stopped metoprolol. monitor on telemetry,   appreciated cardiology consult, normal TSH, echocardiogram of the heart Cardiology found some ventricular bigemini on tele, so startd on metoprolol higher dose- and now his HR is in 70-80, he feels better.    Discharge home after having him walk, follow in Cardiology clinic.  2. Hypertension; stable, monitor blood pressure  3. Coronary artery disease continue aspirin, metoprolol  4. Hyperlipidemia continue Vytorin as taking at home  5. Diabetes type 2: Low-dose sliding  scale continue metformin  6. GERD: Continue PPIs  7. Miscellaneous heparin for DVT prophylaxis   DISCHARGE CONDITIONS:   Stable.  CONSULTS OBTAINED:  Treatment Team:  Wellington Hampshire, MD  DRUG ALLERGIES:   Allergies  Allergen Reactions  . Oraxyl [Doxycycline Hyclate] Swelling    "started swelling in my mouth & tongue; had to go to emergency room; really bad reaction"  . Azithromycin Rash  . Cephalosporins Other (See Comments)    unknown  . Pseudoephedrine Other (See Comments)    unknown  . Amoxicillin Rash  . Codeine Itching and Rash  . Levofloxacin Rash  . Sulfa Antibiotics Rash    DISCHARGE MEDICATIONS:   Current Discharge Medication List    START taking these medications   Details  ezetimibe (ZETIA) 10 MG tablet Take 1 tablet (10 mg total) by mouth at bedtime. Qty: 30 tablet, Refills: 0    metoprolol tartrate (LOPRESSOR) 25 MG tablet Take 1 tablet (25 mg total) by mouth 2 (two) times daily. Qty: 60 tablet, Refills: 0    simvastatin (ZOCOR) 40 MG tablet Take 1 tablet (40 mg total) by mouth at bedtime. Qty: 30 tablet, Refills: 0      CONTINUE these medications which have NOT CHANGED   Details  aspirin EC 81 MG tablet Take 81 mg by mouth daily.    calcium carbonate (OS-CAL) 600 MG TABS tablet Take 600 mg by mouth daily.    cetirizine (ZYRTEC) 10 MG tablet Take 10 mg by mouth daily.    Cholecalciferol (VITAMIN D3) 2000 UNITS TABS Take 1 tablet by mouth  daily.    clopidogrel (PLAVIX) 75 MG tablet Take 75 mg by mouth daily.    Coenzyme Q10 (CO Q 10 PO) Take by mouth daily.    donepezil (ARICEPT) 10 MG tablet Take 10 mg by mouth daily.    EPIPEN 2-PAK 0.3 MG/0.3ML SOAJ injection Inject 0.3 mLs as directed as directed.    ezetimibe-simvastatin (VYTORIN) 10-40 MG per tablet Take 1 tablet by mouth at bedtime.    fexofenadine (ALLEGRA) 180 MG tablet Take 180 mg by mouth daily.    fluticasone (FLONASE) 50 MCG/ACT nasal spray Place 2 sprays into both  nostrils daily.    gabapentin (NEURONTIN) 100 MG capsule Take 200 mg by mouth at bedtime.    ipratropium (ATROVENT) 0.03 % nasal spray Place 2 sprays into both nostrils 3 (three) times daily as needed for rhinitis.    lansoprazole (PREVACID) 30 MG capsule Take 30 mg by mouth daily.    Magnesium Sulfate 70 MG CAPS Take 70 mg by mouth daily.    metFORMIN (GLUCOPHAGE-XR) 500 MG 24 hr tablet Take 250 mg by mouth daily.    Misc Natural Products (OSTEO BI-FLEX ADV JOINT SHIELD PO) Take 1 tablet by mouth daily.    Multiple Vitamin (MULITIVITAMIN WITH MINERALS) TABS Take 1 tablet by mouth daily.    Olopatadine HCl 0.2 % SOLN Apply 1 drop to eye daily as needed.    Omega 3 1200 MG CAPS Take 1,200 mg by mouth 2 (two) times daily.    oxybutynin (DITROPAN XL) 15 MG 24 hr tablet Take 15 mg by mouth daily.    Saw Palmetto 450 MG CAPS Take 2 capsules by mouth daily.    Tamsulosin HCl (FLOMAX) 0.4 MG CAPS Take 0.4 mg by mouth daily. Take 30 minutes after same meal each day.    vitamin B-12 (CYANOCOBALAMIN) 1000 MCG tablet Take 1,000 mcg by mouth daily.      STOP taking these medications     metoprolol succinate (TOPROL-XL) 25 MG 24 hr tablet      OVER THE COUNTER MEDICATION      OVER THE COUNTER MEDICATION      nitroGLYCERIN (NITROSTAT) 0.4 MG SL tablet          DISCHARGE INSTRUCTIONS:    Follow with cardiology clinic.  If you experience worsening of your admission symptoms, develop shortness of breath, life threatening emergency, suicidal or homicidal thoughts you must seek medical attention immediately by calling 911 or calling your MD immediately  if symptoms less severe.  You Must read complete instructions/literature along with all the possible adverse reactions/side effects for all the Medicines you take and that have been prescribed to you. Take any new Medicines after you have completely understood and accept all the possible adverse reactions/side effects.   Please  note  You were cared for by a hospitalist during your hospital stay. If you have any questions about your discharge medications or the care you received while you were in the hospital after you are discharged, you can call the unit and asked to speak with the hospitalist on call if the hospitalist that took care of you is not available. Once you are discharged, your primary care physician will handle any further medical issues. Please note that NO REFILLS for any discharge medications will be authorized once you are discharged, as it is imperative that you return to your primary care physician (or establish a relationship with a primary care physician if you do not have one) for your aftercare needs  so that they can reassess your need for medications and monitor your lab values.    Today   CHIEF COMPLAINT:   Chief Complaint  Patient presents with  . Bradycardia    HISTORY OF PRESENT ILLNESS:  Victor [name]  is a 81 y.o. male with a known history of coronary artery disease, hypertension, GERD, hypercholesterolemia, memory loss, allergic rhinitis, BPH, and restless leg syndrome presents to the emergency room complaining of 3-4 days of feeling dizzy for about 1 week. Patient whenever he stood up and try to ambulate his symptoms would get worse. Patient did not have any syncopal episode. He does not have any chest pain or shortness of breath. Patient emergency room was noted to have bradycardia and a heart rate in the 40s. The ER physician spoke to his cardiologist they recommended holding his metoprolol and hospitalization.  VITAL SIGNS:  Blood pressure 127/56, pulse 37, temperature 98.3 F (36.8 C), temperature source Oral, resp. rate 18, height 5\' 10"  (1.778 m), weight 76.2 kg (167 lb 15.9 oz), SpO2 92 %.  I/O:   Intake/Output Summary (Last 24 hours) at 09/08/14 1302 Last data filed at 09/08/14 1135  Gross per 24 hour  Intake   1585 ml  Output   1300 ml  Net    285 ml    PHYSICAL  EXAMINATION:    GENERAL: 81 y.o.-year-old patient lying in the bed with no acute distress.  EYES: Pupils equal, round, reactive to light and accommodation. No scleral icterus. Extraocular muscles intact.  HEENT: Head atraumatic, normocephalic. Oropharynx and nasopharynx clear.  NECK: Supple, no jugular venous distention. No thyroid enlargement, no tenderness.  LUNGS: Normal breath sounds bilaterally, no wheezing, rales,rhonchi or crepitation. No use of accessory muscles of respiration.  CARDIOVASCULAR: S1, S2 normal. No murmurs, rubs, or gallops.  ABDOMEN: Soft, nontender, nondistended. Bowel sounds present. No organomegaly or mass.  EXTREMITIES: No pedal edema, cyanosis, or clubbing.  NEUROLOGIC: Cranial nerves II through XII are intact. Muscle strength 5/5 in all extremities. Sensation intact. Gait not checked.  PSYCHIATRIC: The patient is alert and oriented x 3.  SKIN: No obvious rash, lesion, or ulcer.   DATA REVIEW:   CBC  Recent Labs Lab 09/07/14 0304  WBC 6.9  HGB 13.7  HCT 39.1*  PLT 132*    Chemistries   Recent Labs Lab 09/06/14 1826 09/07/14 0304  NA 140 143  K 4.2 4.2  CL 104 110  CO2 28 29  GLUCOSE 169* 121*  BUN 19 15  CREATININE 0.97 0.82  CALCIUM 9.5 8.5*  MG 2.1  --     Cardiac Enzymes  Recent Labs Lab 09/07/14 0814  TROPONINI <0.03    Microbiology Results  Results for orders placed or performed during the hospital encounter of 09/06/14  MRSA PCR Screening     Status: None   Collection Time: 09/06/14  9:47 PM  Result Value Ref Range Status   MRSA by PCR NEGATIVE NEGATIVE Final    Comment:        The GeneXpert MRSA Assay (FDA approved for NASAL specimens only), is one component of a comprehensive MRSA colonization surveillance program. It is not intended to diagnose MRSA infection nor to guide or monitor treatment for MRSA infections.     RADIOLOGY:  Dg Chest Port 1 View  09/06/2014   CLINICAL DATA:  Dizziness and  bradycardia  EXAM: PORTABLE CHEST - 1 VIEW  COMPARISON:  March 21, 2011  FINDINGS: Lungs are clear. Heart size and pulmonary vascularity  are normal. No adenopathy. No bone lesions.  IMPRESSION: No edema or consolidation.   Electronically Signed   By: Lowella Grip III M.D.   On: 09/06/2014 19:13     Management plans discussed with the patient, family and they are in agreement.  CODE STATUS:     Code Status Orders        Start     Ordered   09/06/14 2030  Full code   Continuous     09/06/14 2030      TOTAL TIME TAKING CARE OF THIS PATIENT: 35 minutes.    Vaughan Basta M.D on 09/08/2014 at 1:02 PM  Between 7am to 6pm - Pager - [phone]  After 6pm go to [email or web address] - password [email or web address] [facility]  [facility]  Office  [phone]  CC: Primary care physician; [facility], Chrissie Noa, MD

## 2014-09-08 NOTE — Telephone Encounter (Signed)
-----   Message from Arie Sabina sent at 09/08/2014  1:49 PM EDT ----- Regarding: tcm/ph Dr. Fletcher Anon 09/14/14

## 2014-09-08 NOTE — Telephone Encounter (Signed)
Pt son calling to ask if it is ok to take [place] to the beach next [place]. Suggested they speak with physicians at the hospital for recommendation. Stated I was not able to make that decision.  Pt son verbalized understanding

## 2014-09-08 NOTE — Care Management Important Message (Signed)
Important Message  Patient Details  Name: Victor [name] MRN: [id number] Date of Birth: [birth year]   Medicare Important Message Given:  Yes-second notification given    Darius Bump Allmond 09/08/2014, 8:48 AM

## 2014-09-08 NOTE — Progress Notes (Signed)
Ryan PA for Dr Rockey Situ in to speak at length with pt and his son regarding needed cardiac testing and follow up. Pt educated regarding holtor monitor, stress test, and possible cardiac cath.

## 2014-09-08 NOTE — Telephone Encounter (Signed)
S/w pt wife, Gwinda Passe, and pt daughter, Abigail Butts, regarding 24 hour holter monitor ordered by Christell Faith, PA-C Daughter states pt still in ICU and not sure when pt is to be discharged. Suggested to daughter and wife that upon discharge, have pt come to our office for holter monitor placement. Provided daughter with my office number if they have any questions. Daughter verbalized understanding with no further questions at this time.

## 2014-09-08 NOTE — Telephone Encounter (Signed)
Patient contacted regarding discharge from [name] on 09/08/14.  Patient understands to follow up with Dr. Fletcher Anon on 09/14/14 at 11:45 at [facility]. Patient understands discharge instructions? yes Patient understands medications and regiment? yes Patient understands to bring all medications to this visit? yes

## 2014-09-08 NOTE — Progress Notes (Signed)
    Reviewed patient's telemetry he remains in frequent ventricular bigeminy with episodes of sinus rhythm. Heart rate in the 60's to 70's. Lowest rate of 55 bpm. Patient asymptomatic this morning. Would recommend outpatient ischemic evaluation given new onset of ventricular ectopy as well as evaluation by EP and 24 hour Holter monitor to quantify ventricular ectopy . Would continue current dose of Lopressor at 25 mg bid.    Christell Faith, PA-C 09/08/2014 10:58 AM

## 2014-09-14 ENCOUNTER — Encounter: Payer: Self-pay | Admitting: Cardiovascular Disease

## 2014-09-14 ENCOUNTER — Ambulatory Visit (INDEPENDENT_AMBULATORY_CARE_PROVIDER_SITE_OTHER): Payer: Medicare Other | Admitting: Cardiovascular Disease

## 2014-09-14 VITALS — BP 100/64 | HR 71 | Ht 70.0 in | Wt 169.2 lb

## 2014-09-14 DIAGNOSIS — I2510 Atherosclerotic heart disease of native coronary artery without angina pectoris: Secondary | ICD-10-CM

## 2014-09-14 DIAGNOSIS — R0602 Shortness of breath: Secondary | ICD-10-CM

## 2014-09-14 DIAGNOSIS — Z9861 Coronary angioplasty status: Secondary | ICD-10-CM | POA: Diagnosis not present

## 2014-09-14 DIAGNOSIS — I350 Nonrheumatic aortic (valve) stenosis: Secondary | ICD-10-CM | POA: Diagnosis not present

## 2014-09-14 DIAGNOSIS — I493 Ventricular premature depolarization: Secondary | ICD-10-CM | POA: Diagnosis not present

## 2014-09-14 NOTE — Progress Notes (Signed)
Primary cardiologist: Dr. [name]  HPI  81 y.o. male who is here today for a follow-up visit after recent hospitalization at [facility] for frequent PVCs. He has known history of CAD s/p prior PCI, moderate aortic stenosis, bilateral carotid artery disease, DM2, HTN, HLD, and GERD . He has known CAD s/p PCI to RCA in 2008. Echo at that time showed EF >55% with moderate calcification of the aortic valve leaflets. Follow up echo in 2012 showed mild to moderate aortic stenosis. He underwent cardiac cath in 2013 in the setting of positive EKG (inferior TWI) with negative nuclear stress test. No ischemic symptoms at that time. Cardiac cath showed widely patient RCA stent with mild progression of proximal RCA disease to moderate 50-60% lesion. There was progression of ostial D3 lesion to 60-70%, not optimal for PCI given the ostial nature and size of the vessel. EF 55-60% with no WMA. It was recommended he continue optimal medical therapy.  Patient exercises regularly 3 times weekly, usually without issues. However, recently he had symptoms of dizziness without syncope. Comment happening to him more frequently as of late. He initially thought this was his vertigo, but took his pulse and felt it to be in the 40's so he presented to [facility]. No syncopal episode. No chest pain, SOB, nausea, vomiting, or diaphoresis.   Upon his arrival to [facility] he was thought to be bradycardic in the 40's, though his underlying rhythm was sinus rhythm with ventricular bigeminy making his heart rate in the mid 80's. He was asymptomatic. The dose of metoprolol was increased to 25 mg twice daily. Repeat echocardiogram showed normal LV systolic function with moderate aortic stenosis. He has been doing reasonably well but occasionally he has low blood pressure readings. Dizziness has improved but he has not resumed his physical activities. He denies any chest pain.  Allergies  Allergen Reactions  . Oraxyl [Doxycycline Hyclate] Swelling   "started swelling in my mouth & tongue; had to go to emergency room; really bad reaction"  . Azithromycin Rash  . Cephalosporins Other (See Comments)    unknown  . Pseudoephedrine Other (See Comments)    unknown  . Amoxicillin Rash  . Codeine Itching and Rash  . Levofloxacin Rash  . Sulfa Antibiotics Rash     Current Outpatient Prescriptions on File Prior to Visit  Medication Sig Dispense Refill  . aspirin EC 81 MG tablet Take 81 mg by mouth daily.    . calcium carbonate (OS-CAL) 600 MG TABS tablet Take 600 mg by mouth daily.    . cetirizine (ZYRTEC) 10 MG tablet Take 10 mg by mouth daily.    . Cholecalciferol (VITAMIN D3) 2000 UNITS TABS Take 1 tablet by mouth daily.    . clopidogrel (PLAVIX) 75 MG tablet Take 75 mg by mouth daily.    . Coenzyme Q10 (CO Q 10 PO) Take by mouth daily.    Marland Kitchen donepezil (ARICEPT) 10 MG tablet Take 10 mg by mouth daily.    Marland Kitchen EPIPEN 2-PAK 0.3 MG/0.3ML SOAJ injection Inject 0.3 mLs as directed as directed.    . ezetimibe (ZETIA) 10 MG tablet Take 1 tablet (10 mg total) by mouth at bedtime. 30 tablet 0  . ezetimibe-simvastatin (VYTORIN) 10-40 MG per tablet Take 1 tablet by mouth at bedtime.    . fexofenadine (ALLEGRA) 180 MG tablet Take 180 mg by mouth daily.    . fluticasone (FLONASE) 50 MCG/ACT nasal spray Place 2 sprays into both nostrils daily.    Marland Kitchen gabapentin (NEURONTIN)  100 MG capsule Take 200 mg by mouth at bedtime.    Marland Kitchen ipratropium (ATROVENT) 0.03 % nasal spray Place 2 sprays into both nostrils 3 (three) times daily as needed for rhinitis.    Marland Kitchen lansoprazole (PREVACID) 30 MG capsule Take 30 mg by mouth daily.    . Magnesium Sulfate 70 MG CAPS Take 70 mg by mouth daily.    . metFORMIN (GLUCOPHAGE-XR) 500 MG 24 hr tablet Take 250 mg by mouth daily.    . metoprolol tartrate (LOPRESSOR) 25 MG tablet Take 1 tablet (25 mg total) by mouth 2 (two) times daily. 60 tablet 0  . Misc Natural Products (OSTEO BI-FLEX ADV JOINT SHIELD PO) Take 1 tablet by mouth  daily.    . Multiple Vitamin (MULITIVITAMIN WITH MINERALS) TABS Take 1 tablet by mouth daily.    . Olopatadine HCl 0.2 % SOLN Apply 1 drop to eye daily as needed.    . Omega 3 1200 MG CAPS Take 1,200 mg by mouth 2 (two) times daily.    Marland Kitchen oxybutynin (DITROPAN XL) 15 MG 24 hr tablet Take 15 mg by mouth daily.    . Saw Palmetto 450 MG CAPS Take 2 capsules by mouth daily.    . simvastatin (ZOCOR) 40 MG tablet Take 1 tablet (40 mg total) by mouth at bedtime. 30 tablet 0  . Tamsulosin HCl (FLOMAX) 0.4 MG CAPS Take 0.4 mg by mouth daily. Take 30 minutes after same meal each day.    . vitamin B-12 (CYANOCOBALAMIN) 1000 MCG tablet Take 1,000 mcg by mouth daily.     No current facility-administered medications on file prior to visit.     Past Medical History  Diagnosis Date  . CAD S/P percutaneous coronary angioplasty 07/2006    PCI to RCA - Promus DES 2.5 mm x 23 mm; 2D ECHO - EF >55%, moderate calcification of the aortic valve leaflets  . Aortic valve stenosis, moderate 08/2010; 12/05/2012    Mild to moderate Aortic Stenosis;; Stable in 2014 --> EF 55-60%, Mod AoV Calcification with mild-moderate stenosis; mild MR. (Stable)  . Arthritis   . Diabetes mellitus   . High cholesterol   . Hypertension     "from the diabetes"  . Angina   . GERD (gastroesophageal reflux disease)   . BPH (benign prostatic hypertrophy)   . Bilateral carotid artery disease     CAROTID DOPPLER,05/21/2008 - Right and left ICA-0-49% diameter reduction, left CCA-0-49% diameter reductiion     Past Surgical History  Procedure Laterality Date  . Transurethral resection of prostate  ~ 2010  . Colectomy  ~ 2000  . Cardiac catheterization  03/21/2011    Continue to optimize medical therapy  . Cardiac catheterization with pci  08/27/2006    RCA-mid - 2.5x21mm Promus stent  . Nm myoview ltd  03/09/2011    Post-stress EF 76%, ECG positive for ischemia, normal study  . Left heart catheterization with coronary angiogram N/A  03/22/2011    Procedure: LEFT HEART CATHETERIZATION WITH CORONARY ANGIOGRAM;  Surgeon: Leonie Man, MD;  Location: [facility] CATH LAB;  Service: Cardiovascular;  Laterality: N/A;     Family History  Problem Relation Age of Onset  . Family history unknown: Yes     Social History   Social History  . Marital Status: Married    Spouse Name: N/A  . Number of Children: N/A  . Years of Education: N/A   Occupational History  . Not on file.   Social History Main Topics  .  Smoking status: Never Smoker   . Smokeless tobacco: Former Systems developer    Types: Chew    Quit date: 01/31/1996  . Alcohol Use: 3.6 oz/week    6 Glasses of wine per week  . Drug Use: No  . Sexual Activity: Yes   Other Topics Concern  . Not on file   Social History Narrative   Father of 2, grandfather 69.   Exercise for almost 2 hours a day, doing least 20 minutes on the elliptical trainer. He does his to 4 days a week. He'll also does weights and stretching exercises.      PHYSICAL EXAM   BP 100/64 mmHg  Pulse 71  Ht 5\' 10"  (1.778 m)  Wt 169 lb 4 oz (76.771 kg)  BMI 24.28 kg/m2 Constitutional: He is oriented to person, place, and time. He appears well-developed and well-nourished. No distress.  HENT: No nasal discharge.  Head: Normocephalic and atraumatic.  Eyes: Pupils are equal and round.  No discharge. Neck: Normal range of motion. Neck supple. No JVD present. No thyromegaly present.  Cardiovascular: Normal rate, regular rhythm, normal heart sounds. Exam reveals no gallop and no friction rub. There is a 3/6 systolic ejection murmur which is mid peaking in the aortic area.  Pulmonary/Chest: Effort normal and breath sounds normal. No stridor. No respiratory distress. He has no wheezes. He has no rales. He exhibits no tenderness.  Abdominal: Soft. Bowel sounds are normal. He exhibits no distension. There is no tenderness. There is no rebound and no guarding.  Musculoskeletal: Normal range of motion. He exhibits  no edema and no tenderness.  Neurological: He is alert and oriented to person, place, and time. Coordination normal.  Skin: Skin is warm and dry. No rash noted. He is not diaphoretic. No erythema. No pallor.  Psychiatric: He has a normal mood and affect. His behavior is normal. Judgment and thought content normal.       YHC:WCBJS  Rhythm  WITHIN NORMAL LIMITS   ASSESSMENT AND PLAN

## 2014-09-14 NOTE — Assessment & Plan Note (Signed)
Given recent increase in PVCs with exercise intolerance, I think it's important to rule out an ischemic etiology. Thus, I requested a pharmacologic nuclear stress test. He is not able to exercise on a treadmill. I want him to follow-up with Dr. [name] after his stress test to decide if further management is needed for his PVCs.

## 2014-09-14 NOTE — Patient Instructions (Addendum)
Medication Instructions:  Your physician recommends that you continue on your current medications as directed. Please refer to the Current Medication list given to you today.   Labwork: none  Testing/Procedures: Your physician has requested that you have a lexiscan myoview. For further information please visit [email or web address]. Please follow instruction sheet, as given.  [place]  Your caregiver has ordered a Stress Test with nuclear imaging. The purpose of this test is to evaluate the blood supply to your heart muscle. This procedure is referred to as a "Non-Invasive Stress Test." This is because other than having an IV started in your vein, nothing is inserted or "invades" your body. Cardiac stress tests are done to find areas of poor blood flow to the heart by determining the extent of coronary artery disease (CAD). Some patients exercise on a treadmill, which naturally increases the blood flow to your heart, while others who are  unable to walk on a treadmill due to physical limitations have a pharmacologic/chemical stress agent called Lexiscan . This medicine will mimic walking on a treadmill by temporarily increasing your coronary blood flow.   Please note: these test may take anywhere between 2-4 hours to complete  PLEASE REPORT TO [place] AT THE FIRST DESK WILL DIRECT YOU WHERE TO GO  Date of Procedure: Tuesday, August 23, 9:30am Arrival Time for Procedure: 9:00am  Instructions regarding medication:   __xx__ : Hold metformin medication 24 hours before and 48 hours after procedure _xx___:  Hold metoprolol night before procedure and morning of procedure    PLEASE NOTIFY THE OFFICE AT LEAST 24 HOURS IN ADVANCE IF YOU ARE UNABLE TO KEEP YOUR APPOINTMENT.  [phone] AND  PLEASE NOTIFY NUCLEAR MEDICINE AT [facility] AT LEAST 24 HOURS IN ADVANCE IF YOU ARE UNABLE TO KEEP YOUR APPOINTMENT. [phone]  How to prepare for your Myoview  test:   Do not eat or drink after midnight  No caffeine for 24 hours prior to test  No smoking 24 hours prior to test.  Your medication may be taken with water.  If your doctor stopped a medication because of this test, do not take that medication.  Ladies, please do not wear dresses.  Skirts or pants are appropriate. Please wear a short sleeve shirt.  No perfume, cologne or lotion.  Wear comfortable walking shoes. No heels!            Follow-Up: Your physician recommends that you schedule a follow-up appointment in: one month with Dr. [name].    Any Other Special Instructions Will Be Listed Below (If Applicable).  Nuclear Medicine Exam A nuclear medicine exam is a safe and painless imaging test. It helps to detect and diagnose disease in the body as well as provide information about organ function and structure.  Nuclear scans are most often done of the:  Lungs.  Heart.  Thyroid gland.  Bones.  Abdomen. HOW A NUCLEAR MEDICINE EXAM WORKS A nuclear medicine exam works by using a radioactive tracer. The material is given either by an IV (intravenous) injection or it may be swallowed. After the tracer is in the body, it is absorbed by your body's organs. A large scanning machine that uses a special camera detects the radioactivity in your body. A computerized image is then formed regarding the area of concern. The small amounts of radioactive material used in a nuclear medicine exam are found to be medically safe. However, because radioactive material is used, this test is not done if  you are pregnant or nursing.  BEFORE THE PROCEDURE  If available, bring previous imaging studies such as x-rays, etc. with you to the exam.  Arrive early for your exam. PROCEDURE  An IV may be started before the exam begins.  Depending on the type of examination, will lie on a table or sit in a chair during the exam.  The nuclear medicine exam will take about 30 to 60 minutes to  complete. AFTER THE PROCEDURE  After your scan is completed, the image(s) will be evaluated by a specialist. It is important that you follow up with your caregiver to find out your test results.  You may return to your regular activity as instructed by your caregiver. SEEK IMMEDIATE MEDICAL CARE IF: You have shortness of breath or difficulty breathing. MAKE SURE YOU:   Understand these instructions.  Will watch your condition.  Will get help right away if you are not doing well or get worse. Document Released: 02/24/2004 Document Revised: 04/10/2011 Document Reviewed: 04/09/2008 [facility] Patient Information 2015 [place], [place]. This information is not intended to replace advice given to you by your health care provider. Make sure you discuss any questions you have with your health care provider.

## 2014-09-14 NOTE — Assessment & Plan Note (Signed)
This was moderate on most recent echocardiogram.

## 2014-09-14 NOTE — Assessment & Plan Note (Signed)
We did a Holter monitor which showed 27,006 of PVCs in 24 hours which is very excessive. However, on today's EKG and by physical exam he has no PVCs and it's possible that increasing the dose of metoprolol has helped.  he does not consume excessive amount of caffeine. I advised him to stay well-hydrated during exercise.  Consider repeat Holter monitor in one month. If the burden is still high, he might require an anterior arrhythmic medication such as amiodarone given that the dose of metoprolol cannot be increased due to relatively low blood pressure.

## 2014-09-16 ENCOUNTER — Telehealth: Payer: Self-pay | Admitting: *Deleted

## 2014-09-16 NOTE — Telephone Encounter (Signed)
Notified Ebony Hail (pharmacy tech) with [facility] per Dr. Fletcher Anon the patient should be taking the Metoprolol Tart 25 mg one tablet twice a day.

## 2014-09-16 NOTE — Telephone Encounter (Signed)
He is supposed to be on Metoprolol Tartrate 25 mg bid.

## 2014-09-16 NOTE — Telephone Encounter (Signed)
Metoprolol  What we sent in, is different from what pt thinks it suppose to be.

## 2014-09-16 NOTE — Telephone Encounter (Signed)
[place] would like to know if the patient should be on Metoprolol Succ or Tartrate because the patient was thinking he would take Metoprolol Succ 25 mg twice a day but Dr. Anselm Jungling sent in Rx for Metoprolol Tart 25 mg one tablet twice a day on 09/08/2014. The patient thought at his appointment on 09/14/2014 with Dr. Fletcher Anon he would just increase the dose of Metoprolol Succ to 25 mg twice a day. Please advise which Metoprolol the patient should be on Succinate or Tartrate.

## 2014-09-16 NOTE — Telephone Encounter (Signed)
[facility] is calling asking about Metoprolol  They are asking about What we sent in, is different from what pt thinks it suppose to be.  So they would like to clarify it.

## 2014-09-22 ENCOUNTER — Encounter
Admission: RE | Admit: 2014-09-22 | Discharge: 2014-09-22 | Disposition: A | Discharge status: Home / Self Care | Payer: Medicare Other | Source: Ambulatory Visit | Attending: Cardiovascular Disease | Admitting: Cardiovascular Disease

## 2014-09-22 DIAGNOSIS — I493 Ventricular premature depolarization: Secondary | ICD-10-CM | POA: Diagnosis present

## 2014-09-22 DIAGNOSIS — R0602 Shortness of breath: Secondary | ICD-10-CM

## 2014-09-22 MED ORDER — REGADENOSON 0.4 MG/5ML IV SOLN
0.4000 mg | Freq: Once | INTRAVENOUS | Status: AC
  Administered 2014-09-22: 0.4 mg via INTRAVENOUS

## 2014-09-22 MED ORDER — TECHNETIUM TC 99M SESTAMIBI - CARDIOLITE
32.8900 | Freq: Once | INTRAVENOUS | Status: AC | PRN
  Administered 2014-09-22: 11:00:00 32.89 via INTRAVENOUS

## 2014-09-22 MED ORDER — TECHNETIUM TC 99M SESTAMIBI - CARDIOLITE
13.7000 | Freq: Once | INTRAVENOUS | Status: AC | PRN
  Administered 2014-09-22: 10:00:00 13.7 via INTRAVENOUS

## 2014-09-23 ENCOUNTER — Telehealth: Payer: Self-pay

## 2014-09-23 LAB — NM MYOCAR MULTI W/SPECT W/WALL MOTION / EF
Estimated workload: 1 METS
Exercise duration (min): 0 min
Exercise duration (sec): 0 s
LV dias vol: 76 mL
LV sys vol: 22 mL
MPHR: 140 {beats}/min
Peak HR: 93 {beats}/min
Percent HR: 67 %
Rest HR: 66 {beats}/min
SDS: 0
SRS: 1
SSS: 0
TID: 0.96

## 2014-09-23 NOTE — Telephone Encounter (Signed)
Pt wife called, would like stress test results. Please call. 

## 2014-09-24 NOTE — Telephone Encounter (Signed)
S/w pt regarding stress test results and confirmed Sept appt with Dr. [name]. See result note

## 2014-10-19 ENCOUNTER — Other Ambulatory Visit: Payer: Self-pay | Admitting: *Deleted

## 2014-10-19 ENCOUNTER — Telehealth: Payer: Self-pay | Admitting: *Deleted

## 2014-10-19 MED ORDER — METOPROLOL TARTRATE 25 MG PO TABS: 25.0000 mg | ORAL_TABLET | Freq: Two times a day (BID) | ORAL | Status: DC

## 2014-10-19 NOTE — Telephone Encounter (Signed)
Rx sent to local pharmacy.  

## 2014-10-19 NOTE — Telephone Encounter (Signed)
Pharmacy calling stating they have not received our refill. Pt is there waiting on this  Please send again.

## 2014-10-19 NOTE — Telephone Encounter (Signed)
Rx sent for Metoprolol 25 mg #90 R#3.

## 2014-10-19 NOTE — Telephone Encounter (Signed)
  1. Which medications need to be refilled?  Metoprolol  25mg   2. Which pharmacy is medication to be sent to? [facility]  3. Do they need a 30 day or 90 day supply?  90 day   4. Would they like a call back once the medication has been sent to the pharmacy? Yes

## 2014-10-21 ENCOUNTER — Encounter: Payer: Self-pay | Admitting: Cardiology

## 2014-10-21 ENCOUNTER — Ambulatory Visit (INDEPENDENT_AMBULATORY_CARE_PROVIDER_SITE_OTHER): Payer: Medicare Other | Admitting: Cardiology

## 2014-10-21 VITALS — BP 100/62 | HR 66 | Ht 69.0 in | Wt 169.0 lb

## 2014-10-21 DIAGNOSIS — Z9861 Coronary angioplasty status: Secondary | ICD-10-CM

## 2014-10-21 DIAGNOSIS — I2510 Atherosclerotic heart disease of native coronary artery without angina pectoris: Secondary | ICD-10-CM

## 2014-10-21 DIAGNOSIS — E785 Hyperlipidemia, unspecified: Secondary | ICD-10-CM | POA: Diagnosis not present

## 2014-10-21 DIAGNOSIS — I10 Essential (primary) hypertension: Secondary | ICD-10-CM

## 2014-10-21 DIAGNOSIS — I493 Ventricular premature depolarization: Secondary | ICD-10-CM | POA: Diagnosis not present

## 2014-10-21 NOTE — Patient Instructions (Signed)
Medication Instructions:  Your physician recommends that you continue on your current medications as directed. Please refer to the Current Medication list given to you today.   Labwork: none  Testing/Procedures: none  Follow-Up: Your physician wants you to follow-up in: six months with Dr. Harding.  You will receive a reminder letter in the mail two months in advance. If you don't receive a letter, please call our office to schedule the follow-up appointment.   Any Other Special Instructions Will Be Listed Below (If Applicable).   

## 2014-10-21 NOTE — Progress Notes (Signed)
PCP: [facility], MD  Clinic Note: Chief Complaint  Patient presents with  . other    1 month follow up as well as discuss the 24 hour holter monitor and stress test. Meds reviewed by the patient verbally.      HPI: Victor [name] is a 81 y.o. male with a PMH below who presents today for ~1 month f/u for CAD-PCI & Palpitations/Bradycardia.. He has known CAD s/p PCI to RCA in 2008. Echo at that time showed EF >55% with moderate calcification of the aortic valve leaflets. Follow up echo in 2012 showed mild to moderate aortic stenosis. He underwent cardiac cath in 2013 in the setting of positive EKG (inferior TWI) with negative nuclear stress test. No ischemic symptoms at that time. Cardiac cath showed widely patient RCA stent with mild progression of proximal RCA disease to moderate 50-60% lesion. There was progression of ostial D3 lesion to 60-70%, not optimal for PCI given the ostial nature and size of the vessel. EF 55-60% with no WMA. It was recommended he continue optimal medical therapy.  Patient exercises regularly 3 times weekly, usually without issues.   . I last saw Victor [name] in April 2016.  Recent Hospitalizations: 09/06/2014 for dizziness/ near syncope & concern for bradycardia.  He was admitted for symptomatic bradycardia.  He reported that when he checked his pulse manually, he counted a H in the 40s -- however, on Tele monitor (when he again counted HR in 40s) his rhythm proved to be NSR with PVCs in bigeminy pattern with a rate in the 80s -- hence the palpable HR in the 40s.   His BB dose was increased to 25 m bid & he was discharged with a holter monitor.    He was last seen on 09/13/2013 by Victor [name] for f/u after his [facility] hospitalization --> he noted that the diziness had improved, but he was noting some low BP readings. Despite this he had not yet gone back to his regular routine activities of exercise.  Plan was to monitor for change in Sx & consider rechecking a  monitor in ~1 month to reassess burden of PVCs.  He also ordered a Myoview ST.    Studies Reviewed:   Echo 09/07/2014: @ [facility] -- Victor [name] is in moderate concentric LVH. EF 55-60%. Normal wall motion.Gr 1 DD. Moderate aortic stenosis with trivial AI ( mean gradient 20 mm Hg, AoVA ~ 0.8 cm^2); moderate MR. Mild LA dilation. PA pressure ~ 40 mmHg  48 Hr Monitor: ~2700 PVCs  Myoview [facility]): LOCAL RISK, NORMAL STUDY, EF 4500-4%. No ischemia or infarction.    Interval History: Admiral presents today to fairly well. He is now starting to get back and do his workouts and an elliptical and stationary bicycle for about an hour and a half a day today's weight gain. He has not really noted much in the way of any of the dizziness or wooziness. He is not really checking his pulse is marked, has not noted that his heart rate is slower. He has not noted palpitations. From the CAD standpoint, he denies any chest pain or shortness of breath with rest or exertion.  No PND, orthopnea or edema.  Minimal  palpitations, with NO lightheadedness, dizziness, weakness or syncope/near syncope since his hospital visit. No TIA/amaurosis fugax symptoms. No melena, hematochezia, hematuria, or epstaxis. No claudication.   Past Medical History  Diagnosis Date  . CAD S/P percutaneous coronary angioplasty 07/2006    PCI  to RCA - Promus DES 2.5 mm x 23 mm; 2D ECHO - EF >55%, moderate calcification of the aortic valve leaflets  . Aortic valve stenosis, moderate 08/2010; 12/05/2012    Mild to moderate Aortic Stenosis;; Stable in 2014 --> EF 55-60%, Mod AoV Calcification with mild-moderate stenosis; mild MR. (Stable)  . Arthritis   . Diabetes mellitus   . High cholesterol   . Hypertension     "from the diabetes"  . Angina   . GERD (gastroesophageal reflux disease)   . BPH (benign prostatic hypertrophy)   . Bilateral carotid artery disease     CAROTID DOPPLER,05/21/2008 - Right and left ICA-0-49% diameter  reduction, left CCA-0-49% diameter reductiion    Past Surgical History  Procedure Laterality Date  . Transurethral resection of prostate  ~ 2010  . Colectomy  ~ 2000  . Cardiac catheterization  03/21/2011    Continue to optimize medical therapy  . Cardiac catheterization with pci  08/27/2006    RCA-mid - 2.5x91mm Promus stent  . Nm myoview ltd  03/09/2011    Post-stress EF 76%, ECG positive for ischemia, normal study  . Left heart catheterization with coronary angiogram N/A 03/22/2011    Procedure: LEFT HEART CATHETERIZATION WITH CORONARY ANGIOGRAM;  Surgeon: Victor Man, MD;  Location: [facility] CATH LAB;  Service: Cardiovascular;  Laterality: N/A;    ROS: A comprehensive was performed.  If not mentioned in history of present illness: Review of Systems  Constitutional: Negative for malaise/fatigue.  HENT: Negative for nosebleeds.   Cardiovascular: Negative for claudication.  Musculoskeletal: Negative for joint pain.  Endo/Heme/Allergies: Does not bruise/bleed easily.  All other systems reviewed and are negative.   Prior to Admission medications   Medication Sig Start Date End Date Taking? Authorizing Provider  aspirin EC 81 MG tablet Take 81 mg by mouth daily.   Yes Historical Provider, MD  calcium carbonate (OS-CAL) 600 MG TABS tablet Take 600 mg by mouth daily.   Yes Historical Provider, MD  cetirizine (ZYRTEC) 10 MG tablet Take 10 mg by mouth daily.   Yes Historical Provider, MD  Cholecalciferol (VITAMIN D3) 2000 UNITS TABS Take 1 tablet by mouth daily.   Yes Historical Provider, MD  clopidogrel (PLAVIX) 75 MG tablet Take 75 mg by mouth daily.   Yes Historical Provider, MD  Coenzyme Q10 (CO Q 10 PO) Take by mouth daily.   Yes Historical Provider, MD  donepezil (ARICEPT) 10 MG tablet Take 10 mg by mouth daily. 11/11/12  Yes Historical Provider, MD  EPIPEN 2-PAK 0.3 MG/0.3ML SOAJ injection Inject 0.3 mLs as directed as directed. 06/01/14  Yes Historical Provider, MD    ezetimibe-simvastatin (VYTORIN) 10-40 MG per tablet Take 1 tablet by mouth at bedtime.   Yes Historical Provider, MD  fexofenadine (ALLEGRA) 180 MG tablet Take 180 mg by mouth daily.   Yes Historical Provider, MD  fluticasone (FLONASE) 50 MCG/ACT nasal spray Place 2 sprays into both nostrils daily.   Yes Historical Provider, MD  gabapentin (NEURONTIN) 100 MG capsule Take 200 mg by mouth at bedtime.   Yes Historical Provider, MD  ipratropium (ATROVENT) 0.03 % nasal spray Place 2 sprays into both nostrils 3 (three) times daily as needed for rhinitis.   Yes Historical Provider, MD  lansoprazole (PREVACID) 30 MG capsule Take 30 mg by mouth daily. 11/04/12  Yes Historical Provider, MD  Magnesium Sulfate 70 MG CAPS Take 70 mg by mouth daily.   Yes Historical Provider, MD  metoprolol tartrate (LOPRESSOR) 25 MG tablet  Take 1 tablet (25 mg total) by mouth 2 (two) times daily. 10/19/14  Yes Minna Merritts, MD  Misc Natural Products (OSTEO BI-FLEX ADV JOINT SHIELD PO) Take 1 tablet by mouth daily.   Yes Historical Provider, MD  Multiple Vitamin (MULITIVITAMIN WITH MINERALS) TABS Take 1 tablet by mouth daily.   Yes Historical Provider, MD  Olopatadine HCl 0.2 % SOLN Apply 1 drop to eye daily as needed. 11/11/13  Yes Historical Provider, MD  Omega 3 1200 MG CAPS Take 1,200 mg by mouth 2 (two) times daily.   Yes Historical Provider, MD  oxybutynin (DITROPAN XL) 15 MG 24 hr tablet Take 15 mg by mouth daily.   Yes Historical Provider, MD  Saw Palmetto 450 MG CAPS Take 2 capsules by mouth daily.   Yes Historical Provider, MD  sitaGLIPtin (JANUVIA) 100 MG tablet Take by mouth. 10/20/14 10/20/15 Yes Historical Provider, MD  Tamsulosin HCl (FLOMAX) 0.4 MG CAPS Take 0.4 mg by mouth daily. Take 30 minutes after same meal each day.   Yes Historical Provider, MD  vitamin B-12 (CYANOCOBALAMIN) 1000 MCG tablet Take 1,000 mcg by mouth daily.   Yes Historical Provider, MD   Allergies  Allergen Reactions  . Oraxyl  [Doxycycline Hyclate] Swelling    "started swelling in my mouth & tongue; had to go to emergency room; really bad reaction"  . Azithromycin Rash  . Cephalosporins Other (See Comments)    unknown  . Pseudoephedrine Other (See Comments)    unknown  . Amoxicillin Rash  . Codeine Itching and Rash  . Levofloxacin Rash  . Sulfa Antibiotics Rash     Social History   Social History  . Marital Status: Married    Spouse Name: N/A  . Number of Children: N/A  . Years of Education: N/A   Social History Main Topics  . Smoking status: Never Smoker   . Smokeless tobacco: Former Systems developer    Types: Chew    Quit date: 01/31/1996  . Alcohol Use: 3.6 oz/week    6 Glasses of wine per week  . Drug Use: No  . Sexual Activity: Yes   Other Topics Concern  . None   Social History Narrative   Father of 2, grandfather 73.   Exercise for almost 2 hours a day, doing least 20 minutes on the elliptical trainer. He does his to 4 days a week. He'll also does weights and stretching exercises.   Family History  Problem Relation Age of Onset  . Family history unknown: Yes    Wt Readings from Last 3 Encounters:  10/21/14 169 lb (76.658 kg)  09/14/14 169 lb 4 oz (76.771 kg)  09/06/14 167 lb 15.9 oz (76.2 kg)    PHYSICAL EXAM BP 100/62 mmHg  Pulse 66  Ht 5\' 9"  (1.753 m)  Wt 169 lb (76.658 kg)  BMI 24.95 kg/m2 General appearance: alert, cooperative, appears stated age, no distress and Pleasant mood and affect Neck: no carotid bruit and no JVD HEENT: [place]/AT, EOMI, MMM, anicteric sclera Lungs: clear to auscultation bilaterally, normal percussion bilaterally and Nonlabored, normal excursion. Heart: regular rate and rhythm, S1& S2 normal, no S3 or S4, systolic murmur: systolic ejection 2/6, crescendo, decrescendo, harsh and Mid-peaking mid peaking at 2nd right intercostal space, radiates to carotids and no rub Abdomen: soft, non-tender; bowel sounds normal; no masses, no organomegaly Extremities:  extremities normal, atraumatic, no cyanosis or edema Pulses: 2+ and symmetric Neurologic: Grossly normal;   Adult ECG Report - not checked  Other studies Reviewed: Additional studies/ records that were reviewed today include:  Notes Recorded by Victor Man, MD on 07/14/2014 at 3:02 PM Recent Labs: 07/01/2014 Na+ 138, K+ 4.7, Cl- 102, HCO3- 30.8 , BUN 15, Cr 1, Glu 118, Ca2+ 9.3; AST 23, ALT 22 AlkP 51, Alb 4.5, TP 6.8, T Bili 0.5 TC 118, TG 82, HDL 46, LDL 56 -- goal  All labs look good. Lipids are @ goal.    ASSESSMENT / PLAN: Problem List Items Addressed This Visit    None      Current medicines are reviewed at length with the patient today. (+/- concerns) His PCP recently stopped Metformin due to side effects.  The following changes have been made: None Studies Ordered:   No orders of the defined types were placed in this encounter.    ROV: 6 months    HARDING, Victor Green, M.D., M.S. Interventional Cardiologist   Pager # [phone]

## 2014-10-23 ENCOUNTER — Encounter: Payer: Self-pay | Admitting: Cardiology

## 2014-10-23 NOTE — Assessment & Plan Note (Signed)
On Vytorin. No myalgias or side effects. Excellent control by labs checked this summer.

## 2014-10-23 NOTE — Assessment & Plan Note (Signed)
He continues to be very active with exercise and has not had any anginal symptoms in quite some time.  He just had a negative Myoview. On aspirin plus Plavix as well as& beta blocker.

## 2014-10-23 NOTE — Assessment & Plan Note (Signed)
As a beta blocker. I don't think that any symptoms, we need to recheck a monitor. His blood pressure is I. Therefore elected to do any further titration of beta blocker.    As you and I will not pursue a antiarrhythmic therapy. Evaluated with a Myoview showed no evidence of ischemia.

## 2014-10-23 NOTE — Assessment & Plan Note (Signed)
Borderline hypotensive with increased dose of beta blocker. Monitor for hypotension.

## 2015-03-03 NOTE — H&P (Signed)
See scanned note.

## 2015-03-04 MED ORDER — POLYMYXIN B-TRIMETHOPRIM 10000-0.1 UNIT/ML-% OP SOLN: 1.0000 [drp] | OPHTHALMIC | Status: DC

## 2015-03-07 NOTE — H&P (Signed)
See scanned note.

## 2015-03-08 ENCOUNTER — Ambulatory Visit: Payer: Medicare Other | Admitting: Anesthesiology

## 2015-03-08 ENCOUNTER — Encounter: Payer: Self-pay | Admitting: *Deleted

## 2015-03-08 ENCOUNTER — Ambulatory Visit
Admission: RE | Admit: 2015-03-08 | Discharge: 2015-03-08 | Disposition: A | Discharge status: Home / Self Care | Payer: Medicare Other | Source: Ambulatory Visit | Attending: Ophthalmology | Admitting: Ophthalmology

## 2015-03-08 ENCOUNTER — Encounter
Admission: RE | Disposition: A | Discharge status: Home / Self Care | Payer: Self-pay | Source: Ambulatory Visit | Attending: Ophthalmology

## 2015-03-08 DIAGNOSIS — K219 Gastro-esophageal reflux disease without esophagitis: Secondary | ICD-10-CM | POA: Insufficient documentation

## 2015-03-08 DIAGNOSIS — Z85828 Personal history of other malignant neoplasm of skin: Secondary | ICD-10-CM | POA: Insufficient documentation

## 2015-03-08 DIAGNOSIS — M1990 Unspecified osteoarthritis, unspecified site: Secondary | ICD-10-CM | POA: Insufficient documentation

## 2015-03-08 DIAGNOSIS — I2510 Atherosclerotic heart disease of native coronary artery without angina pectoris: Secondary | ICD-10-CM | POA: Diagnosis not present

## 2015-03-08 DIAGNOSIS — Z885 Allergy status to narcotic agent status: Secondary | ICD-10-CM | POA: Diagnosis not present

## 2015-03-08 DIAGNOSIS — R42 Dizziness and giddiness: Secondary | ICD-10-CM | POA: Diagnosis not present

## 2015-03-08 DIAGNOSIS — Z888 Allergy status to other drugs, medicaments and biological substances status: Secondary | ICD-10-CM | POA: Diagnosis not present

## 2015-03-08 DIAGNOSIS — K5790 Diverticulosis of intestine, part unspecified, without perforation or abscess without bleeding: Secondary | ICD-10-CM | POA: Insufficient documentation

## 2015-03-08 DIAGNOSIS — E7800 Pure hypercholesterolemia, unspecified: Secondary | ICD-10-CM | POA: Insufficient documentation

## 2015-03-08 DIAGNOSIS — Z882 Allergy status to sulfonamides status: Secondary | ICD-10-CM | POA: Insufficient documentation

## 2015-03-08 DIAGNOSIS — E119 Type 2 diabetes mellitus without complications: Secondary | ICD-10-CM | POA: Insufficient documentation

## 2015-03-08 DIAGNOSIS — H9190 Unspecified hearing loss, unspecified ear: Secondary | ICD-10-CM | POA: Diagnosis not present

## 2015-03-08 DIAGNOSIS — Z881 Allergy status to other antibiotic agents status: Secondary | ICD-10-CM | POA: Diagnosis not present

## 2015-03-08 DIAGNOSIS — Z9842 Cataract extraction status, left eye: Secondary | ICD-10-CM | POA: Insufficient documentation

## 2015-03-08 DIAGNOSIS — H2511 Age-related nuclear cataract, right eye: Secondary | ICD-10-CM | POA: Insufficient documentation

## 2015-03-08 DIAGNOSIS — Z955 Presence of coronary angioplasty implant and graft: Secondary | ICD-10-CM | POA: Diagnosis not present

## 2015-03-08 DIAGNOSIS — R011 Cardiac murmur, unspecified: Secondary | ICD-10-CM | POA: Insufficient documentation

## 2015-03-08 HISTORY — PX: CATARACT EXTRACTION W/PHACO: SHX586

## 2015-03-08 LAB — GLUCOSE, CAPILLARY: Glucose-Capillary: 111 mg/dL — ABNORMAL HIGH (ref 65–99)

## 2015-03-08 SURGERY — PHACOEMULSIFICATION, CATARACT, WITH IOL INSERTION
Anesthesia: Monitor Anesthesia Care | Site: Eye | Laterality: Right | Wound class: Clean

## 2015-03-08 MED ORDER — CYCLOPENTOLATE HCL 2 % OP SOLN: 1.0000 [drp] | OPHTHALMIC | Status: AC

## 2015-03-08 MED ORDER — EPINEPHRINE HCL 1 MG/ML IJ SOLN
INTRAOCULAR | Status: DC | PRN
  Administered 2015-03-08: 250 mL via OPHTHALMIC

## 2015-03-08 MED ORDER — POLYMYXIN B-TRIMETHOPRIM 10000-0.1 UNIT/ML-% OP SOLN: 1.0000 [drp] | OPHTHALMIC | Status: AC

## 2015-03-08 MED ORDER — LIDOCAINE HCL (PF) 4 % IJ SOLN
INTRAOCULAR | Status: DC | PRN
  Administered 2015-03-08: 4 mL via OPHTHALMIC

## 2015-03-08 MED ORDER — METOPROLOL TARTRATE 25 MG PO TABS
ORAL_TABLET | ORAL | Status: AC
  Administered 2015-03-08: 25 mg via ORAL
  Filled 2015-03-08: qty 1

## 2015-03-08 MED ORDER — PHENYLEPHRINE HCL 10 % OP SOLN
OPHTHALMIC | Status: AC
  Administered 2015-03-08: 07:00:00
  Filled 2015-03-08: qty 5

## 2015-03-08 MED ORDER — CEFUROXIME OPHTHALMIC INJECTION 1 MG/0.1 ML
INJECTION | OPHTHALMIC | Status: AC
  Filled 2015-03-08: qty 0.1

## 2015-03-08 MED ORDER — POLYMYXIN B-TRIMETHOPRIM 10000-0.1 UNIT/ML-% OP SOLN
OPHTHALMIC | Status: AC
  Filled 2015-03-08: qty 10

## 2015-03-08 MED ORDER — PHENYLEPHRINE HCL 2.5 % OP SOLN
1.0000 [drp] | OPHTHALMIC | Status: AC
  Filled 2015-03-08: qty 2

## 2015-03-08 MED ORDER — MIDAZOLAM HCL 2 MG/2ML IJ SOLN
INTRAMUSCULAR | Status: DC | PRN
  Administered 2015-03-08: 0.5 mg via INTRAVENOUS

## 2015-03-08 MED ORDER — LIDOCAINE HCL (PF) 4 % IJ SOLN
INTRAMUSCULAR | Status: DC | PRN
  Administered 2015-03-08: 4 mL via OPHTHALMIC

## 2015-03-08 MED ORDER — MOXIFLOXACIN HCL 0.5 % OP SOLN
OPHTHALMIC | Status: AC
  Filled 2015-03-08: qty 3

## 2015-03-08 MED ORDER — CARBACHOL 0.01 % IO SOLN
INTRAOCULAR | Status: DC | PRN
  Administered 2015-03-08: 0.5 mL via INTRAOCULAR

## 2015-03-08 MED ORDER — EPINEPHRINE HCL 1 MG/ML IJ SOLN
INTRAMUSCULAR | Status: AC
Start: 2015-03-08 — End: 2015-03-08
  Filled 2015-03-08: qty 1

## 2015-03-08 MED ORDER — SODIUM CHLORIDE 0.9 % IV SOLN
INTRAVENOUS | Status: DC
  Administered 2015-03-08 (×2): via INTRAVENOUS

## 2015-03-08 MED ORDER — NA CHONDROIT SULF-NA HYALURON 40-17 MG/ML IO SOLN
INTRAOCULAR | Status: DC | PRN
  Administered 2015-03-08: 1 mL via INTRAOCULAR

## 2015-03-08 MED ORDER — LIDOCAINE HCL (PF) 4 % IJ SOLN
INTRAMUSCULAR | Status: AC
  Filled 2015-03-08: qty 5

## 2015-03-08 MED ORDER — METOPROLOL TARTRATE 25 MG PO TABS
25.0000 mg | ORAL_TABLET | Freq: Once | ORAL | Status: AC
  Administered 2015-03-08: 25 mg via ORAL

## 2015-03-08 MED ORDER — TETRACAINE HCL 0.5 % OP SOLN
OPHTHALMIC | Status: DC | PRN
  Administered 2015-03-08: 2 [drp] via OPHTHALMIC

## 2015-03-08 MED ORDER — MOXIFLOXACIN HCL 0.5 % OP SOLN
OPHTHALMIC | Status: DC | PRN
  Administered 2015-03-08: 4 [drp] via OPHTHALMIC

## 2015-03-08 MED ORDER — TETRACAINE HCL 0.5 % OP SOLN
OPHTHALMIC | Status: AC
  Filled 2015-03-08: qty 2

## 2015-03-08 MED ORDER — ALFENTANIL 500 MCG/ML IJ INJ
INJECTION | INTRAMUSCULAR | Status: DC | PRN
  Administered 2015-03-08: 500 ug via INTRAVENOUS

## 2015-03-08 MED ORDER — BUPIVACAINE HCL (PF) 0.75 % IJ SOLN
INTRAMUSCULAR | Status: AC
  Filled 2015-03-08: qty 10

## 2015-03-08 MED ORDER — CYCLOPENTOLATE HCL 2 % OP SOLN
OPHTHALMIC | Status: AC
  Filled 2015-03-08: qty 2

## 2015-03-08 MED ORDER — ALFENTANIL 500 MCG/ML IJ INJ: INJECTION | INTRAMUSCULAR | Status: DC | PRN

## 2015-03-08 MED ORDER — HYALURONIDASE HUMAN 150 UNIT/ML IJ SOLN
INTRAMUSCULAR | Status: AC
  Filled 2015-03-08: qty 1

## 2015-03-08 MED ORDER — NA CHONDROIT SULF-NA HYALURON 40-17 MG/ML IO SOLN
INTRAOCULAR | Status: AC
  Filled 2015-03-08: qty 1

## 2015-03-08 SURGICAL SUPPLY — 29 items
CANNULA ANT/CHMB 27GA (MISCELLANEOUS) ×2 IMPLANT
CORD BIP STRL DISP 12FT (MISCELLANEOUS) ×2 IMPLANT
CUP MEDICINE 2OZ PLAST GRAD ST (MISCELLANEOUS) ×2 IMPLANT
DRAPE XRAY CASSETTE 23X24 (DRAPES) ×2 IMPLANT
ERASER HMR WETFIELD 18G (MISCELLANEOUS) ×2 IMPLANT
GLOVE BIO SURGEON STRL SZ8 (GLOVE) ×2 IMPLANT
GLOVE SURG LX 6.5 MICRO (GLOVE) ×1
GLOVE SURG LX 8.0 MICRO (GLOVE) ×1
GLOVE SURG LX STRL 6.5 MICRO (GLOVE) ×1 IMPLANT
GLOVE SURG LX STRL 8.0 MICRO (GLOVE) ×1 IMPLANT
GOWN STRL REUS W/ TWL LRG LVL3 (GOWN DISPOSABLE) ×1 IMPLANT
GOWN STRL REUS W/ TWL XL LVL3 (GOWN DISPOSABLE) ×1 IMPLANT
GOWN STRL REUS W/TWL LRG LVL3 (GOWN DISPOSABLE) ×1
GOWN STRL REUS W/TWL XL LVL3 (GOWN DISPOSABLE) ×1
LENS IOL ACRYSOF IQ 19.5 (Intraocular Lens) ×2 IMPLANT
PACK CATARACT (MISCELLANEOUS) ×2 IMPLANT
PACK CATARACT DINGLEDEIN LX (MISCELLANEOUS) ×2 IMPLANT
PACK EYE AFTER SURG (MISCELLANEOUS) ×2 IMPLANT
SHLD EYE VISITEC  UNIV (MISCELLANEOUS) ×2 IMPLANT
SOL BSS BAG (MISCELLANEOUS) ×2
SOL PREP PVP 2OZ (MISCELLANEOUS) ×2
SOLUTION BSS BAG (MISCELLANEOUS) ×1 IMPLANT
SOLUTION PREP PVP 2OZ (MISCELLANEOUS) ×1 IMPLANT
SUT SILK 5-0 (SUTURE) ×2 IMPLANT
SYR 3ML LL SCALE MARK (SYRINGE) ×2 IMPLANT
SYR 5ML LL (SYRINGE) ×2 IMPLANT
SYR TB 1ML 27GX1/2 LL (SYRINGE) ×2 IMPLANT
WATER STERILE IRR 1000ML POUR (IV SOLUTION) ×2 IMPLANT
WIPE NON LINTING 3.25X3.25 (MISCELLANEOUS) ×2 IMPLANT

## 2015-03-08 NOTE — Anesthesia Preprocedure Evaluation (Signed)
Anesthesia Evaluation  Patient identified by MRN, date of birth, ID band Patient awake    Reviewed: Allergy & Precautions, NPO status , Patient's Chart, lab work & pertinent test results, reviewed documented beta blocker date and time   Airway Mallampati: II  TM Distance: >3 FB     Dental  (+) Chipped   Pulmonary           Cardiovascular hypertension, Pt. on medications and Pt. on home beta blockers + angina + CAD and + Peripheral Vascular Disease       Neuro/Psych    GI/Hepatic GERD  ,  Endo/Other  diabetes, Type 2  Renal/GU      Musculoskeletal  (+) Arthritis ,   Abdominal   Peds  Hematology   Anesthesia Other Findings On plavix. Cardiac stent.  Reproductive/Obstetrics                             Anesthesia Physical Anesthesia Plan  ASA: III  Anesthesia Plan: MAC   Post-op Pain Management:    Induction:   Airway Management Planned:   Additional Equipment:   Intra-op Plan:   Post-operative Plan:   Informed Consent: I have reviewed the patients History and Physical, chart, labs and discussed the procedure including the risks, benefits and alternatives for the proposed anesthesia with the patient or authorized representative who has indicated his/her understanding and acceptance.     Plan Discussed with: CRNA  Anesthesia Plan Comments:         Anesthesia Quick Evaluation

## 2015-03-08 NOTE — Transfer of Care (Signed)
Immediate Anesthesia Transfer of Care Note  Patient: Victor [name]  Procedure(s) Performed: Procedure(s) with comments: CATARACT EXTRACTION PHACO AND INTRAOCULAR LENS PLACEMENT (IOC) (Right) - [place]: 01:29.4 AP%: 25.5 CDE:40.01 Lot # [id number]   Patient Location: PACU  Anesthesia Type:MAC  Level of Consciousness: awake  Airway & Oxygen Therapy: Patient Spontanous Breathing and Patient connected to nasal cannula oxygen  Post-op Assessment: Report given to RN and Post -op Vital signs reviewed and stable  Post vital signs: Reviewed and stable  Last Vitals:  Filed Vitals:   03/08/15 0625  BP: 138/84  Pulse: 67  Temp: 36.7 C  Resp: 16    Complications: No apparent anesthesia complications

## 2015-03-08 NOTE — Op Note (Signed)
Date of Surgery: 03/08/2015 Date of Dictation: 03/08/2015 8:20 AM Pre-operative Diagnosis:  Nuclear Sclerotic Cataract right Eye Post-operative Diagnosis: same Procedure performed: Extra-capsular Cataract Extraction (ECCE) with placement of a posterior chamber intraocular lens (IOL) right Eye IOL:  Implant Name Type Inv. Item Serial No. Manufacturer Lot No. LRB No. Used  LENS IOL ACRYSOF IQ 19.5 - [id number] Intraocular Lens LENS IOL ACRYSOF IQ 19.5 [id number] ALCON [id number] Right 1   Anesthesia: 2% Lidocaine and 4% Marcaine in a 50/50 mixture with 10 unites/ml of Hylenex given as a peribulbar Anesthesiologist: Anesthesiologist: Gunnar Bulla, MD CRNA: Allean Found, CRNA Complications: none Estimated Blood Loss: less than 1 ml  Description of procedure:  The patient was given anesthesia and sedation via intravenous access. The patient was then prepped and draped in the usual fashion. A 25-gauge needle was bent for initiating the capsulorhexis. A 5-0 silk suture was placed through the conjunctiva superior and inferiorly to serve as bridle sutures. Hemostasis was obtained at the superior limbus using an eraser cautery. A partial thickness groove was made at the anterior surgical limbus with a 64 Beaver blade and this was dissected anteriorly with an Avaya. The anterior chamber was entered at 10 o'clock with a 1.0 mm paracentesis knife and through the lamellar dissection with a 2.6 mm Alcon keratome. Epi-Shugarcaine 0.5 CC [9 cc BSS Plus (Alcon), 3 cc 4% preservative-free lidocaine (Hospira) and 4 cc 1:1000 preservative-free, bisulfite-free epinephrine] was injected into the anterior chamber via the paracentesis tract. Epi-Shugarcaine 0.5 CC [9 cc BSS Plus (Alcon), 3 cc 4% preservative-free lidocaine (Hospira) and 4 cc 1:1000 preservative-free, bisulfite-free epinephrine] was injected into the anterior chamber via the paracentesis tract. DiscoVisc was injected to replace the aqueous  and a continuous tear curvilinear capsulorhexis was performed using a bent 25-gauge needle.  Balance salt on a syringe was used to perform hydro-dissection and phacoemulsification was carried out using a divide and conquer technique. Procedure(s) with comments: CATARACT EXTRACTION PHACO AND INTRAOCULAR LENS PLACEMENT (IOC) (Right) - [place]: 01:29.4 AP%: 25.5 CDE:40.01 Lot # [id number] . Irrigation/aspiration was used to remove the residual cortex and the capsular bag was inflated with DiscoVisc. The intraocular lens was inserted into the capsular bag using a pre-loaded UltraSert Delivery System. Irrigation/aspiration was used to remove the residual DiscoVisc. The wound was inflated with balanced salt and checked for leaks. None were found. Miostat was injected via the paracentesis track and 0.1 ml of Vigamox containing 1 mg of drug  was injected via the paracentesis track. The wound was checked for leaks again and none were found.   The bridal sutures were removed and two drops of Vigamox were placed on the eye. An eye shield was placed to protect the eye and the patient was discharged to the recovery area in good condition.   Vong Garringer MD

## 2015-03-08 NOTE — Anesthesia Procedure Notes (Signed)
Procedure Name: MAC Date/Time: 03/08/2015 7:39 AM Performed by: Allean Found Pre-anesthesia Checklist: Patient identified, Emergency Drugs available, Suction available, Patient being monitored and Timeout performed Patient Re-evaluated:Patient Re-evaluated prior to inductionOxygen Delivery Method: Nasal cannula Placement Confirmation: positive ETCO2 Comments: Spontaneous resp throughtout

## 2015-03-08 NOTE — OR Nursing (Signed)
Iv in left  Hand  With intact dressing infusing NS

## 2015-03-08 NOTE — Interval H&P Note (Signed)
History and Physical Interval Note:  03/08/2015 7:28 AM  Victor [name]  has presented today for surgery, with the diagnosis of CATARACT  The various methods of treatment have been discussed with the patient and family. After consideration of risks, benefits and other options for treatment, the patient has consented to  Procedure(s): CATARACT EXTRACTION PHACO AND INTRAOCULAR LENS PLACEMENT ([place]) (Right) as a surgical intervention .  The patient's history has been reviewed, patient examined, no change in status, stable for surgery.  I have reviewed the patient's chart and labs.  Questions were answered to the patient's satisfaction.     Junie Engram

## 2015-03-08 NOTE — Discharge Instructions (Signed)
Eye Surgery Discharge Instructions  Expect mild scratchy sensation or mild soreness. DO NOT RUB YOUR EYE!  The day of surgery:  Minimal physical activity, but bed rest is not required  No reading, computer work, or close hand work  No bending, lifting, or straining.  May watch TV  For 24 hours:  No driving, legal decisions, or alcoholic beverages  Safety precautions  Eat anything you prefer: It is better to start with liquids, then soup then solid foods.  _____ Eye patch should be worn until postoperative exam tomorrow.  ____ Solar shield eyeglasses should be worn for comfort in the sunlight/patch while sleeping  Resume all regular medications including aspirin or Coumadin if these were discontinued prior to surgery. You may shower, bathe, shave, or wash your hair. Tylenol may be taken for mild discomfort.  Call your doctor if you experience significant pain, nausea, or vomiting, fever > 101 or other signs of infection. [phone] or [phone] Specific instructions:  Follow-up Information    Follow up with Estill Cotta, MD On 03/09/2015.   Specialty:  Ophthalmology   Why:  10:35   Contact information:   [address]   [place] [address] [phone]

## 2015-03-08 NOTE — OR Nursing (Signed)
Iv dc'd prior to Dc home

## 2015-03-08 NOTE — Anesthesia Postprocedure Evaluation (Signed)
Anesthesia Post Note  Patient: Victor [name]  Procedure(s) Performed: Procedure(s) (LRB): CATARACT EXTRACTION PHACO AND INTRAOCULAR LENS PLACEMENT (IOC) (Right)  Patient location during evaluation: Short Stay Anesthesia Type: MAC Level of consciousness: awake Pain management: pain level controlled Vital Signs Assessment: post-procedure vital signs reviewed and stable Respiratory status: spontaneous breathing Cardiovascular status: blood pressure returned to baseline Postop Assessment: no headache Anesthetic complications: no    Last Vitals:  Filed Vitals:   03/08/15 0625  BP: 138/84  Pulse: 67  Temp: 36.7 C  Resp: 16    Last Pain: There were no vitals filed for this visit.               Buckner Malta

## 2015-03-16 DIAGNOSIS — F0390 Unspecified dementia without behavioral disturbance: Secondary | ICD-10-CM | POA: Insufficient documentation

## 2015-03-16 DIAGNOSIS — F03A0 Unspecified dementia, mild, without behavioral disturbance, psychotic disturbance, mood disturbance, and anxiety: Secondary | ICD-10-CM | POA: Insufficient documentation

## 2015-03-31 DEATH — deceased

## 2015-04-28 ENCOUNTER — Encounter: Payer: Self-pay | Admitting: Cardiology

## 2015-04-28 ENCOUNTER — Ambulatory Visit (INDEPENDENT_AMBULATORY_CARE_PROVIDER_SITE_OTHER): Payer: Medicare Other | Admitting: Cardiology

## 2015-04-28 VITALS — BP 110/64 | HR 67 | Ht 69.0 in | Wt 172.5 lb

## 2015-04-28 DIAGNOSIS — I350 Nonrheumatic aortic (valve) stenosis: Secondary | ICD-10-CM | POA: Diagnosis not present

## 2015-04-28 DIAGNOSIS — R001 Bradycardia, unspecified: Secondary | ICD-10-CM

## 2015-04-28 DIAGNOSIS — Z9861 Coronary angioplasty status: Secondary | ICD-10-CM | POA: Diagnosis not present

## 2015-04-28 DIAGNOSIS — R29818 Other symptoms and signs involving the nervous system: Secondary | ICD-10-CM

## 2015-04-28 DIAGNOSIS — I10 Essential (primary) hypertension: Secondary | ICD-10-CM

## 2015-04-28 DIAGNOSIS — R2689 Other abnormalities of gait and mobility: Secondary | ICD-10-CM

## 2015-04-28 DIAGNOSIS — I2510 Atherosclerotic heart disease of native coronary artery without angina pectoris: Secondary | ICD-10-CM

## 2015-04-28 DIAGNOSIS — E785 Hyperlipidemia, unspecified: Secondary | ICD-10-CM

## 2015-04-28 MED ORDER — METOPROLOL TARTRATE 25 MG PO TABS: 25.0000 mg | ORAL_TABLET | Freq: Two times a day (BID) | ORAL | Status: DC

## 2015-04-28 NOTE — Patient Instructions (Signed)
Medication Instructions:  Your physician has recommended you make the following change in your medication:  DECREASE morning dose of metoprolol to 1/2 tablet (12.5mg ). Continue metoprolol 25mg  in the evening   Labwork: none  Testing/Procedures: Your physician has requested that you have an echocardiogram before your six month follow up appointment. Echocardiography is a painless test that uses sound waves to create images of your heart. It provides your doctor with information about the size and shape of your heart and how well your heart's chambers and valves are working. This procedure takes approximately one hour. There are no restrictions for this procedure.    Follow-Up: Your physician wants you to follow-up in: six months with Dr. [name].  You will receive a reminder letter in the mail two months in advance. If you don't receive a letter, please call our office to schedule the follow-up appointment.   Any Other Special Instructions Will Be Listed Below (If Applicable).     If you need a refill on your cardiac medications before your next appointment, please call your pharmacy.  Echocardiogram An echocardiogram, or echocardiography, uses sound waves (ultrasound) to produce an image of your heart. The echocardiogram is simple, painless, obtained within a short period of time, and offers valuable information to your health care provider. The images from an echocardiogram can provide information such as:  Evidence of coronary artery disease (CAD).  Heart size.  Heart muscle function.  Heart valve function.  Aneurysm detection.  Evidence of a past heart attack.  Fluid buildup around the heart.  Heart muscle thickening.  Assess heart valve function. LET [facility] CARE PROVIDER KNOW ABOUT:  Any allergies you have.  All medicines you are taking, including vitamins, herbs, eye drops, creams, and over-the-counter medicines.  Previous problems you or members of your  family have had with the use of anesthetics.  Any blood disorders you have.  Previous surgeries you have had.  Medical conditions you have.  Possibility of pregnancy, if this applies. BEFORE THE PROCEDURE  No special preparation is needed. Eat and drink normally.  PROCEDURE   In order to produce an image of your heart, gel will be applied to your chest and a wand-like tool (transducer) will be moved over your chest. The gel will help transmit the sound waves from the transducer. The sound waves will harmlessly bounce off your heart to allow the heart images to be captured in real-time motion. These images will then be recorded.  You may need an IV to receive a medicine that improves the quality of the pictures. AFTER THE PROCEDURE You may return to your normal schedule including diet, activities, and medicines, unless your health care provider tells you otherwise.   This information is not intended to replace advice given to you by your health care provider. Make sure you discuss any questions you have with your health care provider.   Document Released: 01/14/2000 Document Revised: 02/06/2014 Document Reviewed: 09/23/2012 Elsevier Interactive Patient Education [employer].

## 2015-04-28 NOTE — Progress Notes (Signed)
PCP: Sofie Hartigan, MD  Clinic Note: Chief Complaint  Patient presents with  . Follow-up    6 month f/u no complaints today. Meds reviewed verbally with pt.    HPI: Victor [name] is a 82 y.o. male with a PMH below who presents today for ~1 month f/u for CAD-PCI & Palpitations/Bradycardia..  He has known CAD s/p PCI to RCA in 2008. Echo at that time showed EF >55% with moderate calcification of the aortic valve leaflets.   Follow up echo in 2012 showed mild to moderate aortic stenosis.   He underwent cardiac cath in 2013 in the setting of positive EKG (inferior TWI) with negative nuclear stress test. No ischemic symptoms at that time. Cardiac cath showed widely patient RCA stent with mild progression of proximal RCA disease to moderate 50-60% lesion. There was progression of ostial D3 lesion to 60-70%, not optimal for PCI given the ostial nature and size of the vessel. EF 55-60% with no WMA. It was recommended he continue optimal medical therapy.   last Echo & Myoview in 08/2014 - reviewed in [facility] [place] - persistent Mod AS. Normal Nuc Patient exercises regularly 3 times weekly, usually without issues.   I last saw him in Sept 2016 as 2nd f/u from hospitalization    Interval History: Victor [name] presents today to fairly well. He is now pretty much back to doing his workouts on the  elliptical and stationary bicycle for about an hour and a half a day today's weight gain. He has not really noted much in the way of any of the dizziness or wooziness. He is not really checking his pulse is marked, has not noted that his heart rate is slower - and that it is hard for him to feel like he has energy to "get up and go".  He has not noted palpitations. From the CAD standpoint, he denies any chest pain or shortness of breath with rest or exertion.  No PND, orthopnea or edema.  Minimal  palpitations, with NO lightheadedness, dizziness, weakness or syncope/near syncope since his hospital visit. No  TIA/amaurosis fugax symptoms. No melena, hematochezia, hematuria, or epstaxis. No claudication.   Past Medical History  Diagnosis Date  . CAD S/P percutaneous coronary angioplasty 07/2006    PCI to RCA - Promus DES 2.5 mm x 23 mm; 2D ECHO - EF >55%, moderate calcification of the aortic valve leaflets  . Aortic valve stenosis, moderate 08/2010; 12/05/2012; 08/2014    a. Mild to moderate Aortic Stenosis;; b. Stable in 2014 --> EF 55-60%, Mod AoV Calcification with mild-moderate stenosis; mild MR. (Stable); c.Mod AS (mean Grad 20 mmHg, AVA 0.8), Mod MR.   . Arthritis   . Diabetes mellitus   . High cholesterol   . Hypertension     "from the diabetes"  . Angina   . GERD (gastroesophageal reflux disease)   . BPH (benign prostatic hypertrophy)   . Bilateral carotid artery disease ([place])     CAROTID DOPPLER,05/21/2008 - Right and left ICA-0-49% diameter reduction, left CCA-0-49% diameter reductiion    Past Surgical History  Procedure Laterality Date  . Transurethral resection of prostate  ~ 2010  . Colectomy  ~ 2000  . Cardiac catheterization  03/21/2011    Continue to optimize medical therapy  . Cardiac catheterization with pci  08/27/2006    RCA-mid - 2.5x9mm Promus stent  . Nm myoview ltd  03/09/2011    Post-stress EF 76%, ECG positive for ischemia, normal study  . Left  heart catheterization with coronary angiogram N/A 03/22/2011    Procedure: LEFT HEART CATHETERIZATION WITH CORONARY ANGIOGRAM;  Surgeon: Leonie Man, MD;  Location: [facility] CATH LAB;  Service: Cardiovascular;  Laterality: N/A;  . Cataract extraction w/phaco Right 03/08/2015    Procedure: CATARACT EXTRACTION PHACO AND INTRAOCULAR LENS PLACEMENT (IOC);  Surgeon: Estill Cotta, MD;  Location: [facility] ORS;  Service: Ophthalmology;  Laterality: Right;  [place]: 01:29.4   . Nm myoview ltd  08/2014    LOW RISK. NORMAL.  EF 45-54%.   . Transthoracic echocardiogram  08/2014    Mod Conc LVH. EF 55-60%. Normal WM. Gr 1 DD.Mod AS & trivial AI  (mean Grad 20 mmHg, AVA ~0.8 cm2), Mod MR, Mild LA Dilation, PAP ~ 40 mmHg    ROS: A comprehensive was performed.  If not mentioned in history of present illness: Review of Systems  Constitutional: Negative for malaise/fatigue.  HENT: Negative for nosebleeds.   Respiratory: Negative for cough.   Cardiovascular: Negative for claudication.  Gastrointestinal: Negative for blood in stool and melena.  Genitourinary: Negative for hematuria.  Musculoskeletal: Negative for joint pain.  Neurological: Negative for headaches.       Started to notice balance issues with offset center balance when leaning forward getting forward over his hips toward his toes.  Endo/Heme/Allergies: Does not bruise/bleed easily.  All other systems reviewed and are negative.   Prior to Admission medications   Medication Sig Start Date End Date Taking? Authorizing Provider  aspirin EC 81 MG tablet Take 81 mg by mouth daily.   Yes Historical Provider, MD  calcium carbonate (OS-CAL) 600 MG TABS tablet Take 600 mg by mouth daily.   Yes Historical Provider, MD  cetirizine (ZYRTEC) 10 MG tablet Take 10 mg by mouth daily.   Yes Historical Provider, MD  Cholecalciferol (VITAMIN D3) 2000 UNITS TABS Take 1 tablet by mouth daily.   Yes Historical Provider, MD  clopidogrel (PLAVIX) 75 MG tablet Take 75 mg by mouth daily.   Yes Historical Provider, MD  Coenzyme Q10 (CO Q 10 PO) Take by mouth daily.   Yes Historical Provider, MD  donepezil (ARICEPT) 10 MG tablet Take 10 mg by mouth daily. 11/11/12  Yes Historical Provider, MD  EPIPEN 2-PAK 0.3 MG/0.3ML SOAJ injection Inject 0.3 mLs as directed as directed. 06/01/14  Yes Historical Provider, MD  ezetimibe-simvastatin (VYTORIN) 10-40 MG per tablet Take 1 tablet by mouth at bedtime.   Yes Historical Provider, MD  fexofenadine (ALLEGRA) 180 MG tablet Take 180 mg by mouth daily.   Yes Historical Provider, MD  fluticasone (FLONASE) 50 MCG/ACT nasal spray Place 2 sprays into both nostrils  daily.   Yes Historical Provider, MD  gabapentin (NEURONTIN) 100 MG capsule Take 200 mg by mouth at bedtime.   Yes Historical Provider, MD  ipratropium (ATROVENT) 0.03 % nasal spray Place 2 sprays into both nostrils 3 (three) times daily as needed for rhinitis.   Yes Historical Provider, MD  lansoprazole (PREVACID) 30 MG capsule Take 30 mg by mouth daily. 11/04/12  Yes Historical Provider, MD  Magnesium Sulfate 70 MG CAPS Take 70 mg by mouth daily.   Yes Historical Provider, MD  metoprolol tartrate (LOPRESSOR) 25 MG tablet Take 1 tablet (25 mg total) by mouth 2 (two) times daily. 10/19/14  Yes Minna Merritts, MD  Misc Natural Products (OSTEO BI-FLEX ADV JOINT SHIELD PO) Take 1 tablet by mouth daily.   Yes Historical Provider, MD  Multiple Vitamin (MULITIVITAMIN WITH MINERALS) TABS Take 1 tablet  by mouth daily.   Yes Historical Provider, MD  Olopatadine HCl 0.2 % SOLN Apply 1 drop to eye daily as needed. 11/11/13  Yes Historical Provider, MD  Omega 3 1200 MG CAPS Take 1,200 mg by mouth 2 (two) times daily.   Yes Historical Provider, MD  oxybutynin (DITROPAN XL) 15 MG 24 hr tablet Take 15 mg by mouth daily.   Yes Historical Provider, MD  Saw Palmetto 450 MG CAPS Take 2 capsules by mouth daily.   Yes Historical Provider, MD  sitaGLIPtin (JANUVIA) 100 MG tablet Take by mouth. 10/20/14 10/20/15 Yes Historical Provider, MD  Tamsulosin HCl (FLOMAX) 0.4 MG CAPS Take 0.4 mg by mouth daily. Take 30 minutes after same meal each day.   Yes Historical Provider, MD  vitamin B-12 (CYANOCOBALAMIN) 1000 MCG tablet Take 1,000 mcg by mouth daily.   Yes Historical Provider, MD   Allergies  Allergen Reactions  . Oraxyl [Doxycycline Hyclate] Swelling    "started swelling in my mouth & tongue; had to go to emergency room; really bad reaction"  . Azithromycin Rash  . Cephalosporins Other (See Comments)    unknown  . Pseudoephedrine Other (See Comments)    unknown  . Amoxicillin Rash  . Codeine Itching and Rash  .  Levofloxacin Rash  . Sulfa Antibiotics Rash    Social History   Social History  . Marital Status: Married    Spouse Name: N/A  . Number of Children: N/A  . Years of Education: N/A   Social History Main Topics  . Smoking status: Never Smoker   . Smokeless tobacco: Former Systems developer    Types: Chew    Quit date: 01/31/1996  . Alcohol Use: 3.6 oz/week    6 Glasses of wine per week  . Drug Use: No  . Sexual Activity: Yes   Other Topics Concern  . None   Social History Narrative   Father of 2, grandfather 31.   Exercise for almost 2 hours a day, doing least 20 minutes on the elliptical trainer. He does his to 4 days a week. He'll also does weights and stretching exercises.   Family History  Problem Relation Age of Onset  . Family history unknown: Yes    Wt Readings from Last 3 Encounters:  04/28/15 172 lb 8 oz (78.245 kg)  03/08/15 172 lb (78.019 kg)  10/21/14 169 lb (76.658 kg)    PHYSICAL EXAM BP 110/64 mmHg  Pulse 67  Ht 5\' 9"  (1.753 m)  Wt 172 lb 8 oz (78.245 kg)  BMI 25.46 kg/m2 General appearance: alert, cooperative, appears stated age, no distress and Pleasant mood and affect Neck: no carotid bruit and no JVD HEENT: /AT, EOMI, MMM, anicteric sclera Lungs: clear to auscultation bilaterally, normal percussion bilaterally and Nonlabored, normal excursion. Heart: regular rate and rhythm, S1& S2 normal, no S3 or S4, systolic murmur: systolic ejection 2/6, crescendo, decrescendo, harsh and Mid-peaking mid peaking at 2nd right intercostal space, radiates to carotids and no rub Abdomen: soft, non-tender; bowel sounds normal; no masses, no organomegaly Extremities: extremities normal, atraumatic, no cyanosis or edema Pulses: 2+ and symmetric Neurologic: Grossly normal;   Adult ECG Report - not checked Sinus rhythm with first-degree A-V block (PR interval 224), left axis deviation (-33), borderline LVH but otherwise no normal. -- Stable EKG  Other studies  Reviewed: Additional studies/ records that were reviewed today include:   Recent Labs: Followed by PCP - not available   ASSESSMENT / PLAN: Problem List Items Addressed This  Visit    Hyperlipidemia with target LDL less than 70 (Chronic)    On Vytorin. Tolerating well. Labs monitored by PCP - due for check this summer.      Relevant Medications   metoprolol tartrate (LOPRESSOR) 25 MG tablet   Essential hypertension (Chronic)    Well controlled. Will reduce AM dose of Metoprolol to 12.5 mg to allow for improved energy.      Relevant Medications   metoprolol tartrate (LOPRESSOR) 25 MG tablet   Other Relevant Orders   EKG 12-Lead (Completed)   CAD S/P percutaneous coronary angioplasty --> PCI RCA Promus DES 2.5 mm x 23 mm - Primary (Chronic)    No recurrent angina symptoms. Recent Myoview was negative. On DAPT, low dose BB & statin.  With borderline BP - no ACE-I or ARB. We had discussed that it is OK to hold Plavix for procedures.   Can stop ASA.      Relevant Medications   metoprolol tartrate (LOPRESSOR) 25 MG tablet   Other Relevant Orders   EKG 12-Lead (Completed)   Bradycardia (Chronic)    Is noting a bit of low energy (lack of get-up & go) during the day. Will reduce AM dose of Metoprolol.      Balance problem    Discussed importance of using a walker. Maintaining an extra point of contact. Also recommended discussing with his PCP thoughts on possible rehabilitation to work on balance issues.      Aortic valve stenosis, moderate (Chronic)    Stable moderate stenosis on recent echo. Recheck in August 2017. Discussed symptoms of concern      Relevant Medications   metoprolol tartrate (LOPRESSOR) 25 MG tablet    Other Visit Diagnoses    Aortic valve stenosis        Relevant Medications    metoprolol tartrate (LOPRESSOR) 25 MG tablet    Other Relevant Orders    Echocardiogram       Current medicines are reviewed at length with the patient today. (+/-  concerns) His PCP recently stopped Metformin due to side effects.  The following changes have been made: None Studies Ordered:   Orders Placed This Encounter  Procedures  . EKG 12-Lead  . Echocardiogram    ROV: 6 months    HARDING, Leonie Green, M.D., M.S. Interventional Cardiologist   Pager # [phone]

## 2015-05-02 ENCOUNTER — Encounter: Payer: Self-pay | Admitting: Cardiology

## 2015-05-02 DIAGNOSIS — R2689 Other abnormalities of gait and mobility: Secondary | ICD-10-CM | POA: Insufficient documentation

## 2015-05-02 DIAGNOSIS — R29818 Other symptoms and signs involving the nervous system: Secondary | ICD-10-CM | POA: Insufficient documentation

## 2015-05-02 NOTE — Assessment & Plan Note (Signed)
Well controlled. Will reduce AM dose of Metoprolol to 12.5 mg to allow for improved energy.

## 2015-05-02 NOTE — Assessment & Plan Note (Signed)
Is noting a bit of low energy (lack of get-up & go) during the day. Will reduce AM dose of Metoprolol.

## 2015-05-02 NOTE — Assessment & Plan Note (Addendum)
Stable moderate stenosis on recent echo. Recheck in August 2017. Discussed symptoms of concern

## 2015-05-02 NOTE — Assessment & Plan Note (Signed)
Discussed importance of using a walker. Maintaining an extra point of contact. Also recommended discussing with his PCP thoughts on possible rehabilitation to work on balance issues.

## 2015-05-02 NOTE — Assessment & Plan Note (Signed)
On Vytorin. Tolerating well. Labs monitored by PCP - due for check this summer.

## 2015-05-02 NOTE — Assessment & Plan Note (Addendum)
No recurrent angina symptoms. Recent Myoview was negative. On DAPT, low dose BB & statin.  With borderline BP - no ACE-I or ARB. We had discussed that it is OK to hold Plavix for procedures.   Can stop ASA.

## 2015-06-07 ENCOUNTER — Telehealth: Payer: Self-pay | Admitting: Cardiology

## 2015-06-07 NOTE — Telephone Encounter (Signed)
Returned call to patient.   Patient saw PCP this AM - PCP recommended to notify MD of lethargy/fatigue for 2-3+ weeks Patient states he has lost his energy  Patient said his HR is good and BP is good - VS stable per PCP  PCP advised patient to check with Dr. [name] to see if he wanted to move it up sooner.   Patient states he has not done lab work yet w/PCP - he will get it after Thursday 5/11  Will defer to MD to advise - request to add on any labs?

## 2015-06-07 NOTE — Telephone Encounter (Signed)
Pt called in stating that he saw his PCP , Dr. Kerney Elbe and the doctor felt it would be vital information to share with Dr. [name] concerning the pt has been very lethargic lately. He thought this may be a concern because he has a heart murmur and thought that maybe the pt's Echo could be moved up sooner. Please f/u with pt.  Thanks

## 2015-06-08 NOTE — Telephone Encounter (Signed)
Med list updated

## 2015-06-08 NOTE — Telephone Encounter (Signed)
Patient called with MD advice - he voiced understanding. Advised he call back in about a week to let us know how he was doing. Explained that if his symptoms have improved, Dr. [name] wants to switch him to the long acting version of metoprolol.

## 2015-06-08 NOTE — Telephone Encounter (Signed)
I am not sure that this is related to his BB, b/c this shouldn't be an acute thing.  Anyway, let's hold his AM dose of BB & cut PM dose in 1/2.  If that helps, then my preference would be to switch to Toprol 12.5 mg daily.   Leonie Man, MD

## 2015-06-08 NOTE — Addendum Note (Signed)
Addended by: Fidel Levy on: 06/08/2015 10:45 AM   Modules accepted: Orders, Medications

## 2015-06-11 ENCOUNTER — Other Ambulatory Visit
Admission: RE | Admit: 2015-06-11 | Discharge: 2015-06-11 | Disposition: A | Discharge status: Home / Self Care | Payer: Medicare Other | Source: Ambulatory Visit | Attending: Nurse Practitioner | Admitting: Nurse Practitioner

## 2015-06-11 DIAGNOSIS — R197 Diarrhea, unspecified: Secondary | ICD-10-CM | POA: Diagnosis present

## 2015-06-11 LAB — GASTROINTESTINAL PANEL BY PCR, STOOL (REPLACES STOOL CULTURE)

## 2015-06-11 LAB — C DIFFICILE QUICK SCREEN W PCR REFLEX
C Diff antigen: NEGATIVE
C Diff interpretation: NEGATIVE
C Diff toxin: NEGATIVE

## 2015-07-06 DIAGNOSIS — R194 Change in bowel habit: Secondary | ICD-10-CM | POA: Insufficient documentation

## 2015-08-26 ENCOUNTER — Ambulatory Visit (INDEPENDENT_AMBULATORY_CARE_PROVIDER_SITE_OTHER): Payer: Medicare Other

## 2015-08-26 ENCOUNTER — Other Ambulatory Visit: Payer: Self-pay

## 2015-08-26 DIAGNOSIS — I350 Nonrheumatic aortic (valve) stenosis: Secondary | ICD-10-CM

## 2015-08-26 HISTORY — PX: TRANSTHORACIC ECHOCARDIOGRAM: SHX275

## 2015-08-30 LAB — ECHOCARDIOGRAM COMPLETE
AO mean calculated velocity dopler: 204 cm/s
AV Area VTI index: 0.39 cm2/m2
AV Area VTI: 0.82 cm2
AV Area mean vel: 0.91 cm2
AV Mean grad: 21 mmHg
AV Peak grad: 48 mmHg
AV VEL mean LVOT/AV: 0.29
AV area mean vel ind: 0.47 cm2/m2
AV peak Index: 0.43
AV pk vel: 346 cm/s
AV vel: 0.75
Ao pk vel: 0.26 m/s
Ao-asc: 32 cm
E decel time: 437 msec
E/e' ratio: 8.93
FS: 41 % (ref 28–44)
IVS/LV PW RATIO, ED: 1.36
LA ID, A-P, ES: 31 mm
LA diam end sys: 31 mm
LA diam index: 1.6 cm/m2
LA vol A4C: 42.4 ml
LA vol index: 24.6 mL/m2
LA vol: 47.7 mL
LV E/e' medial: 8.93
LV E/e'average: 8.93
LV PW d: 12.1 mm — AB (ref 0.6–1.1)
LV e' LATERAL: 5.87 cm/s
LVOT MV VTI INDEX: 0.99 cm2/m2
LVOT MV VTI: 1.92
LVOT SV: 54 mL
LVOT VTI: 17.2 cm
LVOT area: 3.14 cm2
LVOT diameter: 20 mm
LVOT peak VTI: 0.24 cm
LVOT peak vel: 90.9 cm/s
MV Annulus VTI: 28.1 cm
MV Dec: 437
MV M vel: 62.1
MV pk A vel: 115 m/s
MV pk E vel: 52.4 m/s
Mean grad: 2 mmHg
PV Reg vel dias: 97.7 cm/s
Reg peak vel: 290 cm/s
TAPSE: 23.9 mm
TDI e' lateral: 5.87
TDI e' medial: 4.57
TR max vel: 290 cm/s
VTI: 72 cm
Valve area index: 0.39
Valve area: 0.75 cm2

## 2015-09-01 ENCOUNTER — Telehealth: Payer: Self-pay | Admitting: Cardiology

## 2015-09-01 NOTE — Telephone Encounter (Signed)
Attempted to return call to patient - phone rang continuously - no answer

## 2015-09-01 NOTE — Telephone Encounter (Signed)
New message ° ° ° ° ° °Returning a call to the nurse to get echo results °

## 2015-09-02 NOTE — Telephone Encounter (Signed)
Follow up ° ° ° ° ° °Returning the nurses call ° ° ° ° ° ° °

## 2015-09-03 NOTE — Telephone Encounter (Signed)
Patient called with echo results. Reminded of OV 09/15/15

## 2015-09-15 ENCOUNTER — Encounter: Payer: Self-pay | Admitting: Cardiology

## 2015-09-15 ENCOUNTER — Ambulatory Visit (INDEPENDENT_AMBULATORY_CARE_PROVIDER_SITE_OTHER): Payer: Medicare Other | Admitting: Cardiology

## 2015-09-15 VITALS — BP 122/70 | HR 73 | Ht 69.0 in | Wt 172.5 lb

## 2015-09-15 DIAGNOSIS — I10 Essential (primary) hypertension: Secondary | ICD-10-CM

## 2015-09-15 DIAGNOSIS — I493 Ventricular premature depolarization: Secondary | ICD-10-CM | POA: Diagnosis not present

## 2015-09-15 DIAGNOSIS — I350 Nonrheumatic aortic (valve) stenosis: Secondary | ICD-10-CM | POA: Diagnosis not present

## 2015-09-15 DIAGNOSIS — I2510 Atherosclerotic heart disease of native coronary artery without angina pectoris: Secondary | ICD-10-CM

## 2015-09-15 DIAGNOSIS — E785 Hyperlipidemia, unspecified: Secondary | ICD-10-CM

## 2015-09-15 DIAGNOSIS — Z9861 Coronary angioplasty status: Secondary | ICD-10-CM

## 2015-09-15 DIAGNOSIS — R001 Bradycardia, unspecified: Secondary | ICD-10-CM | POA: Diagnosis not present

## 2015-09-15 NOTE — Progress Notes (Signed)
PCP: Sofie Hartigan, MD  Clinic Note: Chief Complaint  Patient presents with  . Other    6 month follow up. "doing well."   . Coronary Artery Disease  . Cardiac Valve Problem    Moderate-severe aortic stenosis    HPI: Victor [name] is a 82 y.o. male with a PMH below who presents today for f/u for CAD-PCI & Palpitations/Bradycardia..  He has known CAD s/p PCI to RCA in 2008. Echo at that time showed EF >55% with moderate calcification of the aortic valve leaflets.   Follow up echo in 2012 showed mild to moderate aortic stenosis.   He underwent cardiac cath in 2013 in the setting of positive EKG (inferior TWI) with negative nuclear stress test. No ischemic symptoms at that time. Cardiac cath showed widely patient RCA stent with mild progression of proximal RCA disease to moderate 50-60% lesion. There was progression of ostial D3 lesion to 60-70%, not optimal for PCI given the ostial nature and size of the vessel. EF 55-60% with no WMA. It was recommended he continue optimal medical therapy.   last Echo & Myoview in 08/2014 - reviewed in [place] - persistent Mod AS. Normal Nuc Patient exercises regularly 3 times weekly, usually without issues.  ~1 1/2 hr - wgts, 15 min TM/bike   I last saw him in March 2017 as f/u from hospitalization - Sx PVCs - bigeminy  Echo 08/26/2015: - Left ventricle: The cavity size was normal. There was mild   concentric hypertrophy. Systolic function was normal. The   estimated ejection fraction was in the range of 55% to 60%. Wall   motion was normal; there were no regional wall motion   abnormalities. Doppler parameters are consistent with abnormal   left ventricular relaxation (grade 1 diastolic dysfunction). - Aortic valve: There was severe stenosis. Mean gradient (S): 24 mm   Hg. Valve area (VTI): 0.75 cm^2. - Mitral valve: There was mild to moderate regurgitation. - Right ventricle: The cavity size was mildly dilated. Wall   thickness was  normal. - Pulmonary arteries: Systolic pressure was mildly increased. PA   peak pressure: 40 mm Hg (S).  Impressions:  - Aortic stenosis has worsened slightly since last year. The valve   is more calcified with more restricted opening.   Interval History: [name] presents today to fairly well. He is now pretty much back to doing his workouts on the  elliptical and stationary bicycle for about an hour and a half a day today's weight gain. He has not really noted much in the way of any of the dizziness or wooziness. He seems to be doing better overall with his energy level. He is concerned a little bit about the change in his aortic valve findings.  He does not seem to be having as many symptoms of bigeminy since I last saw him. He has not noted palpitations. From the CAD standpoint, he denies any chest pain or shortness of breath with rest or exertion.  No PND, orthopnea or edema.  Minimal  palpitations, with NO lightheadedness, dizziness, weakness or syncope/near syncope since his hospital visit. No TIA/amaurosis fugax symptoms. No melena, hematochezia, hematuria, or epstaxis. No claudication.   Past Medical History:  Diagnosis Date  . Angina   . Aortic valve stenosis, moderate 08/2010; 12/05/2012; 08/2014   a. Mild to moderate Aortic Stenosis;; b. Stable in 2014 --> EF 55-60%, Mod AoV Calcification with mild-moderate stenosis; mild MR. (Stable); c.Mod AS (mean Grad 20 mmHg, AVA 0.8),  Mod MR.   . Arthritis   . Bilateral carotid artery disease ([place])    CAROTID DOPPLER,05/21/2008 - Right and left ICA-0-49% diameter reduction, left CCA-0-49% diameter reductiion  . BPH (benign prostatic hypertrophy)   . CAD S/P percutaneous coronary angioplasty 07/2006   PCI to RCA - Promus DES 2.5 mm x 23 mm; 2D ECHO - EF >55%, moderate calcification of the aortic valve leaflets  . Diabetes mellitus   . GERD (gastroesophageal reflux disease)   . High cholesterol   . Hypertension    "from the diabetes"     Past Surgical History:  Procedure Laterality Date  . CARDIAC CATHETERIZATION  03/21/2011   Continue to optimize medical therapy  . CARDIAC CATHETERIZATION with PCI  08/27/2006   RCA-mid - 2.5x28mm Promus stent  . CATARACT EXTRACTION W/PHACO Right 03/08/2015   Procedure: CATARACT EXTRACTION PHACO AND INTRAOCULAR LENS PLACEMENT (IOC);  Surgeon: Estill Cotta, MD;  Location: [facility] ORS;  Service: Ophthalmology;  Laterality: Right;  [place]: 01:29.4   . COLECTOMY  ~ 2000  . LEFT HEART CATHETERIZATION WITH CORONARY ANGIOGRAM N/A 03/22/2011   Procedure: LEFT HEART CATHETERIZATION WITH CORONARY ANGIOGRAM;  Surgeon: Leonie Man, MD;  Location: [facility] CATH LAB;  Service: Cardiovascular;  Laterality: N/A;  . NM MYOVIEW LTD  03/09/2011   Post-stress EF 76%, ECG positive for ischemia, normal study  . NM MYOVIEW LTD  08/2014   LOW RISK. NORMAL.  EF 45-54%.   . TRANSTHORACIC ECHOCARDIOGRAM  08/2014   Mod Conc LVH. EF 55-60%. Normal WM. Gr 1 DD.Mod AS & trivial AI (mean Grad 20 mmHg, AVA ~0.8 cm2), Mod MR, Mild LA Dilation, PAP ~ 40 mmHg  . TRANSURETHRAL RESECTION OF PROSTATE  ~ 2010    ROS: A comprehensive was performed.  If not mentioned in history of present illness: Review of Systems  Constitutional: Negative for malaise/fatigue.  HENT: Negative for nosebleeds.   Respiratory: Negative for cough and shortness of breath.   Cardiovascular: Negative for palpitations and claudication.  Gastrointestinal: Positive for heartburn. Negative for blood in stool and melena.  Genitourinary: Negative for hematuria.  Musculoskeletal: Negative for joint pain.  Neurological: Negative for dizziness and headaches.       Started to notice balance issues with offset center balance when leaning forward getting forward over his hips toward his toes.  Endo/Heme/Allergies: Does not bruise/bleed easily.  Psychiatric/Behavioral: Negative for depression and memory loss. The patient does not have insomnia.   All other systems  reviewed and are negative.   Prior to Admission medications   Medication Sig Start Date End Date Taking? Authorizing Provider  aspirin EC 81 MG tablet Take 81 mg by mouth daily.   Yes Historical Provider, MD  calcium carbonate (OS-CAL) 600 MG TABS tablet Take 600 mg by mouth daily.   Yes Historical Provider, MD  cetirizine (ZYRTEC) 10 MG tablet Take 10 mg by mouth daily.   Yes Historical Provider, MD  Cholecalciferol (VITAMIN D3) 2000 UNITS TABS Take 1 tablet by mouth daily.   Yes Historical Provider, MD  clopidogrel (PLAVIX) 75 MG tablet Take 75 mg by mouth daily.   Yes Historical Provider, MD  Coenzyme Q10 (CO Q 10 PO) Take by mouth daily.   Yes Historical Provider, MD  donepezil (ARICEPT) 10 MG tablet Take 10 mg by mouth daily. 11/11/12  Yes Historical Provider, MD  EPIPEN 2-PAK 0.3 MG/0.3ML SOAJ injection Inject 0.3 mLs as directed as directed. 06/01/14  Yes Historical Provider, MD  ezetimibe-simvastatin (VYTORIN) 10-40 MG per  tablet Take 1 tablet by mouth at bedtime.   Yes Historical Provider, MD  fexofenadine (ALLEGRA) 180 MG tablet Take 180 mg by mouth daily.   Yes Historical Provider, MD  fluticasone (FLONASE) 50 MCG/ACT nasal spray Place 2 sprays into both nostrils as needed.    Yes Historical Provider, MD  gabapentin (NEURONTIN) 100 MG capsule Take 200 mg by mouth at bedtime.   Yes Historical Provider, MD  glipiZIDE (GLUCOTROL XL) 2.5 MG 24 hr tablet Take 2.5 mg by mouth daily with breakfast.   Yes Historical Provider, MD  ipratropium (ATROVENT) 0.03 % nasal spray Place 2 sprays into both nostrils 3 (three) times daily as needed for rhinitis.   Yes Historical Provider, MD  lansoprazole (PREVACID) 30 MG capsule Take 30 mg by mouth daily. 11/04/12  Yes Historical Provider, MD  Magnesium Sulfate 70 MG CAPS Take 70 mg by mouth daily.   Yes Historical Provider, MD  metoprolol tartrate (LOPRESSOR) 25 MG tablet Take 12.5 mg by mouth every evening. was taking 12.5mg QAM & 25mg  QHS - changes on  06/08/15 per MD/triage call   Yes Historical Provider, MD  Misc Natural Products (OSTEO BI-FLEX ADV JOINT SHIELD PO) Take 1 tablet by mouth daily.   Yes Historical Provider, MD  Multiple Vitamin (MULITIVITAMIN WITH MINERALS) TABS Take 1 tablet by mouth daily.   Yes Historical Provider, MD  MYRBETRIQ 50 MG TB24 tablet Take 50 mg by mouth daily.  04/18/15  Yes Historical Provider, MD  Olopatadine HCl 0.2 % SOLN Apply 1 drop to eye daily as needed. Reported on 03/08/2015 11/11/13  Yes Historical Provider, MD  Omega 3 1200 MG CAPS Take 1,200 mg by mouth 2 (two) times daily.   Yes Historical Provider, MD  Saw Palmetto 450 MG CAPS Take 2 capsules by mouth daily.   Yes Historical Provider, MD  Tamsulosin HCl (FLOMAX) 0.4 MG CAPS Take 0.4 mg by mouth daily. Take 30 minutes after same meal each day.   Yes Historical Provider, MD  vitamin B-12 (CYANOCOBALAMIN) 1000 MCG tablet Take 1,000 mcg by mouth daily.   Yes Historical Provider, MD     Allergies  Allergen Reactions  . Oraxyl [Doxycycline Hyclate] Swelling    "started swelling in my mouth & tongue; had to go to emergency room; really bad reaction"  . Azithromycin Rash  . Cephalosporins Other (See Comments)    unknown  . Pseudoephedrine Other (See Comments)    unknown  . Amoxicillin Rash  . Codeine Itching and Rash  . Levofloxacin Rash  . Sulfa Antibiotics Rash    Social History   Social History  . Marital status: Married    Spouse name: N/A  . Number of children: N/A  . Years of education: N/A   Social History Main Topics  . Smoking status: Never Smoker  . Smokeless tobacco: Former Systems developer    Types: Chew    Quit date: 01/31/1996  . Alcohol use 3.6 oz/week    6 Glasses of wine per week  . Drug use: No  . Sexual activity: Yes   Other Topics Concern  . None   Social History Narrative   Father of 2, grandfather 77.   Exercise for almost 2 hours a day, doing least 20 minutes on the elliptical trainer. He does his to 4 days a week. He'll  also does weights and stretching exercises.   Family History  Problem Relation Age of Onset  . Family history unknown: Yes    Wt Readings from Last 3  Encounters:  09/15/15 172 lb 8 oz (78.2 kg)  04/28/15 172 lb 8 oz (78.2 kg)  03/08/15 172 lb (78 kg)    PHYSICAL EXAM BP 122/70 (BP Location: Left Arm, Patient Position: Sitting, Cuff Size: Normal)   Pulse 73   Ht 5\' 9"  (1.753 m)   Wt 172 lb 8 oz (78.2 kg)   BMI 25.47 kg/m  General appearance: alert, cooperative, appears stated age, no distress and Pleasant mood and affect Neck: no carotid bruit and no JVD HEENT: [place]/AT, EOMI, MMM, anicteric sclera Lungs: clear to auscultation bilaterally, normal percussion bilaterally and Nonlabored, normal excursion. Heart: regular rate and rhythm, S1& S2 normal, no S3 or S4, systolic murmur: systolic ejection 2/6, crescendo, decrescendo, harsh and Mid-peaking mid peaking at 2nd right intercostal space, radiates to carotids and no rub Abdomen: soft, non-tender; bowel sounds normal; no masses, no organomegaly Extremities: extremities normal, atraumatic, no cyanosis or edema Pulses: 2+ and symmetric Neurologic: Grossly normal;   Adult ECG Report - not checked Sinus rhythm with first-degree A-V block (PR interval 216) - rate 73 bpm, left axis deviation (-39), borderline LVH but otherwise no normal. -- Stable EKG  Other studies Reviewed: Additional studies/ records that were reviewed today include:   Recent Labs: Followed by PCP - not available   ASSESSMENT / PLAN: Problem List Items Addressed This Visit    Symptomatic PVCs    On current dose of beta blocker doing well. Seems to have resolved at this point. Energy improved with having a reduced dose of beta blocker.      Hyperlipidemia with target LDL less than 70 (Chronic)    On board torn that he is tolerating well. Labs monitored by PCP.      Essential hypertension (Chronic)    Well-controlled on low-dose metoprolol. Energy improved  overall, and blood pressure looks better.      Relevant Orders   EKG 12-Lead (Completed)   CAD S/P percutaneous coronary angioplasty --> PCI RCA Promus DES 2.5 mm x 23 mm - Primary (Chronic)    No recurrent anginal symptoms. Remains on aspirin plus Plavix with no bleeding issues. Is stable on Vytorin. On stable dose low-dose metoprolol, not on an ARB or ACE inhibitor.      Relevant Orders   EKG 12-Lead (Completed)   Bradycardia (Chronic)    Better with lower dose of beta blocker      Aortic valve stenosis, moderate (Chronic)    It appears that the stenosis is now progressed a little bit to be in the "severe" range. The mean gradient has not changed that much. Plan to follow-up echo in one year prior to follow-up.  Monitor for signs of angina, syncope, or failure. I conferred with the patient that at this current level stenosis, I doubt he will have any symptoms. Will likely be several years but forgets to be critical.      Relevant Orders   ECHOCARDIOGRAM COMPLETE    Other Visit Diagnoses   None.     Current medicines are reviewed at length with the patient today. (+/- concerns)    The following changes have been made:  Medication Instructions:  Your physician recommends that you continue on your current medications as directed. Please refer to the Current Medication list given to you today.   Labwork: none  Testing/Procedures: Your physician has requested that you have an echocardiogram in one year prior to your office visit. Echocardiography is a painless test that uses sound waves to create images of  your heart. It provides your doctor with information about the size and shape of your heart and how well your heart's chambers and valves are working. This procedure takes approximately one hour. There are no restrictions for this procedure.    Follow-Up: Your physician wants you to follow-up in: one year with Dr. [name] in [place].  Studies Ordered:   Orders  Placed This Encounter  Procedures  . EKG 12-Lead  . ECHOCARDIOGRAM COMPLETE    ROV: 6 months    [name], M.D., M.S. Interventional Cardiologist   Pager # [phone]

## 2015-09-15 NOTE — Patient Instructions (Addendum)
Medication Instructions:  Your physician recommends that you continue on your current medications as directed. Please refer to the Current Medication list given to you today.   Labwork: none  Testing/Procedures: Your physician has requested that you have an echocardiogram in one year prior to your office visit. Echocardiography is a painless test that uses sound waves to create images of your heart. It provides your doctor with information about the size and shape of your heart and how well your heart's chambers and valves are working. This procedure takes approximately one hour. There are no restrictions for this procedure.    Follow-Up: Your physician wants you to follow-up in: one year with Dr. [name] in [place]. You will receive a reminder letter in the mail two months in advance. If you don't receive a letter, please call our office to schedule the follow-up appointment.   Any Other Special Instructions Will Be Listed Below (If Applicable).     If you need a refill on your cardiac medications before your next appointment, please call your pharmacy.  Echocardiogram An echocardiogram, or echocardiography, uses sound waves (ultrasound) to produce an image of your heart. The echocardiogram is simple, painless, obtained within a short period of time, and offers valuable information to your health care provider. The images from an echocardiogram can provide information such as:  Evidence of coronary artery disease (CAD).  Heart size.  Heart muscle function.  Heart valve function.  Aneurysm detection.  Evidence of a past heart attack.  Fluid buildup around the heart.  Heart muscle thickening.  Assess heart valve function. LET [facility] CARE PROVIDER KNOW ABOUT:  Any allergies you have.  All medicines you are taking, including vitamins, herbs, eye drops, creams, and over-the-counter medicines.  Previous problems you or members of your family have had with the use  of anesthetics.  Any blood disorders you have.  Previous surgeries you have had.  Medical conditions you have.  Possibility of pregnancy, if this applies. BEFORE THE PROCEDURE  No special preparation is needed. Eat and drink normally.  PROCEDURE   In order to produce an image of your heart, gel will be applied to your chest and a wand-like tool (transducer) will be moved over your chest. The gel will help transmit the sound waves from the transducer. The sound waves will harmlessly bounce off your heart to allow the heart images to be captured in real-time motion. These images will then be recorded.  You may need an IV to receive a medicine that improves the quality of the pictures. AFTER THE PROCEDURE You may return to your normal schedule including diet, activities, and medicines, unless your health care provider tells you otherwise.   This information is not intended to replace advice given to you by your health care provider. Make sure you discuss any questions you have with your health care provider.   Document Released: 01/14/2000 Document Revised: 02/06/2014 Document Reviewed: 09/23/2012 Elsevier Interactive Patient Education [employer].

## 2015-09-17 ENCOUNTER — Encounter: Payer: Self-pay | Admitting: Cardiology

## 2015-09-17 NOTE — Assessment & Plan Note (Signed)
Better with lower dose of beta blocker

## 2015-09-17 NOTE — Assessment & Plan Note (Signed)
On board torn that he is tolerating well. Labs monitored by PCP.

## 2015-09-17 NOTE — Assessment & Plan Note (Signed)
Well-controlled on low-dose metoprolol. Energy improved overall, and blood pressure looks better.

## 2015-09-17 NOTE — Assessment & Plan Note (Signed)
It appears that the stenosis is now progressed a little bit to be in the "severe" range. The mean gradient has not changed that much. Plan to follow-up echo in one year prior to follow-up.  Monitor for signs of angina, syncope, or failure. I conferred with the patient that at this current level stenosis, I doubt he will have any symptoms. Will likely be several years but forgets to be critical.

## 2015-09-17 NOTE — Assessment & Plan Note (Signed)
No recurrent anginal symptoms. Remains on aspirin plus Plavix with no bleeding issues. Is stable on Vytorin. On stable dose low-dose metoprolol, not on an ARB or ACE inhibitor.

## 2015-09-17 NOTE — Assessment & Plan Note (Signed)
On current dose of beta blocker doing well. Seems to have resolved at this point. Energy improved with having a reduced dose of beta blocker.

## 2015-11-04 ENCOUNTER — Other Ambulatory Visit: Payer: Self-pay | Admitting: Cardiovascular Disease

## 2015-11-08 ENCOUNTER — Other Ambulatory Visit: Payer: Self-pay | Admitting: Cardiovascular Disease

## 2016-02-21 ENCOUNTER — Encounter: Admission: RE | Payer: Self-pay | Source: Ambulatory Visit

## 2016-02-21 ENCOUNTER — Ambulatory Visit: Admission: RE | Admit: 2016-02-21 | Payer: Medicare Other | Source: Ambulatory Visit | Admitting: Gastroenterology

## 2016-02-21 SURGERY — COLONOSCOPY WITH PROPOFOL
Anesthesia: General

## 2016-06-30 HISTORY — PX: TRANSTHORACIC ECHOCARDIOGRAM: SHX275

## 2016-07-05 ENCOUNTER — Encounter: Payer: Self-pay | Admitting: Cardiology

## 2016-07-05 ENCOUNTER — Ambulatory Visit (INDEPENDENT_AMBULATORY_CARE_PROVIDER_SITE_OTHER): Payer: Medicare Other | Admitting: Cardiology

## 2016-07-05 VITALS — BP 110/66 | HR 66 | Ht 69.0 in | Wt 168.0 lb

## 2016-07-05 DIAGNOSIS — I2510 Atherosclerotic heart disease of native coronary artery without angina pectoris: Secondary | ICD-10-CM | POA: Diagnosis not present

## 2016-07-05 DIAGNOSIS — I350 Nonrheumatic aortic (valve) stenosis: Secondary | ICD-10-CM

## 2016-07-05 DIAGNOSIS — R5383 Other fatigue: Secondary | ICD-10-CM | POA: Diagnosis not present

## 2016-07-05 DIAGNOSIS — I208 Other forms of angina pectoris: Secondary | ICD-10-CM | POA: Diagnosis not present

## 2016-07-05 DIAGNOSIS — I209 Angina pectoris, unspecified: Secondary | ICD-10-CM | POA: Diagnosis not present

## 2016-07-05 DIAGNOSIS — I10 Essential (primary) hypertension: Secondary | ICD-10-CM | POA: Diagnosis not present

## 2016-07-05 DIAGNOSIS — Z9861 Coronary angioplasty status: Secondary | ICD-10-CM

## 2016-07-05 DIAGNOSIS — E785 Hyperlipidemia, unspecified: Secondary | ICD-10-CM

## 2016-07-05 MED ORDER — METOPROLOL TARTRATE 25 MG PO TABS: 12.5000 mg | ORAL_TABLET | Freq: Two times a day (BID) | ORAL | 6 refills | Status: DC

## 2016-07-05 NOTE — Patient Instructions (Addendum)
MEDICATION CHANGE  STOP CLOPIDOGREL  --DECREASE METOPROLOL TARTRATE  12.5 MG TWICE A DAY    SCHEDULE AT [facility] [address] Your physician has requested that you have an echocardiogram. Echocardiography is a painless test that uses sound waves to create images of your heart. It provides your doctor with information about the size and shape of your heart and how well your heart's chambers and valves are working. This procedure takes approximately one hour. There are no restrictions for this procedure.    DEPENDING ON THE ABOVE TEST WILL DECIDE IF MYOVIEW IS NEEDED OR CARDIAC CATHETERIAZATION WILL CONTACT YOU.   LABS CBC THYROID PANEL   Your physician recommends that you schedule a follow-up appointment in [place].

## 2016-07-05 NOTE — Progress Notes (Signed)
PCP: Sofie Hartigan, MD - [facility]  [address]  [place], [place] [address]  [phone]   Clinic Note: Chief Complaint  Patient presents with  . Fatigue    He notes that he is "dragging. Feeling tired" - similar symptoms to before cardiac catheterization-PCI in 2008  . Coronary Artery Disease  . Palpitations    HPI: Victor [name] is a 82 y.o. male with a PMH below who presents today for early annual follow-up for CAD-PCI as well as bradycardia with intermittent palpitations. He has known CAD s/p PCI to RCA in 2008. Echo at that time showed EF >55% with moderate calcification of the aortic valve leaflets.  Follow up echo in 2012 showed mild to moderate aortic stenosis.  He underwent cardiac cath in 2013 in the setting of positive EKG (inferior TWI) with negative nuclear stress test. No ischemic symptoms at that time. Cardiac cath showed widely patient RCA stent with mild progression of proximal RCA disease to moderate 50-60% lesion. There was progression of ostial D3 lesion to 60-70%, not optimal for PCI given the ostial nature and size of the vessel. EF 55-60% with no WMA. It was recommended he continue optimal medical therapy.  Echo & Myoview in 08/2014 - reviewed in [place] - persistent Mod AS. Normal Nuc Echo 08/26/2015: Normal LV size with mild concentric hypertrophy. Normal EF 55-60%. No RWMA. GR 1 DD. Severe aortic stenosis (mean gradient 24 mmHg, AVA 0.75 cm) --> progression from 2016. Mild-moderate MR. Mildly dilated RV. Mildly elevated PA pressures (40 mmHg.  He exercises regularly 3 times weekly, usually without issues. ~1 1/2 hr - wgts, 15 min TM/bike   Victor [name] was last seen on 09/15/2015 - stable. No complaints. We discussed progression of valvular disease from moderate to now severe. We discussed symptoms of concern.  Recent Hospitalizations: None  Studies Personally Reviewed - (if available, images/films reviewed: From Epic Chart or Care  Everywhere)  No new studies  Interval History: "Victor [name]" presents today for an earlier than usual olive visit because he has noted for the last couple months that he is just been getting progressively more weak and tired feeling. He says he is dragging all the time. He is able to still do his exercise, but once he is done he is just worn out. His wife states that he is sleeping all the time when he is not doing his exercise or yard work. He denies any chest tightness or pressure or particular symptoms such as dizziness or lightheadedness. He just feels tired. He may describe some exertional dyspnea, but is mostly after exertion. He is concerned, because the symptoms are very similar to what he felt back in 2008 before his catheterization/PCI at that time. He is not had any more bleeding at that time any chest tightness or pressure with rest or exertion.   No PND, orthopnea or edema. No palpitations, lightheadedness, dizziness, weakness or syncope/near syncope. No TIA/amaurosis fugax symptoms. No melena, hematochezia, hematuria, or epstaxis. No claudication.  ROS: A comprehensive was performed. Pertinent symptoms noted above. Review of Systems  Constitutional: Positive for malaise/fatigue and weight loss. Negative for chills.  HENT: Negative for congestion and nosebleeds.   Respiratory: Negative for cough, shortness of breath and wheezing.   Gastrointestinal: Negative for blood in stool and melena.  Genitourinary: Negative for dysuria, frequency and hematuria.  Musculoskeletal: Positive for joint pain (Normal arthritis pains) and myalgias (His muscles ache, but nothing significant.).  Neurological: Positive for dizziness (  He still has issues with his balance, but not overly worrisome). Negative for loss of consciousness and weakness.  Endo/Heme/Allergies: Negative for environmental allergies. Does not bruise/bleed easily.  Psychiatric/Behavioral: Negative for depression and memory loss. The  patient does not have insomnia.        Excessive sleeping  All other systems reviewed and are negative.  I have reviewed and (if needed) personally updated the patient's problem list, medications, allergies, past medical and surgical history, social and family history.   Past Medical History:  Diagnosis Date  . Angina   . Aortic valve stenosis, moderate 08/2010; 12/05/2012; 08/2014   a. Mild to moderate Aortic Stenosis;; b. Stable in 2014 --> EF 55-60%, Mod AoV Calcification with mild-moderate stenosis; mild MR. (Stable); c.Mod AS (mean Grad 20 mmHg, AVA 0.8), Mod MR.   . Arthritis   . Bilateral carotid artery disease ([place])    CAROTID DOPPLER,05/21/2008 - Right and left ICA-0-49% diameter reduction, left CCA-0-49% diameter reductiion  . BPH (benign prostatic hypertrophy)   . CAD S/P percutaneous coronary angioplasty 07/2006   PCI to RCA - Promus DES 2.5 mm x 23 mm; 2D ECHO - EF >55%, moderate calcification of the aortic valve leaflets  . Diabetes mellitus   . GERD (gastroesophageal reflux disease)   . High cholesterol   . Hypertension    "from the diabetes"    Past Surgical History:  Procedure Laterality Date  . CARDIAC CATHETERIZATION  03/21/2011   Continue to optimize medical therapy  . CARDIAC CATHETERIZATION with PCI  08/27/2006   RCA-mid - 2.5x47mm Promus stent  . CATARACT EXTRACTION W/PHACO Right 03/08/2015   Procedure: CATARACT EXTRACTION PHACO AND INTRAOCULAR LENS PLACEMENT (IOC);  Surgeon: Estill Cotta, MD;  Location: [facility] ORS;  Service: Ophthalmology;  Laterality: Right;  [place]: 01:29.4   . COLECTOMY  ~ 2000  . LEFT HEART CATHETERIZATION WITH CORONARY ANGIOGRAM N/A 03/22/2011   Procedure: LEFT HEART CATHETERIZATION WITH CORONARY ANGIOGRAM;  Surgeon: Leonie Man, MD;  Location: [facility] CATH LAB;  Service: Cardiovascular;  Laterality: N/A;  . NM MYOVIEW LTD  03/09/2011   Post-stress EF 76%, ECG positive for ischemia, normal study  . NM MYOVIEW LTD  08/2014   LOW RISK. NORMAL.  EF  45-54%.   . TRANSTHORACIC ECHOCARDIOGRAM  08/2014   Mod Conc LVH. EF 55-60%. Normal WM. Gr 1 DD.Mod AS & trivial AI (mean Grad 20 mmHg, AVA ~0.8 cm2), Mod MR, Mild LA Dilation, PAP ~ 40 mmHg  . TRANSTHORACIC ECHOCARDIOGRAM  08/26/2015   Normal LV size with mild concentric hypertrophy. Normal EF 55-60%. No RWMA. GR 1 DD. Severe aortic stenosis (mean gradient 24 mmHg, AVA 0.75 cm) --> progression from 2016. Mild-moderate MR. Mildly dilated RV. Mildly elevated PA pressures (40 mmHg.  Marland Kitchen TRANSURETHRAL RESECTION OF PROSTATE  ~ 2010    Current Meds  Medication Sig  . aspirin EC 81 MG tablet Take 81 mg by mouth daily.  . calcium carbonate (OS-CAL) 600 MG TABS tablet Take 600 mg by mouth daily.  . cetirizine (ZYRTEC) 10 MG tablet Take 10 mg by mouth daily.  . Cholecalciferol (VITAMIN D3) 2000 UNITS TABS Take 1 tablet by mouth daily.  . Coenzyme Q10 (CO Q 10 PO) Take by mouth daily.  Marland Kitchen donepezil (ARICEPT) 10 MG tablet Take 10 mg by mouth daily.  Marland Kitchen EPIPEN 2-PAK 0.3 MG/0.3ML SOAJ injection Inject 0.3 mLs as directed as directed.  . ezetimibe-simvastatin (VYTORIN) 10-40 MG per tablet Take 1 tablet by mouth at bedtime.  Marland Kitchen  fexofenadine (ALLEGRA) 180 MG tablet Take 180 mg by mouth daily.  . fluticasone (FLONASE) 50 MCG/ACT nasal spray Place 2 sprays into both nostrils as needed.   . gabapentin (NEURONTIN) 100 MG capsule Take 200 mg by mouth at bedtime.  Marland Kitchen glipiZIDE (GLUCOTROL XL) 2.5 MG 24 hr tablet Take 2.5 mg by mouth daily with breakfast.  . ipratropium (ATROVENT) 0.03 % nasal spray Place 2 sprays into both nostrils 3 (three) times daily as needed for rhinitis.  Marland Kitchen lansoprazole (PREVACID) 30 MG capsule Take 30 mg by mouth daily.  . Magnesium Sulfate 70 MG CAPS Take 70 mg by mouth daily.  . metoprolol tartrate (LOPRESSOR) 25 MG tablet Take 0.5 tablets (12.5 mg total) by mouth 2 (two) times daily.  . Misc Natural Products (OSTEO BI-FLEX ADV JOINT SHIELD PO) Take 1 tablet by mouth daily.  . Multiple  Vitamin (MULITIVITAMIN WITH MINERALS) TABS Take 1 tablet by mouth daily.  Marland Kitchen MYRBETRIQ 50 MG TB24 tablet Take 50 mg by mouth daily.   . Olopatadine HCl 0.2 % SOLN Apply 1 drop to eye daily as needed. Reported on 03/08/2015  . Omega 3 1200 MG CAPS Take 1,200 mg by mouth 2 (two) times daily.  . Saw Palmetto 450 MG CAPS Take 2 capsules by mouth daily.  . Tamsulosin HCl (FLOMAX) 0.4 MG CAPS Take 0.4 mg by mouth daily. Take 30 minutes after same meal each day.  . vitamin B-12 (CYANOCOBALAMIN) 1000 MCG tablet Take 1,000 mcg by mouth daily.  . [DISCONTINUED] clopidogrel (PLAVIX) 75 MG tablet Take 75 mg by mouth daily.  . [DISCONTINUED] metoprolol tartrate (LOPRESSOR) 25 MG tablet Take 12.5 mg by mouth every evening. was taking 12.5mg QAM & 25mg  QHS - changes on 06/08/15 per MD/triage call .Marland KitchenMarland KitchenCurrently is taking 1 tab in the morning and half tab in the evening    Allergies  Allergen Reactions  . Oraxyl [Doxycycline Hyclate] Swelling    "started swelling in my mouth & tongue; had to go to emergency room; really bad reaction"  . Azithromycin Rash  . Cephalosporins Other (See Comments)    unknown  . Pseudoephedrine Other (See Comments)    unknown  . Amoxicillin Rash  . Codeine Itching and Rash  . Levofloxacin Rash  . Sulfa Antibiotics Rash    Social History   Social History  . Marital status: Married    Spouse name: N/A  . Number of children: N/A  . Years of education: N/A   Social History Main Topics  . Smoking status: Never Smoker  . Smokeless tobacco: Former Systems developer    Types: Chew    Quit date: 01/31/1996  . Alcohol use 3.6 oz/week    6 Glasses of wine per week  . Drug use: No  . Sexual activity: Yes   Other Topics Concern  . None   Social History Narrative   Father of 2, grandfather 74.   Exercise for almost 2 hours a day, doing least 20 minutes on the elliptical trainer. He does his to 4 days a week. He'll also does weights and stretching exercises.    Family history is unknown by  patient.  Wt Readings from Last 3 Encounters:  07/05/16 168 lb (76.2 kg)  09/15/15 172 lb 8 oz (78.2 kg)  04/28/15 172 lb 8 oz (78.2 kg)    PHYSICAL EXAM BP 110/66   Pulse 66   Ht 5\' 9"  (1.753 m)   Wt 168 lb (76.2 kg)   BMI 24.81 kg/m  General appearance:  Elderly gentleman well-nourished and well-groomed. Cooperative, appears stated age, no distress. HEENT: /AT, EOMI, MMM, anicteric sclera Neck: no adenopathy, no carotid bruit and no JVD; delayed carotid upstroke Lungs: clear to auscultation bilaterally, normal percussion bilaterally and non-labored Heart: RRR, normal S1 with normal S2. 3/6 harsh mid-to-late peaking SEM at RUSB --> carotids. No R/G. Abdomen: soft, non-tender; bowel sounds normal; no masses,  no organomegaly; no HJR Extremities: extremities normal, atraumatic, no cyanosis, and trace ankle and foot edema  Pulses: 2+ and symmetric;  Skin: mobility and turgor normal, no evidence of bleeding or bruising, no lesions noted, temperature normal and texture normal Neurologic: Mental status: Alert & oriented x 3, thought content appropriate; non-focal exam.  Pleasant mood & affect.   Adult ECG Report  Rate: 66 ;  Rhythm: normal sinus rhythm, sinus arrhythmia and 1 AVB, left axis deviation (-39); otherwise normal intervals and durations. Normal voltage.  Narrative Interpretation: Stable EKG.   Other studies Reviewed: Additional studies/ records that were reviewed today include:  Recent Labs:  From PCP/Care Everywhere 07/03/2016  Total cholesterol 129, TG 126, HDL 52, LDL 52.  Pertinent chemistries: Glucose 125, sodium 140, potassium 4.7. BUN/creatinine 16/0.8. Normal LFTs.  Normal folate and B12   ASSESSMENT / PLAN: Problem List Items Addressed This Visit    Aortic valve stenosis, severe (Chronic)    Severe as by last echo. Now with him having symptoms that are concerning for his prior anginal type symptoms with fatigue and dragging sensation, I am concerned that  he may be showing signs asymptomatic aortic stenosis. His murmur is definitely louder than it was last year.  Plan: Recheck 2-D echocardiogram -> if indeed the values have progressed, we would then need to proceed with surgical evaluation which would include preoperative right and left heart catheterization.      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 The symptoms noted were very similar to what is feeling now. I would like to first confirm or deny any progression of aortic valve disease with an echocardiogram. Then pending the results of the echo would either consider Myoview versus catheterization.  Since he doesn't have true episodes of discomfort, I don't think we need to consider a nitrate. He does not have a lot of blood pressure room. For now simply continue beta blocker.      Relevant Medications   metoprolol tartrate (LOPRESSOR) 25 MG tablet   Other Relevant Orders   EKG 12-Lead (Completed)   ECHOCARDIOGRAM COMPLETE   CBC (Completed)   TSH (Completed)   T4, free (Completed)   T3, free (Completed)   CAD S/P percutaneous coronary angioplasty --> PCI RCA Promus DES 2.5 mm x 23 mm (Chronic)    Status post DES to the RCA in 2008. He has preserved EF with no wall motion abnormalities as of his echocardiogram last year to suggest any ischemia.  Last visit I had stated that it was okay for him to stop his Plavix. He continues to be on aspirin. With borderline pressures, not able to tolerate ACE inhibitor or ARB. He is on beta blocker and statin.  Plan: For now we will continue current medications, but we'll check a 2-D echocardiogram to evaluate his atypical symptoms which are concerning for angina. Pending the results the echocardiogram, would consider progressing with preoperative catheterization if the valve is worse versus Myoview if the valve  is stable.        Relevant Medications   metoprolol tartrate (LOPRESSOR) 25 MG tablet   Other Relevant Orders   EKG 12-Lead (Completed)   ECHOCARDIOGRAM COMPLETE   Essential hypertension (Chronic)    Well-controlled with minimal metoprolol.      Relevant Medications   metoprolol tartrate (LOPRESSOR) 25 MG tablet   Other Relevant Orders   EKG 12-Lead (Completed)   Fatigue    Fatigue was essentially his anginal equivalent in 2008.  He has had B12 and folate levels checked. I did not see a thyroid panel or CBC. We'll then check these levels just to confirm no lab abnormalities.  No new medications taken explain his symptoms. Plan for now is 2-D echocardiogram for now, then possible Myoview versus catheterization pending the results. - Reduce metoprolol dose to 12.5 mg twice a day. He has been taking 12.5 once a day with 25 on the other dose.      Relevant Orders   EKG 12-Lead (Completed)   ECHOCARDIOGRAM COMPLETE   CBC (Completed)   TSH (Completed)   T4, free (Completed)   T3, free (Completed)   Hyperlipidemia with target LDL less than 70 (Chronic)    Excellent labs from earlier this month on Vytorin.      Relevant Medications   metoprolol tartrate (LOPRESSOR) 25 MG tablet      45 minutes spent with the patient. Greater than 50% of the time was spent in direct patient counseling and discussing goals of care and plan of action.  Current medicines are reviewed at length with the patient today. (+/- concerns) None The following changes have been made: None  Patient Instructions  MEDICATION CHANGE  STOP CLOPIDOGREL  --DECREASE METOPROLOL TARTRATE  12.5 MG TWICE A DAY    SCHEDULE AT [place] [facility] Your physician has requested that you have an echocardiogram. Echocardiography is a painless test that uses sound waves to create images of your heart. It provides your doctor with information about the size and shape of your heart and how well your heart's chambers and  valves are working. This procedure takes approximately one hour. There are no restrictions for this procedure.    DEPENDING ON THE ABOVE TEST WILL DECIDE IF MYOVIEW IS NEEDED OR CARDIAC CATHETERIAZATION WILL CONTACT YOU.   LABS CBC THYROID PANEL   Your physician recommends that you schedule a follow-up appointment in [place].    Studies Ordered:   Orders Placed This Encounter  Procedures  . CBC  . TSH  . T4, free  . T3, free  . EKG 12-Lead  . ECHOCARDIOGRAM COMPLETE      [name], M.D., M.S. Interventional Cardiologist   Pager # [phone] Phone # [phone] [address] [place], [place] [address]

## 2016-07-06 LAB — CBC
Hematocrit: 45.4 % (ref 37.5–51.0)
Hemoglobin: 14.8 g/dL (ref 13.0–17.7)
MCH: 30 pg (ref 26.6–33.0)
MCHC: 32.6 g/dL (ref 31.5–35.7)
MCV: 92 fL (ref 79–97)
Platelets: 172 10*3/uL (ref 150–379)
RBC: 4.93 x10E6/uL (ref 4.14–5.80)
RDW: 13.3 % (ref 12.3–15.4)
WBC: 5.8 10*3/uL (ref 3.4–10.8)

## 2016-07-06 LAB — T4, FREE: Free T4: 1.24 ng/dL (ref 0.82–1.77)

## 2016-07-06 LAB — T3, FREE: T3, Free: 3.4 pg/mL (ref 2.0–4.4)

## 2016-07-06 LAB — TSH: TSH: 3.39 u[IU]/mL (ref 0.450–4.500)

## 2016-07-07 ENCOUNTER — Encounter: Payer: Self-pay | Admitting: Cardiology

## 2016-07-07 NOTE — Assessment & Plan Note (Signed)
Excellent labs from earlier this month on Vytorin.

## 2016-07-07 NOTE — Assessment & Plan Note (Signed)
Severe as by last echo. Now with him having symptoms that are concerning for his prior anginal type symptoms with fatigue and dragging sensation, I am concerned that he may be showing signs asymptomatic aortic stenosis. His murmur is definitely louder than it was last year.  Plan: Recheck 2-D echocardiogram -> if indeed the values have progressed, we would then need to proceed with surgical evaluation which would include preoperative right and left heart catheterization.

## 2016-07-07 NOTE — Assessment & Plan Note (Signed)
Status post DES to the RCA in 2008. He has preserved EF with no wall motion abnormalities as of his echocardiogram last year to suggest any ischemia.  Last visit I had stated that it was okay for him to stop his Plavix. He continues to be on aspirin. With borderline pressures, not able to tolerate ACE inhibitor or ARB. He is on beta blocker and statin.  Plan: For now we will continue current medications, but we'll check a 2-D echocardiogram to evaluate his atypical symptoms which are concerning for angina. Pending the results the echocardiogram, would consider progressing with preoperative catheterization if the valve is worse versus Myoview if the valve is stable.

## 2016-07-07 NOTE — Assessment & Plan Note (Signed)
He never had anginal symptoms when he had his RCA lesion. The symptoms noted were very similar to what is feeling now. I would like to first confirm or deny any progression of aortic valve disease with an echocardiogram. Then pending the results of the echo would either consider Myoview versus catheterization.  Since he doesn't have true episodes of discomfort, I don't think we need to consider a nitrate. He does not have a lot of blood pressure room. For now simply continue beta blocker.

## 2016-07-07 NOTE — Assessment & Plan Note (Addendum)
Fatigue was essentially his anginal equivalent in 2008.  He has had B12 and folate levels checked. I did not see a thyroid panel or CBC. We'll then check these levels just to confirm no lab abnormalities.  No new medications taken explain his symptoms. Plan for now is 2-D echocardiogram for now, then possible Myoview versus catheterization pending the results. - Reduce metoprolol dose to 12.5 mg twice a day. He has been taking 12.5 once a day with 25 on the other dose.

## 2016-07-07 NOTE — Assessment & Plan Note (Signed)
Well-controlled with minimal metoprolol.

## 2016-07-19 ENCOUNTER — Other Ambulatory Visit: Payer: Self-pay

## 2016-07-19 ENCOUNTER — Ambulatory Visit (HOSPITAL_COMMUNITY): Payer: Medicare Other | Attending: Internal Medicine

## 2016-07-19 DIAGNOSIS — I081 Rheumatic disorders of both mitral and tricuspid valves: Secondary | ICD-10-CM | POA: Diagnosis not present

## 2016-07-19 DIAGNOSIS — I208 Other forms of angina pectoris: Secondary | ICD-10-CM | POA: Diagnosis not present

## 2016-07-19 DIAGNOSIS — I2510 Atherosclerotic heart disease of native coronary artery without angina pectoris: Secondary | ICD-10-CM | POA: Diagnosis not present

## 2016-07-19 DIAGNOSIS — Z9861 Coronary angioplasty status: Secondary | ICD-10-CM | POA: Diagnosis present

## 2016-07-19 DIAGNOSIS — R5383 Other fatigue: Secondary | ICD-10-CM

## 2016-07-25 ENCOUNTER — Telehealth: Payer: Self-pay | Admitting: *Deleted

## 2016-07-25 NOTE — Telephone Encounter (Signed)
-----   Message from Leonie Man, MD sent at 07/21/2016 11:50 PM EDT ----- Follow-up echocardiogram shows normal pump function with ejection fraction stable at 60-65% and only grade 1 diastolic dysfunction. The aortic valve has progressed from moderate to a moderate-severe stenosis with increased gradients. Still not at severe. Follow-up next year (this may have been more related to inaccurate readings last echo as opposed to progression of disease)   Pls forward to Dr. Thereasa Distance (PCP)

## 2016-07-25 NOTE — Telephone Encounter (Signed)
Spoke to patient. Result given . Verbalized understanding  

## 2016-08-31 ENCOUNTER — Ambulatory Visit (INDEPENDENT_AMBULATORY_CARE_PROVIDER_SITE_OTHER): Payer: Medicare Other | Admitting: Cardiology

## 2016-08-31 ENCOUNTER — Encounter: Payer: Self-pay | Admitting: Cardiology

## 2016-08-31 VITALS — BP 101/67 | HR 78 | Ht 69.0 in | Wt 170.8 lb

## 2016-08-31 DIAGNOSIS — I2510 Atherosclerotic heart disease of native coronary artery without angina pectoris: Secondary | ICD-10-CM | POA: Diagnosis not present

## 2016-08-31 DIAGNOSIS — I209 Angina pectoris, unspecified: Secondary | ICD-10-CM | POA: Diagnosis not present

## 2016-08-31 DIAGNOSIS — R5383 Other fatigue: Secondary | ICD-10-CM | POA: Diagnosis not present

## 2016-08-31 DIAGNOSIS — Z9861 Coronary angioplasty status: Secondary | ICD-10-CM | POA: Diagnosis not present

## 2016-08-31 DIAGNOSIS — I208 Other forms of angina pectoris: Secondary | ICD-10-CM | POA: Diagnosis not present

## 2016-08-31 DIAGNOSIS — I350 Nonrheumatic aortic (valve) stenosis: Secondary | ICD-10-CM | POA: Diagnosis not present

## 2016-08-31 DIAGNOSIS — I10 Essential (primary) hypertension: Secondary | ICD-10-CM

## 2016-08-31 DIAGNOSIS — R001 Bradycardia, unspecified: Secondary | ICD-10-CM

## 2016-08-31 DIAGNOSIS — E785 Hyperlipidemia, unspecified: Secondary | ICD-10-CM | POA: Diagnosis not present

## 2016-08-31 MED ORDER — METOPROLOL SUCCINATE ER 25 MG PO TB24: 25.0000 mg | ORAL_TABLET | Freq: Every day | ORAL | 3 refills | Status: DC

## 2016-08-31 MED ORDER — FUROSEMIDE 20 MG PO TABS: ORAL_TABLET | ORAL | 11 refills | Status: DC

## 2016-08-31 NOTE — Patient Instructions (Signed)
MEDICATION CHANGES  COMPLETED TAKING THE METOPROLOL TARTRATE 12.5 MG. WHEN FINISH THE BOTTLE START TAKING METOPROLOL XL( SUCCINATE) 25 MG ONE TABLET DAILY.      SCHEDULE [place] has requested that you have an echocardiogram in July 2019. Echocardiography is a painless test that uses sound waves to create images of your heart. It provides your doctor with information about the size and shape of your heart and how well your heart's chambers and valves are working. This procedure takes approximately one hour. There are no restrictions for this procedure.    Your physician wants you to follow-up in [place] [facility] You will receive a reminder letter in the mail two months in advance. If you don't receive a letter, please call our office to schedule the follow-up appointment.    If you need a refill on your cardiac medications before your next appointment, please call your pharmacy.

## 2016-08-31 NOTE — Progress Notes (Signed)
PCP: Sofie Hartigan, MD  Clinic Note: Chief Complaint  Patient presents with  . Follow-up    fatigue / sleepiness - /fu Echo. & atypical angina  . Coronary Artery Disease  . Aortic Stenosis    HPI: Victor [name] is a 83 y.o. male with a PMH below who presents today for 2 month follow-up for atypical anginal symptoms. He has both known CAD as well as aortic stenosis.  He has known CAD s/p PCI to RCA in 2008. Echo at that time showed EF >55% with moderate calcification of the aortic valve leaflets.  Echo in 2012: Mild-Mod AS.  CATH 2013 for positive EKG (inferior TWI) with negative nuclear stress test. No ischemic symptoms at that time.  Patent RCA stent w/ progression of pRCA Dz to ~50-60%. Progression of oD3 lesion to 60-70% - not optimal for PCI (b/c ostial).  EF 55-60% - no RWMA. Med Rx.  Echo & Myoview in 08/2014 - reviewed in [place] - persistent Mod AS. Normal Nuc  Echo 08/26/2015: Normal LV size w/ Mild Conc LVH. Nl EF 55-60%. No RWMA. Gr 1 DD. Severe AS (Mean gradient 24 mmHg, AVA 0.75 cm) --> progression from 2016. Mild-mod MR. Mild RV dilation. PAP ~40 mmHg.  Recent Studies -  Personally Reviewed - (if available, images/films reviewed: From Epic Chart or Care Everywhere)  2-D echo 07/19/2016: EF 60-65%. GR 1 DD. Moderate to severe aortic stenosis with a mean gradient 30 mmHg peak gradient of 57 mmHg. Mildly elevated pulmonary pressures.  Victor [name] was last seen on 07/05/2016 for progressive the more notable fatigue and weakness. Sleeping all the time & noting some mild exertional dyspnea.  No real anginal type symptoms of chest tightness or pressure. But to set some dizziness lightheadedness and fatigue. -- > We decided to first check an echocardiogram but also reduced his beta blocker dose. If still symptomatically this time plan was to do a stress test.  Recent Hospitalizations: none  Interval History: "Victor [name]" presents today actually feeling much better. He is almost  back to his baseline. We have reduced his metoprolol dose to 12 twice a day. He is noting better energy levels, less sleepiness and fatigue. He also to be some of this to having stopped his Plavix. He is now back doing his walking and exercise. In fact he is asking to make sure that he is not causing and self harm by doing his exercise. He is not having any PND, orthopnea or edema. No resting or exertional dyspnea or chest pain.   No palpitations, lightheadedness, dizziness, weakness or syncope/near syncope. No TIA/amaurosis fugax symptoms. No melena, hematochezia, hematuria, or epstaxis. No claudication.  ROS: A comprehensive was performed. Review of Systems  Constitutional: Negative for malaise/fatigue (Notably improved).  Musculoskeletal: Positive for joint pain (Still has arthritis pains, but no worse).  Neurological: Positive for dizziness (Still has some balance issues, but much better and much less dizzy). Negative for focal weakness and weakness.  Psychiatric/Behavioral: Negative for memory loss. The patient does not have insomnia.        No longer noting excess sleepiness  All other systems reviewed and are negative.  I have reviewed and (if needed) personally updated the patient's problem list, medications, allergies, past medical and surgical history, social and family history.   Past Medical History:  Diagnosis Date  . Angina   . Aortic valve stenosis, moderate 08/2010; 12/05/2012; 08/2014   a. Mild to moderate Aortic Stenosis;; b. Stable in 2014 -->  EF 55-60%, Mod AoV Calcification with mild-moderate stenosis; mild MR. (Stable); c.Mod AS (mean Grad 20 mmHg, AVA 0.8), Mod MR.   . Arthritis   . Bilateral carotid artery disease ([place])    CAROTID DOPPLER,05/21/2008 - Right and left ICA-0-49% diameter reduction, left CCA-0-49% diameter reductiion  . BPH (benign prostatic hypertrophy)   . CAD S/P percutaneous coronary angioplasty 07/2006   PCI to RCA - Promus DES 2.5 mm x 23 mm; 2D ECHO  - EF >55%, moderate calcification of the aortic valve leaflets  . Diabetes mellitus   . GERD (gastroesophageal reflux disease)   . High cholesterol   . Hypertension    "from the diabetes"    Past Surgical History:  Procedure Laterality Date  . CARDIAC CATHETERIZATION  03/21/2011   Continue to optimize medical therapy  . CARDIAC CATHETERIZATION with PCI  08/27/2006   RCA-mid - 2.5x70mm Promus stent  . CATARACT EXTRACTION W/PHACO Right 03/08/2015   Procedure: CATARACT EXTRACTION PHACO AND INTRAOCULAR LENS PLACEMENT (IOC);  Surgeon: Estill Cotta, MD;  Location: [facility] ORS;  Service: Ophthalmology;  Laterality: Right;  [place]: 01:29.4   . COLECTOMY  ~ 2000  . LEFT HEART CATHETERIZATION WITH CORONARY ANGIOGRAM N/A 03/22/2011   Procedure: LEFT HEART CATHETERIZATION WITH CORONARY ANGIOGRAM;  Surgeon: Leonie Man, MD;  Location: [facility] CATH LAB;  Service: Cardiovascular;  Laterality: N/A;  . NM MYOVIEW LTD  03/09/2011   Post-stress EF 76%, ECG positive for ischemia, normal study  . NM MYOVIEW LTD  08/2014   LOW RISK. NORMAL.  EF 45-54%.   . TRANSTHORACIC ECHOCARDIOGRAM  08/2014   Mod Conc LVH. EF 55-60%. Normal WM. Gr 1 DD.Mod AS & trivial AI (mean Grad 20 mmHg, AVA ~0.8 cm2), Mod MR, Mild LA Dilation, PAP ~ 40 mmHg  . TRANSTHORACIC ECHOCARDIOGRAM  08/26/2015   Normal LV size with mild concentric hypertrophy. Normal EF 55-60%. No RWMA. GR 1 DD. Severe aortic stenosis (mean gradient 24 mmHg, AVA 0.75 cm) --> progression from 2016. Mild-moderate MR. Mildly dilated RV. Mildly elevated PA pressures (40 mmHg.  Marland Kitchen TRANSTHORACIC ECHOCARDIOGRAM  06/2016   EF 60-65%. GR 1 DD. Moderate to severe aortic stenosis with a mean gradient 30 mmHg, peak gradient of 57 mmHg.   Marland Kitchen TRANSURETHRAL RESECTION OF PROSTATE  ~ 2010    Current Meds  Medication Sig  . aspirin EC 81 MG tablet Take 81 mg by mouth daily.  . calcium carbonate (OS-CAL) 600 MG TABS tablet Take 600 mg by mouth daily.  . cetirizine (ZYRTEC) 10 MG  tablet Take 10 mg by mouth daily.  . Cholecalciferol (VITAMIN D3) 2000 UNITS TABS Take 1 tablet by mouth daily.  . Coenzyme Q10 (CO Q 10 PO) Take by mouth daily.  Marland Kitchen donepezil (ARICEPT) 10 MG tablet Take 10 mg by mouth daily.  Marland Kitchen EPIPEN 2-PAK 0.3 MG/0.3ML SOAJ injection Inject 0.3 mLs as directed as directed.  . ezetimibe-simvastatin (VYTORIN) 10-40 MG per tablet Take 1 tablet by mouth at bedtime.  . fexofenadine (ALLEGRA) 180 MG tablet Take 180 mg by mouth daily.  . fluticasone (FLONASE) 50 MCG/ACT nasal spray Place 2 sprays into both nostrils as needed.   . gabapentin (NEURONTIN) 100 MG capsule Take 200 mg by mouth at bedtime.  Marland Kitchen glipiZIDE (GLUCOTROL XL) 2.5 MG 24 hr tablet Take 2.5 mg by mouth daily with breakfast.  . ipratropium (ATROVENT) 0.03 % nasal spray Place 2 sprays into both nostrils 3 (three) times daily as needed for rhinitis.  Marland Kitchen lansoprazole (PREVACID) 30  MG capsule Take 30 mg by mouth daily.  . Magnesium Sulfate 70 MG CAPS Take 70 mg by mouth daily.  . Misc Natural Products (OSTEO BI-FLEX ADV JOINT SHIELD PO) Take 1 tablet by mouth daily.  . Multiple Vitamin (MULITIVITAMIN WITH MINERALS) TABS Take 1 tablet by mouth daily.  Marland Kitchen MYRBETRIQ 50 MG TB24 tablet Take 50 mg by mouth daily.   . Olopatadine HCl 0.2 % SOLN Apply 1 drop to eye daily as needed. Reported on 03/08/2015  . Omega 3 1200 MG CAPS Take 1,200 mg by mouth 2 (two) times daily.  . Saw Palmetto 450 MG CAPS Take 2 capsules by mouth daily.  . Tamsulosin HCl (FLOMAX) 0.4 MG CAPS Take 0.4 mg by mouth daily. Take 30 minutes after same meal each day.  . vitamin B-12 (CYANOCOBALAMIN) 1000 MCG tablet Take 1,000 mcg by mouth daily.  . [DISCONTINUED] metoprolol tartrate (LOPRESSOR) 25 MG tablet Take 0.5 tablets (12.5 mg total) by mouth 2 (two) times daily.    Allergies  Allergen Reactions  . Oraxyl [Doxycycline Hyclate] Swelling    "started swelling in my mouth & tongue; had to go to emergency room; really bad reaction"  .  Azithromycin Rash  . Cephalosporins Other (See Comments)    unknown  . Pseudoephedrine Other (See Comments)    unknown  . Amoxicillin Rash  . Codeine Itching and Rash  . Levofloxacin Rash  . Sulfa Antibiotics Rash    Social History   Social History  . Marital status: Married    Spouse name: N/A  . Number of children: N/A  . Years of education: N/A   Social History Main Topics  . Smoking status: Never Smoker  . Smokeless tobacco: Former Systems developer    Types: Chew    Quit date: 01/31/1996  . Alcohol use 3.6 oz/week    6 Glasses of wine per week  . Drug use: No  . Sexual activity: Yes   Other Topics Concern  . None   Social History Narrative   Father of 2, grandfather 24.   Exercise for almost 2 hours a day, doing least 20 minutes on the elliptical trainer. He does his to 4 days a week. He'll also does weights and stretching exercises.    Family history is unknown by patient.  Wt Readings from Last 3 Encounters:  08/31/16 170 lb 12.8 oz (77.5 kg)  07/05/16 168 lb (76.2 kg)  09/15/15 172 lb 8 oz (78.2 kg)    PHYSICAL EXAM BP 101/67   Pulse 78   Ht 5\' 9"  (1.753 m)   Wt 170 lb 12.8 oz (77.5 kg)   SpO2 94%   BMI 25.22 kg/m  Physical Exam  Constitutional: He is oriented to person, place, and time. He appears well-developed and well-nourished. No distress.  Looks younger than stated age.  HENT:  Head: Normocephalic and atraumatic.  Eyes: Pupils are equal, round, and reactive to light. EOM are normal.  Neck: Normal range of motion. No JVD present.  Cardiovascular: Normal rate, regular rhythm and normal pulses.   Occasional extrasystoles are present.  Murmur heard.  Medium-pitched harsh crescendo-decrescendo mid to late systolic murmur is present with a grade of 3/6  at the upper right sternal border, upper left sternal border radiating to the neck No carotid bruit - radiated AS murmur.. There is delayed carotid upstroke  Pulmonary/Chest: Effort normal and breath sounds  normal. No respiratory distress. He has no wheezes. He has no rales. He exhibits no tenderness.  Abdominal:  Soft. Bowel sounds are normal. He exhibits no distension. There is no tenderness. There is no rebound and no guarding.  No HJR  Musculoskeletal: Normal range of motion. He exhibits no edema or deformity.  Neurological: He is alert and oriented to person, place, and time.  Skin: Skin is warm and dry. No erythema.  Psychiatric: He has a normal mood and affect. His behavior is normal. Thought content normal.    Adult ECG Report n/a  Other studies Reviewed: Additional studies/ records that were reviewed today include:  Recent Labs:  Reviewed in [place] - Total cholesterol 129, TG 126, HDL 52, LDL 52.  ASSESSMENT / PLAN: Problem List Items Addressed This Visit    Aortic valve stenosis, severe - Primary (Chronic)    Interestingly, although his peak gradients and mean gradients for his aortic valve seem to be higher then during last echo, his valve area is estimated to be larger. At this point I think we can still follow-up with annual echoes at this time next year. We discussed symptoms that would be concerning for worsening aortic stenosis. We also discussed going forward what treatment options there are including open AVR versus TAVR. Don't think he is yet the point of being symptomatically not to consider evaluation.      Relevant Medications   metoprolol succinate (TOPROL XL) 25 MG 24 hr tablet   Other Relevant Orders   ECHOCARDIOGRAM COMPLETE   Atypical angina (HCC)    The reassuring that his symptoms improved with simply reducing his beta blocker dose. He is no longer having the exertional fatigue and dizziness/dyspnea. At this point, I think are okay not doing a stress test.  He did show some progression of his aortic stenosis, but don't think the symptoms are related to that at this time because as not to this extent of severity.      Relevant Medications   metoprolol  succinate (TOPROL XL) 25 MG 24 hr tablet   Bradycardia (Chronic)    Notably improved having reduced the dose of beta blocker.      CAD S/P percutaneous coronary angioplasty --> PCI RCA Promus DES 2.5 mm x 23 mm (Chronic)    Assessment PCI to the RCA but has moderate disease in the RCA elsewhere as well as ostial diagonal disease. He really did not have classic anginal symptoms.  It seems that his symptoms of fatigue and dizziness were probably more related to too much beta blocker. Doing better since he reduced the dose. My plan for now will be to convert from metoprolol tartrate 12.5 twice a day to Toprol 25 once a day in order to alleviate the problems with spitting the pill in half. This will also lead to more stable coverage.  He is no longer on Plavix since he is far enough out from his PCI. He is on aspirin alone along with Vytorin With borderline blood pressures, and notable improvement having reduced beta blocker dose, I will not consider restarting ACE inhibitor or ARB.  At this point, I don't perceive a need to do relook catheterization or stress testing until we get to the point where considering preoperative evaluation for his aortic valve disease.      Relevant Medications   metoprolol succinate (TOPROL XL) 25 MG 24 hr tablet   Other Relevant Orders   ECHOCARDIOGRAM COMPLETE   Essential hypertension (Chronic)   Relevant Medications   metoprolol succinate (TOPROL XL) 25 MG 24 hr tablet   Fatigue   Hyperlipidemia with target  LDL less than 70 (Chronic)    Well-controlled on Vytorin.      Relevant Medications   metoprolol succinate (TOPROL XL) 25 MG 24 hr tablet      Current medicines are reviewed at length with the patient today. (+/- concerns) n/a The following changes have been made: n/a  Patient Instructions  MEDICATION CHANGES  COMPLETED TAKING THE METOPROLOL TARTRATE 12.5 MG. WHEN FINISH THE BOTTLE START TAKING METOPROLOL XL( SUCCINATE) 25 MG ONE TABLET  DAILY.      SCHEDULE [facility] has requested that you have an echocardiogram in July 2019. Echocardiography is a painless test that uses sound waves to create images of your heart. It provides your doctor with information about the size and shape of your heart and how well your heart's chambers and valves are working. This procedure takes approximately one hour. There are no restrictions for this procedure.    Your physician wants you to follow-up in [place] [facility] You will receive a reminder letter in the mail two months in advance. If you don't receive a letter, please call our office to schedule the follow-up appointment.    If you need a refill on your cardiac medications before your next appointment, please call your pharmacy.     Studies Ordered:   Orders Placed This Encounter  Procedures  . ECHOCARDIOGRAM COMPLETE      [name], M.D., M.S. Interventional Cardiologist   Pager # [phone] Phone # [phone] [address] [place], [place] [address]

## 2016-09-02 ENCOUNTER — Encounter: Payer: Self-pay | Admitting: Cardiology

## 2016-09-02 NOTE — Assessment & Plan Note (Signed)
Well-controlled on Vytorin.

## 2016-09-02 NOTE — Assessment & Plan Note (Signed)
Notably improved having reduced the dose of beta blocker.

## 2016-09-02 NOTE — Assessment & Plan Note (Signed)
Interestingly, although his peak gradients and mean gradients for his aortic valve seem to be higher then during last echo, his valve area is estimated to be larger. At this point I think we can still follow-up with annual echoes at this time next year. We discussed symptoms that would be concerning for worsening aortic stenosis. We also discussed going forward what treatment options there are including open AVR versus TAVR. Don't think he is yet the point of being symptomatically not to consider evaluation.

## 2016-09-02 NOTE — Assessment & Plan Note (Signed)
The reassuring that his symptoms improved with simply reducing his beta blocker dose. He is no longer having the exertional fatigue and dizziness/dyspnea. At this point, I think are okay not doing a stress test.  He did show some progression of his aortic stenosis, but don't think the symptoms are related to that at this time because as not to this extent of severity.

## 2016-09-02 NOTE — Assessment & Plan Note (Signed)
Assessment PCI to the RCA but has moderate disease in the RCA elsewhere as well as ostial diagonal disease. He really did not have classic anginal symptoms.  It seems that his symptoms of fatigue and dizziness were probably more related to too much beta blocker. Doing better since he reduced the dose. My plan for now will be to convert from metoprolol tartrate 12.5 twice a day to Toprol 25 once a day in order to alleviate the problems with spitting the pill in half. This will also lead to more stable coverage.  He is no longer on Plavix since he is far enough out from his PCI. He is on aspirin alone along with Vytorin With borderline blood pressures, and notable improvement having reduced beta blocker dose, I will not consider restarting ACE inhibitor or ARB.  At this point, I don't perceive a need to do relook catheterization or stress testing until we get to the point where considering preoperative evaluation for his aortic valve disease.

## 2016-09-06 ENCOUNTER — Emergency Department
Admission: EM | Admit: 2016-09-06 | Discharge: 2016-09-06 | Disposition: A | Discharge status: Home / Self Care | Payer: Medicare Other | Attending: Emergency Medicine | Admitting: Emergency Medicine

## 2016-09-06 DIAGNOSIS — I25119 Atherosclerotic heart disease of native coronary artery with unspecified angina pectoris: Secondary | ICD-10-CM | POA: Insufficient documentation

## 2016-09-06 DIAGNOSIS — E119 Type 2 diabetes mellitus without complications: Secondary | ICD-10-CM | POA: Diagnosis not present

## 2016-09-06 DIAGNOSIS — I10 Essential (primary) hypertension: Secondary | ICD-10-CM | POA: Diagnosis not present

## 2016-09-06 DIAGNOSIS — S61211A Laceration without foreign body of left index finger without damage to nail, initial encounter: Secondary | ICD-10-CM | POA: Insufficient documentation

## 2016-09-06 DIAGNOSIS — Z7984 Long term (current) use of oral hypoglycemic drugs: Secondary | ICD-10-CM | POA: Diagnosis not present

## 2016-09-06 DIAGNOSIS — Y93G1 Activity, food preparation and clean up: Secondary | ICD-10-CM | POA: Diagnosis not present

## 2016-09-06 DIAGNOSIS — Z87891 Personal history of nicotine dependence: Secondary | ICD-10-CM | POA: Diagnosis not present

## 2016-09-06 DIAGNOSIS — Z9861 Coronary angioplasty status: Secondary | ICD-10-CM | POA: Insufficient documentation

## 2016-09-06 DIAGNOSIS — Z7982 Long term (current) use of aspirin: Secondary | ICD-10-CM | POA: Insufficient documentation

## 2016-09-06 DIAGNOSIS — Y999 Unspecified external cause status: Secondary | ICD-10-CM | POA: Insufficient documentation

## 2016-09-06 DIAGNOSIS — W260XXA Contact with knife, initial encounter: Secondary | ICD-10-CM | POA: Diagnosis not present

## 2016-09-06 DIAGNOSIS — Y929 Unspecified place or not applicable: Secondary | ICD-10-CM | POA: Insufficient documentation

## 2016-09-06 MED ORDER — TETANUS-DIPHTH-ACELL PERTUSSIS 5-2.5-18.5 LF-MCG/0.5 IM SUSP
0.5000 mL | Freq: Once | INTRAMUSCULAR | Status: AC
  Administered 2016-09-06: 0.5 mL via INTRAMUSCULAR
  Filled 2016-09-06: qty 0.5

## 2016-09-06 NOTE — ED Provider Notes (Signed)
[facility] Emergency Department Provider Note    ____________________________________________   First MD Initiated Contact with Patient 09/06/16 [phone]     (approximate)  I have reviewed the triage vital signs and the nursing notes.   HISTORY  Chief Complaint Laceration    HPI Victor [name] is a 83 y.o. male patient presented with a laceration to the distal left index finger. Patient cut his finger while peeling up this morning. Patient is controlled with direct pressure. Patient denies loss sensation or loss of function of the finger. Patient rates his pain as a 1/10. Patient tetanus shot is not up-to-date.   Past Medical History:  Diagnosis Date  . Angina   . Aortic valve stenosis, moderate 08/2010; 12/05/2012; 08/2014   a. Mild to moderate Aortic Stenosis;; b. Stable in 2014 --> EF 55-60%, Mod AoV Calcification with mild-moderate stenosis; mild MR. (Stable); c.Mod AS (mean Grad 20 mmHg, AVA 0.8), Mod MR.   . Arthritis   . Bilateral carotid artery disease ([place])    CAROTID DOPPLER,05/21/2008 - Right and left ICA-0-49% diameter reduction, left CCA-0-49% diameter reductiion  . BPH (benign prostatic hypertrophy)   . CAD S/P percutaneous coronary angioplasty 07/2006   PCI to RCA - Promus DES 2.5 mm x 23 mm; 2D ECHO - EF >55%, moderate calcification of the aortic valve leaflets  . Diabetes mellitus   . GERD (gastroesophageal reflux disease)   . High cholesterol   . Hypertension    "from the diabetes"    Patient Active Problem List   Diagnosis Date Noted  . Fatigue 07/05/2016  . Atypical angina ([place]) 07/05/2016  . Balance problem 05/02/2015  . Symptomatic PVCs 09/14/2014  . Bradycardia 09/06/2014  . Essential hypertension 03/21/2011  . Hyperlipidemia with target LDL less than 70 03/21/2011  . Diabetes mellitus type II, controlled ([place]) 03/21/2011  . BPH (benign prostatic hyperplasia) 03/21/2011  . Aortic valve stenosis, severe 08/31/2010  . CAD S/P  percutaneous coronary angioplasty --> PCI RCA Promus DES 2.5 mm x 23 mm 07/31/2006    Past Surgical History:  Procedure Laterality Date  . CARDIAC CATHETERIZATION  03/21/2011   Continue to optimize medical therapy  . CARDIAC CATHETERIZATION with PCI  08/27/2006   RCA-mid - 2.5x28mm Promus stent  . CATARACT EXTRACTION W/PHACO Right 03/08/2015   Procedure: CATARACT EXTRACTION PHACO AND INTRAOCULAR LENS PLACEMENT (IOC);  Surgeon: Estill Cotta, MD;  Location: [facility] ORS;  Service: Ophthalmology;  Laterality: Right;  [place]: 01:29.4   . COLECTOMY  ~ 2000  . LEFT HEART CATHETERIZATION WITH CORONARY ANGIOGRAM N/A 03/22/2011   Procedure: LEFT HEART CATHETERIZATION WITH CORONARY ANGIOGRAM;  Surgeon: Leonie Man, MD;  Location: [facility] CATH LAB;  Service: Cardiovascular;  Laterality: N/A;  . NM MYOVIEW LTD  03/09/2011   Post-stress EF 76%, ECG positive for ischemia, normal study  . NM MYOVIEW LTD  08/2014   LOW RISK. NORMAL.  EF 45-54%.   . TRANSTHORACIC ECHOCARDIOGRAM  08/2014   Mod Conc LVH. EF 55-60%. Normal WM. Gr 1 DD.Mod AS & trivial AI (mean Grad 20 mmHg, AVA ~0.8 cm2), Mod MR, Mild LA Dilation, PAP ~ 40 mmHg  . TRANSTHORACIC ECHOCARDIOGRAM  08/26/2015   Normal LV size with mild concentric hypertrophy. Normal EF 55-60%. No RWMA. GR 1 DD. Severe aortic stenosis (mean gradient 24 mmHg, AVA 0.75 cm) --> progression from 2016. Mild-moderate MR. Mildly dilated RV. Mildly elevated PA pressures (40 mmHg.  Marland Kitchen TRANSTHORACIC ECHOCARDIOGRAM  06/2016   EF 60-65%. GR 1  DD. Moderate to severe aortic stenosis with a mean gradient 30 mmHg, peak gradient of 57 mmHg.   Marland Kitchen TRANSURETHRAL RESECTION OF PROSTATE  ~ 2010    Prior to Admission medications   Medication Sig Start Date End Date Taking? Authorizing Provider  aspirin EC 81 MG tablet Take 81 mg by mouth daily.    [provider]  calcium carbonate (OS-CAL) 600 MG TABS tablet Take 600 mg by mouth daily.    [provider]  cetirizine (ZYRTEC) 10  MG tablet Take 10 mg by mouth daily.    [provider]  Cholecalciferol (VITAMIN D3) 2000 UNITS TABS Take 1 tablet by mouth daily.    [provider]  Coenzyme Q10 (CO Q 10 PO) Take by mouth daily.    [provider]  donepezil (ARICEPT) 10 MG tablet Take 10 mg by mouth daily. 11/11/12   [provider]  EPIPEN 2-PAK 0.3 MG/0.3ML SOAJ injection Inject 0.3 mLs as directed as directed. 06/01/14   [provider]  ezetimibe-simvastatin (VYTORIN) 10-40 MG per tablet Take 1 tablet by mouth at bedtime.    [provider]  fexofenadine (ALLEGRA) 180 MG tablet Take 180 mg by mouth daily.    [provider]  fluticasone (FLONASE) 50 MCG/ACT nasal spray Place 2 sprays into both nostrils as needed.     [provider]  gabapentin (NEURONTIN) 100 MG capsule Take 200 mg by mouth at bedtime.    [provider]  glipiZIDE (GLUCOTROL XL) 2.5 MG 24 hr tablet Take 2.5 mg by mouth daily with breakfast.    [provider]  ipratropium (ATROVENT) 0.03 % nasal spray Place 2 sprays into both nostrils 3 (three) times daily as needed for rhinitis.    [provider]  lansoprazole (PREVACID) 30 MG capsule Take 30 mg by mouth daily. 11/04/12   [provider]  Magnesium Sulfate 70 MG CAPS Take 70 mg by mouth daily.    [provider]  metoprolol succinate (TOPROL XL) 25 MG 24 hr tablet Take 1 tablet (25 mg total) by mouth daily. 08/31/16   Leonie Man, MD  Misc Natural Products (OSTEO BI-FLEX ADV JOINT SHIELD PO) Take 1 tablet by mouth daily.    [provider]  Multiple Vitamin (MULITIVITAMIN WITH MINERALS) TABS Take 1 tablet by mouth daily.    [provider]  MYRBETRIQ 50 MG TB24 tablet Take 50 mg by mouth daily.  04/18/15   [provider]  Olopatadine HCl 0.2 % SOLN Apply 1 drop to eye daily as needed. Reported on 03/08/2015 11/11/13   [provider]  Omega 3 1200 MG  CAPS Take 1,200 mg by mouth 2 (two) times daily.    [provider]  Saw Palmetto 450 MG CAPS Take 2 capsules by mouth daily.    [provider]  Tamsulosin HCl (FLOMAX) 0.4 MG CAPS Take 0.4 mg by mouth daily. Take 30 minutes after same meal each day.    [provider]  vitamin B-12 (CYANOCOBALAMIN) 1000 MCG tablet Take 1,000 mcg by mouth daily.    [provider]    Allergies Oraxyl [doxycycline hyclate]; Azithromycin; Cephalosporins; Pseudoephedrine; Amoxicillin; Codeine; Levofloxacin; and Sulfa antibiotics  Family History  Problem Relation Age of Onset  . Family history unknown: Yes    Social History Social History  Substance Use Topics  . Smoking status: Never Smoker  . Smokeless tobacco: Former Systems developer    Types: Chew    Quit date:  01/31/1996  . Alcohol use 3.6 oz/week    6 Glasses of wine per week    Review of Systems  Constitutional: No fever/chills Eyes: No visual changes. ENT: No sore throat. Cardiovascular: Denies chest pain. Respiratory: Denies shortness of breath. Gastrointestinal: No abdominal pain.  No nausea, no vomiting.  No diarrhea.  No constipation. Genitourinary: Negative for dysuria. Musculoskeletal: Negative for back pain. Skin: Negative for rash. Neurological: Negative for headaches, focal weakness or numbness. Endocrine:Diabetes, hyperlipidemia, and hypertension. Hematological/Lymphatic: Allergic/Immunilogical: See medication list  ____________________________________________   PHYSICAL EXAM:  VITAL SIGNS: ED Triage Vitals [09/06/16 0804]  Enc Vitals Group     BP 117/73     Pulse Rate 61     Resp 18     Temp 98.4 F (36.9 C)     Temp Source Oral     SpO2 95 %     Weight 170 lb (77.1 kg)     Height 5\' 9"  (1.753 m)     Head Circumference      Peak Flow      Pain Score 1     Pain Loc      Pain Edu?      Excl. in [place]?     Constitutional: Alert and oriented. Well appearing and in no acute  distress. Cardiovascular: Normal rate, regular rhythm. Grossly normal heart sounds.  Good peripheral circulation. Respiratory: Normal respiratory effort.  No retractions. Lungs CTAB. Neurologic:  Normal speech and language. No gross focal neurologic deficits are appreciated. No gait instability. Skin:  Skin is warm, dry and intact. No rash noted. 0.3 cm laceration distal left index finger. Psychiatric: Mood and affect are normal. Speech and behavior are normal.  ____________________________________________   LABS (all labs ordered are listed, but only abnormal results are displayed)  Labs Reviewed - No data to display ____________________________________________  EKG   ____________________________________________  RADIOLOGY  No results found.  ____________________________________________   PROCEDURES  Procedure(s) performed: LACERATION REPAIR Performed by: Sable Feil Authorized by: Sable Feil Consent: Verbal consent obtained. Risks and benefits: risks, benefits and alternatives were discussed Consent given by: patient Patient identity confirmed: provided demographic data Prepped and Draped in normal sterile fashion Wound explored  Laceration Location: Left index finger  Laceration Length: 0.3cm   Irrigation method: syringe Amount of cleaning: standard  Skin closure: Dermabond  Patient tolerance: Patient tolerated the procedure well with no immediate complications.   Procedures  Critical Care performed: No  ____________________________________________   INITIAL IMPRESSION / ASSESSMENT AND PLAN / ED COURSE  Pertinent labs & imaging results that were available during my care of the patient were reviewed by me and considered in my medical decision making (see chart for details).  Left index finger laceration secondary to a knife cut. Patient was closed using Dermabond. Patient given a tetanus shot. Patient given discharge care instructions. Patient  advised return back to ED if condition worsens.    ____________________________________________   FINAL CLINICAL IMPRESSION(S) / ED DIAGNOSES  Final diagnoses:  Laceration of left index finger without foreign body, nail damage status unspecified, initial encounter      NEW MEDICATIONS STARTED DURING THIS VISIT:  New Prescriptions   No medications on file     Note:  This document was prepared using Dragon voice recognition software and may include unintentional dictation errors.    Sable Feil, PA-C 09/06/16 0825    Earleen Newport, MD 09/06/16 [phone]

## 2016-09-06 NOTE — ED Notes (Signed)
Finger #2 left hand with lac from nail bed to side. Bleeding controlled. Cap refill wnl

## 2016-09-06 NOTE — ED Triage Notes (Signed)
Pt states he was cutting an apple this morning and accidentally cut his left index finger.Marland Kitchen

## 2017-01-11 DIAGNOSIS — M1710 Unilateral primary osteoarthritis, unspecified knee: Secondary | ICD-10-CM | POA: Insufficient documentation

## 2017-01-11 DIAGNOSIS — M19041 Primary osteoarthritis, right hand: Secondary | ICD-10-CM | POA: Insufficient documentation

## 2017-01-11 DIAGNOSIS — M179 Osteoarthritis of knee, unspecified: Secondary | ICD-10-CM | POA: Insufficient documentation

## 2017-04-30 HISTORY — PX: TRANSTHORACIC ECHOCARDIOGRAM: SHX275

## 2017-05-21 ENCOUNTER — Telehealth: Payer: Self-pay | Admitting: Cardiology

## 2017-05-21 NOTE — Telephone Encounter (Signed)
Returned call to patient no answer.LMTC. 

## 2017-05-21 NOTE — Telephone Encounter (Signed)
New message  Pt verbalzied that he is calling for RN  Because he believes he is having issues with his heart valve and that he is extremely   Tired more than normal

## 2017-05-23 NOTE — Telephone Encounter (Signed)
Patient has an appointment  05/24/17

## 2017-05-24 ENCOUNTER — Ambulatory Visit (INDEPENDENT_AMBULATORY_CARE_PROVIDER_SITE_OTHER): Payer: Medicare Other | Admitting: Cardiology

## 2017-05-24 ENCOUNTER — Encounter: Payer: Self-pay | Admitting: Cardiology

## 2017-05-24 VITALS — BP 106/68 | HR 68 | Ht 69.0 in | Wt 168.2 lb

## 2017-05-24 DIAGNOSIS — R5383 Other fatigue: Secondary | ICD-10-CM

## 2017-05-24 DIAGNOSIS — I493 Ventricular premature depolarization: Secondary | ICD-10-CM | POA: Diagnosis not present

## 2017-05-24 DIAGNOSIS — I350 Nonrheumatic aortic (valve) stenosis: Secondary | ICD-10-CM

## 2017-05-24 DIAGNOSIS — Z9861 Coronary angioplasty status: Secondary | ICD-10-CM

## 2017-05-24 DIAGNOSIS — E785 Hyperlipidemia, unspecified: Secondary | ICD-10-CM

## 2017-05-24 DIAGNOSIS — I2510 Atherosclerotic heart disease of native coronary artery without angina pectoris: Secondary | ICD-10-CM

## 2017-05-24 LAB — CBC
Hematocrit: 42 % (ref 37.5–51.0)
Hemoglobin: 14.5 g/dL (ref 13.0–17.7)
MCH: 31.5 pg (ref 26.6–33.0)
MCHC: 34.5 g/dL (ref 31.5–35.7)
MCV: 91 fL (ref 79–97)
Platelets: 166 10*3/uL (ref 150–379)
RBC: 4.6 x10E6/uL (ref 4.14–5.80)
RDW: 13.8 % (ref 12.3–15.4)
WBC: 5.1 10*3/uL (ref 3.4–10.8)

## 2017-05-24 LAB — TSH: TSH: 4.62 u[IU]/mL — ABNORMAL HIGH (ref 0.450–4.500)

## 2017-05-24 NOTE — Patient Instructions (Addendum)
MEDICATION INSTRUCTIONS  STOP TAKING VYTORIN    WEAN OFF  METOPROLOL  FOR ONE WEEK TAKE 1/2 TABLET A DAY  THEN  TAKE 1/2 TABLET EVERY OTHER DAY  FOR 3 DAYS THEN STOP MEDICATION  ( IF PALPATION RETURN RESTART METOPROLOL AT 1/2 TABLET EVERY OTHER DAY.)        LABS  TSH  CBC   WILL MOVE YOUR ECHO DATE TO NEXT WEEK OR THE NEXT.   Your physician recommends that you schedule a follow-up appointment in MAY 23 ,2019 AT 9:20 AM WITH DR HARDING.

## 2017-05-24 NOTE — Progress Notes (Signed)
None  PCP: Sofie Hartigan, MD  Clinic Note: Chief Complaint  Patient presents with  . Follow-up    Fatigue  . Aortic Stenosis  . Coronary Artery Disease    HPI: Victor [name] is a 84 y.o. male with a PMH below who presents today for earlier than anticipated follow-up with complaints of feeling fatigue and weakness.  He has both known CAD as well as severe aortic stenosis --> he is concerned that this may be signs of his valve getting worse. He has known CAD s/p PCI to RCA in 2008. Echo at that time showed EF >55% with moderate calcification of the aortic valve leaflets.  Echo in 2012: Mild-Mod AS.  CATH 2013 for positive EKG (inferior TWI) with negative nuclear stress test. No ischemic symptoms at that time.  Patent RCA stent w/ progression of pRCA Dz to ~50-60%. Progression of oD3 lesion to 60-70% - not optimal for PCI (b/c ostial).  EF 55-60% - no RWMA. Med Rx.  Aortic valve gradient: Peak 22 mmHg, mean 13 mmHg Echo & Myoview in 08/2014 - reviewed in [place] - persistent Mod AS. Normal Nuc  Echo 08/26/2015: Normal LV size w/ Mild Conc LVH. Nl EF 55-60%. No RWMA. Gr 1 DD. Severe AS (Mean gradient 24 mmHg, AVA 0.75 cm) --> progression from 2016. Mild-mod MR. Mild RV dilation. PAP ~40 mmHg.  2-D echo 07/19/2016: EF 60-65%. GR 1 DD. Progression to Moderate to severe aortic stenosis with a mean gradient 30 mmHg peak gradient of 57 mmHg. Mildly elevated pulmonary pressures.   Recent Studies -  Personally Reviewed - (if available, images/films reviewed: From Epic Chart or Care Everywhere)  Due for f/u Echo in July  Murray was last seen on August 31, 2016 in follow-up from her June visit where he was started to feel really weak and fatigued.  He was noticing mostly exertional dyspnea and progressive fatigue.  No angina.  We did a follow-up echocardiogram that did show some progression of his aortic valve stenosis but not to severe.  I reduce his beta-blocker and on return was feeling  back to baseline.  As result, we did not go forward with ischemic evaluation.  Recent Hospitalizations: none  Interval History: "[place]" presents a few months earlier than initially scheduled really feeling the fatigue and dyspnea malaise much more than he had during last visit.  This almost seems to be a seasonal thing with him, but he is noticing a lot of fatigue and sleepiness.  He is sleeping much more and sitting on the house much more and not as active as he usually is.  He has PVCs on exam but really does not feel them they are asymptomatic and has not had any chest tightness or pressure with rest or exertion.  He does not really notice any PND or orthopnea and has not had syncope or near syncope type symptoms but he just has exertional dyspnea.  Mild end of day edema but not significant.  No palpitations, lightheadedness, dizziness, weakness. No TIA/amaurosis fugax symptoms.. No claudication.  ROS: A comprehensive was performed. Review of Systems  Constitutional: Positive for malaise/fatigue (Notably improved). Negative for chills, fever and weight loss.  HENT: Negative for congestion and nosebleeds.   Respiratory: Negative for cough and wheezing.   Gastrointestinal: Negative for abdominal pain, blood in stool and melena.  Genitourinary: Negative for hematuria.  Musculoskeletal: Positive for joint pain (Still has arthritis pains, but no worse).  Neurological: Positive for dizziness (Mostly related  to balance). Negative for focal weakness and weakness.  Psychiatric/Behavioral: Negative for memory loss. The patient does not have insomnia.        No longer noting excess sleepiness  All other systems reviewed and are negative.  I have reviewed and (if needed) personally updated the patient's problem list, medications, allergies, past medical and surgical history, social and family history.   Past Medical History:  Diagnosis Date  . Angina   . Aortic valve stenosis, moderate 08/2014    Progression to moderate-severe stenosis: Echo in June 2018 showed mean gradient 30 mmHg, peak gradient 57 mmHg.  . Arthritis   . Bilateral carotid artery disease ([place])    CAROTID DOPPLER,05/21/2008 - Right and left ICA-0-49% diameter reduction, left CCA-0-49% diameter reductiion  . BPH (benign prostatic hypertrophy)   . CAD S/P percutaneous coronary angioplasty 07/2006   PCI to RCA - Promus DES 2.5 mm x 23 mm; 2D ECHO - EF >55%, moderate calcification of the aortic valve leaflets  . Diabetes mellitus   . GERD (gastroesophageal reflux disease)   . High cholesterol   . Hypertension    "from the diabetes"    Past Surgical History:  Procedure Laterality Date  . CARDIAC CATHETERIZATION with PCI  08/27/2006   RCA-mid - 2.5x68mm Promus stent  . CATARACT EXTRACTION W/PHACO Right 03/08/2015   Procedure: CATARACT EXTRACTION PHACO AND INTRAOCULAR LENS PLACEMENT (IOC);  Surgeon: Estill Cotta, MD;  Location: [facility] ORS;  Service: Ophthalmology;  Laterality: Right;  [place]: 01:29.4   . COLECTOMY  ~ 2000  . LEFT HEART CATHETERIZATION WITH CORONARY ANGIOGRAM N/A 03/22/2011   Procedure: LEFT HEART CATHETERIZATION WITH CORONARY ANGIOGRAM;  Surgeon: Leonie Man, MD;  Location: [facility] CATH LAB;  Service: Cardiovascular::: Patent RCA stent w/ progression of pRCA Dz to ~50-60%. Progression of oD3 lesion to 60-70% - not optimal for PCI (b/c ostial).  EF 55-60% - no RWMA. Med Rx.  Aortic valve gradient: Peak 22 mmHg, mean 13 mmHg  . NM MYOVIEW LTD  03/09/2011   Post-stress EF 76%, ECG positive for ischemia, normal study  . NM MYOVIEW LTD  08/2014   LOW RISK. NORMAL.  EF 45-54%.   . TRANSTHORACIC ECHOCARDIOGRAM  08/2014   Mod Conc LVH. EF 55-60%. Normal WM. Gr 1 DD.Mod AS & trivial AI (mean Grad 20 mmHg, AVA ~0.8 cm2), Mod MR, Mild LA Dilation, PAP ~ 40 mmHg  . TRANSTHORACIC ECHOCARDIOGRAM  08/26/2015   Normal LV size with mild concentric hypertrophy. Normal EF 55-60%. No RWMA. GR 1 DD. Severe aortic stenosis (mean  gradient 24 mmHg, AVA 0.75 cm) --> progression from 2016. Mild-moderate MR. Mildly dilated RV. Mildly elevated PA pressures (40 mmHg.  Marland Kitchen TRANSTHORACIC ECHOCARDIOGRAM  06/2016   EF 60-65%. GR 1 DD. Moderate to severe aortic stenosis with a mean gradient 30 mmHg, peak gradient of 57 mmHg.   Marland Kitchen TRANSURETHRAL RESECTION OF PROSTATE  ~ 2010    Current Meds  Medication Sig  . aspirin EC 81 MG tablet Take 81 mg by mouth daily.  . calcium carbonate (OS-CAL) 600 MG TABS tablet Take 600 mg by mouth daily.  . cetirizine (ZYRTEC) 10 MG tablet Take 10 mg by mouth daily.  . Cholecalciferol (VITAMIN D3) 2000 UNITS TABS Take 1 tablet by mouth daily.  . Coenzyme Q10 (CO Q 10 PO) Take by mouth daily.  Marland Kitchen donepezil (ARICEPT) 10 MG tablet Take 10 mg by mouth daily.  Marland Kitchen EPIPEN 2-PAK 0.3 MG/0.3ML SOAJ injection Inject 0.3 mLs as directed as  directed.  . fluticasone (FLONASE) 50 MCG/ACT nasal spray Place 2 sprays into both nostrils as needed.   . gabapentin (NEURONTIN) 100 MG capsule Take 200 mg by mouth at bedtime.  Marland Kitchen glipiZIDE (GLUCOTROL XL) 2.5 MG 24 hr tablet Take 2.5 mg by mouth daily with breakfast.  . metoprolol succinate (TOPROL XL) 25 MG 24 hr tablet Take 1 tablet (25 mg total) by mouth daily.  . Misc Natural Products (OSTEO BI-FLEX ADV JOINT SHIELD PO) Take 1 tablet by mouth daily.  . Multiple Vitamin (MULITIVITAMIN WITH MINERALS) TABS Take 1 tablet by mouth daily.  Marland Kitchen MYRBETRIQ 50 MG TB24 tablet Take 50 mg by mouth daily.   . Omega 3 1200 MG CAPS Take 1,200 mg by mouth 2 (two) times daily.  . Saw Palmetto 450 MG CAPS Take 2 capsules by mouth daily.  . Tamsulosin HCl (FLOMAX) 0.4 MG CAPS Take 0.4 mg by mouth daily. Take 30 minutes after same meal each day.  . vitamin B-12 (CYANOCOBALAMIN) 1000 MCG tablet Take 1,000 mcg by mouth daily.  . [DISCONTINUED] ezetimibe-simvastatin (VYTORIN) 10-40 MG per tablet Take 1 tablet by mouth at bedtime.    Allergies  Allergen Reactions  . Azithromycin Rash and  Anaphylaxis  . Cephalosporins Other (See Comments) and Anaphylaxis    unknown  . Codeine Itching, Rash and Anaphylaxis    Other reaction(s): Unknown  . Doxycycline Anaphylaxis  . Oraxyl [Doxycycline Hyclate] Swelling    "started swelling in my mouth & tongue; had to go to emergency room; really bad reaction"  . Doxycycline Hyclate     Other reaction(s): Unknown  . Phenylephrine-Guaifenesin     Other reaction(s): Other (See Comments)  . Pseudoephedrine Other (See Comments)    unknown Other reaction(s): Unknown Other reaction(s): Unknown Other reaction(s): Unknown   . Amoxicillin Rash  . Levofloxacin Rash  . Levofloxacin Rash  . Penicillins Rash    Other reaction(s): Unknown  . Sulfa Antibiotics Rash  . Sulfasalazine Rash    Social History   Tobacco Use  . Smoking status: Never Smoker  . Smokeless tobacco: Former Systems developer    Types: Chew  Substance Use Topics  . Alcohol use: Yes    Alcohol/week: 3.6 oz    Types: 6 Glasses of wine per week  . Drug use: No   Social History   Social History Narrative   Father of 2, grandfather 8.   Exercise for almost 2 hours a day, doing least 20 minutes on the elliptical trainer. He does his to 4 days a week. He'll also does weights and stretching exercises.    Family history is unknown by patient.  Given his age and comorbidities, noncontributory.  Wt Readings from Last 3 Encounters:  05/24/17 168 lb 3.2 oz (76.3 kg)  09/06/16 170 lb (77.1 kg)  08/31/16 170 lb 12.8 oz (77.5 kg)    PHYSICAL EXAM BP 106/68   Pulse 68   Ht 5\' 9"  (1.753 m)   Wt 168 lb 3.2 oz (76.3 kg)   BMI 24.84 kg/m  Physical Exam  Constitutional: He is oriented to person, place, and time. He appears well-developed and well-nourished. No distress.  Looks younger than stated age.  HENT:  Head: Normocephalic and atraumatic.  Neck: Normal range of motion. Neck supple. Decreased carotid pulses (Delayed upstroke) present. No JVD present. Carotid bruit is not  present (Radiated aortic murmur).  Cardiovascular: Normal rate, regular rhythm, S1 normal, S2 normal and normal pulses.  Occasional extrasystoles are present. PMI is not displaced.  Exam reveals no gallop and no friction rub.  Murmur heard.  Medium-pitched harsh crescendo-decrescendo mid to late systolic murmur is present with a grade of 3/6 at the upper right sternal border and upper left sternal border radiating to the neck. No carotid bruit - radiated AS murmur -with  delayed carotid upstroke  Pulmonary/Chest: Effort normal and breath sounds normal. No respiratory distress. He has no wheezes. He has no rales. He exhibits no tenderness.  Abdominal: Soft. Bowel sounds are normal. He exhibits no distension. There is no tenderness. There is no rebound and no guarding.  No HJR  Musculoskeletal: Normal range of motion. He exhibits no edema or deformity.  Neurological: He is alert and oriented to person, place, and time.  Skin: Skin is warm and dry. No erythema.  Psychiatric: He has a normal mood and affect. His behavior is normal. Thought content normal.    Adult ECG Report  Rate:68 ;  Rhythm: normal sinus rhythm and 1 degree AV block (212).  Left axis deviation of (-36).  Nonspecific ST and T wave changes.  Otherwise normal intervals and durations.;   Narrative Interpretation: First-degree AV block noted.  Otherwise stable   Other studies Reviewed: Additional studies/ records that were reviewed today include:  Recent Labs:  Reviewed in [place] -February 19, 2017: Total cholesterol 144, TG 96, HDL 52, LDL 73.  BUN 16, creatinine 0.9.  Potassium 4.9.  Normal bilirubin and LFTs.  ASSESSMENT / PLAN: Problem List Items Addressed This Visit    Symptomatic PVCs    No longer symptomatic.  He had several PVCs while I was listening to him on exam and he did not feel any of them.  At this point I think we are safe weaning off his beta-blocker to see if he actually notices.  We may need to have a  happy medium of 12.5 mg daily.  He certainly felt better when I backed him down and twisted Toprol a year ago.      Relevant Orders   EKG 12-Lead (Completed)   TSH (Completed)   CBC (Completed)   Hyperlipidemia with target LDL less than 70 (Chronic)    Last labs are not as well controlled as it had been.  However, I think we can probably have him take a statin holiday to see if that helps his fatigue.      Fatigue due to treatment    Very interesting that he continues to have this fatigue.  Almost appears to be cyclical in nature. He is worried about it being related to his aortic stenosis, but this was also his angina equivalent in the past. He has had a history of anemia so we can check a CBC and will also check a TSH. For now we will simply move up his echocardiogram from July until now and reassess.     I will also have him stop any potential offending agents including his low-dose residual metoprolol which she will wean off and we will also have him stop Vytorin.  I will see him back in a about a month in order to have time for medication to wear off and to review his echocardiogram.  At that time we can decide whether or not we just move forward with preop right and left heart cath versus stress test.      Relevant Orders   TSH (Completed)   CBC (Completed)   CAD S/P percutaneous coronary angioplasty --> PCI RCA Promus DES 2.5 mm x 23 mm (Chronic)  No longer on Plavix to wean off his beta-blocker because of fatigue.  He has not had any angina but never really did type symptoms.  After discussion with him he is concerned about his aortic valve and I think it may not be an unreasonable discussion to potentially move forward with preop cardiac catheterization and possibly TEE.  I will see how he feels at her back no home medications first.      Relevant Orders   EKG 12-Lead (Completed)   TSH (Completed)   CBC (Completed)   Aortic valve stenosis, severe - Primary (Chronic)     Please probably nearing the stage of his aortic valve stenosis where he may be coming close to needing AVR.  At this point I would not try first thing of any offensive agents but will recheck his echocardiogram.  If it does seem to have progressed and he still feeling bad in follow-up I think we may likely proceed with his preop right and left heart cath and referral to the valve clinic.      Relevant Orders   EKG 12-Lead (Completed)   CBC (Completed)      Current medicines are reviewed at length with the patient today. (+/- concerns) n/a The following changes have been made: n/a  Patient Instructions  MEDICATION INSTRUCTIONS  STOP TAKING VYTORIN    WEAN OFF  METOPROLOL  FOR ONE WEEK TAKE 1/2 TABLET A DAY  THEN  TAKE 1/2 TABLET EVERY OTHER DAY  FOR 3 DAYS THEN STOP MEDICATION  ( IF PALPATION RETURN RESTART METOPROLOL AT 1/2 TABLET EVERY OTHER DAY.)        LABS  TSH  CBC   WILL MOVE YOUR ECHO DATE TO NEXT WEEK OR THE NEXT.   Your physician recommends that you schedule a follow-up appointment in MAY 23 ,2019 AT 9:20 AM WITH DR Nairobi Gustafson.      Studies Ordered:   Orders Placed This Encounter  Procedures  . TSH  . CBC  . EKG 12-Lead      [name], M.D., M.S. Interventional Cardiologist   Pager # [phone] Phone # [phone] [address] [place], [place] [address]

## 2017-05-25 ENCOUNTER — Telehealth: Payer: Self-pay | Admitting: *Deleted

## 2017-05-25 NOTE — Telephone Encounter (Signed)
-----   Message from Leonie Man, MD sent at 05/24/2017  5:07 PM EDT ----- Thyroid levels are just a little bit off.  Not enough to explain symptoms however.  We can check more detailed thyroid levels when I see him back.  No signs of anemia.  [name], MD  pls fwd to PCP: Sofie Hartigan, MD

## 2017-05-25 NOTE — Telephone Encounter (Signed)
Spoke to patient. Result given . Verbalized understanding Routed results  DR [facility]

## 2017-05-26 ENCOUNTER — Encounter: Payer: Self-pay | Admitting: Cardiology

## 2017-05-26 NOTE — Assessment & Plan Note (Signed)
Very interesting that he continues to have this fatigue.  Almost appears to be cyclical in nature. He is worried about it being related to his aortic stenosis, but this was also his angina equivalent in the past. He has had a history of anemia so we can check a CBC and will also check a TSH. For now we will simply move up his echocardiogram from July until now and reassess.     I will also have him stop any potential offending agents including his low-dose residual metoprolol which she will wean off and we will also have him stop Vytorin.  I will see him back in a about a month in order to have time for medication to wear off and to review his echocardiogram.  At that time we can decide whether or not we just move forward with preop right and left heart cath versus stress test.

## 2017-05-26 NOTE — Assessment & Plan Note (Signed)
No longer on Plavix to wean off his beta-blocker because of fatigue.  He has not had any angina but never really did type symptoms.  After discussion with him he is concerned about his aortic valve and I think it may not be an unreasonable discussion to potentially move forward with preop cardiac catheterization and possibly TEE.  I will see how he feels at her back no home medications first.

## 2017-05-26 NOTE — Assessment & Plan Note (Signed)
Last labs are not as well controlled as it had been.  However, I think we can probably have him take a statin holiday to see if that helps his fatigue.

## 2017-05-26 NOTE — Assessment & Plan Note (Signed)
No longer symptomatic.  He had several PVCs while I was listening to him on exam and he did not feel any of them.  At this point I think we are safe weaning off his beta-blocker to see if he actually notices.  We may need to have a happy medium of 12.5 mg daily.  He certainly felt better when I backed him down and twisted Toprol a year ago.

## 2017-05-26 NOTE — Assessment & Plan Note (Signed)
Please probably nearing the stage of his aortic valve stenosis where he may be coming close to needing AVR.  At this point I would not try first thing of any offensive agents but will recheck his echocardiogram.  If it does seem to have progressed and he still feeling bad in follow-up I think we may likely proceed with his preop right and left heart cath and referral to the valve clinic.

## 2017-05-29 ENCOUNTER — Ambulatory Visit (HOSPITAL_COMMUNITY): Payer: Medicare Other | Attending: Cardiovascular Disease

## 2017-05-29 ENCOUNTER — Other Ambulatory Visit: Payer: Self-pay

## 2017-05-29 DIAGNOSIS — I083 Combined rheumatic disorders of mitral, aortic and tricuspid valves: Secondary | ICD-10-CM | POA: Diagnosis not present

## 2017-05-29 DIAGNOSIS — Z9861 Coronary angioplasty status: Secondary | ICD-10-CM | POA: Diagnosis present

## 2017-05-29 DIAGNOSIS — I350 Nonrheumatic aortic (valve) stenosis: Secondary | ICD-10-CM

## 2017-05-29 DIAGNOSIS — I2510 Atherosclerotic heart disease of native coronary artery without angina pectoris: Secondary | ICD-10-CM | POA: Insufficient documentation

## 2017-06-21 ENCOUNTER — Ambulatory Visit (INDEPENDENT_AMBULATORY_CARE_PROVIDER_SITE_OTHER): Payer: Medicare Other | Admitting: Cardiology

## 2017-06-21 ENCOUNTER — Encounter: Payer: Self-pay | Admitting: Cardiology

## 2017-06-21 VITALS — BP 113/71 | HR 79 | Ht 69.5 in | Wt 167.4 lb

## 2017-06-21 DIAGNOSIS — I208 Other forms of angina pectoris: Secondary | ICD-10-CM | POA: Diagnosis not present

## 2017-06-21 DIAGNOSIS — E559 Vitamin D deficiency, unspecified: Secondary | ICD-10-CM

## 2017-06-21 DIAGNOSIS — I10 Essential (primary) hypertension: Secondary | ICD-10-CM | POA: Diagnosis not present

## 2017-06-21 DIAGNOSIS — I2510 Atherosclerotic heart disease of native coronary artery without angina pectoris: Secondary | ICD-10-CM | POA: Diagnosis not present

## 2017-06-21 DIAGNOSIS — I350 Nonrheumatic aortic (valve) stenosis: Secondary | ICD-10-CM

## 2017-06-21 DIAGNOSIS — Z9861 Coronary angioplasty status: Secondary | ICD-10-CM | POA: Diagnosis not present

## 2017-06-21 DIAGNOSIS — E785 Hyperlipidemia, unspecified: Secondary | ICD-10-CM | POA: Diagnosis not present

## 2017-06-21 DIAGNOSIS — R5383 Other fatigue: Secondary | ICD-10-CM

## 2017-06-21 DIAGNOSIS — I493 Ventricular premature depolarization: Secondary | ICD-10-CM

## 2017-06-21 MED ORDER — EZETIMIBE-SIMVASTATIN 10-20 MG PO TABS: 1.0000 | ORAL_TABLET | Freq: Every day | ORAL | 3 refills | Status: DC

## 2017-06-21 NOTE — Patient Instructions (Addendum)
MEDICATION INSTRUCTIONS   RESTART GENERIC VYTORIN 10/20 MG TAKE AT BEDTIME    LABS FREE T4  T3 VIT D     DISCUSS WITH PRIMARY ABOUT YOUR SYMPTOMS    Your physician wants you to follow-up in [place] [address] [facility].You will receive a reminder letter in the mail two months in advance. If you don't receive a letter, please call our office to schedule the follow-up appointment.    If you need a refill on your cardiac medications before your next appointment, please call your pharmacy.

## 2017-06-21 NOTE — Progress Notes (Signed)
None  PCP: Sofie Hartigan, MD  Clinic Note: Chief Complaint  Patient presents with  . Follow-up    Results of echocardiogram  . Fatigue  . Coronary Artery Disease  . Cardiac Valve Problem    Moderate aortic stenosis.  Stable    HPI: Victor [name] is a 84 y.o. male with a PMH below who presents today for earlier than anticipated follow-up with complaints of feeling fatigue and weakness.  He has both known CAD as well as severe aortic stenosis --> he is concerned that this may be signs of his valve getting worse. He has known CAD s/p PCI to RCA in 2008. Echo at that time showed EF >55% with moderate calcification of the aortic valve leaflets.  Echo in 2012: Mild-Mod AS.  CATH 2013 for positive EKG (inferior TWI) with negative nuclear stress test. No ischemic symptoms at that time.  Patent RCA stent w/ progression of pRCA Dz to ~50-60%. Progression of oD3 lesion to 60-70% - not optimal for PCI (b/c ostial).  EF 55-60% - no RWMA. Med Rx.  Aortic valve gradient: Peak 22 mmHg, mean 13 mmHg Echo & Myoview in 08/2014 - reviewed in [place] - persistent Mod AS. Normal Nuc  Echo 08/26/2015: Normal LV size w/ Mild Conc LVH. Nl EF 55-60%. No RWMA. Gr 1 DD. Severe AS (Mean gradient 24 mmHg, AVA 0.75 cm) --> progression from 2016. Mild-mod MR. Mild RV dilation. PAP ~40 mmHg.  2-D echo 07/19/2016: EF 60-65%. GR 1 DD. Progression to Moderate to severe aortic stenosis with a mean gradient 30 mmHg peak gradient of 57 mmHg. Mildly elevated pulmonary pressures.    [name] was seen back in April with concerns of fatigue and dyspnea/malaise that was worse from previous visit.  Noting sleepiness and fatigue.  No chest pain or pressure.  No PND, orthopnea or more than minimal end of day edema.  No syncope or near syncope.  He was concerned that his valve was worse, therefore we checked his echocardiogram early.  I also weaned off his beta-blocker & stopped statin.   Recent Studies -  Personally Reviewed -  (if available, images/films reviewed: From Epic Chart or Care Everywhere)  2 D Echo 05/29/17: Normal LV size and function.  EF 60-65%.  GR 1 DD.  Moderate aortic stenosis with estimated valve area between 0.88-0.97 cm.  (Mean gradient 25 mmHg, peak gradient 43 mmHg)  Recent Hospitalizations: none  Interval History: "[place]" presents for follow-up today still noticing that his energy level is down pain is somewhat fatigued.  He seems almost upset that his echocardiogram did not show any progression of disease.  He still does his routine exercise routine, but when he gets home, he does has to sit down and rest and fall asleep part of the day.  This is not common for him according to his wife.  (On reviewing my clinic notes, this seems to be a seasonal thing for him.  He denies any chest tightness pressure or pressure while he is doing his exercise. He denies any PND, orthopnea with only minimal end of day edema.  No rapid irregular heartbeat/palpitations.  No syncope/near syncope or TIA symptoms.  Seems indicated that he really did not notice feeling any better having stopped his statin, or having wean off the beta-blocker.  ROS: A comprehensive was performed. Review of Systems  Constitutional: Positive for malaise/fatigue (Notably improved). Negative for chills, fever and weight loss.  HENT: Negative for congestion and nosebleeds.  Respiratory: Negative for cough and wheezing.   Cardiovascular: Negative for claudication.  Gastrointestinal: Negative for abdominal pain, blood in stool and melena.  Genitourinary: Negative for hematuria.  Musculoskeletal: Positive for joint pain (Still has arthritis pains, but no worse).  Neurological: Positive for dizziness (Poor balance). Negative for focal weakness and weakness.  Psychiatric/Behavioral: Negative for memory loss. The patient does not have insomnia.        No longer noting excess sleepiness  All other systems reviewed and are negative.  I  have reviewed and (if needed) personally updated the patient's problem list, medications, allergies, past medical and surgical history, social and family history.   Past Medical History:  Diagnosis Date  . Angina   . Aortic valve stenosis, moderate 08/2014   Progression to moderate-severe stenosis: Echo in June 2018 showed mean gradient 30 mmHg, peak gradient 57 mmHg.  . Arthritis   . Bilateral carotid artery disease ([place])    CAROTID DOPPLER,05/21/2008 - Right and left ICA-0-49% diameter reduction, left CCA-0-49% diameter reductiion  . BPH (benign prostatic hypertrophy)   . CAD S/P percutaneous coronary angioplasty 07/2006   PCI to RCA - Promus DES 2.5 mm x 23 mm; 2D ECHO - EF >55%, moderate calcification of the aortic valve leaflets  . Diabetes mellitus   . GERD (gastroesophageal reflux disease)   . High cholesterol   . Hypertension    "from the diabetes"    Past Surgical History:  Procedure Laterality Date  . CARDIAC CATHETERIZATION with PCI  08/27/2006   RCA-mid - 2.5x35mm Promus stent  . CATARACT EXTRACTION W/PHACO Right 03/08/2015   Procedure: CATARACT EXTRACTION PHACO AND INTRAOCULAR LENS PLACEMENT (IOC);  Surgeon: Estill Cotta, MD;  Location: [facility] ORS;  Service: Ophthalmology;  Laterality: Right;  [place]: 01:29.4   . COLECTOMY  ~ 2000  . LEFT HEART CATHETERIZATION WITH CORONARY ANGIOGRAM N/A 03/22/2011   Procedure: LEFT HEART CATHETERIZATION WITH CORONARY ANGIOGRAM;  Surgeon: Leonie Man, MD;  Location: [facility] CATH LAB;  Service: Cardiovascular::: Patent RCA stent w/ progression of pRCA Dz to ~50-60%. Progression of oD3 lesion to 60-70% - not optimal for PCI (b/c ostial).  EF 55-60% - no RWMA. Med Rx.  Aortic valve gradient: Peak 22 mmHg, mean 13 mmHg  . NM MYOVIEW LTD  08/2014   LOW RISK. NORMAL.  EF 45-54%.   . TRANSTHORACIC ECHOCARDIOGRAM  04/2017   Normal LV size and function.  EF 60-65%.  GR 1 DD.  Moderate aortic stenosis with estimated valve area between 0.88-0.97 cm.  (Mean  gradient 25 mmHg, peak gradient 43 mmHg)  . TRANSTHORACIC ECHOCARDIOGRAM  08/26/2015   Normal LV size with mild concentric hypertrophy. Normal EF 55-60%. No RWMA. GR 1 DD. Severe aortic stenosis (mean gradient 24 mmHg, AVA 0.75 cm) --> progression from 2016. Mild-moderate MR. Mildly dilated RV. Mildly elevated PA pressures (40 mmHg.  Marland Kitchen TRANSTHORACIC ECHOCARDIOGRAM  06/2016   EF 60-65%. GR 1 DD. Moderate to severe aortic stenosis with a mean gradient 30 mmHg, peak gradient of 57 mmHg.   Marland Kitchen TRANSURETHRAL RESECTION OF PROSTATE  ~ 2010    Current Meds  Medication Sig  . aspirin EC 81 MG tablet Take 81 mg by mouth daily.  . calcium carbonate (OS-CAL) 600 MG TABS tablet Take 600 mg by mouth daily.  . cetirizine (ZYRTEC) 10 MG tablet Take 10 mg by mouth daily.  . Cholecalciferol (VITAMIN D3) 2000 UNITS TABS Take 1 tablet by mouth daily.  . Coenzyme Q10 (CO Q 10 PO) Take  by mouth daily.  Marland Kitchen donepezil (ARICEPT) 10 MG tablet Take 10 mg by mouth daily.  Marland Kitchen EPIPEN 2-PAK 0.3 MG/0.3ML SOAJ injection Inject 0.3 mLs as directed as directed.  . fluticasone (FLONASE) 50 MCG/ACT nasal spray Place 2 sprays into both nostrils as needed.   . gabapentin (NEURONTIN) 100 MG capsule Take 200 mg by mouth at bedtime.  Marland Kitchen glipiZIDE (GLUCOTROL XL) 2.5 MG 24 hr tablet Take 2.5 mg by mouth daily with breakfast.  . metoprolol succinate (TOPROL XL) 25 MG 24 hr tablet Take 1 tablet (25 mg total) by mouth daily.  . Misc Natural Products (OSTEO BI-FLEX ADV JOINT SHIELD PO) Take 1 tablet by mouth daily.  . Multiple Vitamin (MULITIVITAMIN WITH MINERALS) TABS Take 1 tablet by mouth daily.  Marland Kitchen MYRBETRIQ 50 MG TB24 tablet Take 50 mg by mouth daily.   . Omega 3 1200 MG CAPS Take 1,200 mg by mouth 2 (two) times daily.  . Saw Palmetto 450 MG CAPS Take 2 capsules by mouth daily.  . Tamsulosin HCl (FLOMAX) 0.4 MG CAPS Take 0.4 mg by mouth daily. Take 30 minutes after same meal each day.  . vitamin B-12 (CYANOCOBALAMIN) 1000 MCG tablet Take  1,000 mcg by mouth daily.   --No longer taking Toprol.  Allergies  Allergen Reactions  . Azithromycin Rash and Anaphylaxis  . Cephalosporins Other (See Comments) and Anaphylaxis    unknown  . Codeine Itching, Rash and Anaphylaxis    Other reaction(s): Unknown  . Doxycycline Anaphylaxis  . Oraxyl [Doxycycline Hyclate] Swelling    "started swelling in my mouth & tongue; had to go to emergency room; really bad reaction"  . Doxycycline Hyclate     Other reaction(s): Unknown  . Phenylephrine-Guaifenesin     Other reaction(s): Other (See Comments)  . Pseudoephedrine Other (See Comments)    unknown Other reaction(s): Unknown Other reaction(s): Unknown Other reaction(s): Unknown   . Amoxicillin Rash  . Levofloxacin Rash  . Levofloxacin Rash  . Penicillins Rash    Other reaction(s): Unknown  . Sulfa Antibiotics Rash  . Sulfasalazine Rash    Social History   Tobacco Use  . Smoking status: Never Smoker  . Smokeless tobacco: Former Systems developer    Types: Chew  Substance Use Topics  . Alcohol use: Yes    Alcohol/week: 3.6 oz    Types: 6 Glasses of wine per week  . Drug use: No   Social History   Social History Narrative   Father of 2, grandfather 45.   Exercise for almost 2 hours a day, doing least 20 minutes on the elliptical trainer. He does his to 4 days a week. He'll also does weights and stretching exercises.    Family history is unknown by patient.  Given his age and comorbidities, noncontributory.  Wt Readings from Last 3 Encounters:  06/21/17 167 lb 6.4 oz (75.9 kg)  05/24/17 168 lb 3.2 oz (76.3 kg)  09/06/16 170 lb (77.1 kg)    PHYSICAL EXAM BP 113/71   Pulse 79   Ht 5' 9.5" (1.765 m)   Wt 167 lb 6.4 oz (75.9 kg)   SpO2 92%   BMI 24.37 kg/m  Physical Exam  Constitutional: He is oriented to person, place, and time. He appears well-developed and well-nourished. No distress.  Looks younger than stated age.  HENT:  Head: Normocephalic and atraumatic.  Neck:  Normal range of motion. Neck supple. Decreased carotid pulses (Delayed upstroke) present. No JVD present. Carotid bruit is not present (Radiated aortic murmur).  Cardiovascular: Normal rate, regular rhythm, S1 normal, S2 normal and normal pulses.  Occasional extrasystoles are present. PMI is not displaced. Exam reveals no gallop and no friction rub.  Murmur heard.  Medium-pitched harsh crescendo-decrescendo mid to late systolic murmur is present with a grade of 3/6 at the upper right sternal border and upper left sternal border radiating to the neck. No carotid bruit - radiated AS murmur -with  delayed carotid upstroke  Pulmonary/Chest: Effort normal and breath sounds normal. No respiratory distress. He has no wheezes. He has no rales. He exhibits no tenderness.  Abdominal: Soft. Bowel sounds are normal. He exhibits no distension. There is no tenderness. There is no rebound.  No HJR  Musculoskeletal: Normal range of motion. He exhibits no edema or deformity.  Neurological: He is alert and oriented to person, place, and time.  Psychiatric: He has a normal mood and affect. His behavior is normal. Thought content normal.  Nursing note and vitals reviewed.   Adult ECG Report n/a  Other studies Reviewed: Additional studies/ records that were reviewed today include:  Recent Labs:  Reviewed in [place] -  February 19, 2017: Total cholesterol 144, TG 96, HDL 52, LDL 73.  Lab Results  Component Value Date   WBC 5.1 05/24/2017   HGB 14.5 05/24/2017   HCT 42.0 05/24/2017   MCV 91 05/24/2017   PLT 166 05/24/2017   Lab Results  Component Value Date   TSH 4.620 (H) 05/24/2017     ASSESSMENT / PLAN: Problem List Items Addressed This Visit    Symptomatic PVCs    Not seemingly a problem despite having stopped Toprol.  Continue to monitor      Relevant Medications   ezetimibe-simvastatin (VYTORIN) 10-20 MG tablet   Hyperlipidemia with target LDL less than 70 (Chronic)     Thankfully, he really did not notice any benefit from being off of statin, we will have him restart Vytorin.      Relevant Medications   ezetimibe-simvastatin (VYTORIN) 10-20 MG tablet   Fatigue due to treatment    Unfortunately, he did not notice any benefit from having backed off beta-blocker or stopping statin.  No signs of anemia.  TSH was slightly elevated, also we will recheck free T4.  Question if some of this could be psychological versus psychosocial.      Relevant Orders   T4, free (Completed)   Vitamin D 1,25 dihydroxy (Completed)   T3 (Completed)   Essential hypertension (Chronic)    Remains relatively well controlled now on no blood pressure medications.  Trying to allow for mild permissive hypertension.      Relevant Medications   ezetimibe-simvastatin (VYTORIN) 10-20 MG tablet   CAD S/P percutaneous coronary angioplasty --> PCI RCA Promus DES 2.5 mm x 23 mm (Chronic)   Relevant Medications   ezetimibe-simvastatin (VYTORIN) 10-20 MG tablet   Other Relevant Orders   T4, free (Completed)   Vitamin D 1,25 dihydroxy (Completed)   T3 (Completed)   Atypical angina (HCC)    No longer on beta-blocker.  He has not had ischemic evaluation in the past few years, and if he continues to notice these dyspnea symptoms, we could consider stress test evaluation.      Relevant Medications   ezetimibe-simvastatin (VYTORIN) 10-20 MG tablet   Aortic valve stenosis, moderate - Primary (Chronic)    Gradients on follow-up echo continue to be stable with no sign of progression to severe stenosis.  Continue annual follow-up echoes.  Relevant Medications   ezetimibe-simvastatin (VYTORIN) 10-20 MG tablet   Other Relevant Orders   Vitamin D 1,25 dihydroxy (Completed)    Other Visit Diagnoses    Vitamin D insufficiency       Relevant Orders   Vitamin D 1,25 dihydroxy (Completed)      Current medicines are reviewed at length with the patient today. (+/- concerns) n/a The  following changes have been made: n/a  Patient Instructions  MEDICATION INSTRUCTIONS   RESTART GENERIC VYTORIN 10/20 MG TAKE AT BEDTIME    LABS FREE T4  T3 VIT D     DISCUSS WITH PRIMARY ABOUT YOUR SYMPTOMS    Your physician wants you to follow-up in [place] DR Melessa Cowell.You will receive a reminder letter in the mail two months in advance. If you don't receive a letter, please call our office to schedule the follow-up appointment.    If you need a refill on your cardiac medications before your next appointment, please call your pharmacy.       Studies Ordered:   Orders Placed This Encounter  Procedures  . T4, free  . Vitamin D 1,25 dihydroxy  . T3      [name], M.D., M.S. Interventional Cardiologist   Pager # [phone] Phone # [phone] [address] [place], [place] [address]

## 2017-06-23 ENCOUNTER — Encounter: Payer: Self-pay | Admitting: Cardiology

## 2017-06-23 NOTE — Assessment & Plan Note (Signed)
Remains relatively well controlled now on no blood pressure medications.  Trying to allow for mild permissive hypertension.

## 2017-06-23 NOTE — Assessment & Plan Note (Addendum)
Gradients on follow-up echo continue to be stable with no sign of progression to severe stenosis.  Continue annual follow-up echoes.

## 2017-06-23 NOTE — Assessment & Plan Note (Signed)
Unfortunately, he did not notice any benefit from having backed off beta-blocker or stopping statin.  No signs of anemia.  TSH was slightly elevated, also we will recheck free T4.  Question if some of this could be psychological versus psychosocial.

## 2017-06-23 NOTE — Assessment & Plan Note (Signed)
No longer on beta-blocker.  He has not had ischemic evaluation in the past few years, and if he continues to notice these dyspnea symptoms, we could consider stress test evaluation.

## 2017-06-23 NOTE — Assessment & Plan Note (Signed)
Not seemingly a problem despite having stopped Toprol.  Continue to monitor

## 2017-06-23 NOTE — Assessment & Plan Note (Signed)
Thankfully, he really did not notice any benefit from being off of statin, we will have him restart Vytorin.

## 2017-06-29 LAB — VITAMIN D 1,25 DIHYDROXY
Vitamin D 1, 25 (OH)2 Total: 33 pg/mL
Vitamin D2 1, 25 (OH)2: <10 pg/mL
Vitamin D3 1, 25 (OH)2: 33 pg/mL

## 2017-06-29 LAB — T4, FREE: Free T4: 1.1 ng/dL (ref 0.82–1.77)

## 2017-06-29 LAB — T3: T3, Total: 105 ng/dL (ref 71–180)

## 2017-08-07 ENCOUNTER — Encounter (HOSPITAL_COMMUNITY): Payer: Medicare Other

## 2017-08-07 ENCOUNTER — Other Ambulatory Visit (HOSPITAL_COMMUNITY): Payer: Medicare Other

## 2017-09-28 ENCOUNTER — Other Ambulatory Visit
Admission: RE | Admit: 2017-09-28 | Discharge: 2017-09-28 | Disposition: A | Discharge status: Home / Self Care | Payer: Medicare Other | Source: Ambulatory Visit | Attending: Unknown Physician Specialty | Admitting: Unknown Physician Specialty

## 2017-09-28 DIAGNOSIS — R197 Diarrhea, unspecified: Secondary | ICD-10-CM | POA: Insufficient documentation

## 2017-09-28 LAB — GASTROINTESTINAL PANEL BY PCR, STOOL (REPLACES STOOL CULTURE)

## 2017-09-28 LAB — C DIFFICILE QUICK SCREEN W PCR REFLEX
C Diff antigen: NEGATIVE
C Diff interpretation: NOT DETECTED
C Diff toxin: NEGATIVE

## 2017-12-10 ENCOUNTER — Other Ambulatory Visit: Payer: Self-pay | Admitting: Cardiology

## 2018-01-01 ENCOUNTER — Encounter: Payer: Self-pay | Admitting: Cardiology

## 2018-01-01 ENCOUNTER — Ambulatory Visit (INDEPENDENT_AMBULATORY_CARE_PROVIDER_SITE_OTHER): Payer: Medicare Other | Admitting: Cardiology

## 2018-01-01 VITALS — BP 108/66 | HR 80 | Ht 69.0 in | Wt 169.8 lb

## 2018-01-01 DIAGNOSIS — R001 Bradycardia, unspecified: Secondary | ICD-10-CM

## 2018-01-01 DIAGNOSIS — I350 Nonrheumatic aortic (valve) stenosis: Secondary | ICD-10-CM

## 2018-01-01 DIAGNOSIS — Z9861 Coronary angioplasty status: Secondary | ICD-10-CM

## 2018-01-01 DIAGNOSIS — E785 Hyperlipidemia, unspecified: Secondary | ICD-10-CM

## 2018-01-01 DIAGNOSIS — R5383 Other fatigue: Secondary | ICD-10-CM

## 2018-01-01 DIAGNOSIS — I208 Other forms of angina pectoris: Secondary | ICD-10-CM | POA: Diagnosis not present

## 2018-01-01 DIAGNOSIS — I10 Essential (primary) hypertension: Secondary | ICD-10-CM | POA: Diagnosis not present

## 2018-01-01 DIAGNOSIS — I2510 Atherosclerotic heart disease of native coronary artery without angina pectoris: Secondary | ICD-10-CM | POA: Diagnosis not present

## 2018-01-01 NOTE — Patient Instructions (Signed)
Medication Instructions:  Not needed If you need a refill on your cardiac medications before your next appointment, please call your pharmacy.   Lab work: Not needed If you have labs (blood work) drawn today and your tests are completely normal, you will receive your results only by: Marland Kitchen MyChart Message (if you have MyChart) OR . A paper copy in the mail If you have any lab test that is abnormal or we need to change your treatment, we will call you to review the results.  Testing/Procedures: Not needed  Follow-Up: At [facility], you and your health needs are our priority.  As part of our continuing mission to provide you with exceptional heart care, we have created designated Provider Care Teams.  These Care Teams include your primary Cardiologist (physician) and Advanced Practice Providers (APPs -  Physician Assistants and Nurse Practitioners) who all work together to provide you with the care you need, when you need it. You will need a follow up appointment in 6 months.  Please call our office 2 months in advance to schedule this appointment.  You may see  Dr [name] or one of the following Advanced Practice Providers on your designated Care Team:   Rosaria Ferries, PA-C . [name], DNP, ANP  Any Other Special Instructions Will Be Listed Below (If Applicable).

## 2018-01-01 NOTE — Progress Notes (Signed)
None  PCP: Victor Hartigan, MD  Clinic Note: Chief Complaint  Patient presents with  . Follow-up    No new complaints besides fatigue  . Coronary Artery Disease    No angina    HPI: Victor [name] is a 84 y.o. male with a PMH below who presents today for earlier than anticipated follow-up with complaints of feeling fatigue and weakness.  He has both known CAD as well as severe aortic stenosis --> he is concerned that this may be signs of his valve getting worse. He has known CAD s/p PCI to RCA in 2008. Echo at that time showed EF >55% with moderate calcification of the aortic valve leaflets.  Echo in 2012: Mild-Mod AS.  CATH 2013 for positive EKG (inferior TWI) with negative nuclear stress test. No ischemic symptoms at that time.  Patent RCA stent w/ progression of pRCA Dz to ~50-60%. Progression of oD3 lesion to 60-70% - not optimal for PCI (b/c ostial).  EF 55-60% - no RWMA. Med Rx.  Aortic valve gradient: Peak 22 mmHg, mean 13 mmHg Echo & Myoview in 08/2014 - reviewed in [place] - persistent Mod AS. Normal Nuc  2 D Echo 05/29/17: Normal LV size and function.  EF 60-65%.  GR 1 DD.  Moderate aortic stenosis with estimated valve area between 0.88-0.97 cm.  (Mean gradient 25 mmHg, peak gradient 43 mmHg)  Victor [name] was seen back in May 2019 after the echocardiogram done in April.  He was still noticing less energy.  Was able to workout, but then tired during rest of the day.  No angina.  No heart failure symptoms of PND, orthopnea or edema.  Had previously weaned off beta-blocker and stopped statin.  Since there is no change, restarted Vytorin.  Recent Studies -  Personally Reviewed - (if available, images/films reviewed: From Epic Chart or Care Everywhere)  none  Recent Hospitalizations: none  Interval History: "Victor [name]" presents for follow-up today still noticing that he has less energy than he would like.  He says that he was told that his thyroid levels were low, but on my last  evaluation he had high TSH but normal T3 and T4.  He is able to exercise at the gym almost every day and does not have any chest tightness or pressure with exertion.  He denies any real exertional dyspnea.  Just that he then is therefore tired after the end of working out.  He does not do much for the rest of the day.  He is able to do his day-to-day activities but he just does not feel like doing other things. No anginal symptoms with rest or exertion.  No PND, orthopnea or edema to suggest a there is any CHF component.  No rapid irregular heartbeats palpitations.  No skipped beats.  No syncope/near syncope or TIA/amaurosis fugax.  No notable improvement or change since restarting the Vytorin.  No claudication  ROS: A comprehensive was performed. Review of Systems  Constitutional: Positive for malaise/fatigue. Negative for chills, fever and weight loss.  HENT: Negative for congestion and nosebleeds.   Respiratory: Negative for cough and wheezing.   Cardiovascular: Negative for claudication.  Gastrointestinal: Negative for abdominal pain, blood in stool and melena.  Genitourinary: Negative for hematuria.  Musculoskeletal: Positive for joint pain (Still has arthritis pains, but no worse).  Neurological: Positive for dizziness (Poor balance). Negative for focal weakness and weakness.  Psychiatric/Behavioral: Negative for memory loss. The patient does not have insomnia.  No longer noting excess sleepiness  All other systems reviewed and are negative.  I have reviewed and (if needed) personally updated the patient's problem list, medications, allergies, past medical and surgical history, social and family history.   Past Medical History:  Diagnosis Date  . Angina   . Aortic valve stenosis, moderate 08/2014   Progression to moderate-severe stenosis: Echo in June 2018 showed mean gradient 30 mmHg, peak gradient 57 mmHg.  . Arthritis   . Bilateral carotid artery disease ([place])    CAROTID  DOPPLER,05/21/2008 - Right and left ICA-0-49% diameter reduction, left CCA-0-49% diameter reductiion  . BPH (benign prostatic hypertrophy)   . CAD S/P percutaneous coronary angioplasty 07/2006   PCI to RCA - Promus DES 2.5 mm x 23 mm; 2D ECHO - EF >55%, moderate calcification of the aortic valve leaflets  . Diabetes mellitus   . GERD (gastroesophageal reflux disease)   . High cholesterol   . Hypertension    "from the diabetes"    Past Surgical History:  Procedure Laterality Date  . CARDIAC CATHETERIZATION with PCI  08/27/2006   RCA-mid - 2.5x12mm Promus stent  . CATARACT EXTRACTION W/PHACO Right 03/08/2015   Procedure: CATARACT EXTRACTION PHACO AND INTRAOCULAR LENS PLACEMENT (IOC);  Surgeon: Victor Cotta, MD;  Location: [facility] ORS;  Service: Ophthalmology;  Laterality: Right;  [place]: 01:29.4   . COLECTOMY  ~ 2000  . LEFT HEART CATHETERIZATION WITH CORONARY ANGIOGRAM N/A 03/22/2011   Procedure: LEFT HEART CATHETERIZATION WITH CORONARY ANGIOGRAM;  Surgeon: Victor Man, MD;  Location: [facility] CATH LAB;  Service: Cardiovascular::: Patent RCA stent w/ progression of pRCA Dz to ~50-60%. Progression of oD3 lesion to 60-70% - not optimal for PCI (b/c ostial).  EF 55-60% - no RWMA. Med Rx.  Aortic valve gradient: Peak 22 mmHg, mean 13 mmHg  . NM MYOVIEW LTD  08/2014   LOW RISK. NORMAL.  EF 45-54%.   . TRANSTHORACIC ECHOCARDIOGRAM  04/2017   Normal LV size and function.  EF 60-65%.  GR 1 DD.  Moderate aortic stenosis with estimated valve area between 0.88-0.97 cm.  (Mean gradient 25 mmHg, peak gradient 43 mmHg)  . TRANSTHORACIC ECHOCARDIOGRAM  08/26/2015   Normal LV size with mild concentric hypertrophy. Normal EF 55-60%. No RWMA. GR 1 DD. Severe aortic stenosis (mean gradient 24 mmHg, AVA 0.75 cm) --> progression from 2016. Mild-moderate MR. Mildly dilated RV. Mildly elevated PA pressures (40 mmHg.  Marland Kitchen TRANSTHORACIC ECHOCARDIOGRAM  06/2016   EF 60-65%. GR 1 DD. Moderate to severe aortic stenosis with a  mean gradient 30 mmHg, peak gradient of 57 mmHg.   Marland Kitchen TRANSURETHRAL RESECTION OF PROSTATE  ~ 2010    Current Meds  Medication Sig  . aspirin EC 81 MG tablet Take 81 mg by mouth daily.  . calcium carbonate (OS-CAL) 600 MG TABS tablet Take 600 mg by mouth daily.  . cetirizine (ZYRTEC) 10 MG tablet Take 10 mg by mouth daily.  . Cholecalciferol (VITAMIN D3) 2000 UNITS TABS Take 1 tablet by mouth daily.  . Coenzyme Q10 (CO Q 10 PO) Take by mouth daily.  Marland Kitchen donepezil (ARICEPT) 10 MG tablet Take 10 mg by mouth daily.  Marland Kitchen EPIPEN 2-PAK 0.3 MG/0.3ML SOAJ injection Inject 0.3 mLs as directed as directed.  . ezetimibe-simvastatin (VYTORIN) 10-20 MG tablet Take 1 tablet by mouth daily.  . fluticasone (FLONASE) 50 MCG/ACT nasal spray Place 2 sprays into both nostrils as needed.   . gabapentin (NEURONTIN) 100 MG capsule Take 200 mg by mouth  at bedtime.  Marland Kitchen glipiZIDE (GLUCOTROL XL) 2.5 MG 24 hr tablet Take 2.5 mg by mouth daily with breakfast.  . metoprolol succinate (TOPROL-XL) 25 MG 24 hr tablet TAKE 1 TABLET BY MOUTH ONCE DAILY  . Misc Natural Products (OSTEO BI-FLEX ADV JOINT SHIELD PO) Take 1 tablet by mouth daily.  . Multiple Vitamin (MULITIVITAMIN WITH MINERALS) TABS Take 1 tablet by mouth daily.  Marland Kitchen MYRBETRIQ 50 MG TB24 tablet Take 50 mg by mouth daily.   . Omega 3 1200 MG CAPS Take 1,200 mg by mouth 2 (two) times daily.  . Saw Palmetto 450 MG CAPS Take 2 capsules by mouth daily.  . Tamsulosin HCl (FLOMAX) 0.4 MG CAPS Take 0.4 mg by mouth daily. Take 30 minutes after same meal each day.  . vitamin B-12 (CYANOCOBALAMIN) 1000 MCG tablet Take 1,000 mcg by mouth daily.   --No longer taking Toprol.  Allergies  Allergen Reactions  . Azithromycin Rash and Anaphylaxis  . Cephalosporins Other (See Comments) and Anaphylaxis    unknown  . Codeine Itching, Rash and Anaphylaxis    Other reaction(s): Unknown  . Doxycycline Anaphylaxis  . Oraxyl [Doxycycline Hyclate] Swelling    "started swelling in my  mouth & tongue; had to go to emergency room; really bad reaction"  . Doxycycline Hyclate     Other reaction(s): Unknown  . Phenylephrine-Guaifenesin     Other reaction(s): Other (See Comments)  . Pseudoephedrine Other (See Comments)    unknown Other reaction(s): Unknown Other reaction(s): Unknown Other reaction(s): Unknown   . Amoxicillin Rash  . Levofloxacin Rash  . Levofloxacin Rash  . Penicillins Rash    Other reaction(s): Unknown  . Sulfa Antibiotics Rash  . Sulfasalazine Rash    Social History   Tobacco Use  . Smoking status: Never Smoker  . Smokeless tobacco: Former Systems developer    Types: Chew  Substance Use Topics  . Alcohol use: Yes    Alcohol/week: 6.0 standard drinks    Types: 6 Glasses of wine per week  . Drug use: No   Social History   Social History Narrative   Father of 2, grandfather 80.   Exercise for almost 2 hours a day, doing least 20 minutes on the elliptical trainer. He does his to 4 days a week. He'll also does weights and stretching exercises.    Family history is unknown by patient.  Given his age and comorbidities, noncontributory.  Wt Readings from Last 3 Encounters:  01/01/18 169 lb 12.8 oz (77 kg)  06/21/17 167 lb 6.4 oz (75.9 kg)  05/24/17 168 lb 3.2 oz (76.3 kg)    PHYSICAL EXAM BP 108/66   Pulse 80   Ht 5\' 9"  (1.753 m)   Wt 169 lb 12.8 oz (77 kg)   SpO2 95%   BMI 25.08 kg/m  Physical Exam  Constitutional: He is oriented to person, place, and time. He appears well-developed and well-nourished. No distress.  Well-groomed.  Appears younger than his stated age.  HENT:  Head: Normocephalic and atraumatic.  Mouth/Throat: Oropharynx is clear and moist.  Neck: Normal range of motion. Neck supple. Decreased carotid pulses (Delayed upstroke) present. No JVD present. Carotid bruit is not present (Radiated aortic murmur).  Cardiovascular: Normal rate, regular rhythm, S1 normal, S2 normal and normal pulses.  Occasional extrasystoles are present.  PMI is not displaced. Exam reveals no gallop and no friction rub.  Murmur heard.  Medium-pitched harsh crescendo-decrescendo mid to late systolic murmur is present with a grade of 3/6 at  the upper right sternal border and upper left sternal border radiating to the neck. No carotid bruit - radiated AS murmur -with  delayed carotid upstroke  Pulmonary/Chest: Effort normal and breath sounds normal. No respiratory distress. He has no wheezes. He has no rales.  Abdominal: Soft. Bowel sounds are normal. He exhibits no distension. There is no tenderness. There is no rebound.  No HJR  Musculoskeletal: Normal range of motion. He exhibits no edema.  Neurological: He is alert and oriented to person, place, and time.  Psychiatric: He has a normal mood and affect. His behavior is normal. Judgment and thought content normal.  Nursing note and vitals reviewed.   Adult ECG Report n/a  Other studies Reviewed: Additional studies/ records that were reviewed today include:  Recent Labs:  Reviewed in [place] -  08/28/2017: Total cholesterol 126, TG 107, HDL 51, LDL 54.    Lab Results  Component Value Date   WBC 5.1 05/24/2017   HGB 14.5 05/24/2017   HCT 42.0 05/24/2017   MCV 91 05/24/2017   PLT 166 05/24/2017   Lab Results  Component Value Date   TSH 4.620 (H) 05/24/2017     ASSESSMENT / PLAN: Problem List Items Addressed This Visit    Aortic valve stenosis, moderate - Primary (Chronic)    Stable valve gradient.  We can discuss rechecking an echocardiogram at rest see him back in 6 months.  I do not think this is anything to do with his symptoms.  He does have some seasonal changes in his symptoms, but no angina or heart failure symptoms.      Bradycardia (Chronic)    Stable heart rate on reduced dose of beta-blocker.  Current rate 80 BPM      CAD S/P percutaneous coronary angioplasty --> PCI RCA Promus DES 2.5 mm x 23 mm (Chronic)    No active angina symptoms.  Stable  echocardiogram.  Nonischemic Myoview in 2016.  Stopped his Plavix earlier this year.  Remains on baby aspirin.  Is on Vytorin and Toprol.      Essential hypertension (Chronic)    Well-controlled to borderline hypotensive on minimal dose of Toprol.      Fatigue due to treatment    No real change in symptoms with stopping statin and or beta-blocker.  In all honesty, the fact that he is able to exercise at the gym every day, I think he probably has more energy in the most 82 year old's.  Is encouraged him to continue trying to stay active maybe needs to take a nap in the day.  I do not see any medication reason for his fatigue.      Hyperlipidemia with target LDL less than 70 (Chronic)    His labs look much better after having been back on Vytorin.  Well controlled.  No change.  Follow-up with PCP.         Current medicines are reviewed at length with the patient today. (+/- concerns) n/a The following changes have been made: n/a  Patient Instructions  Medication Instructions:  Not needed If you need a refill on your cardiac medications before your next appointment, please call your pharmacy.   Lab work: Not needed If you have labs (blood work) drawn today and your tests are completely normal, you will receive your results only by: Marland Kitchen MyChart Message (if you have MyChart) OR . A paper copy in the mail If you have any lab test that is abnormal or we need to change your treatment,  we will call you to review the results.  Testing/Procedures: Not needed  Follow-Up: At [facility], you and your health needs are our priority.  As part of our continuing mission to provide you with exceptional heart care, we have created designated Provider Care Teams.  These Care Teams include your primary Cardiologist (physician) and Advanced Practice Providers (APPs -  Physician Assistants and Nurse Practitioners) who all work together to provide you with the care you need, when you need it. You  will need a follow up appointment in 6 months.  Please call our office 2 months in advance to schedule this appointment.  You may see  Dr [name] or one of the following Advanced Practice Providers on your designated Care Team:   Rosaria Ferries, PA-C . [name], DNP, ANP  Any Other Special Instructions Will Be Listed Below (If Applicable).    Studies Ordered:   No orders of the defined types were placed in this encounter.     [name], M.D., M.S. Interventional Cardiologist   Pager # [phone] Phone # [phone] [address] [place], [place] [address]

## 2018-01-03 ENCOUNTER — Encounter: Payer: Self-pay | Admitting: Cardiology

## 2018-01-03 NOTE — Assessment & Plan Note (Signed)
Stable valve gradient.  We can discuss rechecking an echocardiogram at rest see him back in 6 months.  I do not think this is anything to do with his symptoms.  He does have some seasonal changes in his symptoms, but no angina or heart failure symptoms.

## 2018-01-03 NOTE — Assessment & Plan Note (Signed)
Well-controlled to borderline hypotensive on minimal dose of Toprol.

## 2018-01-03 NOTE — Assessment & Plan Note (Signed)
No real change in symptoms with stopping statin and or beta-blocker.  In all honesty, the fact that he is able to exercise at the gym every day, I think he probably has more energy in the most 82 year old's.  Is encouraged him to continue trying to stay active maybe needs to take a nap in the day.  I do not see any medication reason for his fatigue.

## 2018-01-03 NOTE — Assessment & Plan Note (Signed)
No active angina symptoms.  Stable echocardiogram.  Nonischemic Myoview in 2016.  Stopped his Plavix earlier this year.  Remains on baby aspirin.  Is on Vytorin and Toprol.

## 2018-01-03 NOTE — Assessment & Plan Note (Signed)
His labs look much better after having been back on Vytorin.  Well controlled.  No change.  Follow-up with PCP.

## 2018-01-03 NOTE — Assessment & Plan Note (Addendum)
Stable heart rate on reduced dose of beta-blocker.  Current rate 80 BPM

## 2018-05-24 DIAGNOSIS — M545 Low back pain, unspecified: Secondary | ICD-10-CM | POA: Insufficient documentation

## 2018-06-27 ENCOUNTER — Telehealth: Payer: Self-pay

## 2018-06-27 NOTE — Telephone Encounter (Signed)
Left message for patient to call office back to change office visit to virtual visit on June 3 at 10:00am due to covid 19.

## 2018-07-01 ENCOUNTER — Telehealth: Payer: Self-pay | Admitting: *Deleted

## 2018-07-01 NOTE — Telephone Encounter (Signed)
Voice mailbox full. Left message on home phone to call back

## 2018-07-01 NOTE — Telephone Encounter (Signed)
Left message with wife - need switch to virtual visit - wife suggest calling back in 1 to 2 hours

## 2018-07-01 NOTE — Telephone Encounter (Signed)
call [phone] my chart via emailed/ consent/ pre reg completed

## 2018-07-03 ENCOUNTER — Telehealth: Payer: Self-pay | Admitting: *Deleted

## 2018-07-03 ENCOUNTER — Encounter: Payer: Self-pay | Admitting: Cardiology

## 2018-07-03 ENCOUNTER — Telehealth (INDEPENDENT_AMBULATORY_CARE_PROVIDER_SITE_OTHER): Payer: Medicare Other | Admitting: Cardiology

## 2018-07-03 VITALS — BP 103/67 | HR 86 | Ht 69.0 in

## 2018-07-03 DIAGNOSIS — I350 Nonrheumatic aortic (valve) stenosis: Secondary | ICD-10-CM

## 2018-07-03 DIAGNOSIS — Z9861 Coronary angioplasty status: Secondary | ICD-10-CM

## 2018-07-03 DIAGNOSIS — I2510 Atherosclerotic heart disease of native coronary artery without angina pectoris: Secondary | ICD-10-CM

## 2018-07-03 DIAGNOSIS — E785 Hyperlipidemia, unspecified: Secondary | ICD-10-CM

## 2018-07-03 DIAGNOSIS — I10 Essential (primary) hypertension: Secondary | ICD-10-CM

## 2018-07-03 DIAGNOSIS — I493 Ventricular premature depolarization: Secondary | ICD-10-CM

## 2018-07-03 DIAGNOSIS — R001 Bradycardia, unspecified: Secondary | ICD-10-CM

## 2018-07-03 NOTE — Telephone Encounter (Signed)
SPOKE TO PATIENT -  INSTRUCTION GIVEN FROM  TELE-VISIT 07/03/18- AVS SUMMARY WILL BE MAILED. ECHO ORDERED FOR DEC 2020 AT [facility] OFFICE.  PATIENT VERBALIZED UNDERSTANDING.

## 2018-07-03 NOTE — Assessment & Plan Note (Signed)
No longer on Vytorin, but is on the components of Zetia and simvastatin.  Doing very well.  Labs been followed by PCP.  Most recent checks have been excellent.

## 2018-07-03 NOTE — Patient Instructions (Addendum)
Medication Instructions:  None  If you need a refill on your cardiac medications before your next appointment, please call your pharmacy.   Lab work: None  Testing/Procedures: WILL BE SCHEDULE AT [facility] OFFICE - WILL CONTACT YOU - FOR DEC 2020 TIMEFRAME Transthoracic echocardiogram prior to follow-up visit (for aortic stenosis)Your physician has requested that you have an echocardiogram. Echocardiography is a painless test that uses sound waves to create images of your heart. It provides your doctor with information about the size and shape of your heart and how well your heart's chambers and valves are working. This procedure takes approximately one hour. There are no restrictions for this procedure.    Follow-Up: At [facility], you and your health needs are our priority.  As part of our continuing mission to provide you with exceptional heart care, we have created designated Provider Care Teams.  These Care Teams include your primary Cardiologist (physician) and Advanced Practice Providers (APPs -  Physician Assistants and Nurse Practitioners) who all work together to provide you with the care you need, when you need it. . You will need a follow up appointment in  6-8 months DEC TO FEB 2021.  Please call our office 2 months in advance to schedule this appointment.  You may see [name], MD or one of the following Advanced Practice Providers on your designated Care Team:   . Rosaria Ferries, PA-C . [name], DNP, ANP  Any Other Special Instructions Will Be Listed Below. --Even though he can go to the gym, and need to get out and do walking and other exercises that can be done at the house.  As soon as possible, try to get back to the gym.

## 2018-07-03 NOTE — Progress Notes (Signed)
Virtual Visit via Video Note   This visit type was conducted due to national recommendations for restrictions regarding the COVID-19 Pandemic (e.g. social distancing) in an effort to limit this patient's exposure and mitigate transmission in our community.  Due to his co-morbid illnesses, this patient is at least at moderate risk for complications without adequate follow up.  This format is felt to be most appropriate for this patient at this time.  All issues noted in this document were discussed and addressed.  A limited physical exam was performed with this format.  Please refer to the patient's chart for his consent to telehealth for [name].   Patient has given verbal permission to conduct this visit via virtual appointment and to bill insurance 07/03/2018 12:35 PM     Evaluation Performed:  Follow-up visit  Date:  07/03/2018   ID:  Victor, [name] [birth year], MRN [id number]  Patient Location: Home Provider Location: Home  PCP:  Sofie Hartigan, MD  Cardiologist:  [name], MD  Electrophysiologist:  None   Chief Complaint:  6 month f/u - CAD-PCI, Mild-Mod AS   History of Present Illness:    [name] - "Cotton" is a 85 y.o. male with PMH notable for CAD-PCI, Mild-Mod AS  who presents via audio/video conferencing for a telehealth visit today.   He has known CAD s/p PCI to RCA in 2008. Echo at that time showed EF >55% with moderate calcification of the aortic valve leaflets.   Echo in 2012: Mild-Mod AS.   CATH 2013 for positive EKG (inferior TWI) with negative nuclear stress test. No ischemic symptoms at that time.   Patent RCA stent w/ progression of pRCA Dz to ~50-60%. Progression of oD3 lesion to 60-70% - not optimal for PCI (b/c ostial).  EF 55-60% - no RWMA. Med Rx.  Aortic valve gradient: Peak 22 mmHg, mean 13 mmHg  Echo & Myoview in 08/2014 - reviewed in [place] - persistent Mod AS. Normal Nuc   2 D Echo 05/29/17: Normal LV size and function.  EF 60-65%.   GR 1 DD.  Moderate aortic stenosis with estimated valve area between 0.88-0.97 cm.  (Mean gradient 25 mmHg, peak gradient 43 mmHg)  Jerron L Slider was last seen in December 2019.  For the most part he noted less energy than we would like to have.  (TSH evaluation showed high TSH but normal T3 and T4).  Was able to exercise at the gym almost every day without any chest tightness or pressure or dyspnea.  Just tired at the end of the workout. --We have had to reduce his beta-blocker dose due to bradycardia --No longer on Plavix  Interval History:  "Cotton" is doing pretty well overall.  Unfortunately, with the gym closures, he has not been able to get his exercising as much as he had been.  He has been a little bit reluctant to go outside and walks, but is is hoping to start doing that now because he realizes that he is getting a little more tired doing routine activities that he did not get tired before.  He is always been a little bit fatigued at the end of exercise, but always felt fine with his exercise.  He is not having any chest tightness or pressure with rest or exertion no exertional dyspnea.  No bleeding issues since being off Plavix.  Overall stable from a cardiovascular standpoint. Cardiovascular ROS: no chest pain or dyspnea on exertion positive for - a  little more fatigue with getting out of shape negative for - edema, irregular heartbeat, orthopnea, palpitations, paroxysmal nocturnal dyspnea, rapid heart rate, shortness of breath or syncope/ near-syncope; TIA/amaurosis fugax  The patient does not have symptoms concerning for COVID-19 infection (fever, chills, cough, or new shortness of breath).  The patient is practicing social distancing.  His son is helping out with necessities.  ROS:  Please see the history of present illness.    Review of Systems  Constitutional: Positive for malaise/fatigue (still tired - but has not really been able to do his usual exercises). Negative for  weight loss.  HENT: Positive for congestion and sinus pain. Negative for nosebleeds.        Allergies  Respiratory: Negative for hemoptysis and shortness of breath.   Cardiovascular: Negative for leg swelling.  Gastrointestinal: Negative for abdominal pain, blood in stool, heartburn and melena.  Genitourinary: Negative for frequency.  Musculoskeletal: Positive for back pain (& buttock pain - over did it moving dirt (yard work)). Negative for myalgias.  Endo/Heme/Allergies: Positive for environmental allergies.  Psychiatric/Behavioral: Negative for memory loss. The patient is not nervous/anxious and does not have insomnia.   All other systems reviewed and are negative.   Past Medical History:  Diagnosis Date  . Angina   . Aortic valve stenosis, moderate 08/2014   Progression to moderate-severe stenosis: Echo in June 2018 showed mean gradient 30 mmHg, peak gradient 57 mmHg.  . Arthritis   . Bilateral carotid artery disease ([place])    CAROTID DOPPLER,05/21/2008 - Right and left ICA-0-49% diameter reduction, left CCA-0-49% diameter reductiion  . BPH (benign prostatic hypertrophy)   . CAD S/P percutaneous coronary angioplasty 07/2006   PCI to RCA - Promus DES 2.5 mm x 23 mm; 2D ECHO - EF >55%, moderate calcification of the aortic valve leaflets  . Diabetes mellitus   . GERD (gastroesophageal reflux disease)   . High cholesterol   . Hypertension    "from the diabetes"   Past Surgical History:  Procedure Laterality Date  . CARDIAC CATHETERIZATION with PCI  08/27/2006   RCA-mid - 2.5x61mm Promus stent  . CATARACT EXTRACTION W/PHACO Right 03/08/2015   Procedure: CATARACT EXTRACTION PHACO AND INTRAOCULAR LENS PLACEMENT (IOC);  Surgeon: Estill Cotta, MD;  Location: [facility] ORS;  Service: Ophthalmology;  Laterality: Right;  [place]: 01:29.4   . COLECTOMY  ~ 2000  . LEFT HEART CATHETERIZATION WITH CORONARY ANGIOGRAM N/A 03/22/2011   Procedure: LEFT HEART CATHETERIZATION WITH CORONARY ANGIOGRAM;   Surgeon: Leonie Man, MD;  Location: [name];  Service: Cardiovascular::: Patent RCA stent w/ progression of pRCA Dz to ~50-60%. Progression of oD3 lesion to 60-70% - not optimal for PCI (b/c ostial).  EF 55-60% - no RWMA. Med Rx.  Aortic valve gradient: Peak 22 mmHg, mean 13 mmHg  . NM MYOVIEW LTD  08/2014   LOW RISK. NORMAL.  EF 45-54%.   . TRANSTHORACIC ECHOCARDIOGRAM  04/2017   Normal LV size and function.  EF 60-65%.  GR 1 DD.  Moderate aortic stenosis with estimated valve area between 0.88-0.97 cm.  (Mean gradient 25 mmHg, peak gradient 43 mmHg)  . TRANSTHORACIC ECHOCARDIOGRAM  08/26/2015   Normal LV size with mild concentric hypertrophy. Normal EF 55-60%. No RWMA. GR 1 DD. Severe aortic stenosis (mean gradient 24 mmHg, AVA 0.75 cm) --> progression from 2016. Mild-moderate MR. Mildly dilated RV. Mildly elevated PA pressures (40 mmHg.  Marland Kitchen TRANSTHORACIC ECHOCARDIOGRAM  06/2016   EF 60-65%. GR 1 DD. Moderate to severe  aortic stenosis with a mean gradient 30 mmHg, peak gradient of 57 mmHg.   Marland Kitchen TRANSURETHRAL RESECTION OF PROSTATE  ~ 2010     Current Meds  Medication Sig  . aspirin EC 81 MG tablet Take 81 mg by mouth daily.  . calcium carbonate (OS-CAL) 600 MG TABS tablet Take 600 mg by mouth daily.  . cetirizine (ZYRTEC) 10 MG tablet Take 10 mg by mouth daily.  . Cholecalciferol (VITAMIN D3) 2000 UNITS TABS Take 1 tablet by mouth daily.  . Coenzyme Q10 (CO Q 10 PO) Take by mouth daily.  . cyclobenzaprine (FLEXERIL) 10 MG tablet Take 10 mg by mouth daily as needed.  . donepezil (ARICEPT) 10 MG tablet Take 10 mg by mouth daily.  Marland Kitchen EPIPEN 2-PAK 0.3 MG/0.3ML SOAJ injection Inject 0.3 mLs as directed as directed.  . ezetimibe (ZETIA) 10 MG tablet Take 10 mg by mouth daily.  . fluticasone (FLONASE) 50 MCG/ACT nasal spray Place 2 sprays into both nostrils as needed.   . gabapentin (NEURONTIN) 100 MG capsule Take 200 mg by mouth at bedtime.  Marland Kitchen glipiZIDE (GLUCOTROL XL) 2.5 MG 24 hr tablet  Take 2.5 mg by mouth daily with breakfast.  . metoprolol succinate (TOPROL-XL) 25 MG 24 hr tablet TAKE 1 TABLET BY MOUTH ONCE DAILY  . Misc Natural Products (OSTEO BI-FLEX ADV JOINT SHIELD PO) Take 1 tablet by mouth daily.  . Multiple Vitamin (MULITIVITAMIN WITH MINERALS) TABS Take 1 tablet by mouth daily.  Marland Kitchen MYRBETRIQ 50 MG TB24 tablet Take 50 mg by mouth daily.   . Omega 3 1200 MG CAPS Take 1,200 mg by mouth 2 (two) times daily.  . Saw Palmetto 450 MG CAPS Take 2 capsules by mouth daily.  . simvastatin (ZOCOR) 40 MG tablet Take 40 mg by mouth at bedtime.  . Tamsulosin HCl (FLOMAX) 0.4 MG CAPS Take 0.4 mg by mouth daily. Take 30 minutes after same meal each day.  . vitamin B-12 (CYANOCOBALAMIN) 1000 MCG tablet Take 1,000 mcg by mouth daily.     Allergies:   Azithromycin; Cephalosporins; Codeine; Doxycycline; Oraxyl [doxycycline hyclate]; Doxycycline hyclate; Phenylephrine-guaifenesin; Pseudoephedrine; Amoxicillin; Levofloxacin; Levofloxacin; Penicillins; Sulfa antibiotics; and Sulfasalazine   Social History   Tobacco Use  . Smoking status: Never Smoker  . Smokeless tobacco: Former Systems developer    Types: Chew  Substance Use Topics  . Alcohol use: Yes    Alcohol/week: 6.0 standard drinks    Types: 6 Glasses of wine per week  . Drug use: No     Family Hx: The patient's Family history is unknown by patient.   Prior CV studies:   The following studies were reviewed today: . none:  Labs/Other Tests and Data Reviewed:    EKG:  No ECG reviewed.  Recent Labs: No results found for requested labs within last 8760 hours.   Recent Lipid Panel - PCP checks lipids - have been doing well  Wt Readings from Last 3 Encounters:  01/01/18 169 lb 12.8 oz (77 kg)  06/21/17 167 lb 6.4 oz (75.9 kg)  05/24/17 168 lb 3.2 oz (76.3 kg)     Objective:    Vital Signs:  BP 103/67   Pulse 86   Ht 5\' 9"  (1.753 m)   BMI 25.08 kg/m   VITAL SIGNS:  reviewed GEN:  Well nourished, well developed male in  no acute distress. RESPIRATORY:  normal respiratory effort, symmetric expansion CARDIOVASCULAR:  no peripheral edema MUSCULOSKELETAL:  no obvious deformities. NEURO:  alert and oriented x 3,  no obvious focal deficit PSYCH:  normal affect  ASSESSMENT & PLAN:    Problem List Items Addressed This Visit    Aortic valve stenosis, moderate - Primary (Chronic)    Stable gradients by echo last April.  Revisit COVID-19 restrictions, we will delay follow-up of his echocardiogram until just prior to his 4-month follow-up visit with me.  He is not having any symptoms.  If his gradients are stable at that visit, would probably wait 2 years prior to next check.      Relevant Medications   ezetimibe (ZETIA) 10 MG tablet   simvastatin (ZOCOR) 40 MG tablet   Other Relevant Orders   ECHOCARDIOGRAM COMPLETE   Bradycardia (Chronic)    He says that since he has been not been exercising as much, his heart rate is actually picked up a little bit.  Currently now more in the 80-90 bpm range.  No tachycardia spells and no signs of chronotropic incompetence.      Relevant Orders   ECHOCARDIOGRAM COMPLETE   CAD S/P percutaneous coronary angioplasty --> PCI RCA Promus DES 2.5 mm x 23 mm (Chronic)    Doing well with no active angina symptoms.  Nonischemic Myoview in 2016 with stable echocardiogram. No longer on Plavix.  Is on aspirin 81 mg. On stable dose of simvastatin/ezetimibe and low-dose Toprol.      Relevant Medications   ezetimibe (ZETIA) 10 MG tablet   simvastatin (ZOCOR) 40 MG tablet   Other Relevant Orders   ECHOCARDIOGRAM COMPLETE   Essential hypertension (Chronic)   Relevant Medications   ezetimibe (ZETIA) 10 MG tablet   simvastatin (ZOCOR) 40 MG tablet   Hyperlipidemia with target LDL less than 70 (Chronic)    No longer on Vytorin, but is on the components of Zetia and simvastatin.  Doing very well.  Labs been followed by PCP.  Most recent checks have been excellent.      Relevant  Medications   ezetimibe (ZETIA) 10 MG tablet   simvastatin (ZOCOR) 40 MG tablet   Symptomatic PVCs    No longer a problem.  This is despite minimal dose of Toprol.      Relevant Medications   ezetimibe (ZETIA) 10 MG tablet   simvastatin (ZOCOR) 40 MG tablet   Other Relevant Orders   ECHOCARDIOGRAM COMPLETE      COVID-19 Education: The signs and symptoms of COVID-19 were discussed with the patient and how to seek care for testing (follow up with PCP or arrange E-visit).   The importance of social distancing was discussed today.  Time:   Today, I have spent 18 minutes with the patient with telehealth technology discussing the above problems.  5-10 min with chart review/charting.   Medication Adjustments/Labs and Tests Ordered: Current medicines are reviewed at length with the patient today.  Concerns regarding medicines are outlined above.  Medication Instructions:   No change  Tests Ordered: Orders Placed This Encounter  Procedures  . ECHOCARDIOGRAM COMPLETE   Echocardiogram - to be checked prior to 6 month f/u  Medication Changes: No orders of the defined types were placed in this encounter.   Disposition:  Follow up in 6-8 month(s)    Signed, [name], MD  07/03/2018 12:35 PM    [place]

## 2018-07-03 NOTE — Assessment & Plan Note (Signed)
No longer a problem.  This is despite minimal dose of Toprol.

## 2018-07-03 NOTE — Assessment & Plan Note (Signed)
Stable gradients by echo last April.  Revisit COVID-19 restrictions, we will delay follow-up of his echocardiogram until just prior to his 69-month follow-up visit with me.  He is not having any symptoms.  If his gradients are stable at that visit, would probably wait 2 years prior to next check.

## 2018-07-03 NOTE — Assessment & Plan Note (Signed)
He says that since he has been not been exercising as much, his heart rate is actually picked up a little bit.  Currently now more in the 80-90 bpm range.  No tachycardia spells and no signs of chronotropic incompetence.

## 2018-07-03 NOTE — Assessment & Plan Note (Signed)
Doing well with no active angina symptoms.  Nonischemic Myoview in 2016 with stable echocardiogram. No longer on Plavix.  Is on aspirin 81 mg. On stable dose of simvastatin/ezetimibe and low-dose Toprol.

## 2018-07-17 ENCOUNTER — Other Ambulatory Visit: Payer: Self-pay | Admitting: Cardiology

## 2018-07-31 ENCOUNTER — Other Ambulatory Visit: Payer: Self-pay | Admitting: Family Medicine

## 2018-07-31 DIAGNOSIS — R937 Abnormal findings on diagnostic imaging of other parts of musculoskeletal system: Secondary | ICD-10-CM

## 2018-09-04 ENCOUNTER — Ambulatory Visit: Payer: Medicare Other

## 2018-10-25 ENCOUNTER — Ambulatory Visit
Admission: RE | Admit: 2018-10-25 | Discharge: 2018-10-25 | Disposition: A | Discharge status: Home / Self Care | Payer: Medicare Other | Source: Ambulatory Visit | Attending: Family Medicine | Admitting: Family Medicine

## 2018-10-25 ENCOUNTER — Other Ambulatory Visit: Payer: Self-pay

## 2018-10-25 DIAGNOSIS — R937 Abnormal findings on diagnostic imaging of other parts of musculoskeletal system: Secondary | ICD-10-CM | POA: Insufficient documentation

## 2019-02-01 ENCOUNTER — Other Ambulatory Visit: Payer: Self-pay | Admitting: Cardiology

## 2019-03-03 HISTORY — PX: TRANSTHORACIC ECHOCARDIOGRAM: SHX275

## 2019-03-06 ENCOUNTER — Other Ambulatory Visit: Payer: Self-pay

## 2019-03-06 ENCOUNTER — Ambulatory Visit (INDEPENDENT_AMBULATORY_CARE_PROVIDER_SITE_OTHER): Payer: Medicare Other

## 2019-03-06 DIAGNOSIS — R001 Bradycardia, unspecified: Secondary | ICD-10-CM | POA: Diagnosis not present

## 2019-03-06 DIAGNOSIS — I350 Nonrheumatic aortic (valve) stenosis: Secondary | ICD-10-CM | POA: Diagnosis not present

## 2019-03-06 DIAGNOSIS — I493 Ventricular premature depolarization: Secondary | ICD-10-CM

## 2019-03-06 DIAGNOSIS — Z9861 Coronary angioplasty status: Secondary | ICD-10-CM

## 2019-03-06 DIAGNOSIS — I2510 Atherosclerotic heart disease of native coronary artery without angina pectoris: Secondary | ICD-10-CM

## 2019-03-10 ENCOUNTER — Telehealth: Payer: Self-pay | Admitting: *Deleted

## 2019-03-10 NOTE — Telephone Encounter (Signed)
Called left message to call back - need to give results and mkae f/u appt to discuss findings

## 2019-03-10 NOTE — Telephone Encounter (Signed)
-----   Message from Leonie Man, MD sent at 03/09/2019  5:33 PM EST ----- Echocardiogram results: Normal pump function with ejection fraction of 60 to 65%.  Not unexpected grade 1 diastolic dysfunction.Left atrium is severely dilated.  The aortic valve is severely calcified with severe stenosis.  The mean aortic valve gradient was 42.3.  This is definitely a progression from 2 years ago when it was read as moderate stenosis and mean gradient was 25 mmHg.  We can discuss follow-up plans when I see him when 64-month follow-up.  I do not see that he has an upcoming appointment, but he should be scheduled to see me in February-March of this year.  [name], MD

## 2019-03-14 NOTE — Telephone Encounter (Signed)
The patient has been notified of the result and verbalized understanding.  All questions (if any) were answered. Schedule for 04/15/19 at 1:40 pm Raiford Simmonds, RN 03/14/2019 3:33 PM

## 2019-04-15 ENCOUNTER — Other Ambulatory Visit: Payer: Self-pay

## 2019-04-15 ENCOUNTER — Ambulatory Visit (INDEPENDENT_AMBULATORY_CARE_PROVIDER_SITE_OTHER): Payer: Medicare Other | Admitting: Cardiology

## 2019-04-15 ENCOUNTER — Encounter: Payer: Self-pay | Admitting: Cardiology

## 2019-04-15 VITALS — BP 115/82 | HR 86 | Temp 95.7°F | Ht 68.0 in | Wt 172.8 lb

## 2019-04-15 DIAGNOSIS — I350 Nonrheumatic aortic (valve) stenosis: Secondary | ICD-10-CM

## 2019-04-15 DIAGNOSIS — I208 Other forms of angina pectoris: Secondary | ICD-10-CM

## 2019-04-15 DIAGNOSIS — E785 Hyperlipidemia, unspecified: Secondary | ICD-10-CM

## 2019-04-15 DIAGNOSIS — I2510 Atherosclerotic heart disease of native coronary artery without angina pectoris: Secondary | ICD-10-CM | POA: Diagnosis not present

## 2019-04-15 DIAGNOSIS — R001 Bradycardia, unspecified: Secondary | ICD-10-CM

## 2019-04-15 DIAGNOSIS — I10 Essential (primary) hypertension: Secondary | ICD-10-CM

## 2019-04-15 DIAGNOSIS — Z9861 Coronary angioplasty status: Secondary | ICD-10-CM

## 2019-04-15 NOTE — Patient Instructions (Signed)
Medication Instructions:  No change *If you need a refill on your cardiac medications before your next appointment, please call your pharmacy*   Lab Work: Not needed    Testing/Procedures: Will be schedule at [address]-- Sept 2021 Your physician has requested that you have an echocardiogram. Echocardiography is a painless test that uses sound waves to create images of your heart. It provides your doctor with information about the size and shape of your heart and how well your heart's chambers and valves are working. This procedure takes approximately one hour. There are no restrictions for this procedure.     Follow-Up: At [facility], you and your health needs are our priority.  As part of our continuing mission to provide you with exceptional heart care, we have created designated Provider Care Teams.  These Care Teams include your primary Cardiologist (physician) and Advanced Practice Providers (APPs -  Physician Assistants and Nurse Practitioners) who all work together to provide you with the care you need, when you need it.  We recommend signing up for the patient portal called "MyChart".  Sign up information is provided on this After Visit Summary.  MyChart is used to connect with patients for Virtual Visits (Telemedicine).  Patients are able to view lab/test results, encounter notes, upcoming appointments, etc.  Non-urgent messages can be sent to your provider as well.   To learn more about what you can do with MyChart, go to [email or web address].    Your next appointment:   6 month(s)- Sept 2021  The format for your next appointment:   In Person  Provider:   [name], MD   Other Instructions Contact office if you have symptoms of shortness of breath, swelling ,Chest discomfort  That is noticeable and changes your daily routine.

## 2019-04-15 NOTE — Progress Notes (Signed)
Primary Care Provider: Sofie Hartigan, MD Cardiologist: [name], MD Electrophysiologist: None  Clinic Note: Chief Complaint  Patient presents with  . Follow-up    6 months  . Aortic Stenosis    Echo follow-up-now severe AS  . Coronary Artery Disease    No angina    HPI:    Victor [name] ("Cotton" is a 86 y.o. male with a PMH below who presents today for roughly 51-month follow-up for history of CAD and aortic stenosis.   He has known CAD s/p PCI to RCA in 2008. Echo at that time showed EF >55% with moderate calcification of the aortic valve leaflets.   Echo in 2012: Mild-Mod AS.   CATH 2013 for positive EKG (inferior TWI) with negative nuclear stress test. No ischemic symptoms at that time.   Patent RCA stent w/ progression of pRCA Dz to ~50-60%. Progression of oD3 lesion to 60-70% - not optimal for PCI (b/c ostial).  EF 55-60% - no RWMA. Med Rx.  Aortic valve gradient: Peak 22 mmHg, mean 13 mmHg  Echo & Myoview in 08/2014 - reviewed in [place] - persistent Mod AS. Normal Nuc   2 D Echo 05/29/17: Normal LV size and function.  EF 60-65%.  GR 1 DD.  Moderate aortic stenosis with estimated valve area between 0.88-0.97 cm.  (Mean gradient 25 mmHg, peak gradient 43 mmHg) --> as of 2021-Echo shows mean gradient of 42 million mercury showing significant progression.  Victor [name] was last seen via telemedicine on July 03, 2018 -> at that time he is doing very well, was just been moaning the fact that the gym was closed so is not able to get this amount of exercise that he was doing.  Was reluctant to go outside to walk.  He did realize that he is now more tired with routine activities than before.  A little bit more fatigued near the end of exercise.  Not having chest pain or pressure no dyspnea with rest or exertion.  Doing well off of Plavix with no bleeding. -> He is also limited by musculoskeletal back pain etc.  Recent Hospitalizations: None  Reviewed  CV studies:    The  following studies were reviewed today: (if available, images/films reviewed: From Epic Chart or Care Everywhere) . Echo 03/2019: Mild LVH.  Normal EF with normal wall motion.  EF 60 to 65%.  GR 1 DD.  Normal RV size and function.  Severe LA dilation.  Moderate to moderate TR.  Severely calcified aortic valve with Severe AS (mean gradient 42 mmHg).  Mild to moderately increased PA pressures.    Interval History:   Victor [name] ("Cotton") is here again for follow-up still doing very well.  He is happy to announce that he is now back at the gym.  He exercises at least 3 days a week with his "workouts.  He does full body exercises on the machines with some light free weights.  He then does 15 to 20 minutes on the stationary bicycle.  He also then walks most days for least a mile or 2 a day. With all his activity he does not really notice any chest pain or pressure with rest or exertion.  He does get tired a bit sooner than he usually would, but this is not a rapid progression. No syncope or near syncope.  No CHF symptoms.  CV Review of Symptoms (Summary): no chest pain or dyspnea on exertion positive for - Little more fatigued  than usual. negative for - irregular heartbeat, orthopnea, palpitations, paroxysmal nocturnal dyspnea, rapid heart rate, shortness of breath or Syncope/near syncope, TIA/amaurosis fugax. No claudication or bleeding issues-melena, hematochezia, hematuria epistaxis.  The patient does not have symptoms concerning for COVID-19 infection (fever, chills, cough, or new shortness of breath).  The patient is practicing social distancing & Masking.    REVIEWED OF SYSTEMS   Review of Systems  Constitutional: Negative for malaise/fatigue (Just gets worn out earlier on during exercise.) and weight loss.  HENT: Negative for congestion.   Respiratory: Negative for shortness of breath.   Gastrointestinal: Negative for blood in stool.  Genitourinary: Negative for hematuria.    Musculoskeletal: Positive for joint pain (Mild aches and pains, not limiting.).  Neurological: Negative for dizziness and headaches.  Endo/Heme/Allergies: Negative for environmental allergies.  Psychiatric/Behavioral: Positive for memory loss (Mild). Negative for substance abuse. The patient is not nervous/anxious.    I have reviewed and (if needed) personally updated the patient's problem list, medications, allergies, past medical and surgical history, social and family history.   PAST MEDICAL HISTORY   Past Medical History:  Diagnosis Date  . Angina   . Aortic valve stenosis, moderate 08/2014   Progression to moderate-severe stenosis: Echo in June 2018 showed mean gradient 30 mmHg, peak gradient 57 mmHg.  . Arthritis   . Bilateral carotid artery disease ([place])    CAROTID DOPPLER,05/21/2008 - Right and left ICA-0-49% diameter reduction, left CCA-0-49% diameter reductiion  . BPH (benign prostatic hypertrophy)   . CAD S/P percutaneous coronary angioplasty 07/2006   PCI to RCA - Promus DES 2.5 mm x 23 mm; 2D ECHO - EF >55%, moderate calcification of the aortic valve leaflets  . Diabetes mellitus   . GERD (gastroesophageal reflux disease)   . High cholesterol   . Hypertension    "from the diabetes"    PAST SURGICAL HISTORY   Past Surgical History:  Procedure Laterality Date  . CARDIAC CATHETERIZATION with PCI  08/27/2006   RCA-mid - 2.5x86mm Promus stent  . CATARACT EXTRACTION W/PHACO Right 03/08/2015   Procedure: CATARACT EXTRACTION PHACO AND INTRAOCULAR LENS PLACEMENT (IOC);  Surgeon: Estill Cotta, MD;  Location: [facility] ORS;  Service: Ophthalmology;  Laterality: Right;  [place]: 01:29.4   . COLECTOMY  ~ 2000  . LEFT HEART CATHETERIZATION WITH CORONARY ANGIOGRAM N/A 03/22/2011   Procedure: LEFT HEART CATHETERIZATION WITH CORONARY ANGIOGRAM;  Surgeon: Leonie Man, MD;  Location: [facility] CATH LAB;  Service: Cardiovascular::: Patent RCA stent w/ progression of pRCA Dz to ~50-60%. Progression  of oD3 lesion to 60-70% - not optimal for PCI (b/c ostial).  EF 55-60% - no RWMA. Med Rx.  Aortic valve gradient: Peak 22 mmHg, mean 13 mmHg  . NM MYOVIEW LTD  08/2014   LOW RISK. NORMAL.  EF 45-54%.   . TRANSTHORACIC ECHOCARDIOGRAM  04/2017   Normal LV size and function.  EF 60-65%.  GR 1 DD.  Moderate aortic stenosis with estimated valve area between 0.88-0.97 cm.  (Mean gradient 25 mmHg, peak gradient 43 mmHg)  . TRANSTHORACIC ECHOCARDIOGRAM  08/26/2015   Normal LV size with mild concentric hypertrophy. Normal EF 55-60%. No RWMA. GR 1 DD. Severe aortic stenosis (mean gradient 24 mmHg, AVA 0.75 cm) --> progression from 2016. Mild-moderate MR. Mildly dilated RV. Mildly elevated PA pressures (40 mmHg.  Marland Kitchen TRANSTHORACIC ECHOCARDIOGRAM  06/2016   EF 60-65%. GR 1 DD. Moderate to severe aortic stenosis with a mean gradient 30 mmHg, peak gradient of 57 mmHg.   Marland Kitchen  TRANSURETHRAL RESECTION OF PROSTATE  ~ 2010    MEDICATIONS/ALLERGIES   Current Meds  Medication Sig  . aspirin EC 81 MG tablet Take 81 mg by mouth daily.  . calcium carbonate (OS-CAL) 600 MG TABS tablet Take 600 mg by mouth daily.  . cetirizine (ZYRTEC) 10 MG tablet Take 10 mg by mouth daily.  . Cholecalciferol (VITAMIN D3) 2000 UNITS TABS Take 1 tablet by mouth daily.  . Coenzyme Q10 (CO Q 10 PO) Take by mouth daily.  . cyclobenzaprine (FLEXERIL) 10 MG tablet Take 10 mg by mouth daily as needed.  . donepezil (ARICEPT) 10 MG tablet Take 10 mg by mouth daily.  Marland Kitchen EPIPEN 2-PAK 0.3 MG/0.3ML SOAJ injection Inject 0.3 mLs as directed as directed.  . ezetimibe (ZETIA) 10 MG tablet Take 10 mg by mouth daily.  . fluticasone (FLONASE) 50 MCG/ACT nasal spray Place 2 sprays into both nostrils as needed.   . gabapentin (NEURONTIN) 100 MG capsule Take 200 mg by mouth at bedtime.  Marland Kitchen glipiZIDE (GLUCOTROL XL) 2.5 MG 24 hr tablet Take 2.5 mg by mouth daily with breakfast.  . metoprolol succinate (TOPROL-XL) 25 MG 24 hr tablet Take 1 tablet by mouth once  daily  . Misc Natural Products (OSTEO BI-FLEX ADV JOINT SHIELD PO) Take 1 tablet by mouth daily.  . Multiple Vitamin (MULITIVITAMIN WITH MINERALS) TABS Take 1 tablet by mouth daily.  Marland Kitchen MYRBETRIQ 50 MG TB24 tablet Take 50 mg by mouth daily.   . Omega 3 1200 MG CAPS Take 1,200 mg by mouth 2 (two) times daily.  . Saw Palmetto 450 MG CAPS Take 2 capsules by mouth daily.  . simvastatin (ZOCOR) 40 MG tablet Take 40 mg by mouth at bedtime.  . Tamsulosin HCl (FLOMAX) 0.4 MG CAPS Take 0.4 mg by mouth daily. Take 30 minutes after same meal each day.  . vitamin B-12 (CYANOCOBALAMIN) 1000 MCG tablet Take 1,000 mcg by mouth daily.    Allergies  Allergen Reactions  . Azithromycin Rash and Anaphylaxis  . Cephalosporins Other (See Comments) and Anaphylaxis    unknown  . Codeine Itching, Rash and Anaphylaxis    Other reaction(s): Unknown  . Doxycycline Anaphylaxis  . Oraxyl [Doxycycline Hyclate] Swelling    "started swelling in my mouth & tongue; had to go to emergency room; really bad reaction"  . Doxycycline Hyclate     Other reaction(s): Unknown  . Phenylephrine-Guaifenesin     Other reaction(s): Other (See Comments)  . Pseudoephedrine Other (See Comments)    unknown Other reaction(s): Unknown Other reaction(s): Unknown Other reaction(s): Unknown   . Amoxicillin Rash  . Levofloxacin Rash  . Levofloxacin Rash  . Penicillins Rash    Other reaction(s): Unknown  . Sulfa Antibiotics Rash  . Sulfasalazine Rash    SOCIAL HISTORY/FAMILY HISTORY   Reviewed in Epic:  Pertinent findings: N/A  OBJCTIVE -PE, EKG, labs   Wt Readings from Last 3 Encounters:  04/15/19 172 lb 12.8 oz (78.4 kg)  01/01/18 169 lb 12.8 oz (77 kg)  06/21/17 167 lb 6.4 oz (75.9 kg)    Physical Exam: BP 115/82   Pulse 86   Temp (!) 95.7 F (35.4 C)   Ht 5\' 8"  (1.727 m)   Wt 172 lb 12.8 oz (78.4 kg)   SpO2 96%   BMI 26.27 kg/m  Physical Exam  Constitutional: He is oriented to person, place, and time. He  appears well-developed and well-nourished. No distress.  Healthy-appearing.  Well-groomed.  Appears younger than stated age.  HENT:  Head: Normocephalic and atraumatic.  Neck: Decreased carotid pulses present. No hepatojugular reflux and no JVD present. Carotid bruit is not present (Probably more radiated aortic murmur).  Cardiovascular: Normal rate, regular rhythm, S1 normal, S2 normal and intact distal pulses.  Occasional extrasystoles are present. PMI is not displaced. Exam reveals no gallop, no distant heart sounds and no friction rub.  Murmur heard. High-pitched harsh crescendo-decrescendo midsystolic murmur is present with a grade of 3/6 at the upper right sternal border radiating to the neck. Pulmonary/Chest: Effort normal and breath sounds normal. No respiratory distress. He has no wheezes. He has no rales.  Musculoskeletal:        General: No edema. Normal range of motion.     Cervical back: Normal range of motion and neck supple.  Neurological: He is alert and oriented to person, place, and time.  Psychiatric: He has a normal mood and affect. His behavior is normal. Judgment and thought content normal.  Vitals reviewed.   Adult ECG Report N/a  Recent Labs:   From Care Everywhere March 25, 2019  TC 149, TG 137, HDL 55, LDL 67.  K+ 4.7. Cr 0.9.  Normal LFTs.  CO2 32, glucose 133.  ASSESSMENT/PLAN    Problem List Items Addressed This Visit    Severe calcific aortic valve stenosis - Primary (Chronic)    Pretty significant progression of disease from 2019 and now. With now severe AS, we will follow-up echo in 6 months.  He does not indicate having any symptoms, but last time I saw him he mentioned little bit about fatigue.  We reiterated the importance of monitoring for symptoms of angina, CHF and syncope/near syncope.  Plan: Continue aspirin, beta-blocker and statin plus Zetia.  Check 2D echo follow-up in 6 months  Pending results, low threshold to consider initiating  preop evaluation for TAVR.      Relevant Orders   ECHOCARDIOGRAM COMPLETE   Essential hypertension (Chronic)    Blood pressure looks great on current dose of metoprolol.  Would not likely have room for afterload reduction.      Hyperlipidemia with target LDL less than 70 (Chronic)    Has both CAD and aortic stenosis.  Is on combination of Zocor plus Zetia (insurance would not cover Vytorin.).  Labs have been relatively within normal goal limits.  Followed by PCP.      CAD S/P percutaneous coronary angioplasty --> PCI RCA Promus DES 2.5 mm x 23 mm (Chronic)    Doing well with no active angina.  Negative Myoview in 2016. Doing well off of Plavix without any bleeding.  Remains on maintenance dose aspirin 81 mg. Is on low-dose Toprol plus the equivalent of Vytorin.  I suspect that he would probably need RIGHT AND LEFT HEART CATH once his aortic stenosis progresses.      Bradycardia (Chronic)    He is very active and exercises quite a bit.  Resting heart rate is 86 today.  He clearly increase his heart rate as necessary.  He is only on low-dose Toprol.  Not able to titrate higher.      Atypical angina (HCC)    This discomfort was evaluated with a negative Myoview.  At that time, the valve was not to this level of significance.  At this point, further symptoms that concern for possible angina would probably warrant progression to referral for TAVR.          COVID-19 Education: The signs and symptoms of COVID-19 were discussed with  the patient and how to seek care for testing (follow up with PCP or arrange E-visit).   The importance of social distancing was discussed today.  I spent a total of 88minutes with the patient. >  50% of the time was spent in direct patient consultation.  Additional time spent with chart review  / charting (studies, outside notes, etc): 6 Total Time: 30 min   Current medicines are reviewed at length with the patient today.  (+/- concerns)  n/a   Patient Instructions / Medication Changes & Studies & Tests Ordered   Patient Instructions  Medication Instructions:  No change *If you need a refill on your cardiac medications before your next appointment, please call your pharmacy*   Lab Work: Not needed    Testing/Procedures: Will be schedule at [address]-- Sept 2021 Your physician has requested that you have an echocardiogram. Echocardiography is a painless test that uses sound waves to create images of your heart. It provides your doctor with information about the size and shape of your heart and how well your heart's chambers and valves are working. This procedure takes approximately one hour. There are no restrictions for this procedure.     Follow-Up: At [facility], you and your health needs are our priority.  As part of our continuing mission to provide you with exceptional heart care, we have created designated Provider Care Teams.  These Care Teams include your primary Cardiologist (physician) and Advanced Practice Providers (APPs -  Physician Assistants and Nurse Practitioners) who all work together to provide you with the care you need, when you need it.  We recommend signing up for the patient portal called "MyChart".  Sign up information is provided on this After Visit Summary.  MyChart is used to connect with patients for Virtual Visits (Telemedicine).  Patients are able to view lab/test results, encounter notes, upcoming appointments, etc.  Non-urgent messages can be sent to your provider as well.   To learn more about what you can do with MyChart, go to [email or web address].    Your next appointment:   6 month(s)- Sept 2021  The format for your next appointment:   In Person  Provider:   [name], MD   Other Instructions Contact office if you have symptoms of shortness of breath, swelling ,Chest discomfort  That is noticeable and changes your daily routine.      Studies Ordered:   Orders Placed This Encounter  Procedures  . ECHOCARDIOGRAM COMPLETE     [name], M.D., M.S. Interventional Cardiologist   Pager # [phone] Phone # [phone] [address] [place], [place] [address]   Thank you for choosing Heartcare at [facility]!!

## 2019-04-18 ENCOUNTER — Encounter: Payer: Self-pay | Admitting: Cardiology

## 2019-04-18 NOTE — Assessment & Plan Note (Signed)
Has both CAD and aortic stenosis.  Is on combination of Zocor plus Zetia (insurance would not cover Vytorin.).  Labs have been relatively within normal goal limits.  Followed by PCP.

## 2019-04-18 NOTE — Assessment & Plan Note (Signed)
He is very active and exercises quite a bit.  Resting heart rate is 86 today.  He clearly increase his heart rate as necessary.  He is only on low-dose Toprol.  Not able to titrate higher.

## 2019-04-18 NOTE — Assessment & Plan Note (Signed)
This discomfort was evaluated with a negative Myoview.  At that time, the valve was not to this level of significance.  At this point, further symptoms that concern for possible angina would probably warrant progression to referral for TAVR.

## 2019-04-18 NOTE — Assessment & Plan Note (Signed)
Doing well with no active angina.  Negative Myoview in 2016. Doing well off of Plavix without any bleeding.  Remains on maintenance dose aspirin 81 mg. Is on low-dose Toprol plus the equivalent of Vytorin.  I suspect that he would probably need RIGHT AND LEFT HEART CATH once his aortic stenosis progresses.

## 2019-04-18 NOTE — Assessment & Plan Note (Addendum)
Pretty significant progression of disease from 2019 and now. With now severe AS, we will follow-up echo in 6 months.  He does not indicate having any symptoms, but last time I saw him he mentioned little bit about fatigue.  We reiterated the importance of monitoring for symptoms of angina, CHF and syncope/near syncope.  Plan: Continue aspirin, beta-blocker and statin plus Zetia.  Check 2D echo follow-up in 6 months  Pending results, low threshold to consider initiating preop evaluation for TAVR.

## 2019-04-18 NOTE — Assessment & Plan Note (Signed)
Blood pressure looks great on current dose of metoprolol.  Would not likely have room for afterload reduction.

## 2019-04-29 ENCOUNTER — Other Ambulatory Visit: Payer: Self-pay

## 2019-04-29 ENCOUNTER — Ambulatory Visit (INDEPENDENT_AMBULATORY_CARE_PROVIDER_SITE_OTHER): Payer: Medicare Other | Admitting: Podiatry

## 2019-04-29 DIAGNOSIS — B351 Tinea unguium: Secondary | ICD-10-CM

## 2019-04-29 DIAGNOSIS — L989 Disorder of the skin and subcutaneous tissue, unspecified: Secondary | ICD-10-CM | POA: Diagnosis not present

## 2019-04-29 DIAGNOSIS — M79676 Pain in unspecified toe(s): Secondary | ICD-10-CM

## 2019-04-30 NOTE — Progress Notes (Signed)
    Subjective: Patient is a 86 y.o. male presenting to the office today as a new patient with a chief complaint of painful callus lesion(s) noted to the right 2nd toe that appeared about three weeks ago. He reports associated redness and swelling. Walking increases the pain. He has been applying Neosporin to the areas.  Patient also complains of elongated, thickened nails that cause pain while ambulating in shoes. He is unable to trim his own nails. Patient presents today for further treatment and evaluation.   Past Medical History:  Diagnosis Date  . Angina   . Aortic valve stenosis, moderate 08/2014   Progression to moderate-severe stenosis: Echo in June 2018 showed mean gradient 30 mmHg, peak gradient 57 mmHg.  . Arthritis   . Bilateral carotid artery disease ([place])    CAROTID DOPPLER,05/21/2008 - Right and left ICA-0-49% diameter reduction, left CCA-0-49% diameter reductiion  . BPH (benign prostatic hypertrophy)   . CAD S/P percutaneous coronary angioplasty 07/2006   PCI to RCA - Promus DES 2.5 mm x 23 mm; 2D ECHO - EF >55%, moderate calcification of the aortic valve leaflets  . Diabetes mellitus   . GERD (gastroesophageal reflux disease)   . High cholesterol   . Hypertension    "from the diabetes"    Objective:  Physical Exam General: Alert and oriented x3 in no acute distress  Dermatology: Hyperkeratotic lesion(s) present on the right 2nd toe. Pain on palpation with a central nucleated core noted. Skin is warm, dry and supple bilateral lower extremities. Negative for open lesions or macerations. Nails are tender, long, thickened and dystrophic with subungual debris, consistent with onychomycosis, 1-5 bilateral. No signs of infection noted.  Vascular: Palpable pedal pulses bilaterally. No edema or erythema noted. Capillary refill within normal limits.  Neurological: Epicritic and protective threshold diminished bilaterally.   Musculoskeletal Exam: Pain on palpation at the  keratotic lesion(s) noted. Range of motion within normal limits bilateral. Muscle strength 5/5 in all groups bilateral.  Assessment: 1. Onychodystrophic nails 1-5 bilateral with hyperkeratosis of nails.  2. Onychomycosis of nail due to dermatophyte bilateral 3. Pre-ulcerative callus lesion noted to the right 2nd toe    Plan of Care:  1. Patient evaluated. 2. Excisional debridement of keratoic lesion(s) using a chisel blade was performed without incident.  3. Dressed with light dressing. 4. Mechanical debridement of nails 1-5 bilaterally performed using a nail nipper. Filed with dremel without incident.  5. Patient is to return to the clinic in 3 months.   Edrick Kins, DPM [facility]  Dr. Edrick Kins, [address]                                        [place], [place] [address]                Office [phone]  Fax [phone]

## 2019-05-05 ENCOUNTER — Other Ambulatory Visit: Payer: Self-pay | Admitting: Cardiology

## 2019-05-09 ENCOUNTER — Other Ambulatory Visit: Payer: Self-pay

## 2019-05-09 MED ORDER — METOPROLOL SUCCINATE ER 25 MG PO TB24: 25.0000 mg | ORAL_TABLET | Freq: Every day | ORAL | 3 refills | Status: DC

## 2019-05-12 ENCOUNTER — Other Ambulatory Visit: Payer: Self-pay

## 2019-05-27 ENCOUNTER — Telehealth: Payer: Self-pay | Admitting: Cardiology

## 2019-05-27 NOTE — Telephone Encounter (Signed)
Left message for patient to find out what procedure he is wanting to schedule.

## 2019-05-27 NOTE — Telephone Encounter (Signed)
New Message:     Pt wanted to let the nurse know, he is ready to schedule his procedure for June.

## 2019-05-29 NOTE — Telephone Encounter (Signed)
Called Victor [name] to discuss which procedure he is needing.  Left call back number.

## 2019-05-29 NOTE — Telephone Encounter (Signed)
Follow up  Pt returning call to discus schedule his procedure

## 2019-05-31 DIAGNOSIS — I350 Nonrheumatic aortic (valve) stenosis: Secondary | ICD-10-CM

## 2019-05-31 HISTORY — DX: Nonrheumatic aortic (valve) stenosis: I35.0

## 2019-06-03 ENCOUNTER — Telehealth: Payer: Self-pay | Admitting: Cardiology

## 2019-06-03 NOTE — Telephone Encounter (Signed)
Spoke with patient . Informed patient will need to discuss with Dr [name] the next step- ie testing ( ?Echo , ?cath and office appt)  Patient aware will contact him once  It is finalized with Dr [name].

## 2019-06-03 NOTE — Telephone Encounter (Signed)
Patient states that he is ready to schedule his valve replacement surgery. He is looking to do it in June. He states that he is weak and feels like he is ready to go forward with the surgery.

## 2019-06-03 NOTE — Telephone Encounter (Signed)
See note on 06/03/19

## 2019-06-03 NOTE — Telephone Encounter (Signed)
So what we should do is ask him to come to come in to get an echocardiogram and be seen by a PA/NP to get set up for right and left heart cath.  [name], MD

## 2019-06-04 NOTE — Telephone Encounter (Signed)
Called spoke patient's wife Gwinda Passe. Patient was not home. Per Dr [name],  Echo  Will be done in the next few weeks , schedule a office visit with extender to schedule Right and left heart cath with Dr [name].  Wife verbalized understanding and aware the patient can call back if he has question.

## 2019-06-12 ENCOUNTER — Ambulatory Visit (HOSPITAL_COMMUNITY)
Admission: RE | Admit: 2019-06-12 | Discharge: 2019-06-12 | Disposition: A | Discharge status: Home / Self Care | Payer: Medicare Other | Source: Ambulatory Visit | Attending: Cardiology | Admitting: Cardiology

## 2019-06-12 ENCOUNTER — Other Ambulatory Visit: Payer: Self-pay

## 2019-06-12 DIAGNOSIS — I2510 Atherosclerotic heart disease of native coronary artery without angina pectoris: Secondary | ICD-10-CM | POA: Insufficient documentation

## 2019-06-12 DIAGNOSIS — E119 Type 2 diabetes mellitus without complications: Secondary | ICD-10-CM | POA: Diagnosis not present

## 2019-06-12 DIAGNOSIS — E785 Hyperlipidemia, unspecified: Secondary | ICD-10-CM | POA: Diagnosis not present

## 2019-06-12 DIAGNOSIS — I350 Nonrheumatic aortic (valve) stenosis: Secondary | ICD-10-CM | POA: Diagnosis present

## 2019-06-12 DIAGNOSIS — I119 Hypertensive heart disease without heart failure: Secondary | ICD-10-CM | POA: Insufficient documentation

## 2019-06-12 NOTE — Progress Notes (Signed)
Echocardiogram 2D Echocardiogram has been performed.  Oneal Deputy Neng Albee 06/12/2019, 11:09 AM

## 2019-06-16 NOTE — Progress Notes (Signed)
Cardiology Office Note   Date:  06/18/2019   ID:  DWIJ FOSHEE, DOB Apr 18, 1931, MRN [id number]  PCP:  Victor Hartigan, MD  Cardiologist: Dr. Ellyn [name]  CC: Follow-up echocardiogram discussed cardiac cath   History of Present Illness: Victor [name] is a 86 y.o. male who presents for ongoing assessment and management of coronary artery disease, with PCI to the RCA in 2008, most recent cardiac catheterization in 2013 with patent RCA stent with progression of proximal RCA disease to 56%, progression of diagonal 3 lesion to 67%, not optimal for PCI (because ostial).  When last seen by Dr. Ellyn [name] on 04/15/2019 Dr. Ellyn [name] noted that his aortic valve stenosis had progressed to severe AS.  He did not have any symptoms at that time.  No chest pain, dizziness, dyspnea on exertion.  He did have some mild fatigue.  He is due to have a repeat echocardiogram on this office visit for ongoing surveillance of aortic valve.  The patient was doing well off of Plavix and is now on low-dose Toprol plus equivalent of Vytorin, and is been continued on 81 mg of aspirin daily.  Victor [name] did have a repeat echocardiogram dated 06/12/2019.  Aortic valve was found to be tricuspid regurgitation was not visualized however he did have severe aortic valve stenosis aortic valve area, by VTI measuring 0.49 cm.  Aortic valve mean gradient measures 52.5 mmHg.  Aortic valve V-max measured 4.56 m/s.  He will need to have a cardiac catheterization discussed with him today and planned to evaluate aortic valve more closely before referral to have repair.  He comes today with his son in attendance.  Victor [name] is normally very active goes to the gym walks but has had no energy lately.  He does have some dyspnea on exertion but no significant chest pain.  He is medically compliant.  Past Medical History:  Diagnosis Date  . Angina   . Aortic valve stenosis, moderate 08/2014   Progression to moderate-severe stenosis: Echo in June 2018  showed mean gradient 30 mmHg, peak gradient 57 mmHg.  . Arthritis   . Bilateral carotid artery disease ([place])    CAROTID DOPPLER,05/21/2008 - Right and left ICA-0-49% diameter reduction, left CCA-0-49% diameter reductiion  . BPH (benign prostatic hypertrophy)   . CAD S/P percutaneous coronary angioplasty 07/2006   PCI to RCA - Promus DES 2.5 mm x 23 mm; 2D ECHO - EF >55%, moderate calcification of the aortic valve leaflets  . Diabetes mellitus   . GERD (gastroesophageal reflux disease)   . High cholesterol   . Hypertension    "from the diabetes"    Past Surgical History:  Procedure Laterality Date  . CARDIAC CATHETERIZATION with PCI  08/27/2006   RCA-mid - 2.5x74mm Promus stent  . CATARACT EXTRACTION W/PHACO Right 03/08/2015   Procedure: CATARACT EXTRACTION PHACO AND INTRAOCULAR LENS PLACEMENT (IOC);  Surgeon: Estill Cotta, MD;  Location: [facility] ORS;  Service: Ophthalmology;  Laterality: Right;  [place]: 01:29.4   . COLECTOMY  ~ 2000  . LEFT HEART CATHETERIZATION WITH CORONARY ANGIOGRAM N/A 03/22/2011   Procedure: LEFT HEART CATHETERIZATION WITH CORONARY ANGIOGRAM;  Surgeon: Leonie Man, MD;  Location: [facility] CATH LAB;  Service: Cardiovascular::: Patent RCA stent w/ progression of pRCA Dz to ~50-60%. Progression of oD3 lesion to 60-70% - not optimal for PCI (b/c ostial).  EF 55-60% - no RWMA. Med Rx.  Aortic valve gradient: Peak 22 mmHg, mean 13 mmHg  . NM MYOVIEW LTD  08/2014  LOW RISK. NORMAL.  EF 45-54%.   . TRANSTHORACIC ECHOCARDIOGRAM  04/2017   Normal LV size and function.  EF 60-65%.  GR 1 DD.  Moderate aortic stenosis with estimated valve area between 0.88-0.97 cm.  (Mean gradient 25 mmHg, peak gradient 43 mmHg)  . TRANSTHORACIC ECHOCARDIOGRAM  08/26/2015   Normal LV size with mild concentric hypertrophy. Normal EF 55-60%. No RWMA. GR 1 DD. Severe aortic stenosis (mean gradient 24 mmHg, AVA 0.75 cm) --> progression from 2016. Mild-moderate MR. Mildly dilated RV. Mildly elevated PA  pressures (40 mmHg.  Marland Kitchen TRANSTHORACIC ECHOCARDIOGRAM  06/2016   EF 60-65%. GR 1 DD. Moderate to severe aortic stenosis with a mean gradient 30 mmHg, peak gradient of 57 mmHg.   Marland Kitchen TRANSURETHRAL RESECTION OF PROSTATE  ~ 2010     Current Outpatient Medications  Medication Sig Dispense Refill  . aspirin EC 81 MG tablet Take 81 mg by mouth daily.    . calcium carbonate (OS-CAL) 600 MG TABS tablet Take 600 mg by mouth daily.    . cetirizine (ZYRTEC) 10 MG tablet Take 10 mg by mouth daily.    . Cholecalciferol (VITAMIN D3) 2000 UNITS TABS Take 1 tablet by mouth daily.    . Coenzyme Q10 (CO Q 10 PO) Take by mouth daily.    . cyclobenzaprine (FLEXERIL) 10 MG tablet Take 10 mg by mouth daily as needed.    . donepezil (ARICEPT) 10 MG tablet Take 10 mg by mouth daily.    Marland Kitchen EPIPEN 2-PAK 0.3 MG/0.3ML SOAJ injection Inject 0.3 mLs as directed as directed.    . ezetimibe (ZETIA) 10 MG tablet Take 10 mg by mouth daily.    . fluticasone (FLONASE) 50 MCG/ACT nasal spray Place 2 sprays into both nostrils as needed.     . gabapentin (NEURONTIN) 100 MG capsule Take 200 mg by mouth at bedtime.    Marland Kitchen glipiZIDE (GLUCOTROL XL) 2.5 MG 24 hr tablet Take 2.5 mg by mouth daily with breakfast.    . metoprolol succinate (TOPROL-XL) 25 MG 24 hr tablet Take 1 tablet (25 mg total) by mouth daily. 90 tablet 3  . Misc Natural Products (OSTEO BI-FLEX ADV JOINT SHIELD PO) Take 1 tablet by mouth daily.    . Multiple Vitamin (MULITIVITAMIN WITH MINERALS) TABS Take 1 tablet by mouth daily.    . Omega 3 1200 MG CAPS Take 1,200 mg by mouth 2 (two) times daily.    . Saw Palmetto 450 MG CAPS Take 2 capsules by mouth daily.    . simvastatin (ZOCOR) 40 MG tablet Take 40 mg by mouth at bedtime.    . vitamin B-12 (CYANOCOBALAMIN) 1000 MCG tablet Take 1,000 mcg by mouth daily.     No current facility-administered medications for this visit.    Allergies:   Azithromycin, Cephalosporins, Codeine, Doxycycline, Oraxyl [doxycycline hyclate],  Doxycycline hyclate, Phenylephrine-guaifenesin, Pseudoephedrine, Amoxicillin, Levofloxacin, Levofloxacin, Penicillins, Sulfa antibiotics, and Sulfasalazine    Social History:  The patient  reports that he has never smoked. He quit smokeless tobacco use about 23 years ago.  His smokeless tobacco use included chew. He reports current alcohol use of about 6.0 standard drinks of alcohol per week. He reports that he does not use drugs.   Family History:  The patient's Family history is unknown by patient.    ROS: All other systems are reviewed and negative. Unless otherwise mentioned in H&P    PHYSICAL EXAM: VS:  BP 118/60   Pulse (!) 40   Ht 5'  8" (1.727 m)   Wt 167 lb 3.2 oz (75.8 kg)   SpO2 95%   BMI 25.42 kg/m  , BMI Body mass index is 25.42 kg/m. GEN: Well nourished, well developed, in no acute distress HEENT: normal Neck: no JVD, carotid bruits, or masses Cardiac: RRR 2/6 holosystolic murmur with preserved S2 heard best at the right sternal border and apex; , rubs, or gallops,no edema  Respiratory:  Clear to auscultation bilaterally, normal work of breathing GI: soft, nontender, nondistended, + BS MS: no deformity or atrophy Skin: warm and dry, no rash Neuro:  Strength and sensation are intact Psych: euthymic mood, full affect   EKG: Not completed this office visit.  Please see prior EKG  Recent Labs: No results found for requested labs within last 8760 hours.    Lipid Panel    Component Value Date/Time   CHOL 127 03/22/2011 0545   TRIG 133 03/22/2011 0545   HDL 48 03/22/2011 0545   CHOLHDL 2.6 03/22/2011 0545   VLDL 27 03/22/2011 0545   LDLCALC 52 03/22/2011 0545      Wt Readings from Last 3 Encounters:  06/18/19 167 lb 3.2 oz (75.8 kg)  04/15/19 172 lb 12.8 oz (78.4 kg)  01/01/18 169 lb 12.8 oz (77 kg)    Echocardiogram 06/12/2019 1. Since prior ECHO, aortic stenosis has increased to severe.  2. Left ventricular ejection fraction, by estimation, is 60 to  65%. The  left ventricle has normal function. The left ventricle has no regional  wall motion abnormalities. There is mild asymmetric left ventricular  hypertrophy of the basal-septal segment.  Left ventricular diastolic parameters are consistent with Grade I  diastolic dysfunction (impaired relaxation). Elevated left atrial  pressure.  3. Right ventricular systolic function is normal. The right ventricular  size is normal. There is normal pulmonary artery systolic pressure. The  estimated right ventricular systolic pressure is [id number] mmHg.  4. Left atrial size was moderately dilated.  5. The mitral valve is normal in structure. Moderate mitral valve  regurgitation. No evidence of mitral stenosis.  6. Tricuspid valve regurgitation is moderate.  7. The aortic valve is tricuspid. Aortic valve regurgitation is not  visualized. Severe aortic valve stenosis. Aortic valve area, by VTI  measures 0.49 cm. Aortic valve mean gradient measures 52.5 mmHg. Aortic  valve Vmax measures 4.56 m/s.  8. The inferior vena cava is normal in size with greater than 50%  respiratory variability, suggesting right atrial pressure of 3 mmHg.   Other studies Reviewed: (copied from Dr. Allison Quarry note for accuracy.).   He has known CAD s/p PCI to RCA in 2008. Echo at that time showed EF >55% with moderate calcification of the aortic valve leaflets.   Echo in 2012: Mild-Mod AS.   CATH 2013 for positive EKG (inferior TWI) with negative nuclear stress test. No ischemic symptoms at that time.   Patent RCA stent w/ progression of pRCA Dz to ~50-60%. Progression of oD3 lesion to 60-70% - not optimal for PCI (b/c ostial). EF 55-60% - no RWMA. Med Rx. Aortic valve gradient: Peak 22 mmHg, mean 13 mmHg  Echo &Myoview in 08/2014 - reviewed in PMH - persistent Mod AS. Normal Nuc   2 D Echo 05/29/17: Normal LV size and function. EF 60-65%. GR 1 DD. Moderate aortic stenosis with estimated valve area between 0.88-0.97  cm. (Mean gradient 25 mmHg, peak gradient 43 mmHg) --> as of 2021-Echo shows mean gradient of 42 million mercury showing significant progression.   ASSESSMENT  AND PLAN:  1.  Severe aortic valve stenosis: Symptomatic.  More fatigue and dyspnea.  Unable to go to the gym or complete daily activities without significant lack of energy. Aortic valve area, by VTI measuring 0.49 cm.  Aortic valve mean gradient measures 52.5 mmHg.  Aortic valve V-max measured 4.56 m/s per echocardiogram on 06/12/2019.  I have discussed with the patient and his son the need to undergo cardiac catheterization to evaluate for coronary artery disease progression, and prepare him for possible TAVR.  I have discussed the risks and benefits and the procedure in detail.  The patient understands that risks include but are not limited to stroke (1 in 1000), death (1 in 38), kidney failure [usually temporary] (1 in 500), bleeding (1 in 200), allergic reaction [possibly serious] (1 in 200), and agrees to proceed.   He is scheduled for May 26 at 10 AM with Dr. Ellyn [name].  All questions have been answered and explanations given.  They are willing to proceed.  2.  Coronary artery disease: History of stent to the right coronary artery with progression of proximal RCA disease to 56% per cardiac catheterization in 2013.  Progression of diagonal 3 lesion to 67% but not optimal for PCI because it was ostial.  Cardiac catheterization as discussed above.  3.  Type 2 diabetes: Followed by PCP.  Will hold glipizide in the a.m. of the catheterization.  4.  Hyperlipidemia: Goal of LDL less than 70 with known history of CAD.   Current medicines are reviewed at length with the patient today.  I have spent 45 mnutes dedicated to the care of this patient on the date of this encounter to include pre-visit review of records, assessment, management and diagnostic testing,with shared decision making.  Labs/ tests ordered today include: Left heart  cath with course and possible.  [place] labs.  Phill Myron. West Pugh, ANP, AACC   06/18/2019 4:37 PM    [facility] [place] [address] Office [phone] Fax [phone]  Notice: This dictation was prepared with Dragon dictation along with smaller phrase technology. Any transcriptional errors that result from this process are unintentional and may not be corrected upon review.

## 2019-06-16 NOTE — H&P (View-Only) (Signed)
Cardiology Office Note   Date:  06/18/2019   ID:  Victor [name], DOB 08-15-1931, MRN [id number]  PCP:  Sofie Hartigan, MD  Cardiologist: Dr. [name]  CC: Follow-up echocardiogram discussed cardiac cath   History of Present Illness: Victor [name] is a 86 y.o. male who presents for ongoing assessment and management of coronary artery disease, with PCI to the RCA in 2008, most recent cardiac catheterization in 2013 with patent RCA stent with progression of proximal RCA disease to 56%, progression of diagonal 3 lesion to 67%, not optimal for PCI (because ostial).  When last seen by Dr. [name] on 04/15/2019 Dr. [name] noted that his aortic valve stenosis had progressed to severe AS.  He did not have any symptoms at that time.  No chest pain, dizziness, dyspnea on exertion.  He did have some mild fatigue.  He is due to have a repeat echocardiogram on this office visit for ongoing surveillance of aortic valve.  The patient was doing well off of Plavix and is now on low-dose Toprol plus equivalent of Vytorin, and is been continued on 81 mg of aspirin daily.  [name] did have a repeat echocardiogram dated 06/12/2019.  Aortic valve was found to be tricuspid regurgitation was not visualized however he did have severe aortic valve stenosis aortic valve area, by VTI measuring 0.49 cm.  Aortic valve mean gradient measures 52.5 mmHg.  Aortic valve V-max measured 4.56 m/s.  He will need to have a cardiac catheterization discussed with him today and planned to evaluate aortic valve more closely before referral to have repair.  He comes today with his son in attendance.  [name] is normally very active goes to the gym walks but has had no energy lately.  He does have some dyspnea on exertion but no significant chest pain.  He is medically compliant.  Past Medical History:  Diagnosis Date  . Angina   . Aortic valve stenosis, moderate 08/2014   Progression to moderate-severe stenosis: Echo in June 2018  showed mean gradient 30 mmHg, peak gradient 57 mmHg.  . Arthritis   . Bilateral carotid artery disease ([place])    CAROTID DOPPLER,05/21/2008 - Right and left ICA-0-49% diameter reduction, left CCA-0-49% diameter reductiion  . BPH (benign prostatic hypertrophy)   . CAD S/P percutaneous coronary angioplasty 07/2006   PCI to RCA - Promus DES 2.5 mm x 23 mm; 2D ECHO - EF >55%, moderate calcification of the aortic valve leaflets  . Diabetes mellitus   . GERD (gastroesophageal reflux disease)   . High cholesterol   . Hypertension    "from the diabetes"    Past Surgical History:  Procedure Laterality Date  . CARDIAC CATHETERIZATION with PCI  08/27/2006   RCA-mid - 2.5x62mm Promus stent  . CATARACT EXTRACTION W/PHACO Right 03/08/2015   Procedure: CATARACT EXTRACTION PHACO AND INTRAOCULAR LENS PLACEMENT (IOC);  Surgeon: Estill Cotta, MD;  Location: [facility] ORS;  Service: Ophthalmology;  Laterality: Right;  [place]: 01:29.4   . COLECTOMY  ~ 2000  . LEFT HEART CATHETERIZATION WITH CORONARY ANGIOGRAM N/A 03/22/2011   Procedure: LEFT HEART CATHETERIZATION WITH CORONARY ANGIOGRAM;  Surgeon: Leonie Man, MD;  Location: [facility] CATH [facility];  Service: Cardiovascular::: Patent RCA stent w/ progression of pRCA Dz to ~50-60%. Progression of oD3 lesion to 60-70% - not optimal for PCI (b/c ostial).  EF 55-60% - no RWMA. Med Rx.  Aortic valve gradient: Peak 22 mmHg, mean 13 mmHg  . NM MYOVIEW LTD  08/2014  LOW RISK. NORMAL.  EF 45-54%.   . TRANSTHORACIC ECHOCARDIOGRAM  04/2017   Normal LV size and function.  EF 60-65%.  GR 1 DD.  Moderate aortic stenosis with estimated valve area between 0.88-0.97 cm.  (Mean gradient 25 mmHg, peak gradient 43 mmHg)  . TRANSTHORACIC ECHOCARDIOGRAM  08/26/2015   Normal LV size with mild concentric hypertrophy. Normal EF 55-60%. No RWMA. GR 1 DD. Severe aortic stenosis (mean gradient 24 mmHg, AVA 0.75 cm) --> progression from 2016. Mild-moderate MR. Mildly dilated RV. Mildly elevated PA  pressures (40 mmHg.  Marland Kitchen TRANSTHORACIC ECHOCARDIOGRAM  06/2016   EF 60-65%. GR 1 DD. Moderate to severe aortic stenosis with a mean gradient 30 mmHg, peak gradient of 57 mmHg.   Marland Kitchen TRANSURETHRAL RESECTION OF PROSTATE  ~ 2010     Current Outpatient Medications  Medication Sig Dispense Refill  . aspirin EC 81 MG tablet Take 81 mg by mouth daily.    . calcium carbonate (OS-CAL) 600 MG TABS tablet Take 600 mg by mouth daily.    . cetirizine (ZYRTEC) 10 MG tablet Take 10 mg by mouth daily.    . Cholecalciferol (VITAMIN D3) 2000 UNITS TABS Take 1 tablet by mouth daily.    . Coenzyme Q10 (CO Q 10 PO) Take by mouth daily.    . cyclobenzaprine (FLEXERIL) 10 MG tablet Take 10 mg by mouth daily as needed.    . donepezil (ARICEPT) 10 MG tablet Take 10 mg by mouth daily.    Marland Kitchen EPIPEN 2-PAK 0.3 MG/0.3ML SOAJ injection Inject 0.3 mLs as directed as directed.    . ezetimibe (ZETIA) 10 MG tablet Take 10 mg by mouth daily.    . fluticasone (FLONASE) 50 MCG/ACT nasal spray Place 2 sprays into both nostrils as needed.     . gabapentin (NEURONTIN) 100 MG capsule Take 200 mg by mouth at bedtime.    Marland Kitchen glipiZIDE (GLUCOTROL XL) 2.5 MG 24 hr tablet Take 2.5 mg by mouth daily with breakfast.    . metoprolol succinate (TOPROL-XL) 25 MG 24 hr tablet Take 1 tablet (25 mg total) by mouth daily. 90 tablet 3  . Misc Natural Products (OSTEO BI-FLEX ADV JOINT SHIELD PO) Take 1 tablet by mouth daily.    . Multiple Vitamin (MULITIVITAMIN WITH MINERALS) TABS Take 1 tablet by mouth daily.    . Omega 3 1200 MG CAPS Take 1,200 mg by mouth 2 (two) times daily.    . Saw Palmetto 450 MG CAPS Take 2 capsules by mouth daily.    . simvastatin (ZOCOR) 40 MG tablet Take 40 mg by mouth at bedtime.    . vitamin B-12 (CYANOCOBALAMIN) 1000 MCG tablet Take 1,000 mcg by mouth daily.     No current facility-administered medications for this visit.    Allergies:   Azithromycin, Cephalosporins, Codeine, Doxycycline, Oraxyl [doxycycline hyclate],  Doxycycline hyclate, Phenylephrine-guaifenesin, Pseudoephedrine, Amoxicillin, Levofloxacin, Levofloxacin, Penicillins, Sulfa antibiotics, and Sulfasalazine    Social History:  The patient  reports that he has never smoked. He quit smokeless tobacco use about 23 years ago.  His smokeless tobacco use included chew. He reports current alcohol use of about 6.0 standard drinks of alcohol per week. He reports that he does not use drugs.   Family History:  The patient's Family history is unknown by patient.    ROS: All other systems are reviewed and negative. Unless otherwise mentioned in H&P    PHYSICAL EXAM: VS:  BP 118/60   Pulse (!) 40   Ht 5'  8" (1.727 m)   Wt 167 lb 3.2 oz (75.8 kg)   SpO2 95%   BMI 25.42 kg/m  , BMI Body mass index is 25.42 kg/m. GEN: Well nourished, well developed, in no acute distress HEENT: normal Neck: no JVD, carotid bruits, or masses Cardiac: RRR 2/6 holosystolic murmur with preserved S2 heard best at the right sternal border and apex; , rubs, or gallops,no edema  Respiratory:  Clear to auscultation bilaterally, normal work of breathing GI: soft, nontender, nondistended, + BS MS: no deformity or atrophy Skin: warm and dry, no rash Neuro:  Strength and sensation are intact Psych: euthymic mood, full affect   EKG: Not completed this office visit.  Please see prior EKG  Recent Labs: No results found for requested labs within last 8760 hours.    Lipid Panel    Component Value Date/Time   CHOL 127 03/22/2011 0545   TRIG 133 03/22/2011 0545   HDL 48 03/22/2011 0545   CHOLHDL 2.6 03/22/2011 0545   VLDL 27 03/22/2011 0545   LDLCALC 52 03/22/2011 0545      Wt Readings from Last 3 Encounters:  06/18/19 167 lb 3.2 oz (75.8 kg)  04/15/19 172 lb 12.8 oz (78.4 kg)  01/01/18 169 lb 12.8 oz (77 kg)    Echocardiogram 06/12/2019 1. Since prior ECHO, aortic stenosis has increased to severe.  2. Left ventricular ejection fraction, by estimation, is 60 to  65%. The  left ventricle has normal function. The left ventricle has no regional  wall motion abnormalities. There is mild asymmetric left ventricular  hypertrophy of the basal-septal segment.  Left ventricular diastolic parameters are consistent with Grade I  diastolic dysfunction (impaired relaxation). Elevated left atrial  pressure.  3. Right ventricular systolic function is normal. The right ventricular  size is normal. There is normal pulmonary artery systolic pressure. The  estimated right ventricular systolic pressure is [id number] mmHg.  4. Left atrial size was moderately dilated.  5. The mitral valve is normal in structure. Moderate mitral valve  regurgitation. No evidence of mitral stenosis.  6. Tricuspid valve regurgitation is moderate.  7. The aortic valve is tricuspid. Aortic valve regurgitation is not  visualized. Severe aortic valve stenosis. Aortic valve area, by VTI  measures 0.49 cm. Aortic valve mean gradient measures 52.5 mmHg. Aortic  valve Vmax measures 4.56 m/s.  8. The inferior vena cava is normal in size with greater than 50%  respiratory variability, suggesting right atrial pressure of 3 mmHg.   Other studies Reviewed: (copied from Dr. Allison Quarry note for accuracy.).   He has known CAD s/p PCI to RCA in 2008. Echo at that time showed EF >55% with moderate calcification of the aortic valve leaflets.   Echo in 2012: Mild-Mod AS.   CATH 2013 for positive EKG (inferior TWI) with negative nuclear stress test. No ischemic symptoms at that time.   Patent RCA stent w/ progression of pRCA Dz to ~50-60%. Progression of oD3 lesion to 60-70% - not optimal for PCI (b/c ostial). EF 55-60% - no RWMA. Med Rx. Aortic valve gradient: Peak 22 mmHg, mean 13 mmHg  Echo &Myoview in 08/2014 - reviewed in PMH - persistent Mod AS. Normal Nuc   2 D Echo 05/29/17: Normal LV size and function. EF 60-65%. GR 1 DD. Moderate aortic stenosis with estimated valve area between 0.88-0.97  cm. (Mean gradient 25 mmHg, peak gradient 43 mmHg) --> as of 2021-Echo shows mean gradient of 42 million mercury showing significant progression.   ASSESSMENT  AND PLAN:  1.  Severe aortic valve stenosis: Symptomatic.  More fatigue and dyspnea.  Unable to go to the gym or complete daily activities without significant lack of energy. Aortic valve area, by VTI measuring 0.49 cm.  Aortic valve mean gradient measures 52.5 mmHg.  Aortic valve V-max measured 4.56 m/s per echocardiogram on 06/12/2019.  I have discussed with the patient and his son the need to undergo cardiac catheterization to evaluate for coronary artery disease progression, and prepare him for possible TAVR.  I have discussed the risks and benefits and the procedure in detail.  The patient understands that risks include but are not limited to stroke (1 in 1000), death (1 in 40), kidney failure [usually temporary] (1 in 500), bleeding (1 in 200), allergic reaction [possibly serious] (1 in 200), and agrees to proceed.   He is scheduled for May 26 at 10 AM with Dr. [name].  All questions have been answered and explanations given.  They are willing to proceed.  2.  Coronary artery disease: History of stent to the right coronary artery with progression of proximal RCA disease to 56% per cardiac catheterization in 2013.  Progression of diagonal 3 lesion to 67% but not optimal for PCI because it was ostial.  Cardiac catheterization as discussed above.  3.  Type 2 diabetes: Followed by PCP.  Will hold glipizide in the a.m. of the catheterization.  4.  Hyperlipidemia: Goal of LDL less than 70 with known history of CAD.   Current medicines are reviewed at length with the patient today.  I have spent 45 mnutes dedicated to the care of this patient on the date of this encounter to include pre-visit review of records, assessment, management and diagnostic testing,with shared decision making.  Labs/ tests ordered today include: Left heart  cath with course and possible.  [place] labs.  Phill Myron. West Pugh, ANP, AACC   06/18/2019 4:37 PM    [facility] [address] Office [phone] Fax [phone]  Notice: This dictation was prepared with Dragon dictation along with smaller phrase technology. Any transcriptional errors that result from this process are unintentional and may not be corrected upon review.

## 2019-06-18 ENCOUNTER — Encounter: Payer: Self-pay | Admitting: Adult Health

## 2019-06-18 ENCOUNTER — Other Ambulatory Visit: Payer: Self-pay

## 2019-06-18 ENCOUNTER — Ambulatory Visit (INDEPENDENT_AMBULATORY_CARE_PROVIDER_SITE_OTHER): Payer: Medicare Other | Admitting: Adult Health

## 2019-06-18 ENCOUNTER — Other Ambulatory Visit: Payer: Self-pay | Admitting: Adult Health

## 2019-06-18 VITALS — BP 118/60 | HR 40 | Ht 68.0 in | Wt 167.2 lb

## 2019-06-18 DIAGNOSIS — E1159 Type 2 diabetes mellitus with other circulatory complications: Secondary | ICD-10-CM

## 2019-06-18 DIAGNOSIS — I350 Nonrheumatic aortic (valve) stenosis: Secondary | ICD-10-CM | POA: Diagnosis not present

## 2019-06-18 DIAGNOSIS — I2510 Atherosclerotic heart disease of native coronary artery without angina pectoris: Secondary | ICD-10-CM

## 2019-06-18 DIAGNOSIS — I208 Other forms of angina pectoris: Secondary | ICD-10-CM | POA: Diagnosis not present

## 2019-06-18 DIAGNOSIS — E785 Hyperlipidemia, unspecified: Secondary | ICD-10-CM

## 2019-06-18 DIAGNOSIS — Z9861 Coronary angioplasty status: Secondary | ICD-10-CM | POA: Diagnosis not present

## 2019-06-18 DIAGNOSIS — I10 Essential (primary) hypertension: Secondary | ICD-10-CM | POA: Diagnosis not present

## 2019-06-18 NOTE — Patient Instructions (Addendum)
Medication Instructions:  Continue current medications  *If you need a refill on your cardiac medications before your next appointment, please call your pharmacy*   Lab Work: Pre Op Labs CBC and BMP  If you have labs (blood work) drawn today and your tests are completely normal, you will receive your results only by: Marland Kitchen MyChart Message (if you have MyChart) OR . A paper copy in the mail If you have any lab test that is abnormal or we need to change your treatment, we will call you to review the results.   Testing/Procedures: Your physician has requested that you have a cardiac catheterization. Cardiac catheterization is used to diagnose and/or treat various heart conditions. Doctors may recommend this procedure for a number of different reasons. The most common reason is to evaluate chest pain. Chest pain can be a symptom of coronary artery disease (CAD), and cardiac catheterization can show whether plaque is narrowing or blocking your heart's arteries. This procedure is also used to evaluate the valves, as well as measure the blood flow and oxygen levels in different parts of your heart. For further information please visit [email or web address]. Please follow instruction sheet, as given.   Follow-Up: At [facility], you and your health needs are our priority.  As part of our continuing mission to provide you with exceptional heart care, we have created designated Provider Care Teams.  These Care Teams include your primary Cardiologist (physician) and Advanced Practice Providers (APPs -  Physician Assistants and Nurse Practitioners) who all work together to provide you with the care you need, when you need it.  We recommend signing up for the patient portal called "MyChart".  Sign up information is provided on this After Visit Summary.  MyChart is used to connect with patients for Virtual Visits (Telemedicine).  Patients are able to view lab/test results, encounter notes, upcoming  appointments, etc.  Non-urgent messages can be sent to your provider as well.   To learn more about what you can do with MyChart, go to [email or web address].    Your next appointment:   2 week(s)  The format for your next appointment:   In Person  Provider:   You may see [name], MD or one of the following Advanced Practice Providers on your designated Care Team:    Rosaria Ferries, PA-C  [name], DNP, ANP  Cadence Kathlen Mody, NP    Other Instructions    [address] [place] [address] Dept: [place]: [place]  06/18/2019  You are scheduled for a Cardiac Catheterization on Wednesday, May 26 with Dr. [name].  1. Please arrive at the [facility] (Main Entrance A) at [facility]: [address] [place], [place] [address] at 8:00 AM (This time is two hours before your procedure to ensure your preparation). Free valet parking service is available.   Special note: Every effort is made to have your procedure done on time. Please understand that emergencies sometimes delay scheduled procedures.  2. Diet: Do not eat solid foods after midnight.  The patient may have clear liquids until 5am upon the day of the procedure.  3. Labs: You will need to have blood drawn on Wednesday, May 19 at [place]  Open: 8am - 5pm (Lunch 12:30 - 1:30)   Phone: [phone]. You do not need to be fasting.  COVID TEST- Saturday May 22nd @ 12:20 pm.  This is a Drive Up Visit at the [facility] [address] [place]. Someone will direct you to the appropriate testing line. Stay in your car and someone will be with you shortly.  4. Medication instructions in preparation for your procedure:   Contrast Allergy: No   On the morning of your procedure, take your Aspirin and any morning  medicines NOT listed above.  You may use sips of water.  5. Plan for one night stay--bring personal belongings. 6. Bring a current list of your medications and current insurance cards. 7. You MUST have a responsible person to drive you home. 8. Someone MUST be with you the first 24 hours after you arrive home or your discharge will be delayed. 9. Please wear clothes that are easy to get on and off and wear slip-on shoes.  Thank you for allowing [place] to care for you!   -- [facility]

## 2019-06-19 LAB — BASIC METABOLIC PANEL
BUN/Creatinine Ratio: 20 (ref 10–24)
BUN: 22 mg/dL (ref 8–27)
CO2: 25 mmol/L (ref 20–29)
Calcium: 9.2 mg/dL (ref 8.6–10.2)
Chloride: 104 mmol/L (ref 96–106)
Creatinine, Ser: 1.12 mg/dL (ref 0.76–1.27)
GFR calc Af Amer: 69 mL/min/{1.73_m2} (ref >59)
GFR calc non Af Amer: 60 mL/min/{1.73_m2} (ref >59)
Glucose: 134 mg/dL — ABNORMAL HIGH (ref 65–99)
Potassium: 5.2 mmol/L (ref 3.5–5.2)
Sodium: 145 mmol/L — ABNORMAL HIGH (ref 134–144)

## 2019-06-19 LAB — CBC
Hematocrit: 41.2 % (ref 37.5–51.0)
Hemoglobin: 13.6 g/dL (ref 13.0–17.7)
MCH: 31.6 pg (ref 26.6–33.0)
MCHC: 33 g/dL (ref 31.5–35.7)
MCV: 96 fL (ref 79–97)
Platelets: 170 10*3/uL (ref 150–450)
RBC: 4.31 x10E6/uL (ref 4.14–5.80)
RDW: 12 % (ref 11.6–15.4)
WBC: 8 10*3/uL (ref 3.4–10.8)

## 2019-06-21 ENCOUNTER — Other Ambulatory Visit (HOSPITAL_COMMUNITY)
Admission: RE | Admit: 2019-06-21 | Discharge: 2019-06-21 | Disposition: A | Discharge status: Home / Self Care | Payer: Medicare Other | Source: Ambulatory Visit | Attending: Cardiology | Admitting: Cardiology

## 2019-06-21 DIAGNOSIS — Z20822 Contact with and (suspected) exposure to covid-19: Secondary | ICD-10-CM | POA: Diagnosis not present

## 2019-06-21 DIAGNOSIS — Z01812 Encounter for preprocedural laboratory examination: Secondary | ICD-10-CM | POA: Diagnosis present

## 2019-06-21 LAB — SARS CORONAVIRUS 2 (TAT 6-24 HRS): SARS Coronavirus 2: NEGATIVE

## 2019-06-24 ENCOUNTER — Telehealth: Payer: Self-pay | Admitting: *Deleted

## 2019-06-24 NOTE — Telephone Encounter (Signed)
Pt contacted pre-catheterization scheduled at [facility] for: Wednesday Jun 25, 2019 10 AM Verified arrival time and place: [place] [facility]) at: 8 AM   No solid food after midnight prior to cath, clear liquids until 5 AM day of procedure.  Hold: Glipizide-AM of procedure  Except hold medications AM meds can be  taken pre-cath with sip of water including: ASA 81 mg   Confirmed patient has responsible adult to drive home post procedure and observe 24 hours after arriving home: yes  You are allowed ONE visitor in the waiting room during your procedure. Both you and your visitor must wear masks.      COVID-19 Pre-Screening Questions:  . In the past 7 to 10 days have you had a cough,  shortness of breath, headache, congestion, fever (100 or greater) body aches, chills, sore throat, or sudden loss of taste or sense of smell? no . Have you been around anyone with known Covid 19 in the past 7 to 10 days? no . Have you been around anyone who is awaiting Covid 19 test results in the past 7 to 10 days? no . Have you been around anyone who has mentioned symptoms of Covid 19 within the past 7 to 10 days? No  Reviewed procedure/mask/visitor instructions, COVID-19 screening questions with patient.

## 2019-06-25 ENCOUNTER — Ambulatory Visit (HOSPITAL_COMMUNITY)
Admission: RE | Admit: 2019-06-25 | Discharge: 2019-06-25 | Disposition: A | Discharge status: Home / Self Care | Payer: Medicare Other | Attending: Cardiology | Admitting: Cardiology

## 2019-06-25 ENCOUNTER — Other Ambulatory Visit: Payer: Self-pay

## 2019-06-25 ENCOUNTER — Encounter (HOSPITAL_COMMUNITY)
Admission: RE | Disposition: A | Discharge status: Home / Self Care | Payer: Self-pay | Source: Home / Self Care | Attending: Cardiology

## 2019-06-25 DIAGNOSIS — Z87891 Personal history of nicotine dependence: Secondary | ICD-10-CM | POA: Diagnosis not present

## 2019-06-25 DIAGNOSIS — Z881 Allergy status to other antibiotic agents status: Secondary | ICD-10-CM | POA: Diagnosis not present

## 2019-06-25 DIAGNOSIS — I2510 Atherosclerotic heart disease of native coronary artery without angina pectoris: Secondary | ICD-10-CM

## 2019-06-25 DIAGNOSIS — E7800 Pure hypercholesterolemia, unspecified: Secondary | ICD-10-CM | POA: Diagnosis not present

## 2019-06-25 DIAGNOSIS — I10 Essential (primary) hypertension: Secondary | ICD-10-CM | POA: Diagnosis not present

## 2019-06-25 DIAGNOSIS — Z882 Allergy status to sulfonamides status: Secondary | ICD-10-CM | POA: Diagnosis not present

## 2019-06-25 DIAGNOSIS — N400 Enlarged prostate without lower urinary tract symptoms: Secondary | ICD-10-CM | POA: Diagnosis not present

## 2019-06-25 DIAGNOSIS — Z7984 Long term (current) use of oral hypoglycemic drugs: Secondary | ICD-10-CM | POA: Insufficient documentation

## 2019-06-25 DIAGNOSIS — Z79899 Other long term (current) drug therapy: Secondary | ICD-10-CM | POA: Insufficient documentation

## 2019-06-25 DIAGNOSIS — I350 Nonrheumatic aortic (valve) stenosis: Secondary | ICD-10-CM

## 2019-06-25 DIAGNOSIS — Z9861 Coronary angioplasty status: Secondary | ICD-10-CM

## 2019-06-25 DIAGNOSIS — Z885 Allergy status to narcotic agent status: Secondary | ICD-10-CM | POA: Insufficient documentation

## 2019-06-25 DIAGNOSIS — Z9049 Acquired absence of other specified parts of digestive tract: Secondary | ICD-10-CM | POA: Insufficient documentation

## 2019-06-25 DIAGNOSIS — I6523 Occlusion and stenosis of bilateral carotid arteries: Secondary | ICD-10-CM | POA: Diagnosis not present

## 2019-06-25 DIAGNOSIS — Z955 Presence of coronary angioplasty implant and graft: Secondary | ICD-10-CM | POA: Insufficient documentation

## 2019-06-25 DIAGNOSIS — E785 Hyperlipidemia, unspecified: Secondary | ICD-10-CM | POA: Diagnosis not present

## 2019-06-25 DIAGNOSIS — K219 Gastro-esophageal reflux disease without esophagitis: Secondary | ICD-10-CM | POA: Insufficient documentation

## 2019-06-25 DIAGNOSIS — M1990 Unspecified osteoarthritis, unspecified site: Secondary | ICD-10-CM | POA: Insufficient documentation

## 2019-06-25 DIAGNOSIS — Z7982 Long term (current) use of aspirin: Secondary | ICD-10-CM | POA: Diagnosis not present

## 2019-06-25 DIAGNOSIS — Z880 Allergy status to penicillin: Secondary | ICD-10-CM | POA: Insufficient documentation

## 2019-06-25 DIAGNOSIS — I2089 Other forms of angina pectoris: Secondary | ICD-10-CM | POA: Diagnosis present

## 2019-06-25 DIAGNOSIS — E119 Type 2 diabetes mellitus without complications: Secondary | ICD-10-CM | POA: Insufficient documentation

## 2019-06-25 DIAGNOSIS — I208 Other forms of angina pectoris: Secondary | ICD-10-CM | POA: Diagnosis present

## 2019-06-25 HISTORY — PX: RIGHT/LEFT HEART CATH AND CORONARY ANGIOGRAPHY: CATH118266

## 2019-06-25 LAB — POCT I-STAT 7, (LYTES, BLD GAS, ICA,H+H)
Acid-Base Excess: 4 mmol/L — ABNORMAL HIGH (ref 0.0–2.0)
Bicarbonate: 28.9 mmol/L — ABNORMAL HIGH (ref 20.0–28.0)
Calcium, Ion: 1.25 mmol/L (ref 1.15–1.40)
HCT: 38 % — ABNORMAL LOW (ref 39.0–52.0)
Hemoglobin: 12.9 g/dL — ABNORMAL LOW (ref 13.0–17.0)
O2 Saturation: 99 %
Potassium: 4 mmol/L (ref 3.5–5.1)
Sodium: 142 mmol/L (ref 135–145)
TCO2: 30 mmol/L (ref 22–32)
pCO2 arterial: 45.8 mmHg (ref 32.0–48.0)
pH, Arterial: 7.408 (ref 7.350–7.450)
pO2, Arterial: 122 mmHg — ABNORMAL HIGH (ref 83.0–108.0)

## 2019-06-25 LAB — POCT I-STAT EG7
Acid-Base Excess: 4 mmol/L — ABNORMAL HIGH (ref 0.0–2.0)
Bicarbonate: 30.6 mmol/L — ABNORMAL HIGH (ref 20.0–28.0)
Calcium, Ion: 1.28 mmol/L (ref 1.15–1.40)
HCT: 39 % (ref 39.0–52.0)
Hemoglobin: 13.3 g/dL (ref 13.0–17.0)
O2 Saturation: 72 %
Potassium: 4.1 mmol/L (ref 3.5–5.1)
Sodium: 142 mmol/L (ref 135–145)
TCO2: 32 mmol/L (ref 22–32)
pCO2, Ven: 53 mmHg (ref 44.0–60.0)
pH, Ven: 7.369 (ref 7.250–7.430)
pO2, Ven: 40 mmHg (ref 32.0–45.0)

## 2019-06-25 LAB — GLUCOSE, CAPILLARY: Glucose-Capillary: 110 mg/dL — ABNORMAL HIGH (ref 70–99)

## 2019-06-25 SURGERY — RIGHT/LEFT HEART CATH AND CORONARY ANGIOGRAPHY
Anesthesia: LOCAL

## 2019-06-25 MED ORDER — IOHEXOL 350 MG/ML SOLN
INTRAVENOUS | Status: DC | PRN
  Administered 2019-06-25: 40 mL

## 2019-06-25 MED ORDER — HEPARIN SODIUM (PORCINE) 1000 UNIT/ML IJ SOLN
INTRAMUSCULAR | Status: AC
  Filled 2019-06-25: qty 1

## 2019-06-25 MED ORDER — LABETALOL HCL 5 MG/ML IV SOLN: 10.0000 mg | INTRAVENOUS | Status: DC | PRN

## 2019-06-25 MED ORDER — HEPARIN SODIUM (PORCINE) 1000 UNIT/ML IJ SOLN
INTRAMUSCULAR | Status: DC | PRN
  Administered 2019-06-25: 4000 [IU] via INTRAVENOUS

## 2019-06-25 MED ORDER — SODIUM CHLORIDE 0.9% FLUSH: 3.0000 mL | INTRAVENOUS | Status: DC | PRN

## 2019-06-25 MED ORDER — VERAPAMIL HCL 2.5 MG/ML IV SOLN
INTRAVENOUS | Status: AC
  Filled 2019-06-25: qty 2

## 2019-06-25 MED ORDER — LIDOCAINE HCL (PF) 1 % IJ SOLN
INTRAMUSCULAR | Status: AC
  Filled 2019-06-25: qty 30

## 2019-06-25 MED ORDER — MIDAZOLAM HCL 2 MG/2ML IJ SOLN
INTRAMUSCULAR | Status: AC
  Filled 2019-06-25: qty 2

## 2019-06-25 MED ORDER — SODIUM CHLORIDE 0.9 % IV SOLN: INTRAVENOUS | Status: DC

## 2019-06-25 MED ORDER — SODIUM CHLORIDE 0.9 % IV SOLN: 250.0000 mL | INTRAVENOUS | Status: DC | PRN

## 2019-06-25 MED ORDER — ACETAMINOPHEN 325 MG PO TABS: 650.0000 mg | ORAL_TABLET | ORAL | Status: DC | PRN

## 2019-06-25 MED ORDER — VERAPAMIL HCL 2.5 MG/ML IV SOLN
INTRAVENOUS | Status: DC | PRN
  Administered 2019-06-25: 10 mL via INTRA_ARTERIAL

## 2019-06-25 MED ORDER — HEPARIN (PORCINE) IN NACL 1000-0.9 UT/500ML-% IV SOLN
INTRAVENOUS | Status: AC
  Filled 2019-06-25: qty 1000

## 2019-06-25 MED ORDER — ONDANSETRON HCL 4 MG/2ML IJ SOLN: 4.0000 mg | Freq: Four times a day (QID) | INTRAMUSCULAR | Status: DC | PRN

## 2019-06-25 MED ORDER — LIDOCAINE HCL (PF) 1 % IJ SOLN
INTRAMUSCULAR | Status: DC | PRN
  Administered 2019-06-25 (×2): 2 mL

## 2019-06-25 MED ORDER — HYDRALAZINE HCL 20 MG/ML IJ SOLN: 10.0000 mg | INTRAMUSCULAR | Status: DC | PRN

## 2019-06-25 MED ORDER — SODIUM CHLORIDE 0.9 % WEIGHT BASED INFUSION: 1.0000 mL/kg/h | INTRAVENOUS | Status: DC

## 2019-06-25 MED ORDER — ASPIRIN 81 MG PO CHEW
81.0000 mg | CHEWABLE_TABLET | ORAL | Status: AC
  Administered 2019-06-25: 81 mg via ORAL
  Filled 2019-06-25: qty 1

## 2019-06-25 MED ORDER — SODIUM CHLORIDE 0.9% FLUSH: 3.0000 mL | Freq: Two times a day (BID) | INTRAVENOUS | Status: DC

## 2019-06-25 MED ORDER — HEPARIN (PORCINE) IN NACL 1000-0.9 UT/500ML-% IV SOLN
INTRAVENOUS | Status: DC | PRN
  Administered 2019-06-25 (×2): 500 mL

## 2019-06-25 MED ORDER — SODIUM CHLORIDE 0.9 % WEIGHT BASED INFUSION
3.0000 mL/kg/h | INTRAVENOUS | Status: AC
  Administered 2019-06-25: 3 mL/kg/h via INTRAVENOUS

## 2019-06-25 MED ORDER — MIDAZOLAM HCL 2 MG/2ML IJ SOLN
INTRAMUSCULAR | Status: DC | PRN
  Administered 2019-06-25: 0.5 mg via INTRAVENOUS

## 2019-06-25 MED ORDER — ASPIRIN 81 MG PO CHEW: 81.0000 mg | CHEWABLE_TABLET | ORAL | Status: DC

## 2019-06-25 SURGICAL SUPPLY — 16 items
BAG SNAP BAND KOVER 36X36 (MISCELLANEOUS) ×2 IMPLANT
CATH BALLN WEDGE 5F 110CM (CATHETERS) ×2 IMPLANT
CATH INFINITI 5FR AL1 (CATHETERS) ×2 IMPLANT
CATH OPTITORQUE TIG 4.0 5F (CATHETERS) ×2 IMPLANT
DEVICE RAD TR BAND REGULAR (VASCULAR PRODUCTS) ×2 IMPLANT
GLIDESHEATH SLEND SS 6F .021 (SHEATH) ×2 IMPLANT
GUIDEWIRE INQWIRE 1.5J.035X260 (WIRE) ×1 IMPLANT
INQWIRE 1.5J .035X260CM (WIRE) ×2
KIT HEART LEFT (KITS) ×2 IMPLANT
PACK CARDIAC CATHETERIZATION (CUSTOM PROCEDURE TRAY) ×2 IMPLANT
SHEATH GLIDE SLENDER 4/5FR (SHEATH) ×2 IMPLANT
SHEATH PROBE COVER 6X72 (BAG) ×2 IMPLANT
TRANSDUCER W/STOPCOCK (MISCELLANEOUS) ×2 IMPLANT
TUBING CIL FLEX 10 FLL-RA (TUBING) ×2 IMPLANT
WIRE EMERALD ST .035X150CM (WIRE) ×2 IMPLANT
WIRE HI TORQ VERSACORE-J 145CM (WIRE) ×2 IMPLANT

## 2019-06-25 NOTE — Discharge Instructions (Signed)
Radial Site Care  This sheet gives you information about how to care for yourself after your procedure. Your health care provider may also give you more specific instructions. If you have problems or questions, contact your health care provider. What can I expect after the procedure? After the procedure, it is common to have:  Bruising and tenderness at the catheter insertion area. Follow these instructions at home: Medicines  Take over-the-counter and prescription medicines only as told by your health care provider. Insertion site care  Follow instructions from your health care provider about how to take care of your insertion site. Make sure you: ? Wash your hands with soap and water before you change your bandage (dressing). If soap and water are not available, use hand sanitizer. ? Change your dressing as told by your health care provider. ? Leave stitches (sutures), skin glue, or adhesive strips in place. These skin closures may need to stay in place for 2 weeks or longer. If adhesive strip edges start to loosen and curl up, you may trim the loose edges. Do not remove adhesive strips completely unless your health care provider tells you to do that.  Check your insertion site every day for signs of infection. Check for: ? Redness, swelling, or pain. ? Fluid or blood. ? Pus or a bad smell. ? Warmth.  Do not take baths, swim, or use a hot tub until your health care provider approves.  You may shower 24-48 hours after the procedure, or as directed by your health care provider. ? Remove the dressing and gently wash the site with plain soap and water. ? Pat the area dry with a clean towel. ? Do not rub the site. That could cause bleeding.  Do not apply powder or lotion to the site. Activity   For 24 hours after the procedure, or as directed by your health care provider: ? Do not flex or bend the affected arm. ? Do not push or pull heavy objects with the affected arm. ? Do not  drive yourself home from the hospital or clinic. You may drive 24 hours after the procedure unless your health care provider tells you not to. ? Do not operate machinery or power tools.  Do not lift anything that is heavier than 10 lb (4.5 kg), or the limit that you are told, until your health care provider says that it is safe.  Ask your health care provider when it is okay to: ? Return to work or school. ? Resume usual physical activities or sports. ? Resume sexual activity. General instructions  If the catheter site starts to bleed, raise your arm and put firm pressure on the site. If the bleeding does not stop, get help right away. This is a medical emergency.  If you went home on the same day as your procedure, a responsible adult should be with you for the first 24 hours after you arrive home.  Keep all follow-up visits as told by your health care provider. This is important. Contact a health care provider if:  You have a fever.  You have redness, swelling, or yellow drainage around your insertion site. Get help right away if:  You have unusual pain at the radial site.  The catheter insertion area swells very fast.  The insertion area is bleeding, and the bleeding does not stop when you hold steady pressure on the area.  Your arm or hand becomes pale, cool, tingly, or numb. These symptoms may represent a serious problem   that is an emergency. Do not wait to see if the symptoms will go away. Get medical help right away. Call your local emergency services (911 in the [place] Do not drive yourself to the hospital. Summary  After the procedure, it is common to have bruising and tenderness at the site.  Follow instructions from your health care provider about how to take care of your radial site wound. Check the wound every day for signs of infection.  Do not lift anything that is heavier than 10 lb (4.5 kg), or the limit that you are told, until your health care provider says  that it is safe. This information is not intended to replace advice given to you by your health care provider. Make sure you discuss any questions you have with your health care provider. Document Revised: 02/21/2017 Document Reviewed: 02/21/2017 Elsevier Patient Education  2020 Elsevier Inc.  

## 2019-06-25 NOTE — Interval H&P Note (Signed)
History and Physical Interval Note:  06/25/2019 10:10 AM  Victor [name]  has presented today for surgery, with the diagnosis of severe calcific aortic stenoisis.  The various methods of treatment have been discussed with the patient and family. After consideration of risks, benefits and other options for treatment, the patient has consented to  Procedure(s): RIGHT/LEFT HEART CATH AND CORONARY ANGIOGRAPHY (N/A) as a surgical intervention.  The patient's history has been reviewed, patient examined, no change in status, stable for surgery.  I have reviewed the patient's chart and labs.  Questions were answered to the patient's satisfaction.     [name]

## 2019-06-26 LAB — POCT I-STAT EG7
Acid-Base Excess: 5 mmol/L — ABNORMAL HIGH (ref 0.0–2.0)
Bicarbonate: 30.8 mmol/L — ABNORMAL HIGH (ref 20.0–28.0)
Calcium, Ion: 1.25 mmol/L (ref 1.15–1.40)
HCT: 39 % (ref 39.0–52.0)
Hemoglobin: 13.3 g/dL (ref 13.0–17.0)
O2 Saturation: 75 %
Potassium: 4 mmol/L (ref 3.5–5.1)
Sodium: 142 mmol/L (ref 135–145)
TCO2: 32 mmol/L (ref 22–32)
pCO2, Ven: 51.5 mmHg (ref 44.0–60.0)
pH, Ven: 7.385 (ref 7.250–7.430)
pO2, Ven: 42 mmHg (ref 32.0–45.0)

## 2019-07-01 HISTORY — PX: TRANSTHORACIC ECHOCARDIOGRAM: SHX275

## 2019-07-02 ENCOUNTER — Telehealth: Payer: Self-pay | Admitting: Cardiovascular Disease

## 2019-07-02 NOTE — Telephone Encounter (Signed)
   Pt would like to bring his daughter Renne Crigler to his appt on 07/07/19. He said he is hard of hearing and he needs her to assist him  Please advise

## 2019-07-02 NOTE — Telephone Encounter (Signed)
Pt made aware it is okay for his daughter to come to appointment.

## 2019-07-07 ENCOUNTER — Other Ambulatory Visit: Payer: Self-pay

## 2019-07-07 ENCOUNTER — Encounter: Payer: Self-pay | Admitting: Cardiovascular Disease

## 2019-07-07 ENCOUNTER — Ambulatory Visit (INDEPENDENT_AMBULATORY_CARE_PROVIDER_SITE_OTHER): Payer: Medicare Other | Admitting: Cardiovascular Disease

## 2019-07-07 VITALS — BP 110/72 | HR 65 | Ht 68.0 in | Wt 163.8 lb

## 2019-07-07 DIAGNOSIS — I208 Other forms of angina pectoris: Secondary | ICD-10-CM

## 2019-07-07 DIAGNOSIS — I350 Nonrheumatic aortic (valve) stenosis: Secondary | ICD-10-CM | POA: Diagnosis not present

## 2019-07-07 DIAGNOSIS — Z01812 Encounter for preprocedural laboratory examination: Secondary | ICD-10-CM | POA: Diagnosis not present

## 2019-07-07 NOTE — Patient Instructions (Addendum)
Medication Instructions:  No changes  Lab Work: Today: Art gallery manager. Please stop at the lab today before you leave our office.  Follow-Up:  Theodosia Quay, RN Structural Nurse Navigator will contact you regarding the next steps in planning for TAVR.

## 2019-07-07 NOTE — Progress Notes (Signed)
[facility] Consult Note  Chief Complaint  Patient presents with  . Follow-up    Severe aortic stenosis     History of Present Illness: 86 yo male with history of arthritis, carotid artery disease, BPH, CAD, DM, GERD, hyperlipidemia, HTN, diverticulitis s/p partial colectomy and critical aortic stenosis who is here today as a new consult, referred by Dr. [name], for further evaluation of his aortic stenosis and discussion regarding TAVR. He is known to have CAD. PCI of the RCA in 2008. Cardiac cath 06/25/19 with patent RCA stent, otherwise stable disease with moderate RCA and moderate Diagonal stenosis. Normal right heart pressures. Echo 06/12/19 with LVEF=60-65%. Moderate mitral regurgitation. The aortic valve leaflets are severely thickened and calcified with limited leaflet excursion c/w critical AS. (mean gradient 52.5 mmHg, peak gradient 83 mmHg, AVA 0.49 cm2. Dimensionless index 0.14).   He tells me today that he has had progressive fatigue and dyspnea. No chest pain.  He has his own teeth. No dentures. No active dental issues. He lives with his wife in [place], [place]. He is a retired Art gallery manager.   Cass: Sofie Hartigan, MD Primary Cardiologist: [name] Referring Cardiologist: [name]  Past Medical History:  Diagnosis Date  . Angina   . Aortic valve stenosis, moderate 08/2014   Progression to moderate-severe stenosis: Echo in June 2018 showed mean gradient 30 mmHg, peak gradient 57 mmHg.  . Arthritis   . Bilateral carotid artery disease ([place])    CAROTID DOPPLER,05/21/2008 - Right and left ICA-0-49% diameter reduction, left CCA-0-49% diameter reductiion  . BPH (benign prostatic hypertrophy)   . CAD S/P percutaneous coronary angioplasty 07/2006   PCI to RCA - Promus DES 2.5 mm x 23 mm; 2D ECHO - EF >55%, moderate calcification of the aortic valve leaflets  . Diabetes mellitus   . GERD (gastroesophageal reflux disease)   . High cholesterol     . Hypertension    "from the diabetes"    Past Surgical History:  Procedure Laterality Date  . CARDIAC CATHETERIZATION with PCI  08/27/2006   RCA-mid - 2.5x5mm Promus stent  . CATARACT EXTRACTION W/PHACO Right 03/08/2015   Procedure: CATARACT EXTRACTION PHACO AND INTRAOCULAR LENS PLACEMENT (IOC);  Surgeon: Estill Cotta, MD;  Location: [facility] ORS;  Service: Ophthalmology;  Laterality: Right;  [place]: 01:29.4   . COLECTOMY  ~ 2000  . LEFT HEART CATHETERIZATION WITH CORONARY ANGIOGRAM N/A 03/22/2011   Procedure: LEFT HEART CATHETERIZATION WITH CORONARY ANGIOGRAM;  Surgeon: Leonie Man, MD;  Location: [facility] CATH LAB;  Service: Cardiovascular::: Patent RCA stent w/ progression of pRCA Dz to ~50-60%. Progression of oD3 lesion to 60-70% - not optimal for PCI (b/c ostial).  EF 55-60% - no RWMA. Med Rx.  Aortic valve gradient: Peak 22 mmHg, mean 13 mmHg  . NM MYOVIEW LTD  08/2014   LOW RISK. NORMAL.  EF 45-54%.   Marland Kitchen RIGHT/LEFT HEART CATH AND CORONARY ANGIOGRAPHY N/A 06/25/2019   Procedure: RIGHT/LEFT HEART CATH AND CORONARY ANGIOGRAPHY;  Surgeon: Leonie Man, MD;  Location: [facility] LAB;  Service: Cardiovascular;  Laterality: N/A;  . TRANSTHORACIC ECHOCARDIOGRAM  04/2017   Normal LV size and function.  EF 60-65%.  GR 1 DD.  Moderate aortic stenosis with estimated valve area between 0.88-0.97 cm.  (Mean gradient 25 mmHg, peak gradient 43 mmHg)  . TRANSTHORACIC ECHOCARDIOGRAM  08/26/2015   Normal LV size with mild concentric hypertrophy. Normal EF 55-60%. No RWMA. GR 1 DD. Severe aortic stenosis (mean  gradient 24 mmHg, AVA 0.75 cm) --> progression from 2016. Mild-moderate MR. Mildly dilated RV. Mildly elevated PA pressures (40 mmHg.  Marland Kitchen TRANSTHORACIC ECHOCARDIOGRAM  06/2016   EF 60-65%. GR 1 DD. Moderate to severe aortic stenosis with a mean gradient 30 mmHg, peak gradient of 57 mmHg.   Marland Kitchen TRANSURETHRAL RESECTION OF PROSTATE  ~ 2010    Current Outpatient Medications  Medication Sig Dispense  Refill  . acetaminophen (TYLENOL) 500 MG tablet Take 500 mg by mouth every 6 (six) hours as needed for moderate pain or headache.    . Artificial Tear Solution (GENTEAL TEARS OP) Place 1 drop into both eyes daily.    Marland Kitchen ascorbic acid (VITAMIN C) 500 MG tablet Take 1,000-1,500 mg by mouth See admin instructions. Take 1000 mg in the morning and 1500 mg at night    . aspirin EC 81 MG tablet Take 81 mg by mouth daily.    . benzonatate (TESSALON) 200 MG capsule Take 200 mg by mouth 3 (three) times daily as needed for cough.    . bismuth subsalicylate (PEPTO BISMOL) 262 MG/15ML suspension Take 30 mLs by mouth every 6 (six) hours as needed for indigestion or diarrhea or loose stools.    . calcium carbonate (OS-CAL) 600 MG TABS tablet Take 600 mg by mouth daily.    . cetirizine (ZYRTEC) 10 MG tablet Take 10 mg by mouth daily.    . Cholecalciferol (VITAMIN D3) 2000 UNITS TABS Take 4,000 Units by mouth 2 (two) times daily.     Marland Kitchen CINNAMON PO Take 1,000 mg by mouth 2 (two) times daily.    . Coenzyme Q10 (COQ-10) 100 MG CAPS Take 100 mg by mouth daily.    . Cyanocobalamin (B-12) 2500 MCG TABS Take 2,500 mcg by mouth daily.    Marland Kitchen donepezil (ARICEPT) 10 MG tablet Take 10 mg by mouth daily.    Marland Kitchen EPIPEN 2-PAK 0.3 MG/0.3ML SOAJ injection Inject 0.3 mLs as directed as needed for anaphylaxis.     Marland Kitchen ezetimibe (ZETIA) 10 MG tablet Take 10 mg by mouth daily.    . fluticasone (FLONASE) 50 MCG/ACT nasal spray Place 2 sprays into both nostrils 2 (two) times daily as needed for allergies.     Marland Kitchen gabapentin (NEURONTIN) 100 MG capsule Take 300 mg by mouth at bedtime.     Marland Kitchen glipiZIDE (GLUCOTROL XL) 2.5 MG 24 hr tablet Take 2.5 mg by mouth daily with breakfast.    . Glucosamine HCl 1000 MG TABS Take 1,000 mg by mouth 2 (two) times daily.    Marland Kitchen ipratropium (ATROVENT) 0.03 % nasal spray Place 2 sprays into both nostrils 2 (two) times daily as needed for rhinitis.    Marland Kitchen loperamide (IMODIUM A-D) 2 MG tablet Take 2 mg by mouth daily as  needed for diarrhea or loose stools.    . methocarbamol (ROBAXIN) 500 MG tablet Take 500 mg by mouth at bedtime as needed for muscle spasms.    . metoprolol succinate (TOPROL-XL) 25 MG 24 hr tablet Take 1 tablet (25 mg total) by mouth daily. 90 tablet 3  . Multiple Vitamin (MULITIVITAMIN WITH MINERALS) TABS Take 1 tablet by mouth 2 (two) times daily.     . Omega 3 1200 MG CAPS Take 2,400 mg by mouth 2 (two) times daily.     . Probiotic CAPS Take 1 capsule by mouth daily.    . pseudoephedrine (SUDAFED) 30 MG tablet Take 60 mg by mouth every 4 (four) hours as needed for congestion.    Marland Kitchen  Pumpkin Seed-Soy Germ (AZO BLADDER CONTROL/GO-LESS PO) Take 1 tablet by mouth daily.    . Saw Palmetto 450 MG CAPS Take 900 mg by mouth daily.     . simvastatin (ZOCOR) 40 MG tablet Take 40 mg by mouth at bedtime.    . triamcinolone cream (KENALOG) 0.1 % Apply 1 application topically 3 (three) times daily as needed for itching.    . zinc gluconate 50 MG tablet Take 50 mg by mouth daily.     No current facility-administered medications for this visit.    Allergies  Allergen Reactions  . Azithromycin Rash and Anaphylaxis  . Cephalosporins Anaphylaxis and Other (See Comments)  . Codeine Anaphylaxis, Itching and Rash  . Doxycycline Anaphylaxis  . Phenylephrine-Guaifenesin     Unknown reaction   . Pseudoephedrine Other (See Comments)    unknown   . Amoxicillin Rash  . Levofloxacin Rash  . Penicillins Rash    Other reaction(s): Unknown  . Sulfa Antibiotics Rash  . Sulfasalazine Rash    Social History   Socioeconomic History  . Marital status: Married    Spouse name: Not on file  . Number of children: 2  . Years of education: Not on file  . Highest education level: Not on file  Occupational History  . Occupation: Proofreader  Tobacco Use  . Smoking status: Never Smoker  . Smokeless tobacco: Former Systems developer    Types: Chew  Substance and Sexual Activity  . Alcohol use: Yes     Alcohol/week: 6.0 standard drinks    Types: 6 Glasses of wine per week  . Drug use: No  . Sexual activity: Yes  Other Topics Concern  . Not on file  Social History Narrative   Father of 2, grandfather 74.   Exercise for almost 2 hours a day, doing least 20 minutes on the elliptical trainer. He does his to 4 days a week. He'll also does weights and stretching exercises.   Social Determinants of Health   Financial Resource Strain:   . Difficulty of Paying Living Expenses:   Food Insecurity:   . Worried About Charity fundraiser in the Last Year:   . Arboriculturist in the Last Year:   Transportation Needs:   . Film/video editor (Medical):   Marland Kitchen Lack of Transportation (Non-Medical):   Physical Activity:   . Days of Exercise per Week:   . Minutes of Exercise per Session:   Stress:   . Feeling of Stress :   Social Connections:   . Frequency of Communication with Friends and Family:   . Frequency of Social Gatherings with Friends and Family:   . Attends Religious Services:   . Active Member of Clubs or Organizations:   . Attends Archivist Meetings:   Marland Kitchen Marital Status:   Intimate Partner Violence:   . Fear of Current or Ex-Partner:   . Emotionally Abused:   Marland Kitchen Physically Abused:   . Sexually Abused:     Family History  Problem Relation Age of Onset  . ALS Mother   . Prostate cancer Father     Review of Systems:  As stated in the HPI and otherwise negative.   BP 110/72   Pulse 65   Ht 5\' 8"  (1.727 m)   Wt 163 lb 12.8 oz (74.3 kg)   SpO2 95%   BMI 24.91 kg/m   Physical Examination: General: Well developed, well nourished, NAD  HEENT: OP clear, mucus membranes moist  SKIN:  warm, dry. No rashes. Neuro: No focal deficits  Musculoskeletal: Muscle strength 5/5 all ext  Psychiatric: Mood and affect normal  Neck: No JVD, no carotid bruits, no thyromegaly, no lymphadenopathy.  Lungs:Clear bilaterally, no wheezes, rhonci, crackles Cardiovascular: Regular rate  and rhythm. Loud, harsh, late peaking systolic murmur.  Abdomen:Soft. Bowel sounds present. Non-tender.  Extremities: No lower extremity edema. Pulses are 2 + in the bilateral DP/PT.  EKG:  EKG is not ordered today. The ekg ordered today demonstrates   Echo 06/12/19: 1. Since prior ECHO, aortic stenosis has increased to severe.  2. Left ventricular ejection fraction, by estimation, is 60 to 65%. The  left ventricle has normal function. The left ventricle has no regional  wall motion abnormalities. There is mild asymmetric left ventricular  hypertrophy of the basal-septal segment.  Left ventricular diastolic parameters are consistent with Grade I  diastolic dysfunction (impaired relaxation). Elevated left atrial  pressure.  3. Right ventricular systolic function is normal. The right ventricular  size is normal. There is normal pulmonary artery systolic pressure. The  estimated right ventricular systolic pressure is 40.8 mmHg.  4. Left atrial size was moderately dilated.  5. The mitral valve is normal in structure. Moderate mitral valve  regurgitation. No evidence of mitral stenosis.  6. Tricuspid valve regurgitation is moderate.  7. The aortic valve is tricuspid. Aortic valve regurgitation is not  visualized. Severe aortic valve stenosis. Aortic valve area, by VTI  measures 0.49 cm. Aortic valve mean gradient measures 52.5 mmHg. Aortic  valve Vmax measures 4.56 m/s.  8. The inferior vena cava is normal in size with greater than 50%  respiratory variability, suggesting right atrial pressure of 3 mmHg.   FINDINGS  Left Ventricle: Left ventricular ejection fraction, by estimation, is 60  to 65%. The left ventricle has normal function. The left ventricle has no  regional wall motion abnormalities. The left ventricular internal cavity  size was normal in size. There is  mild asymmetric left ventricular hypertrophy of the basal-septal segment.  Left ventricular diastolic  parameters are consistent with Grade I  diastolic dysfunction (impaired relaxation). Elevated left atrial  pressure.   Right Ventricle: The right ventricular size is normal. No increase in  right ventricular wall thickness. Right ventricular systolic function is  normal. There is normal pulmonary artery systolic pressure. The tricuspid  regurgitant velocity is 2.59 m/s, and  with an assumed right atrial pressure of 3 mmHg, the estimated right  ventricular systolic pressure is 14.4 mmHg.   Left Atrium: Left atrial size was moderately dilated.   Right Atrium: Right atrial size was normal in size.   Pericardium: There is no evidence of pericardial effusion.   Mitral Valve: The mitral valve is normal in structure. Normal mobility of  the mitral valve leaflets. Moderate mitral valve regurgitation. No  evidence of mitral valve stenosis.   Tricuspid Valve: The tricuspid valve is normal in structure. Tricuspid  valve regurgitation is moderate . No evidence of tricuspid stenosis.   Aortic Valve: The aortic valve is tricuspid. . There is severe thickening  and severe calcifcation of the aortic valve. Aortic valve regurgitation is  not visualized. Severe aortic stenosis is present. There is severe  thickening of the aortic valve. There  is severe calcifcation of the aortic valve. Aortic valve mean gradient  measures 52.5 mmHg. Aortic valve peak gradient measures 83.0 mmHg. Aortic  valve area, by VTI measures 0.49 cm.   Pulmonic Valve: The pulmonic valve was normal in structure. Pulmonic  valve  regurgitation is trivial. No evidence of pulmonic stenosis.   Aorta: The aortic root is normal in size and structure.   Venous: The inferior vena cava is normal in size with greater than 50%  respiratory variability, suggesting right atrial pressure of 3 mmHg.   IAS/Shunts: No atrial level shunt detected by color flow Doppler.   Additional Comments: Since prior ECHO, aortic stenosis has  increased to  severe.     LEFT VENTRICLE  PLAX 2D  LVIDd:     4.40 cm Diastology  LVIDs:     2.60 cm LV e' lateral:  5.98 cm/s  LV PW:     1.00 cm LV E/e' lateral: 15.2  LV IVS:    1.30 cm LV e' medial:  4.90 cm/s  LVOT diam:   2.10 cm LV E/e' medial: 18.5  LV SV:     56  LV SV Index:  29  LVOT Area:   3.46 cm     RIGHT VENTRICLE  RV S prime:   7.94 cm/s  TAPSE (M-mode): 2.4 cm   LEFT ATRIUM       Index    RIGHT ATRIUM      Index  LA diam:    3.80 cm 1.98 cm/m RA Area:   17.80 cm  LA Vol (A2C):  80.1 ml 41.70 ml/m RA Volume:  44.50 ml 23.17 ml/m  LA Vol (A4C):  52.0 ml 27.07 ml/m  LA Biplane Vol: 70.8 ml 36.86 ml/m  AORTIC VALVE  AV Area (Vmax):  0.51 cm  AV Area (Vmean):  0.50 cm  AV Area (VTI):   0.49 cm  AV Vmax:      455.50 cm/s  AV Vmean:     342.000 cm/s  AV VTI:      1.135 m  AV Peak Grad:   83.0 mmHg  AV Mean Grad:   52.5 mmHg  LVOT Vmax:     66.60 cm/s  LVOT Vmean:    49.100 cm/s  LVOT VTI:     0.161 m  LVOT/AV VTI ratio: 0.14    AORTA  Ao Root diam: 3.20 cm  Ao Asc diam: 3.30 cm   MITRAL VALVE        TRICUSPID VALVE  MV Area (PHT): 3.27 cm   TR Peak grad:  26.8 mmHg  MV Decel Time: 232 msec   TR Vmax:    259.00 cm/s  MV E velocity: 90.80 cm/s  MV A velocity: 127.00 cm/s SHUNTS  MV E/A ratio: 0.71     Systemic VTI: 0.16 m               Systemic Diam: 2.10 cm   Cardiac cath 04/28/90:  LV end diastolic pressure is normal.  Right heart cath pressures are normal  There is severe aortic valve stenosis. Mean gradient 44.2 mmHg  Prox RCA lesion is 60% stenosed. Stable from prior cath  Previously placed Prox RCA to Mid RCA stent (unknown type) is widely patent.  2nd Diag lesion is 70% stenosed. 3rd Diag lesion is 50% stenosed. -Both lesions are not favorable for PCI. Also stable.    SUMMARY  Confirmation of severe aortic stenosis with mean gradient 44.2 mmHg.  Peak to peak 45 mmHg. (By echo was 52 mmHg)  Stable coronary arteries with 50 to 60% proximal RCA followed by brief ectatic segment and then widely patent mid stent.  Otherwise small caliber 1st Diag ~70% ostial stenosis.   Normal right heart cath numbers.  Diagnostic  Dominance: Right Left Main  Vessel was injected. Vessel is large. Vessel is angiographically normal.  Left Anterior Descending  Vessel is angiographically normal. The vessel is tortuous.  First Diagonal Branch  Vessel is large in size. Vessel is angiographically normal.  Second Diagonal Branch  Vessel is small in size.  2nd Diag lesion 70% stenosed  2nd Diag lesion is 70% stenosed. The lesion is focal, discrete and concentric.  Third Diagonal Branch  Vessel is moderate in size.  3rd Diag lesion 50% stenosed  3rd Diag lesion is 50% stenosed. The lesion is focal, discrete and concentric.  Left Circumflex  Vessel is large.  First Obtuse Marginal Branch  Vessel is moderate in size. Vessel is angiographically normal.  Second Obtuse Marginal Branch  Vessel is large in size. Vessel is angiographically normal. The vessel is tortuous.  First Left Posterolateral Branch  Vessel is moderate in size.  Right Coronary Artery  The vessel is mildly tortuous. The vessel is mildly ectatic. Focal ectasia after focal proximal stenosis  Prox RCA lesion 60% stenosed  Prox RCA lesion is 60% stenosed. The lesion is located at the bend, focal and discrete. Followed by Po stenotic ectasia  Prox RCA to Mid RCA lesion 0% stenosed  Previously placed Prox RCA to Mid RCA stent (unknown type) is widely patent.  Right Ventricular Branch  Vessel is small in size.  Right Posterior Atrioventricular Artery  Vessel is small in size.  Intervention  No interventions have been documented. Right Heart  Right Heart Pressures PAP-mean: 22/3 mmHg - 12 mmHg PCWP 4 mmHg LV  EDP is normal. LV P-EDP: 152/2 mmHg - 10 mmHg AoP-MAP: 107/53 mmHg - 75 mmHg Ao sat 99%, PA sat 74%. CARDIAC OUTPUT,INDEX (Fick): 5.48, 2.88  Right Atrium Right atrial pressure is normal. RAP 2 mmHg  Right Ventricle RVP-EDP: 21/1 mmHg - 3 mmHg  Wall Motion  Resting    No LV gram performed. By ECHO EF 60-65% with no R WMA.        Left Heart  Left Ventricle LV end diastolic pressure is normal.  Aortic Valve There is severe aortic valve stenosis. The aortic valve is calcified. There is restricted aortic valve motion. By ECHO mean gradient 52.5 mmHg By cath mean gradient 44.2 mmHg, P-P 45 million mercury.  Coronary Diagrams  Diagnostic Dominance: Right  Intervention  Implants   No implant documentation for this case.  Syngo Images  Show images for CARDIAC CATHETERIZATION  Images on Long Term Storage  Show images for Jaison, Petraglia to Procedure Log  Procedure Log    Hemo Data   Most Recent Value  Fick Cardiac Output 5.48 L/min  Fick Cardiac Output Index 2.88 (L/min)/BSA  Aortic Mean Gradient 44.24 mmHg  Aortic Peak Gradient 45 mmHg  Aortic Valve Area 0.92  Aortic Value Area Index 0.48 cm2/BSA  RA A Wave 3 mmHg  RA V Wave 1 mmHg  RA Mean 1 mmHg  RV Systolic Pressure 21 mmHg  RV Diastolic Pressure 0 mmHg  RV EDP 3 mmHg  PA Systolic Pressure 22 mmHg  PA Diastolic Pressure 3 mmHg  PA Mean 12 mmHg  PW A Wave 5 mmHg  PW V Wave 4 mmHg  PW Mean 4 mmHg  AO Systolic Pressure 952 mmHg  AO Diastolic Pressure 59 mmHg  AO Mean 79 mmHg  LV Systolic Pressure 841 mmHg  LV Diastolic Pressure 2 mmHg  LV EDP 10 mmHg  AOp Systolic Pressure 324 mmHg  AOp  Diastolic Pressure 53 mmHg  AOp Mean Pressure 75 mmHg  LVp Systolic Pressure 573 mmHg  LVp Diastolic Pressure 2 mmHg  LVp EDP Pressure 9 mmHg  QP/QS 1  TPVR Index 4.17 HRUI  TSVR Index 27.49 HRUI  PVR SVR Ratio 0.1  TPVR/TSVR Ratio 0.15     Recent Labs: 06/18/2019: BUN 22; Creatinine, Ser 1.12; Platelets  170 06/25/2019: Hemoglobin 13.3; Hemoglobin 13.3; Potassium 4.1; Potassium 4.0; Sodium 142; Sodium 142    Wt Readings from Last 3 Encounters:  07/07/19 163 lb 12.8 oz (74.3 kg)  06/25/19 170 lb (77.1 kg)  06/18/19 167 lb 3.2 oz (75.8 kg)     Other studies Reviewed: Additional studies/ records that were reviewed today include: cath images, echo images, office notes Review of the above records demonstrates: critical AS   Assessment and Plan:   1. Severe Aortic Valve Stenosis: He has critical stage D1 aortic valve stenosis. I have personally reviewed the echo images. The aortic valve is thickened, calcified with limited leaflet mobility. I think he would benefit from AVR. Given advanced age, he is not a good candidate for conventional AVR by surgical approach. I think he may be a good candidate for TAVR.   STS Risk Score: Risk of Mortality: 2.225% Renal Failure: 1.916% Permanent Stroke: 1.350% Prolonged Ventilation: 7.006% DSW Infection: 0.087% Reoperation: 4.351% Morbidity or Mortality: 12.560% Short Length of Stay: 31.482% Long Length of Stay: 6.786%   I have reviewed the natural history of aortic stenosis with the patient and their family members  who are present today. We have discussed the limitations of medical therapy and the poor prognosis associated with symptomatic aortic stenosis. We have reviewed potential treatment options, including palliative medical therapy, conventional surgical aortic valve replacement, and transcatheter aortic valve replacement. We discussed treatment options in the context of the patient's specific comorbid medical conditions.   He would like to proceed with planning for TAVR. Risks and benefits of the valve procedure are reviewed with the patient. We will arrange a cardiac CT, CTA of the chest/abdomen and pelvis, carotid artery dopplers, PT assessment and he will then be referred to see one of the CT surgeons on our TAVR team.      Current  medicines are reviewed at length with the patient today.  The patient does not have concerns regarding medicines.  The following changes have been made:  no change  Labs/ tests ordered today include:   Orders Placed This Encounter  Procedures  . Basic metabolic panel     Disposition:   FU with the valve team.    Signed, Lauree Chandler, MD 07/07/2019 4:26 PM    [facility] [place], [place], [place]  [address] Phone: [phone]; Fax: [phone]

## 2019-07-08 ENCOUNTER — Other Ambulatory Visit: Payer: Self-pay

## 2019-07-08 DIAGNOSIS — I350 Nonrheumatic aortic (valve) stenosis: Secondary | ICD-10-CM

## 2019-07-08 LAB — BASIC METABOLIC PANEL
BUN/Creatinine Ratio: 19 (ref 10–24)
BUN: 19 mg/dL (ref 8–27)
CO2: 23 mmol/L (ref 20–29)
Calcium: 9.2 mg/dL (ref 8.6–10.2)
Chloride: 106 mmol/L (ref 96–106)
Creatinine, Ser: 1 mg/dL (ref 0.76–1.27)
GFR calc Af Amer: 79 mL/min/{1.73_m2} (ref >59)
GFR calc non Af Amer: 68 mL/min/{1.73_m2} (ref >59)
Glucose: 142 mg/dL — ABNORMAL HIGH (ref 65–99)
Potassium: 4.6 mmol/L (ref 3.5–5.2)
Sodium: 145 mmol/L — ABNORMAL HIGH (ref 134–144)

## 2019-07-09 ENCOUNTER — Ambulatory Visit: Payer: Medicare Other | Admitting: Physician Assistant

## 2019-07-11 ENCOUNTER — Ambulatory Visit: Payer: Medicare Other | Admitting: Physical Therapy

## 2019-07-11 ENCOUNTER — Ambulatory Visit (HOSPITAL_COMMUNITY): Payer: Medicare Other

## 2019-07-11 ENCOUNTER — Encounter (HOSPITAL_COMMUNITY): Payer: Medicare Other

## 2019-07-16 ENCOUNTER — Other Ambulatory Visit: Payer: Self-pay

## 2019-07-16 ENCOUNTER — Ambulatory Visit (HOSPITAL_COMMUNITY): Admission: RE | Admit: 2019-07-16 | Payer: Medicare Other | Source: Ambulatory Visit

## 2019-07-16 ENCOUNTER — Ambulatory Visit (HOSPITAL_COMMUNITY)
Admission: RE | Admit: 2019-07-16 | Discharge: 2019-07-16 | Disposition: A | Discharge status: Home / Self Care | Payer: Medicare Other | Source: Ambulatory Visit | Attending: Cardiovascular Disease | Admitting: Cardiovascular Disease

## 2019-07-16 DIAGNOSIS — I350 Nonrheumatic aortic (valve) stenosis: Secondary | ICD-10-CM | POA: Diagnosis present

## 2019-07-16 MED ORDER — IOHEXOL 350 MG/ML SOLN
80.0000 mL | Freq: Once | INTRAVENOUS | Status: AC | PRN
  Administered 2019-07-16: 80 mL via INTRAVENOUS

## 2019-07-17 ENCOUNTER — Encounter: Payer: Medicare Other | Admitting: Thoracic Surgery (Cardiothoracic Vascular Surgery)

## 2019-07-17 NOTE — Progress Notes (Signed)
[place] ([place]), [place] - [place] ([place]) [place] [address] Phone: [phone] Fax: [facility] [address], [place] [address] Phone: [phone] Fax: [phone]      Your procedure is scheduled on 07/22/19.  Report to [id number] Main Entrance "A" at 10:15 A.M., and check in at the Admitting office.  Call this number if you have problems the morning of surgery:  [phone]  Call [phone] if you have any questions prior to your surgery date Monday-Friday 8am-4pm    Remember:  Do not eat or drink after midnight the night before your surgery    Continue taking all current medications without change through the day before surgery.   On the morning of surgery, do NOT take any medications.    WHAT DO I DO ABOUT MY DIABETES MEDICATION?   Marland Kitchen Do not take oral diabetes medicines (pills) the morning of surgery.   HOW TO MANAGE YOUR DIABETES BEFORE AND AFTER SURGERY  Why is it important to control my blood sugar before and after surgery? . Improving blood sugar levels before and after surgery helps healing and can limit problems. . A way of improving blood sugar control is eating a healthy diet by: o  Eating less sugar and carbohydrates o  Increasing activity/exercise o  Talking with your doctor about reaching your blood sugar goals . High blood sugars (greater than 180 mg/dL) can raise your risk of infections and slow your recovery, so you will need to focus on controlling your diabetes during the weeks before surgery. . Make sure that the doctor who takes care of your diabetes knows about your planned surgery including the date and location.  How do I manage my blood sugar before surgery? . Check your blood sugar at least 4 times a day, starting 2 days before surgery, to make sure that the level is not too high or low. . Check your blood  sugar the morning of your surgery when you wake up and every 2 hours until you get to the Short Stay unit. o If your blood sugar is less than 70 mg/dL, you will need to treat for low blood sugar: - Do not take insulin. - Treat a low blood sugar (less than 70 mg/dL) with  cup of clear juice (cranberry or apple), 4 glucose tablets, OR glucose gel. - Recheck blood sugar in 15 minutes after treatment (to make sure it is greater than 70 mg/dL). If your blood sugar is not greater than 70 mg/dL on recheck, call [phone] for further instructions. . Report your blood sugar to the short stay nurse when you get to Short Stay.  . If you are admitted to the hospital after surgery: o Your blood sugar will be checked by the staff and you will probably be given insulin after surgery (instead of oral diabetes medicines) to make sure you have good blood sugar levels. o The goal for blood sugar control after surgery is 80-180 mg/dL.   As of today, STOP taking any Aspirin (unless otherwise instructed by your surgeon) and Aspirin containing products, Aleve, Naproxen, Ibuprofen, Motrin, Advil, Goody's, BC's, all herbal medications, fish oil, and all vitamins.                      Do not wear jewelry            Do not wear lotions, powders,  colognes, or deodorant.            Do not shave 48 hours prior to surgery.  Men may shave face and neck.            Do not bring valuables to the hospital.            [facility] is not responsible for any belongings or valuables.  Do NOT Smoke (Tobacco/Vapping) or drink Alcohol 24 hours prior to your procedure If you use a CPAP at night, you may bring all equipment for your overnight stay.   Contacts, glasses, dentures or bridgework may not be worn into surgery.      For patients admitted to the hospital, discharge time will be determined by your treatment team.   Patients discharged the day of surgery will not be allowed to drive home, and someone needs to stay with  them for 24 hours.    Special instructions:   [facility]- Preparing For Surgery  Before surgery, you can play an important role. Because skin is not sterile, your skin needs to be as free of germs as possible. You can reduce the number of germs on your skin by washing with CHG (chlorahexidine gluconate) Soap before surgery.  CHG is an antiseptic cleaner which kills germs and bonds with the skin to continue killing germs even after washing.    Oral Hygiene is also important to reduce your risk of infection.  Remember - BRUSH YOUR TEETH THE MORNING OF SURGERY WITH YOUR REGULAR TOOTHPASTE  Please do not use if you have an allergy to CHG or antibacterial soaps. If your skin becomes reddened/irritated stop using the CHG.  Do not shave (including legs and underarms) for at least 48 hours prior to first CHG shower. It is OK to shave your face.  Please follow these instructions carefully.   1. Shower the NIGHT BEFORE SURGERY and the MORNING OF SURGERY with CHG Soap.   2. If you chose to wash your hair, wash your hair first as usual with your normal shampoo.  3. After you shampoo, rinse your hair and body thoroughly to remove the shampoo.  4. Use CHG as you would any other liquid soap. You can apply CHG directly to the skin and wash gently with a scrungie or a clean washcloth.   5. Apply the CHG Soap to your body ONLY FROM THE NECK DOWN.  Do not use on open wounds or open sores. Avoid contact with your eyes, ears, mouth and genitals (private parts). Wash Face and genitals (private parts)  with your normal soap.   6. Wash thoroughly, paying special attention to the area where your surgery will be performed.  7. Thoroughly rinse your body with warm water from the neck down.  8. DO NOT shower/wash with your normal soap after using and rinsing off the CHG Soap.  9. Pat yourself dry with a CLEAN TOWEL.  10. Wear CLEAN PAJAMAS to bed the night before surgery, wear comfortable clothes the morning  of surgery  11. Place CLEAN SHEETS on your bed the night of your first shower and DO NOT SLEEP WITH PETS.   Day of Surgery:   Do not apply any deodorants/lotions.  Please wear clean clothes to the hospital/surgery center.   Remember to brush your teeth WITH YOUR REGULAR TOOTHPASTE.   Please read over the following fact sheets that you were given.

## 2019-07-18 ENCOUNTER — Encounter: Payer: Self-pay | Admitting: Physician Assistant

## 2019-07-18 ENCOUNTER — Other Ambulatory Visit: Payer: Self-pay | Admitting: Physician Assistant

## 2019-07-18 ENCOUNTER — Encounter: Payer: Self-pay | Admitting: Thoracic Surgery (Cardiothoracic Vascular Surgery)

## 2019-07-18 ENCOUNTER — Other Ambulatory Visit: Payer: Self-pay

## 2019-07-18 ENCOUNTER — Ambulatory Visit: Payer: Medicare Other | Attending: Cardiovascular Disease | Admitting: Physical Therapy

## 2019-07-18 ENCOUNTER — Encounter: Payer: Self-pay | Admitting: Physical Therapy

## 2019-07-18 ENCOUNTER — Encounter (HOSPITAL_COMMUNITY)
Admission: RE | Admit: 2019-07-18 | Discharge: 2019-07-18 | Disposition: A | Discharge status: Home / Self Care | Payer: Medicare Other | Source: Ambulatory Visit | Attending: Cardiovascular Disease | Admitting: Cardiovascular Disease

## 2019-07-18 ENCOUNTER — Institutional Professional Consult (permissible substitution) (INDEPENDENT_AMBULATORY_CARE_PROVIDER_SITE_OTHER): Payer: Medicare Other | Admitting: Thoracic Surgery (Cardiothoracic Vascular Surgery)

## 2019-07-18 ENCOUNTER — Other Ambulatory Visit (HOSPITAL_COMMUNITY): Payer: Medicare Other

## 2019-07-18 VITALS — BP 126/76 | HR 65 | Temp 96.6°F | Resp 20 | Ht 68.0 in | Wt 173.0 lb

## 2019-07-18 DIAGNOSIS — I208 Other forms of angina pectoris: Secondary | ICD-10-CM

## 2019-07-18 DIAGNOSIS — I2510 Atherosclerotic heart disease of native coronary artery without angina pectoris: Secondary | ICD-10-CM | POA: Diagnosis not present

## 2019-07-18 DIAGNOSIS — I350 Nonrheumatic aortic (valve) stenosis: Secondary | ICD-10-CM | POA: Insufficient documentation

## 2019-07-18 DIAGNOSIS — Z01812 Encounter for preprocedural laboratory examination: Secondary | ICD-10-CM | POA: Insufficient documentation

## 2019-07-18 DIAGNOSIS — Z7982 Long term (current) use of aspirin: Secondary | ICD-10-CM | POA: Insufficient documentation

## 2019-07-18 DIAGNOSIS — E118 Type 2 diabetes mellitus with unspecified complications: Secondary | ICD-10-CM | POA: Diagnosis not present

## 2019-07-18 DIAGNOSIS — R2689 Other abnormalities of gait and mobility: Secondary | ICD-10-CM | POA: Diagnosis not present

## 2019-07-18 DIAGNOSIS — Z79899 Other long term (current) drug therapy: Secondary | ICD-10-CM | POA: Diagnosis not present

## 2019-07-18 DIAGNOSIS — I10 Essential (primary) hypertension: Secondary | ICD-10-CM | POA: Diagnosis not present

## 2019-07-18 DIAGNOSIS — Z7901 Long term (current) use of anticoagulants: Secondary | ICD-10-CM | POA: Insufficient documentation

## 2019-07-18 NOTE — Patient Instructions (Signed)
   Continue taking all current medications without change through the day before surgery.  Make sure to bring all of your medications with you when you come for your Pre-Admission Testing appointment at [facility] Short-Stay Department.  Have nothing to eat or drink after midnight the night before surgery.  On the morning of surgery do not take any medications  At your appointment for Pre-Admission Testing at the [facility] Short-Stay Department you will be asked to sign permission forms for your upcoming surgery.  By definition your signature on these forms implies that you and/or your designee provide full informed consent for your planned surgical procedure(s), that alternative treatment options have been discussed, that you understand and accept any and all potential risks, and that you have some understanding of what to expect for your post-operative convalescence.  For any major cardiac surgical procedure potential operative risks include but are not limited to at least some risk of death, stroke or other neurologic complication, myocardial infarction, congestive heart failure, respiratory failure, renal failure, bleeding requiring blood transfusion and/or reexploration, irregular heart rhythm, heart block or bradycardia requiring permanent pacemaker, pneumonia, pericardial effusion, pleural effusion, wound infection, pulmonary embolus or other thromboembolic complication, chronic pain, or other complications related to the specific procedure(s) performed.  For transcatheter aortic valve replacement additional risks include but are not limited to risk of paravalvular leak, valve embolization, valve thrombosis, aortic dissection, aortic rupture, ventricular septal defect or perforation, pericardial tamponade, injury of the abdominal aorta or its branches, and/or injury or occlusion of the arteries going to your arms or legs.  Please call to schedule a follow-up  appointment in our office prior to surgery if you have any unresolved questions about your planned surgical procedure, the associated risks, alternative treatment options, and/or expectations for your post-operative recovery.      

## 2019-07-18 NOTE — H&P (View-Only) (Signed)
HEART AND [place] SURGERY CONSULTATION REPORT  Primary Cardiologist is [name], MD PCP is Feldpausch, Chrissie Noa, MD  Chief Complaint  Patient presents with  . Aortic Stenosis    TAVR Consultation    HPI:  Patient is an 86 year old male with history of aortic stenosis, coronary artery disease status post PCI and stenting in the remote past, hypertension, and hyperlipidemia who has been referred for surgical consultation to discuss treatment options for management of severe symptomatic aortic stenosis.  Patient's cardiac history dates back to 2008 when he first was found to have coronary artery disease associated with abnormal stress test.  He was treated with PCI and stenting of the right coronary artery at that time and has done well.  He has been followed intermittently ever since by Dr. [name].  He has developed aortic stenosis which has gradually progressed in severity on follow-up echocardiographic imaging.  Echocardiogram performed March 06, 2019 revealed severe aortic stenosis with peak velocity across aortic valve measured 4.4 m/s corresponding to mean transvalvular gradient estimated 42 mmHg and aortic valve area estimated 0.79 cm by VTI.  The DVI was reported 0.23.  Left ventricular systolic function remain normal.  Repeat echocardiogram was performed Jun 12, 2019 and confirmed the presence of severe aortic stenosis with peak velocity across aortic valve measured greater than 4.5 m/s corresponding to mean transvalvular gradient estimated 52 mmHg and aortic valve area calculated only 0.49 cm by VTI.  Left ventricular systolic function remain normal.  Patient was seen in follow-up by [name] at [facility] on Jun 18, 2019 at which time he complained of progressive symptoms of exertional shortness of breath and decreased energy.  Diagnostic cardiac catheterization was performed Jun 25, 2019 and confirmed the  presence of severe aortic stenosis with peak to peak and mean transvalvular gradients measured 45 and 44 mmHg by catheterization, respectively.  Patient was noted to have continuous patency of stent in the right coronary artery with otherwise mild nonobstructive coronary artery disease.  Left ventricular systolic function remain normal and right heart pressures were normal.  The patient was referred to the multidisciplinary heart valve clinic and has been evaluated previously by Dr. [name].  CT angiography was performed and the patient was referred for surgical consultation.  Patient is married and lives with his wife in [place].  His wife is not in good health and recently had knee surgery.  He is accompanied by his daughter for his office consultation visit today.  The patient has been retired for many years.  He has been physically active all of his adult life and only recently started to slow down physically.  He complains that over the last 6 months he has developed progressive exertional shortness of breath and decreased energy.  He denies shortness of breath with low-level activity or ordinary activities around the house, but walking up a hill he will get short of breath fairly quickly.  He has not had chest pain or chest tightness either with activity or at rest.  He denies PND, orthopnea, or lower extremity edema.  He has not had dizzy spells or syncope.  Past Medical History:  Diagnosis Date  . Angina   . Aortic valve stenosis, moderate 08/2014   Progression to moderate-severe stenosis: Echo in June 2018 showed mean gradient 30 mmHg, peak gradient 57 mmHg.  . Arthritis   . Bilateral carotid artery disease ([place])    CAROTID DOPPLER,05/21/2008 - Right and left ICA-0-49%  diameter reduction, left CCA-0-49% diameter reductiion  . BPH (benign prostatic hypertrophy)   . CAD S/P percutaneous coronary angioplasty 07/2006   PCI to RCA - Promus DES 2.5 mm x 23 mm; 2D ECHO - EF >55%, moderate  calcification of the aortic valve leaflets  . Diabetes mellitus   . GERD (gastroesophageal reflux disease)   . High cholesterol   . Hypertension    "from the diabetes"    Past Surgical History:  Procedure Laterality Date  . CARDIAC CATHETERIZATION with PCI  08/27/2006   RCA-mid - 2.5x62m Promus stent  . CATARACT EXTRACTION W/PHACO Right 03/08/2015   Procedure: CATARACT EXTRACTION PHACO AND INTRAOCULAR LENS PLACEMENT (IOC);  Surgeon: SEstill Cotta MD;  Location: [facility] ORS;  Service: Ophthalmology;  Laterality: Right;  [place] 01:29.4   . COLECTOMY  ~ 2000  . LEFT HEART CATHETERIZATION WITH CORONARY ANGIOGRAM N/A 03/22/2011   Procedure: LEFT HEART CATHETERIZATION WITH CORONARY ANGIOGRAM;  Surgeon: DLeonie Man MD;  [facility];  Service: Cardiovascular::: Patent RCA stent w/ progression of pRCA Dz to ~50-60%. Progression of oD3 lesion to 60-70% - not optimal for PCI (b/c ostial).  EF 55-60% - no RWMA. Med Rx.  Aortic valve gradient: Peak 22 mmHg, mean 13 mmHg  . NM MYOVIEW LTD  08/2014   LOW RISK. NORMAL.  EF 45-54%.   .Marland KitchenRIGHT/LEFT HEART CATH AND CORONARY ANGIOGRAPHY N/A 06/25/2019   Procedure: RIGHT/LEFT HEART CATH AND CORONARY ANGIOGRAPHY;  Surgeon: HLeonie Man MD;  [facility];  Service: Cardiovascular;  Laterality: N/A;  . TRANSTHORACIC ECHOCARDIOGRAM  04/2017   Normal LV size and function.  EF 60-65%.  GR 1 DD.  Moderate aortic stenosis with estimated valve area between 0.88-0.97 cm.  (Mean gradient 25 mmHg, peak gradient 43 mmHg)  . TRANSTHORACIC ECHOCARDIOGRAM  08/26/2015   Normal LV size with mild concentric hypertrophy. Normal EF 55-60%. No RWMA. GR 1 DD. Severe aortic stenosis (mean gradient 24 mmHg, AVA 0.75 cm) --> progression from 2016. Mild-moderate MR. Mildly dilated RV. Mildly elevated PA pressures (40 mmHg.  .Marland KitchenTRANSTHORACIC ECHOCARDIOGRAM  06/2016   EF 60-65%. GR 1 DD. Moderate to severe aortic stenosis with a mean gradient 30 mmHg, peak  gradient of 57 mmHg.   .Marland KitchenTRANSURETHRAL RESECTION OF PROSTATE  ~ 2010    Family History  Problem Relation Age of Onset  . ALS Mother   . Prostate cancer Father     Social History   Socioeconomic History  . Marital status: Married    Spouse name: Not on file  . Number of children: 2  . Years of education: Not on file  . Highest education level: Not on file  Occupational History  . Occupation: RProofreader Tobacco Use  . Smoking status: Never Smoker  . Smokeless tobacco: Former USystems developer   Types: Chew  Substance and Sexual Activity  . Alcohol use: Yes    Alcohol/week: 6.0 standard drinks    Types: 6 Glasses of wine per week  . Drug use: No  . Sexual activity: Yes  Other Topics Concern  . Not on file  Social History Narrative   Father of 2, grandfather 575   Exercise for almost 2 hours a day, doing least 20 minutes on the elliptical trainer. He does his to 4 days a week. He'll also does weights and stretching exercises.   Social Determinants of Health   Financial Resource Strain:   . Difficulty of Paying Living Expenses:  Food Insecurity:   . Worried About Charity fundraiser in the Last Year:   . Arboriculturist in the Last Year:   Transportation Needs:   . Film/video editor (Medical):   Marland Kitchen Lack of Transportation (Non-Medical):   Physical Activity:   . Days of Exercise per Week:   . Minutes of Exercise per Session:   Stress:   . Feeling of Stress :   Social Connections:   . Frequency of Communication with Friends and Family:   . Frequency of Social Gatherings with Friends and Family:   . Attends Religious Services:   . Active Member of Clubs or Organizations:   . Attends Archivist Meetings:   Marland Kitchen Marital Status:   Intimate Partner Violence:   . Fear of Current or Ex-Partner:   . Emotionally Abused:   Marland Kitchen Physically Abused:   . Sexually Abused:     Current Outpatient Medications  Medication Sig Dispense Refill  . acetaminophen  (TYLENOL) 500 MG tablet Take 500 mg by mouth every 6 (six) hours as needed for moderate pain or headache.    . Artificial Tear Solution (GENTEAL TEARS OP) Place 1 drop into both eyes daily.    Marland Kitchen ascorbic acid (VITAMIN C) 500 MG tablet Take 1,000-1,500 mg by mouth See admin instructions. Take 1000 mg in the morning and 1500 mg at night    . aspirin EC 81 MG tablet Take 81 mg by mouth daily.    . benzonatate (TESSALON) 200 MG capsule Take 200 mg by mouth 3 (three) times daily as needed for cough.    . bismuth subsalicylate (PEPTO BISMOL) 262 MG/15ML suspension Take 30 mLs by mouth every 6 (six) hours as needed for indigestion or diarrhea or loose stools.    . calcium carbonate (OS-CAL) 600 MG TABS tablet Take 600 mg by mouth daily.    . cetirizine (ZYRTEC) 10 MG tablet Take 10 mg by mouth daily.    . Cholecalciferol (VITAMIN D3) 2000 UNITS TABS Take 4,000 Units by mouth 2 (two) times daily.     Marland Kitchen CINNAMON PO Take 1,000 mg by mouth 2 (two) times daily.    . Coenzyme Q10 (COQ-10) 100 MG CAPS Take 100 mg by mouth daily.    . Cyanocobalamin (B-12) 2500 MCG TABS Take 2,500 mcg by mouth daily.    Marland Kitchen donepezil (ARICEPT) 10 MG tablet Take 10 mg by mouth daily.    Marland Kitchen EPIPEN 2-PAK 0.3 MG/0.3ML SOAJ injection Inject 0.3 mLs as directed as needed for anaphylaxis.     Marland Kitchen ezetimibe (ZETIA) 10 MG tablet Take 10 mg by mouth daily.    . fluticasone (FLONASE) 50 MCG/ACT nasal spray Place 2 sprays into both nostrils 2 (two) times daily as needed for allergies.     Marland Kitchen gabapentin (NEURONTIN) 100 MG capsule Take 300 mg by mouth at bedtime.     Marland Kitchen glipiZIDE (GLUCOTROL XL) 2.5 MG 24 hr tablet Take 2.5 mg by mouth daily with breakfast.    . Glucosamine HCl 1000 MG TABS Take 1,000 mg by mouth 2 (two) times daily.    Marland Kitchen ipratropium (ATROVENT) 0.03 % nasal spray Place 2 sprays into both nostrils 2 (two) times daily as needed for rhinitis.    Marland Kitchen loperamide (IMODIUM A-D) 2 MG tablet Take 2 mg by mouth daily as needed for diarrhea or  loose stools.    . methocarbamol (ROBAXIN) 500 MG tablet Take 500 mg by mouth at bedtime as needed for muscle  spasms.    . metoprolol succinate (TOPROL-XL) 25 MG 24 hr tablet Take 1 tablet (25 mg total) by mouth daily. 90 tablet 3  . Multiple Vitamin (MULITIVITAMIN WITH MINERALS) TABS Take 1 tablet by mouth 2 (two) times daily.     . Omega 3 1200 MG CAPS Take 2,400 mg by mouth 2 (two) times daily.     . Probiotic CAPS Take 1 capsule by mouth daily.    . pseudoephedrine (SUDAFED) 30 MG tablet Take 60 mg by mouth every 4 (four) hours as needed for congestion.    . Pumpkin Seed-Soy Germ (AZO BLADDER CONTROL/GO-LESS PO) Take 1 tablet by mouth daily.    . Saw Palmetto 450 MG CAPS Take 900 mg by mouth daily.     . simvastatin (ZOCOR) 40 MG tablet Take 40 mg by mouth at bedtime.    . triamcinolone cream (KENALOG) 0.1 % Apply 1 application topically 3 (three) times daily as needed for itching.    . zinc gluconate 50 MG tablet Take 50 mg by mouth daily.     No current facility-administered medications for this visit.    Allergies  Allergen Reactions  . Azithromycin Rash and Anaphylaxis  . Cephalosporins Anaphylaxis and Other (See Comments)  . Codeine Anaphylaxis, Itching and Rash  . Doxycycline Anaphylaxis  . Phenylephrine-Guaifenesin     Unknown reaction   . Pseudoephedrine Other (See Comments)    unknown   . Amoxicillin Rash  . Levofloxacin Rash  . Penicillins Rash    Other reaction(s): Unknown  . Sulfa Antibiotics Rash  . Sulfasalazine Rash      Review of Systems:   General:  normal appetite, decreased energy, no weight gain, no weight loss, no fever  Cardiac:  no chest pain with exertion, no chest pain at rest, +SOB with exertion, no resting SOB, no PND, no orthopnea, no palpitations, no arrhythmia, no atrial fibrillation, no LE edema, no dizzy spells, no syncope  Respiratory:  + exertional shortness of breath, no home oxygen, no productive cough, no dry cough, no bronchitis, no  wheezing, no hemoptysis, no asthma, no pain with inspiration or cough, no sleep apnea, no CPAP at night  GI:   no difficulty swallowing, + reflux, + frequent heartburn, no hiatal hernia, no abdominal pain, no constipation, no diarrhea, no hematochezia, no hematemesis, no melena  GU:   no dysuria,  no frequency, no urinary tract infection, no hematuria, no enlarged prostate, no kidney stones, no kidney disease  Vascular:  no pain suggestive of claudication, no pain in feet, + leg cramps, no varicose veins, no DVT, no non-healing foot ulcer  Neuro:   no stroke, no TIA's, no seizures, no headaches, no temporary blindness one eye,  no slurred speech, no peripheral neuropathy, no chronic pain, no instability of gait, mild short term memory/cognitive dysfunction  Musculoskeletal: + arthritis, no joint swelling, no myalgias, no difficulty walking, normal mobility   Skin:   no rash, no itching, no skin infections, no pressure sores or ulcerations  Psych:   no anxiety, no depression, no nervousness, no unusual recent stress  Eyes:   no blurry vision, no floaters, no recent vision changes, does not wear glasses or contacts  ENT:   + hearing loss, no loose or painful teeth, no dentures, last saw dentist 6 months ago  Hematologic:  no easy bruising, no abnormal bleeding, no clotting disorder, no frequent epistaxis  Endocrine:  + diabetes, does check CBG's at home  Physical Exam:   BP 126/76 (BP Location: Right Arm, Patient Position: Sitting, Cuff Size: Normal)   Pulse 65   Temp (!) 96.6 F (35.9 C) (Temporal)   Resp 20   Ht _0  (1.727 m)   Wt 173 lb (78.5 kg)   SpO2 95% Comment: RA  BMI 26.30 kg/m   General:    well-appearing  HEENT:  Unremarkable   Neck:   no JVD, no bruits, no adenopathy   Chest:   clear to auscultation, symmetrical breath sounds, no wheezes, no rhonchi   CV:   RRR, grade IV/VI crescendo/decrescendo murmur heard best at LLSB,  no diastolic murmur  Abdomen:  soft,  non-tender, no masses   Extremities:  warm, well-perfused, pulses palpable, no LE edema  Rectal/GU  Deferred  Neuro:   Grossly non-focal and symmetrical throughout  Skin:   Clean and dry, no rashes, no breakdown   Diagnostic Tests:  EKG: NSR w/out significant AV conduction delay    ECHOCARDIOGRAM REPORT       Patient Name:  EMERSON SCHREIFELS Date of Exam: 06/12/2019  Medical Rec #: [id number]   Height:    68.0 in  Accession #:  [id number]   Weight:    172.8 lb  Date of Birth: July 21, 1931   BSA:     1.921 m  Patient Age:  52 years    BP:      129/76 mmHg  Patient Gender: M       HR:      63 bpm.  Exam Location: Outpatient   Procedure: 2D Echo, Color Doppler and Cardiac Doppler   Indications:  Aortic Stenosis i35.0    History:    Patient has prior history of Echocardiogram examinations,  most         recent 03/07/2019. CAD; Risk Factors:Hypertension, Diabetes  and         Dyslipidemia.    Sonographer:  Raquel Sarna Senior RDCS  Referring Phys: [place]    1. Since prior ECHO, aortic stenosis has increased to severe.  2. Left ventricular ejection fraction, by estimation, is 60 to 65%. The  left ventricle has normal function. The left ventricle has no regional  wall motion abnormalities. There is mild asymmetric left ventricular  hypertrophy of the basal-septal segment.  Left ventricular diastolic parameters are consistent with Grade I  diastolic dysfunction (impaired relaxation). Elevated left atrial  pressure.  3. Right ventricular systolic function is normal. The right ventricular  size is normal. There is normal pulmonary artery systolic pressure. The  estimated right ventricular systolic pressure is 86.5 mmHg.  4. Left atrial size was moderately dilated.  5. The mitral valve is normal in structure. Moderate mitral valve  regurgitation. No evidence of mitral stenosis.    6. Tricuspid valve regurgitation is moderate.  7. The aortic valve is tricuspid. Aortic valve regurgitation is not  visualized. Severe aortic valve stenosis. Aortic valve area, by VTI  measures 0.49 cm. Aortic valve mean gradient measures 52.5 mmHg. Aortic  valve Vmax measures 4.56 m/s.  8. The inferior vena cava is normal in size with greater than 50%  respiratory variability, suggesting right atrial pressure of 3 mmHg.   FINDINGS  Left Ventricle: Left ventricular ejection fraction, by estimation, is 60  to 65%. The left ventricle has normal function. The left ventricle has no  regional wall motion abnormalities. The left ventricular internal cavity  size was normal in size. There is  mild asymmetric left  ventricular hypertrophy of the basal-septal segment.  Left ventricular diastolic parameters are consistent with Grade I  diastolic dysfunction (impaired relaxation). Elevated left atrial  pressure.   Right Ventricle: The right ventricular size is normal. No increase in  right ventricular wall thickness. Right ventricular systolic function is  normal. There is normal pulmonary artery systolic pressure. The tricuspid  regurgitant velocity is 2.59 m/s, and  with an assumed right atrial pressure of 3 mmHg, the estimated right  ventricular systolic pressure is 16.1 mmHg.   Left Atrium: Left atrial size was moderately dilated.   Right Atrium: Right atrial size was normal in size.   Pericardium: There is no evidence of pericardial effusion.   Mitral Valve: The mitral valve is normal in structure. Normal mobility of  the mitral valve leaflets. Moderate mitral valve regurgitation. No  evidence of mitral valve stenosis.   Tricuspid Valve: The tricuspid valve is normal in structure. Tricuspid  valve regurgitation is moderate . No evidence of tricuspid stenosis.   Aortic Valve: The aortic valve is tricuspid. . There is severe thickening  and severe calcifcation of the aortic  valve. Aortic valve regurgitation is  not visualized. Severe aortic stenosis is present. There is severe  thickening of the aortic valve. There  is severe calcifcation of the aortic valve. Aortic valve mean gradient  measures 52.5 mmHg. Aortic valve peak gradient measures 83.0 mmHg. Aortic  valve area, by VTI measures 0.49 cm.   Pulmonic Valve: The pulmonic valve was normal in structure. Pulmonic valve  regurgitation is trivial. No evidence of pulmonic stenosis.   Aorta: The aortic root is normal in size and structure.   Venous: The inferior vena cava is normal in size with greater than 50%  respiratory variability, suggesting right atrial pressure of 3 mmHg.   IAS/Shunts: No atrial level shunt detected by color flow Doppler.   Additional Comments: Since prior ECHO, aortic stenosis has increased to  severe.     LEFT VENTRICLE  PLAX 2D  LVIDd:     4.40 cm Diastology  LVIDs:     2.60 cm LV e' lateral:  5.98 cm/s  LV PW:     1.00 cm LV E/e' lateral: 15.2  LV IVS:    1.30 cm LV e' medial:  4.90 cm/s  LVOT diam:   2.10 cm LV E/e' medial: 18.5  LV SV:     56  LV SV Index:  29  LVOT Area:   3.46 cm     RIGHT VENTRICLE  RV S prime:   7.94 cm/s  TAPSE (M-mode): 2.4 cm   LEFT ATRIUM       Index    RIGHT ATRIUM      Index  LA diam:    3.80 cm 1.98 cm/m RA Area:   17.80 cm  LA Vol (A2C):  80.1 ml 41.70 ml/m RA Volume:  44.50 ml 23.17 ml/m  LA Vol (A4C):  52.0 ml 27.07 ml/m  LA Biplane Vol: 70.8 ml 36.86 ml/m  AORTIC VALVE  AV Area (Vmax):  0.51 cm  AV Area (Vmean):  0.50 cm  AV Area (VTI):   0.49 cm  AV Vmax:      455.50 cm/s  AV Vmean:     342.000 cm/s  AV VTI:      1.135 m  AV Peak Grad:   83.0 mmHg  AV Mean Grad:   52.5 mmHg  LVOT Vmax:     66.60 cm/s  LVOT Vmean:    49.100  cm/s  LVOT VTI:     0.161 m  LVOT/AV VTI ratio: 0.14    AORTA  Ao Root diam: 3.20  cm  Ao Asc diam: 3.30 cm   MITRAL VALVE        TRICUSPID VALVE  MV Area (PHT): 3.27 cm   TR Peak grad:  26.8 mmHg  MV Decel Time: 232 msec   TR Vmax:    259.00 cm/s  MV E velocity: 90.80 cm/s  MV A velocity: 127.00 cm/s SHUNTS  MV E/A ratio: 0.71     Systemic VTI: 0.16 m               Systemic Diam: 2.10 cm   Candee Furbish MD  Electronically signed by Candee Furbish MD  Signature Date/Time: 06/12/2019/1:52:11 PM       RIGHT/LEFT HEART CATH AND CORONARY ANGIOGRAPHY  Conclusion    LV end diastolic pressure is normal.  Right heart cath pressures are normal  There is severe aortic valve stenosis. Mean gradient 44.2 mmHg  Prox RCA lesion is 60% stenosed. Stable from prior cath  Previously placed Prox RCA to Mid RCA stent (unknown type) is widely patent.  2nd Diag lesion is 70% stenosed. 3rd Diag lesion is 50% stenosed. -Both lesions are not favorable for PCI. Also stable.   SUMMARY  Confirmation of severe aortic stenosis with mean gradient 44.2 mmHg.  Peak to peak 45 mmHg. (By echo was 52 mmHg)  Stable coronary arteries with 50 to 60% proximal RCA followed by brief ectatic segment and then widely patent mid stent.  Otherwise small caliber 1st Diag ~70% ostial stenosis.   Normal right heart cath numbers.   Will refer to valve clinic for TAVR evaluation.    [name], MD   Recommendations  Antiplatelet/Anticoag Recommend Aspirin 3m daily for moderate CAD.  Discharge Date In the absence of any other complications or medical issues, we expect the patient to be ready for discharge from a cath perspective on 06/25/2019. Patient will need to be scheduled to follow-up in valve clinic prior to discharge.  Indications  Severe calcific aortic stenosis [I35.0 (ICD-10-CM)]  CAD S/P percutaneous coronary angioplasty [I25.10, Z98.61 (ICD-10-CM)]  Procedural Details  Technical Details PCP:  FSofie Hartigan MD             Cardiologist: Dr. HEllyn Hack KJory Sims DNP  Keahi L WClydene Laming("Cotton") is a 86y.o. male with history of CAD having PCI to RCA in 2008 (most recent cath in 2013 showed patent RCA stent with proximal RCA 50-60% and ostial small caliber D2 and D3-not PCI targets) who has been followed for progression of his aortic stenosis.  As of March 2021 mean gradient it progressed to roughly 44 mmHg, with no symptoms.  Unfortunately, he was just seen by KJory Sims NP earlier this month with significant progression of exertional dyspnea.  Repeat echocardiogram 06/12/2019 revealed progression of severe aortic stenosis --> aortic valve area, by VTI measuring 0.49 cm.  Aortic valve mean gradient measures 52.5 mmHg.  Aortic valve V-max measured 4.56 m/s.  He was seen by KJory Simsas a result of his echocardiogram with progression of disease to discuss right and left heart catheterization.  Time Out: Verified patient identification, verified procedure, site/side was marked, verified correct patient position, special equipment/implants available, medications/allergies/relevent history reviewed, required imaging and test results available. Performed.  Access:  Modified Seldinger technique using Micropuncture Kit used for both venous and arterial access, Direct ultrasound guidance used.  Permanent image obtained  and placed on chart  *  RIGHT Radial Artery: 6 Fr sheath --.10 mL radial cocktail IA; 4000 units IV Heparin * RIGHT Brachial Vein: 5 Fr sheath  Right Heart Catheterization: 5 Fr Swan Ganz catheter advanced under fluoroscopy with balloon inflated to the RA, RV, then PCWP-PA for hemodynamic measurement.  * Simultaneous FA & PA blood gases checked for SaO2% to calculate FICK CO/CI  * Catheter removed completely out of the body with balloon deflated.  Left Heart Catheterization: 5 Fr Catheters advanced or exchanged over a J-wire under direct fluoroscopic guidance into the ascending aorta; TIG 4.0  catheter advanced first.  * LV Hemodynamics (LV Gram): Aortic valve crossed using AL-1 catheter and straight wire, exchanged over J-wire for TIG 4.0 catheter.  No LV gram * Left & Right Coronary Artery Cineangiography: TIG 4.0 catheter   Upon completion of Angiogaphy, the catheter was removed completely out of the body over a wire, without complication.  Brachial Sheath(s) removed in the Cath Lab with manual pressure for hemostasis.    Radial sheath removed in the Cardiac Catheterization lab with TR Band placed for hemostasis.  TR Band: 1130 Hours; 14 mL air  MEDICATIONS * SQ Lidocaine 69m * Radial Cocktail: 3 mg Verapmil in 10 mL NS * Isovue Contrast: 40 mL * Heparin: 4000 units Estimated blood loss <50 mL.   During this procedure medications were administered to achieve and maintain moderate conscious sedation while the patient's heart rate, blood pressure, and oxygen saturation were continuously monitored and I was present face-to-face 100% of this time.  Medications (Filter: Administrations occurring from [phone] [phone] on 06/25/19) Heparin (Porcine) in NaCl 1000-0.9 UT/500ML-% SOLN (mL) Total volume:  1,000 mL Date/Time  Rate/Dose/Volume Action  06/25/19 1003  500 mL Given  1003  500 mL Given    midazolam (VERSED) injection (mg) Total dose:  0.5 mg Date/Time  Rate/Dose/Volume Action  06/25/19 1009  0.5 mg Given    lidocaine (PF) (XYLOCAINE) 1 % injection (mL) Total volume:  4 mL Date/Time  Rate/Dose/Volume Action  06/25/19 1039  2 mL Given  1043  2 mL Given    Radial Cocktail/Verapamil only (mL) Total volume:  10 mL Date/Time  Rate/Dose/Volume Action  06/25/19 1045  10 mL Given    heparin sodium (porcine) injection (Units) Total dose:  4,000 Units Date/Time  Rate/Dose/Volume Action  06/25/19 1058  4,000 Units Given    iohexol (OMNIPAQUE) 350 MG/ML injection (mL) Total volume:  40 mL Date/Time  Rate/Dose/Volume Action  06/25/19 1123  40 mL Given    Sedation  Time  Sedation Time Physician-1: 1 hour 11 minutes 59 seconds  Contrast  Medication Name Total Dose  iohexol (OMNIPAQUE) 350 MG/ML injection 40 mL    Radiation/Fluoro  Fluoro time: 11.6 (min) DAP: 13.6 (Gycm2) Cumulative Air Kerma: 2201.0(mGy)  Complications  Complications documented before study signed (06/25/2019 107:12PM)   No complications were associated with this study.  Documented by HLeonie Man MD - 06/25/2019 11:40 AM    Coronary Findings  Diagnostic Dominance: Right Left Main  Vessel was injected. Vessel is large. Vessel is angiographically normal.  Left Anterior Descending  Vessel is angiographically normal. The vessel is tortuous.  First Diagonal Branch  Vessel is large in size. Vessel is angiographically normal.  Second Diagonal Branch  Vessel is small in size.  2nd Diag lesion is 70% stenosed. The lesion is focal, discrete and concentric.  Third Diagonal Branch  Vessel is moderate in size.  3rd  Diag lesion is 50% stenosed. The lesion is focal, discrete and concentric.  Left Circumflex  Vessel is large.  First Obtuse Marginal Branch  Vessel is moderate in size. Vessel is angiographically normal.  Second Obtuse Marginal Branch  Vessel is large in size. Vessel is angiographically normal. The vessel is tortuous.  First Left Posterolateral Branch  Vessel is moderate in size.  Right Coronary Artery  The vessel is mildly tortuous. The vessel is mildly ectatic. Focal ectasia after focal proximal stenosis  Prox RCA lesion is 60% stenosed. The lesion is located at the bend, focal and discrete. Followed by Po stenotic ectasia  Previously placed Prox RCA to Mid RCA stent (unknown type) is widely patent.  Right Ventricular Branch  Vessel is small in size.  Right Posterior Atrioventricular Artery  Vessel is small in size.  Intervention  No interventions have been documented. Right Heart  Right Heart Pressures PAP-mean: 22/3 mmHg - 12 mmHg PCWP 4 mmHg LV  EDP is normal. LV P-EDP: 152/2 mmHg - 10 mmHg AoP-MAP: 107/53 mmHg - 75 mmHg Ao sat 99%, PA sat 74%. CARDIAC OUTPUT,INDEX (Fick): 5.48, 2.88  Right Atrium Right atrial pressure is normal. RAP 2 mmHg  Right Ventricle RVP-EDP: 21/1 mmHg - 3 mmHg  Wall Motion  Resting    No LV gram performed. By ECHO EF 60-65% with no R WMA.        Left Heart  Left Ventricle LV end diastolic pressure is normal.  Aortic Valve There is severe aortic valve stenosis. The aortic valve is calcified. There is restricted aortic valve motion. By ECHO mean gradient 52.5 mmHg By cath mean gradient 44.2 mmHg, P-P 45 million mercury.  Coronary Diagrams  Diagnostic Dominance: Right  Intervention  Implants   No implant documentation for this case.  Syngo Images  Show images for CARDIAC CATHETERIZATION Images on Long Term Storage  Show images for Clydene Laming, Jaylyn L Link to Procedure Log  Procedure Log    Hemo Data   Most Recent Value  Fick Cardiac Output 5.48 L/min  Fick Cardiac Output Index 2.88 (L/min)/BSA  Aortic Mean Gradient 44.24 mmHg  Aortic Peak Gradient 45 mmHg  Aortic Valve Area 0.92  Aortic Value Area Index 0.48 cm2/BSA  RA A Wave 3 mmHg  RA V Wave 1 mmHg  RA Mean 1 mmHg  RV Systolic Pressure 21 mmHg  RV Diastolic Pressure 0 mmHg  RV EDP 3 mmHg  PA Systolic Pressure 22 mmHg  PA Diastolic Pressure 3 mmHg  PA Mean 12 mmHg  PW A Wave 5 mmHg  PW V Wave 4 mmHg  PW Mean 4 mmHg  AO Systolic Pressure 884 mmHg  AO Diastolic Pressure 59 mmHg  AO Mean 79 mmHg  LV Systolic Pressure 166 mmHg  LV Diastolic Pressure 2 mmHg  LV EDP 10 mmHg  AOp Systolic Pressure 063 mmHg  AOp Diastolic Pressure 53 mmHg  AOp Mean Pressure 75 mmHg  LVp Systolic Pressure 016 mmHg  LVp Diastolic Pressure 2 mmHg  LVp EDP Pressure 9 mmHg  QP/QS 1  TPVR Index 4.17 HRUI  TSVR Index 27.49 HRUI  PVR SVR Ratio 0.1  TPVR/TSVR Ratio 0.15      CT ANGIOGRAPHY CHEST, ABDOMEN AND PELVIS  TECHNIQUE: Non-contrast  CT of the chest was initially obtained.  Multidetector CT imaging through the chest, abdomen and pelvis was performed using the standard protocol during bolus administration of intravenous contrast. Multiplanar reconstructed images and MIPs were obtained and reviewed to evaluate the vascular anatomy.  CONTRAST:  12m OMNIPAQUE IOHEXOL 350 MG/ML SOLN  COMPARISON:  None.  FINDINGS: CTA CHEST FINDINGS  Cardiovascular: Heart size is normal. There is no significant pericardial fluid, thickening or pericardial calcification. There is aortic atherosclerosis, as well as atherosclerosis of the great vessels of the mediastinum and the coronary arteries, including calcified atherosclerotic plaque in the left main, left anterior descending, left circumflex and right coronary arteries. Severe thickening and calcification of the aortic valve. Severe calcifications of the mitral annulus.  Mediastinum/Lymph Nodes: No pathologically enlarged mediastinal or hilar lymph nodes. Esophagus is unremarkable in appearance. No axillary lymphadenopathy.  Lungs/Pleura: No suspicious appearing pulmonary nodules or masses are noted. No acute consolidative airspace disease. No pleural effusions.  Musculoskeletal/Soft Tissues: There are no aggressive appearing lytic or blastic lesions noted in the visualized portions of the skeleton.  CTA ABDOMEN AND PELVIS FINDINGS  Hepatobiliary: 1 cm low-attenuation lesion in segment 4A of the liver, compatible with a simple cyst. No other suspicious hepatic lesions. No intra or extrahepatic biliary ductal dilatation. Tiny calcified gallstones lying dependently in the gallbladder. No findings to suggest an acute cholecystitis at this time.  Pancreas: No pancreatic mass. No pancreatic ductal dilatation. No pancreatic or peripancreatic fluid collections or inflammatory changes.  Spleen: Unremarkable.  Adrenals/Urinary Tract: 8.4 cm low-attenuation  lesion in the interpolar region of the right kidney, compatible with a large simple cyst. 2 cm simple cyst in the interpolar region of the left kidney. Subcentimeter low-attenuation lesion in the upper pole of the right kidney, too small to characterize, but statistically likely to represent a tiny cyst. 4 mm nonobstructive calculus in the lower pole collecting system of the right kidney. Bilateral adrenal glands are normal in appearance. No hydroureteronephrosis. Urinary bladder is unremarkable in appearance.  Stomach/Bowel: Normal appearance of the stomach is normal. No pathologic dilatation of small bowel or colon. Postoperative changes of partial colectomy. Numerous colonic diverticulae are noted, without surrounding inflammatory changes to suggest an acute diverticulitis at this time. Normal appendix.  Vascular/Lymphatic: Aortic atherosclerosis, without evidence of aneurysm or dissection in the abdominal or pelvic vasculature. No lymphadenopathy noted in the abdomen or pelvis.  Reproductive: Postoperative changes of TURP are noted in the prostate gland. Seminal vesicles are unremarkable in appearance.  Other: No significant volume of ascites.  No pneumoperitoneum.  Musculoskeletal: There are no aggressive appearing lytic or blastic lesions noted in the visualized portions of the skeleton.  VASCULAR MEASUREMENTS PERTINENT TO TAVR:  AORTA:  Minimal Aortic Diameter-17 x 14 mm  Severity of Aortic Calcification-severe  RIGHT PELVIS:  Right Common Iliac Artery -  Minimal Diameter-11.3 x 11.0 mm  Tortuosity-mild-to-moderate  Calcification-moderate  Right External Iliac Artery -  Minimal Diameter-9.4 x 9.4 mm  Tortuosity-moderate  Calcification-mild  Right Common Femoral Artery -  Minimal Diameter-9.1 x 9.4 mm  Tortuosity-mild  Calcification-mild  LEFT PELVIS:  Left Common Iliac Artery -  Minimal Diameter-11.7 x 10.5  mm  Tortuosity-mild-to-moderate  Calcification-moderate  Left External Iliac Artery -  Minimal Diameter-9.2 x 9.1 mm  Tortuosity-moderate to severe  Calcification-mild  Left Common Femoral Artery -  Minimal Diameter-8.9 x 8.5 mm  Tortuosity-mild  Calcification-minimal  Review of the MIP images confirms the above findings.  IMPRESSION: 1. Vascular findings and measurements pertinent to potential TAVR procedure, as detailed above. 2. Severe thickening calcification of the aortic valve, compatible with the reported clinical history of severe aortic stenosis. 3. There is also severe thickening calcification of the mitral annulus. 4. Aortic atherosclerosis, in addition to left main  and 3 vessel coronary artery disease. 5. Colonic diverticulosis without evidence of acute diverticulitis at this time. 6. Additional incidental findings, as above.   Electronically Signed   By: Vinnie Langton M.D.   On: 07/16/2019 16:01   Impression:  Patient has stage D1 severe symptomatic aortic stenosis.  He describes a 71-monthhistory of progressive symptoms of exertional shortness of breath and fatigue consistent with chronic diastolic congestive heart failure, New York Heart Association functional class II.  I have personally reviewed the patient's recent transthoracic echocardiogram, diagnostic cardiac catheterization, and CT angiograms.  Echocardiogram reveals severe aortic stenosis with preserved left ventricular function.  The aortic valve is trileaflet with severe thickening, calcification, and restricted leaflet mobility involving all 3 leaflets across aortic valve.  Peak velocity across aortic valve measured greater than 4.5 m/s corresponding to mean transvalvular gradient greater than 50 mmHg.  Diagnostic cardiac catheterization confirmed the presence of severe aortic stenosis and revealed continued patency in the stent in the right coronary artery.  There is otherwise  mild nonobstructive coronary artery disease.  Right heart pressures were normal.    I agree the patient should undergo aortic valve replacement.  Risks associated with conventional surgery will be slightly elevated because of the patient's advanced age and under the circumstances he might better be treated with transcatheter aortic valve replacement.  CT angiography of the abdomen pelvis reveals what appears to be adequate pelvic vascular access to perform transcatheter aortic valve replacement via percutaneous transfemoral approach.  Unfortunately, cardiac gated CT angiogram of the heart was associated with severe motion artifact and does not appear to be satisfactory quality for preprocedural planning.   Plan:  The patient and his daughter were counseled at length regarding treatment alternatives for management of severe symptomatic aortic stenosis. Alternative approaches such as conventional aortic valve replacement, transcatheter aortic valve replacement, and continued medical therapy without intervention were compared and contrasted at length.  The risks associated with conventional surgical aortic valve replacement were discussed in detail, as were expectations for post-operative convalescence, and why I would favor transcatheter aortic valve replacement over conventional surgery.  Issues specific to transcatheter aortic valve replacement were discussed including questions about long term valve durability, the potential for paravalvular leak, possible increased risk of need for permanent pacemaker placement, and other technical complications related to the procedure itself.  Long-term prognosis with medical therapy was discussed. This discussion was placed in the context of the patient's own specific clinical presentation and past medical history.  All of their questions have been addressed.  The patient is eager to proceed with transcatheter aortic valve replacement as long it is felt to be technically  feasible.  However, his procedure will be postponed to facilitate repeat gated CT angiogram of the heart.  We tentatively plan to proceed with surgery on July 29, 2019.  Following the decision to proceed with transcatheter aortic valve replacement, a discussion has been held regarding what types of management strategies would be attempted intraoperatively in the event of life-threatening complications, including whether or not the patient would be considered a candidate for the use of cardiopulmonary bypass and/or conversion to open sternotomy for attempted surgical intervention.  The patient specifically requests that should a potentially life-threatening complication develop we would attempt emergency median sternotomy and/or other aggressive surgical procedures if the patient were to develop intraoperative complications that were potentially correctable with surgical intervention.  The patient has been advised of a variety of complications that might develop including but not limited to  risks of death, stroke, paravalvular leak, aortic dissection or other major vascular complications, aortic annulus rupture, device embolization, cardiac rupture or perforation, mitral regurgitation, acute myocardial infarction, arrhythmia, heart block or bradycardia requiring permanent pacemaker placement, congestive heart failure, respiratory failure, renal failure, pneumonia, infection, other late complications related to structural valve deterioration or migration, or other complications that might ultimately cause a temporary or permanent loss of functional independence or other long term morbidity.  The patient provides full informed consent for the procedure as described and all questions were answered.     I spent in excess of 90 minutes during the conduct of this office consultation and >50% of this time involved direct face-to-face encounter with the patient for counseling and/or coordination of their  care.     Valentina Gu. Roxy Manns, MD 07/18/2019 12:49 PM

## 2019-07-18 NOTE — Therapy (Signed)
[place], [place], [address] Phone: [phone]   Fax:  [phone]  Physical Therapy Treatment  Patient Details  Name: Victor [name] MRN: [id number] Date of Birth: 08-10-1931 Referring Provider (PT): Dr Lauree Chandler    Encounter Date: 07/18/2019   PT End of Session - 07/18/19 1037    Visit Number 1    Number of Visits 1    Date for PT Re-Evaluation 07/18/19    PT Start Time 0930    PT Stop Time 0955    PT Time Calculation (min) 25 min    Activity Tolerance Patient tolerated treatment well    Behavior During Therapy [facility] for tasks assessed/performed           Past Medical History:  Diagnosis Date  . Angina   . Aortic valve stenosis, moderate 08/2014   Progression to moderate-severe stenosis: Echo in June 2018 showed mean gradient 30 mmHg, peak gradient 57 mmHg.  . Arthritis   . Bilateral carotid artery disease ([place])    CAROTID DOPPLER,05/21/2008 - Right and left ICA-0-49% diameter reduction, left CCA-0-49% diameter reductiion  . BPH (benign prostatic hypertrophy)   . CAD S/P percutaneous coronary angioplasty 07/2006   PCI to RCA - Promus DES 2.5 mm x 23 mm; 2D ECHO - EF >55%, moderate calcification of the aortic valve leaflets  . Diabetes mellitus   . GERD (gastroesophageal reflux disease)   . High cholesterol   . Hypertension    "from the diabetes"    Past Surgical History:  Procedure Laterality Date  . CARDIAC CATHETERIZATION with PCI  08/27/2006   RCA-mid - 2.5x5mm Promus stent  . CATARACT EXTRACTION W/PHACO Right 03/08/2015   Procedure: CATARACT EXTRACTION PHACO AND INTRAOCULAR LENS PLACEMENT (IOC);  Surgeon: Estill Cotta, MD;  Location: [facility] ORS;  Service: Ophthalmology;  Laterality: Right;  [place]: 01:29.4   . COLECTOMY  ~ 2000  . LEFT HEART CATHETERIZATION WITH CORONARY ANGIOGRAM N/A 03/22/2011   Procedure: LEFT HEART CATHETERIZATION WITH CORONARY ANGIOGRAM;  Surgeon: Leonie Man, MD;  Location: [facility] CATH LAB;  Service: Cardiovascular::: Patent RCA stent w/ progression of pRCA Dz to ~50-60%. Progression of oD3 lesion to 60-70% - not optimal for PCI (b/c ostial).  EF 55-60% - no RWMA. Med Rx.  Aortic valve gradient: Peak 22 mmHg, mean 13 mmHg  . NM MYOVIEW LTD  08/2014   LOW RISK. NORMAL.  EF 45-54%.   Marland Kitchen RIGHT/LEFT HEART CATH AND CORONARY ANGIOGRAPHY N/A 06/25/2019   Procedure: RIGHT/LEFT HEART CATH AND CORONARY ANGIOGRAPHY;  Surgeon: Leonie Man, MD;  Location: [facility] CV LAB;  Service: Cardiovascular;  Laterality: N/A;  . TRANSTHORACIC ECHOCARDIOGRAM  04/2017   Normal LV size and function.  EF 60-65%.  GR 1 DD.  Moderate aortic stenosis with estimated valve area between 0.88-0.97 cm.  (Mean gradient 25 mmHg, peak gradient 43 mmHg)  . TRANSTHORACIC ECHOCARDIOGRAM  08/26/2015   Normal LV size with mild concentric hypertrophy. Normal EF 55-60%. No RWMA. GR 1 DD. Severe aortic stenosis (mean gradient 24 mmHg, AVA 0.75 cm) --> progression from 2016. Mild-moderate MR. Mildly dilated RV. Mildly elevated PA pressures (40 mmHg.  Marland Kitchen TRANSTHORACIC ECHOCARDIOGRAM  06/2016   EF 60-65%. GR 1 DD. Moderate to severe aortic stenosis with a mean gradient 30 mmHg, peak gradient of 57 mmHg.   Marland Kitchen TRANSURETHRAL RESECTION OF PROSTATE  ~ 2010    There were no vitals filed for this visit.   Subjective Assessment -  07/18/19 0936    Subjective Patient has had a heart murmur for several years. About 3-4 monnths agio he began noticing fatigue and dyspnea when walking across his yard.    Currently in Pain? No/denies              [facility] PT Assessment - 07/18/19 0001      Assessment   Medical Diagnosis Severe Aortic Stenosis     Referring Provider (PT) Dr Lauree Chandler     Onset Date/Surgical Date --   4 months prior    Hand Dominance Right    Next MD Visit Today     Prior Therapy n      Precautions   Precautions None      Restrictions   Weight Bearing Restrictions  No      Balance Screen   Has the patient fallen in the past 6 months No    Has the patient had a decrease in activity level because of a fear of falling?  No    Is the patient reluctant to leave their home because of a fear of falling?  No      Home Environment   Additional Comments Steps into the house.       Prior Function   Level of Independence Independent    Vocation Retired      Charity fundraiser Status Within Functional Limits for tasks assessed    Attention Focused    Focused Attention Appears intact    Memory Appears intact    Awareness Appears intact    Problem Solving Appears intact      Observation/Other Assessments   Focus on Therapeutic Outcomes (FOTO)  1x visit       Sensation   Light Touch Appears Intact    Additional Comments None       Coordination   Gross Motor Movements are Fluid and Coordinated Yes    Fine Motor Movements are Fluid and Coordinated Yes      ROM / Strength   AROM / PROM / Strength PROM;AROM;Strength      AROM   Overall AROM Comments full active ROM of shoulder and LE      Strength   Overall Strength Comments 5/5 gross UE and LE     Strength Assessment Site Hand    Right/Left hand Right;Left    Right Hand Grip (lbs) 60    Left Hand Grip (lbs) 50            [facility] Pre-Surgical Assessment - 07/18/19 0001    5 Meter Walk Test- trial 1 7 sec    5 Meter Walk Test- trial 2 7 sec.     5 Meter Walk Test- trial 3 6 sec.    5 meter walk test average 6.67 sec    4 Stage Balance Test tolerated for:  10 sec.    4 Stage Balance Test Position 4    Sit To Stand Test- trial 1 5 sec.    Comment 11    ADL/IADL Independent with: Bathing;Dressing;Meal prep;Finances    ADL/IADL Needs Assistance with: Valla Leaver work    6 Minute Walk- Baseline yes    BP (mmHg) 112/76    HR (bpm) 69    02 Sat (%RA) 96 %    Modified Borg Scale for Dyspnea 0- Nothing at all    Perceived Rate of Exertion (Borg) 6-    6 Minute Walk Post Test yes    BP  (mmHg) 123/66  HR (bpm) 75    02 Sat (%RA) 89 %    Modified Borg Scale for Dyspnea 1- Very mild shortness of breath    Perceived Rate of Exertion (Borg) 9- very light    Aerobic Endurance Distance Walked 938    Endurance additional comments No seated rest break 31% disavbility                                PT Education - 07/18/19 1037    Education Details purpose of procedure    Person(s) Educated Patient    Methods Explanation    Comprehension Verbalized understanding;Returned demonstration                      Plan - 07/18/19 1038    Clinical Impression Statement SEE BELOW    Examination-Activity Limitations Stairs;Locomotion Level    Examination-Participation Restrictions Community Activity    Stability/Clinical Decision Making Stable/Uncomplicated    Clinical Decision Making Low    Rehab Potential Good    PT Frequency One time visit    PT Treatment/Interventions Patient/family education    Consulted and Agree with Plan of Care Patient           Patient will benefit from skilled therapeutic intervention in order to improve the following deficits and impairments:     Visit Diagnosis: Other abnormalities of gait and mobility   Clinical Impression Statement: Pt is a 86 yo male presenting to OP PT for evaluation prior to possible TAVR surgery due to severe aortic stenosis. Pt reports onset of dyspnea with ambulation  approximately 4 months ago. Symptoms are limiting exercise. Patient exercised daily prior to onset of fatigue and dyspnea. Pt presents with normal ROM and strength, normal balance and is not at high fall risk 4 stage balance test, decreased walking speed and decreased aerobic endurance per 6 minute walk test. Pt ambulated 938 feet in 6 min . At time of rest, patient's HR was  75 bpm and O2 was 90 on room air. Pt reported 1/10 shortness of breath on modified scale for dyspnea. . Pt ambulated a total of 938 feet in 6 minute walk. B/P  increased significantly with 6 minute walk test. Based on the Short Physical Performance Battery, patient has a frailty rating of 11/12 with </= 5/12 considered frail.   Problem List Patient Active Problem List   Diagnosis Date Noted  . Fatigue due to treatment 07/05/2016  . Atypical angina ([place]) 07/05/2016  . Balance problem 05/02/2015  . Symptomatic PVCs 09/14/2014  . Bradycardia 09/06/2014  . Essential hypertension 03/21/2011  . Hyperlipidemia with target LDL less than 70 03/21/2011  . Diabetes mellitus type II, controlled ([place]) 03/21/2011  . BPH (benign prostatic hyperplasia) 03/21/2011  . Severe calcific aortic valve stenosis 08/31/2010  . CAD S/P percutaneous coronary angioplasty --> PCI RCA Promus DES 2.5 mm x 23 mm 07/31/2006    Carney Living PT DPT  07/18/2019, 10:40 AM  [facility] [address] [place], [place], [address] Phone: [phone]   Fax:  [phone]  Name: Victor [name] MRN: [id number] Date of Birth: May 28, 1931

## 2019-07-18 NOTE — Progress Notes (Signed)
HEART AND [place] SURGERY CONSULTATION REPORT  Primary Cardiologist is [name], MD PCP is Feldpausch, Chrissie Noa, MD  Chief Complaint  Patient presents with  . Aortic Stenosis    TAVR Consultation    HPI:  Patient is an 86 year old male with history of aortic stenosis, coronary artery disease status post PCI and stenting in the remote past, hypertension, and hyperlipidemia who has been referred for surgical consultation to discuss treatment options for management of severe symptomatic aortic stenosis.  Patient's cardiac history dates back to 2008 when he first was found to have coronary artery disease associated with abnormal stress test.  He was treated with PCI and stenting of the right coronary artery at that time and has done well.  He has been followed intermittently ever since by Dr. [name].  He has developed aortic stenosis which has gradually progressed in severity on follow-up echocardiographic imaging.  Echocardiogram performed March 06, 2019 revealed severe aortic stenosis with peak velocity across aortic valve measured 4.4 m/s corresponding to mean transvalvular gradient estimated 42 mmHg and aortic valve area estimated 0.79 cm by VTI.  The DVI was reported 0.23.  Left ventricular systolic function remain normal.  Repeat echocardiogram was performed Jun 12, 2019 and confirmed the presence of severe aortic stenosis with peak velocity across aortic valve measured greater than 4.5 m/s corresponding to mean transvalvular gradient estimated 52 mmHg and aortic valve area calculated only 0.49 cm by VTI.  Left ventricular systolic function remain normal.  Patient was seen in follow-up by [name] at [facility] on Jun 18, 2019 at which time he complained of progressive symptoms of exertional shortness of breath and decreased energy.  Diagnostic cardiac catheterization was performed Jun 25, 2019 and confirmed the  presence of severe aortic stenosis with peak to peak and mean transvalvular gradients measured 45 and 44 mmHg by catheterization, respectively.  Patient was noted to have continuous patency of stent in the right coronary artery with otherwise mild nonobstructive coronary artery disease.  Left ventricular systolic function remain normal and right heart pressures were normal.  The patient was referred to the multidisciplinary heart valve clinic and has been evaluated previously by Dr. [name].  CT angiography was performed and the patient was referred for surgical consultation.  Patient is married and lives with his wife in [place].  His wife is not in good health and recently had knee surgery.  He is accompanied by his daughter for his office consultation visit today.  The patient has been retired for many years.  He has been physically active all of his adult life and only recently started to slow down physically.  He complains that over the last 6 months he has developed progressive exertional shortness of breath and decreased energy.  He denies shortness of breath with low-level activity or ordinary activities around the house, but walking up a hill he will get short of breath fairly quickly.  He has not had chest pain or chest tightness either with activity or at rest.  He denies PND, orthopnea, or lower extremity edema.  He has not had dizzy spells or syncope.  Past Medical History:  Diagnosis Date  . Angina   . Aortic valve stenosis, moderate 08/2014   Progression to moderate-severe stenosis: Echo in June 2018 showed mean gradient 30 mmHg, peak gradient 57 mmHg.  . Arthritis   . Bilateral carotid artery disease ([place])    CAROTID DOPPLER,05/21/2008 - Right and left ICA-0-49%  diameter reduction, left CCA-0-49% diameter reductiion  . BPH (benign prostatic hypertrophy)   . CAD S/P percutaneous coronary angioplasty 07/2006   PCI to RCA - Promus DES 2.5 mm x 23 mm; 2D ECHO - EF >55%, moderate  calcification of the aortic valve leaflets  . Diabetes mellitus   . GERD (gastroesophageal reflux disease)   . High cholesterol   . Hypertension    "from the diabetes"    Past Surgical History:  Procedure Laterality Date  . CARDIAC CATHETERIZATION with PCI  08/27/2006   RCA-mid - 2.5x62m Promus stent  . CATARACT EXTRACTION W/PHACO Right 03/08/2015   Procedure: CATARACT EXTRACTION PHACO AND INTRAOCULAR LENS PLACEMENT (IOC);  Surgeon: SEstill Cotta MD;  Location: [facility] ORS;  Service: Ophthalmology;  Laterality: Right;  [place] 01:29.4   . COLECTOMY  ~ 2000  . LEFT HEART CATHETERIZATION WITH CORONARY ANGIOGRAM N/A 03/22/2011   Procedure: LEFT HEART CATHETERIZATION WITH CORONARY ANGIOGRAM;  Surgeon: DLeonie Man MD;  [facility];  Service: Cardiovascular::: Patent RCA stent w/ progression of pRCA Dz to ~50-60%. Progression of oD3 lesion to 60-70% - not optimal for PCI (b/c ostial).  EF 55-60% - no RWMA. Med Rx.  Aortic valve gradient: Peak 22 mmHg, mean 13 mmHg  . NM MYOVIEW LTD  08/2014   LOW RISK. NORMAL.  EF 45-54%.   .Marland KitchenRIGHT/LEFT HEART CATH AND CORONARY ANGIOGRAPHY N/A 06/25/2019   Procedure: RIGHT/LEFT HEART CATH AND CORONARY ANGIOGRAPHY;  Surgeon: HLeonie Man MD;  [facility];  Service: Cardiovascular;  Laterality: N/A;  . TRANSTHORACIC ECHOCARDIOGRAM  04/2017   Normal LV size and function.  EF 60-65%.  GR 1 DD.  Moderate aortic stenosis with estimated valve area between 0.88-0.97 cm.  (Mean gradient 25 mmHg, peak gradient 43 mmHg)  . TRANSTHORACIC ECHOCARDIOGRAM  08/26/2015   Normal LV size with mild concentric hypertrophy. Normal EF 55-60%. No RWMA. GR 1 DD. Severe aortic stenosis (mean gradient 24 mmHg, AVA 0.75 cm) --> progression from 2016. Mild-moderate MR. Mildly dilated RV. Mildly elevated PA pressures (40 mmHg.  .Marland KitchenTRANSTHORACIC ECHOCARDIOGRAM  06/2016   EF 60-65%. GR 1 DD. Moderate to severe aortic stenosis with a mean gradient 30 mmHg, peak  gradient of 57 mmHg.   .Marland KitchenTRANSURETHRAL RESECTION OF PROSTATE  ~ 2010    Family History  Problem Relation Age of Onset  . ALS Mother   . Prostate cancer Father     Social History   Socioeconomic History  . Marital status: Married    Spouse name: Not on file  . Number of children: 2  . Years of education: Not on file  . Highest education level: Not on file  Occupational History  . Occupation: RProofreader Tobacco Use  . Smoking status: Never Smoker  . Smokeless tobacco: Former USystems developer   Types: Chew  Substance and Sexual Activity  . Alcohol use: Yes    Alcohol/week: 6.0 standard drinks    Types: 6 Glasses of wine per week  . Drug use: No  . Sexual activity: Yes  Other Topics Concern  . Not on file  Social History Narrative   Father of 2, grandfather 575   Exercise for almost 2 hours a day, doing least 20 minutes on the elliptical trainer. He does his to 4 days a week. He'll also does weights and stretching exercises.   Social Determinants of Health   Financial Resource Strain:   . Difficulty of Paying Living Expenses:  Food Insecurity:   . Worried About Charity fundraiser in the Last Year:   . Arboriculturist in the Last Year:   Transportation Needs:   . Film/video editor (Medical):   Marland Kitchen Lack of Transportation (Non-Medical):   Physical Activity:   . Days of Exercise per Week:   . Minutes of Exercise per Session:   Stress:   . Feeling of Stress :   Social Connections:   . Frequency of Communication with Friends and Family:   . Frequency of Social Gatherings with Friends and Family:   . Attends Religious Services:   . Active Member of Clubs or Organizations:   . Attends Archivist Meetings:   Marland Kitchen Marital Status:   Intimate Partner Violence:   . Fear of Current or Ex-Partner:   . Emotionally Abused:   Marland Kitchen Physically Abused:   . Sexually Abused:     Current Outpatient Medications  Medication Sig Dispense Refill  . acetaminophen  (TYLENOL) 500 MG tablet Take 500 mg by mouth every 6 (six) hours as needed for moderate pain or headache.    . Artificial Tear Solution (GENTEAL TEARS OP) Place 1 drop into both eyes daily.    Marland Kitchen ascorbic acid (VITAMIN C) 500 MG tablet Take 1,000-1,500 mg by mouth See admin instructions. Take 1000 mg in the morning and 1500 mg at night    . aspirin EC 81 MG tablet Take 81 mg by mouth daily.    . benzonatate (TESSALON) 200 MG capsule Take 200 mg by mouth 3 (three) times daily as needed for cough.    . bismuth subsalicylate (PEPTO BISMOL) 262 MG/15ML suspension Take 30 mLs by mouth every 6 (six) hours as needed for indigestion or diarrhea or loose stools.    . calcium carbonate (OS-CAL) 600 MG TABS tablet Take 600 mg by mouth daily.    . cetirizine (ZYRTEC) 10 MG tablet Take 10 mg by mouth daily.    . Cholecalciferol (VITAMIN D3) 2000 UNITS TABS Take 4,000 Units by mouth 2 (two) times daily.     Marland Kitchen CINNAMON PO Take 1,000 mg by mouth 2 (two) times daily.    . Coenzyme Q10 (COQ-10) 100 MG CAPS Take 100 mg by mouth daily.    . Cyanocobalamin (B-12) 2500 MCG TABS Take 2,500 mcg by mouth daily.    Marland Kitchen donepezil (ARICEPT) 10 MG tablet Take 10 mg by mouth daily.    Marland Kitchen EPIPEN 2-PAK 0.3 MG/0.3ML SOAJ injection Inject 0.3 mLs as directed as needed for anaphylaxis.     Marland Kitchen ezetimibe (ZETIA) 10 MG tablet Take 10 mg by mouth daily.    . fluticasone (FLONASE) 50 MCG/ACT nasal spray Place 2 sprays into both nostrils 2 (two) times daily as needed for allergies.     Marland Kitchen gabapentin (NEURONTIN) 100 MG capsule Take 300 mg by mouth at bedtime.     Marland Kitchen glipiZIDE (GLUCOTROL XL) 2.5 MG 24 hr tablet Take 2.5 mg by mouth daily with breakfast.    . Glucosamine HCl 1000 MG TABS Take 1,000 mg by mouth 2 (two) times daily.    Marland Kitchen ipratropium (ATROVENT) 0.03 % nasal spray Place 2 sprays into both nostrils 2 (two) times daily as needed for rhinitis.    Marland Kitchen loperamide (IMODIUM A-D) 2 MG tablet Take 2 mg by mouth daily as needed for diarrhea or  loose stools.    . methocarbamol (ROBAXIN) 500 MG tablet Take 500 mg by mouth at bedtime as needed for muscle  spasms.    . metoprolol succinate (TOPROL-XL) 25 MG 24 hr tablet Take 1 tablet (25 mg total) by mouth daily. 90 tablet 3  . Multiple Vitamin (MULITIVITAMIN WITH MINERALS) TABS Take 1 tablet by mouth 2 (two) times daily.     . Omega 3 1200 MG CAPS Take 2,400 mg by mouth 2 (two) times daily.     . Probiotic CAPS Take 1 capsule by mouth daily.    . pseudoephedrine (SUDAFED) 30 MG tablet Take 60 mg by mouth every 4 (four) hours as needed for congestion.    . Pumpkin Seed-Soy Germ (AZO BLADDER CONTROL/GO-LESS PO) Take 1 tablet by mouth daily.    . Saw Palmetto 450 MG CAPS Take 900 mg by mouth daily.     . simvastatin (ZOCOR) 40 MG tablet Take 40 mg by mouth at bedtime.    . triamcinolone cream (KENALOG) 0.1 % Apply 1 application topically 3 (three) times daily as needed for itching.    . zinc gluconate 50 MG tablet Take 50 mg by mouth daily.     No current facility-administered medications for this visit.    Allergies  Allergen Reactions  . Azithromycin Rash and Anaphylaxis  . Cephalosporins Anaphylaxis and Other (See Comments)  . Codeine Anaphylaxis, Itching and Rash  . Doxycycline Anaphylaxis  . Phenylephrine-Guaifenesin     Unknown reaction   . Pseudoephedrine Other (See Comments)    unknown   . Amoxicillin Rash  . Levofloxacin Rash  . Penicillins Rash    Other reaction(s): Unknown  . Sulfa Antibiotics Rash  . Sulfasalazine Rash      Review of Systems:   General:  normal appetite, decreased energy, no weight gain, no weight loss, no fever  Cardiac:  no chest pain with exertion, no chest pain at rest, +SOB with exertion, no resting SOB, no PND, no orthopnea, no palpitations, no arrhythmia, no atrial fibrillation, no LE edema, no dizzy spells, no syncope  Respiratory:  + exertional shortness of breath, no home oxygen, no productive cough, no dry cough, no bronchitis, no  wheezing, no hemoptysis, no asthma, no pain with inspiration or cough, no sleep apnea, no CPAP at night  GI:   no difficulty swallowing, + reflux, + frequent heartburn, no hiatal hernia, no abdominal pain, no constipation, no diarrhea, no hematochezia, no hematemesis, no melena  GU:   no dysuria,  no frequency, no urinary tract infection, no hematuria, no enlarged prostate, no kidney stones, no kidney disease  Vascular:  no pain suggestive of claudication, no pain in feet, + leg cramps, no varicose veins, no DVT, no non-healing foot ulcer  Neuro:   no stroke, no TIA's, no seizures, no headaches, no temporary blindness one eye,  no slurred speech, no peripheral neuropathy, no chronic pain, no instability of gait, mild short term memory/cognitive dysfunction  Musculoskeletal: + arthritis, no joint swelling, no myalgias, no difficulty walking, normal mobility   Skin:   no rash, no itching, no skin infections, no pressure sores or ulcerations  Psych:   no anxiety, no depression, no nervousness, no unusual recent stress  Eyes:   no blurry vision, no floaters, no recent vision changes, does not wear glasses or contacts  ENT:   + hearing loss, no loose or painful teeth, no dentures, last saw dentist 6 months ago  Hematologic:  no easy bruising, no abnormal bleeding, no clotting disorder, no frequent epistaxis  Endocrine:  + diabetes, does check CBG's at home  Physical Exam:   BP 126/76 (BP Location: Right Arm, Patient Position: Sitting, Cuff Size: Normal)   Pulse 65   Temp (!) 96.6 F (35.9 C) (Temporal)   Resp 20   Ht _0  (1.727 m)   Wt 173 lb (78.5 kg)   SpO2 95% Comment: RA  BMI 26.30 kg/m   General:    well-appearing  HEENT:  Unremarkable   Neck:   no JVD, no bruits, no adenopathy   Chest:   clear to auscultation, symmetrical breath sounds, no wheezes, no rhonchi   CV:   RRR, grade IV/VI crescendo/decrescendo murmur heard best at LLSB,  no diastolic murmur  Abdomen:  soft,  non-tender, no masses   Extremities:  warm, well-perfused, pulses palpable, no LE edema  Rectal/GU  Deferred  Neuro:   Grossly non-focal and symmetrical throughout  Skin:   Clean and dry, no rashes, no breakdown   Diagnostic Tests:  EKG: NSR w/out significant AV conduction delay    ECHOCARDIOGRAM REPORT       Patient Name:  EMERSON SCHREIFELS Date of Exam: 06/12/2019  Medical Rec #: [id number]   Height:    68.0 in  Accession #:  [id number]   Weight:    172.8 lb  Date of Birth: July 21, 1931   BSA:     1.921 m  Patient Age:  52 years    BP:      129/76 mmHg  Patient Gender: M       HR:      63 bpm.  Exam Location: Outpatient   Procedure: 2D Echo, Color Doppler and Cardiac Doppler   Indications:  Aortic Stenosis i35.0    History:    Patient has prior history of Echocardiogram examinations,  most         recent 03/07/2019. CAD; Risk Factors:Hypertension, Diabetes  and         Dyslipidemia.    Sonographer:  Raquel Sarna Senior RDCS  Referring Phys: [place]    1. Since prior ECHO, aortic stenosis has increased to severe.  2. Left ventricular ejection fraction, by estimation, is 60 to 65%. The  left ventricle has normal function. The left ventricle has no regional  wall motion abnormalities. There is mild asymmetric left ventricular  hypertrophy of the basal-septal segment.  Left ventricular diastolic parameters are consistent with Grade I  diastolic dysfunction (impaired relaxation). Elevated left atrial  pressure.  3. Right ventricular systolic function is normal. The right ventricular  size is normal. There is normal pulmonary artery systolic pressure. The  estimated right ventricular systolic pressure is 86.5 mmHg.  4. Left atrial size was moderately dilated.  5. The mitral valve is normal in structure. Moderate mitral valve  regurgitation. No evidence of mitral stenosis.    6. Tricuspid valve regurgitation is moderate.  7. The aortic valve is tricuspid. Aortic valve regurgitation is not  visualized. Severe aortic valve stenosis. Aortic valve area, by VTI  measures 0.49 cm. Aortic valve mean gradient measures 52.5 mmHg. Aortic  valve Vmax measures 4.56 m/s.  8. The inferior vena cava is normal in size with greater than 50%  respiratory variability, suggesting right atrial pressure of 3 mmHg.   FINDINGS  Left Ventricle: Left ventricular ejection fraction, by estimation, is 60  to 65%. The left ventricle has normal function. The left ventricle has no  regional wall motion abnormalities. The left ventricular internal cavity  size was normal in size. There is  mild asymmetric left  ventricular hypertrophy of the basal-septal segment.  Left ventricular diastolic parameters are consistent with Grade I  diastolic dysfunction (impaired relaxation). Elevated left atrial  pressure.   Right Ventricle: The right ventricular size is normal. No increase in  right ventricular wall thickness. Right ventricular systolic function is  normal. There is normal pulmonary artery systolic pressure. The tricuspid  regurgitant velocity is 2.59 m/s, and  with an assumed right atrial pressure of 3 mmHg, the estimated right  ventricular systolic pressure is 16.1 mmHg.   Left Atrium: Left atrial size was moderately dilated.   Right Atrium: Right atrial size was normal in size.   Pericardium: There is no evidence of pericardial effusion.   Mitral Valve: The mitral valve is normal in structure. Normal mobility of  the mitral valve leaflets. Moderate mitral valve regurgitation. No  evidence of mitral valve stenosis.   Tricuspid Valve: The tricuspid valve is normal in structure. Tricuspid  valve regurgitation is moderate . No evidence of tricuspid stenosis.   Aortic Valve: The aortic valve is tricuspid. . There is severe thickening  and severe calcifcation of the aortic  valve. Aortic valve regurgitation is  not visualized. Severe aortic stenosis is present. There is severe  thickening of the aortic valve. There  is severe calcifcation of the aortic valve. Aortic valve mean gradient  measures 52.5 mmHg. Aortic valve peak gradient measures 83.0 mmHg. Aortic  valve area, by VTI measures 0.49 cm.   Pulmonic Valve: The pulmonic valve was normal in structure. Pulmonic valve  regurgitation is trivial. No evidence of pulmonic stenosis.   Aorta: The aortic root is normal in size and structure.   Venous: The inferior vena cava is normal in size with greater than 50%  respiratory variability, suggesting right atrial pressure of 3 mmHg.   IAS/Shunts: No atrial level shunt detected by color flow Doppler.   Additional Comments: Since prior ECHO, aortic stenosis has increased to  severe.     LEFT VENTRICLE  PLAX 2D  LVIDd:     4.40 cm Diastology  LVIDs:     2.60 cm LV e' lateral:  5.98 cm/s  LV PW:     1.00 cm LV E/e' lateral: 15.2  LV IVS:    1.30 cm LV e' medial:  4.90 cm/s  LVOT diam:   2.10 cm LV E/e' medial: 18.5  LV SV:     56  LV SV Index:  29  LVOT Area:   3.46 cm     RIGHT VENTRICLE  RV S prime:   7.94 cm/s  TAPSE (M-mode): 2.4 cm   LEFT ATRIUM       Index    RIGHT ATRIUM      Index  LA diam:    3.80 cm 1.98 cm/m RA Area:   17.80 cm  LA Vol (A2C):  80.1 ml 41.70 ml/m RA Volume:  44.50 ml 23.17 ml/m  LA Vol (A4C):  52.0 ml 27.07 ml/m  LA Biplane Vol: 70.8 ml 36.86 ml/m  AORTIC VALVE  AV Area (Vmax):  0.51 cm  AV Area (Vmean):  0.50 cm  AV Area (VTI):   0.49 cm  AV Vmax:      455.50 cm/s  AV Vmean:     342.000 cm/s  AV VTI:      1.135 m  AV Peak Grad:   83.0 mmHg  AV Mean Grad:   52.5 mmHg  LVOT Vmax:     66.60 cm/s  LVOT Vmean:    49.100  cm/s  LVOT VTI:     0.161 m  LVOT/AV VTI ratio: 0.14    AORTA  Ao Root diam: 3.20  cm  Ao Asc diam: 3.30 cm   MITRAL VALVE        TRICUSPID VALVE  MV Area (PHT): 3.27 cm   TR Peak grad:  26.8 mmHg  MV Decel Time: 232 msec   TR Vmax:    259.00 cm/s  MV E velocity: 90.80 cm/s  MV A velocity: 127.00 cm/s SHUNTS  MV E/A ratio: 0.71     Systemic VTI: 0.16 m               Systemic Diam: 2.10 cm   Candee Furbish MD  Electronically signed by Candee Furbish MD  Signature Date/Time: 06/12/2019/1:52:11 PM       RIGHT/LEFT HEART CATH AND CORONARY ANGIOGRAPHY  Conclusion    LV end diastolic pressure is normal.  Right heart cath pressures are normal  There is severe aortic valve stenosis. Mean gradient 44.2 mmHg  Prox RCA lesion is 60% stenosed. Stable from prior cath  Previously placed Prox RCA to Mid RCA stent (unknown type) is widely patent.  2nd Diag lesion is 70% stenosed. 3rd Diag lesion is 50% stenosed. -Both lesions are not favorable for PCI. Also stable.   SUMMARY  Confirmation of severe aortic stenosis with mean gradient 44.2 mmHg.  Peak to peak 45 mmHg. (By echo was 52 mmHg)  Stable coronary arteries with 50 to 60% proximal RCA followed by brief ectatic segment and then widely patent mid stent.  Otherwise small caliber 1st Diag ~70% ostial stenosis.   Normal right heart cath numbers.   Will refer to valve clinic for TAVR evaluation.    [name], MD   Recommendations  Antiplatelet/Anticoag Recommend Aspirin 3m daily for moderate CAD.  Discharge Date In the absence of any other complications or medical issues, we expect the patient to be ready for discharge from a cath perspective on 06/25/2019. Patient will need to be scheduled to follow-up in valve clinic prior to discharge.  Indications  Severe calcific aortic stenosis [I35.0 (ICD-10-CM)]  CAD S/P percutaneous coronary angioplasty [I25.10, Z98.61 (ICD-10-CM)]  Procedural Details  Technical Details PCP:  FSofie Hartigan MD             Cardiologist: Dr. HEllyn Hack KJory Sims DNP  Keahi L WClydene Laming("Cotton") is a 86y.o. male with history of CAD having PCI to RCA in 2008 (most recent cath in 2013 showed patent RCA stent with proximal RCA 50-60% and ostial small caliber D2 and D3-not PCI targets) who has been followed for progression of his aortic stenosis.  As of March 2021 mean gradient it progressed to roughly 44 mmHg, with no symptoms.  Unfortunately, he was just seen by KJory Sims NP earlier this month with significant progression of exertional dyspnea.  Repeat echocardiogram 06/12/2019 revealed progression of severe aortic stenosis --> aortic valve area, by VTI measuring 0.49 cm.  Aortic valve mean gradient measures 52.5 mmHg.  Aortic valve V-max measured 4.56 m/s.  He was seen by KJory Simsas a result of his echocardiogram with progression of disease to discuss right and left heart catheterization.  Time Out: Verified patient identification, verified procedure, site/side was marked, verified correct patient position, special equipment/implants available, medications/allergies/relevent history reviewed, required imaging and test results available. Performed.  Access:  Modified Seldinger technique using Micropuncture Kit used for both venous and arterial access, Direct ultrasound guidance used.  Permanent image obtained  and placed on chart  *  RIGHT Radial Artery: 6 Fr sheath --.10 mL radial cocktail IA; 4000 units IV Heparin * RIGHT Brachial Vein: 5 Fr sheath  Right Heart Catheterization: 5 Fr Swan Ganz catheter advanced under fluoroscopy with balloon inflated to the RA, RV, then PCWP-PA for hemodynamic measurement.  * Simultaneous FA & PA blood gases checked for SaO2% to calculate FICK CO/CI  * Catheter removed completely out of the body with balloon deflated.  Left Heart Catheterization: 5 Fr Catheters advanced or exchanged over a J-wire under direct fluoroscopic guidance into the ascending aorta; TIG 4.0  catheter advanced first.  * LV Hemodynamics (LV Gram): Aortic valve crossed using AL-1 catheter and straight wire, exchanged over J-wire for TIG 4.0 catheter.  No LV gram * Left & Right Coronary Artery Cineangiography: TIG 4.0 catheter   Upon completion of Angiogaphy, the catheter was removed completely out of the body over a wire, without complication.  Brachial Sheath(s) removed in the Cath Lab with manual pressure for hemostasis.    Radial sheath removed in the Cardiac Catheterization lab with TR Band placed for hemostasis.  TR Band: 1130 Hours; 14 mL air  MEDICATIONS * SQ Lidocaine 69m * Radial Cocktail: 3 mg Verapmil in 10 mL NS * Isovue Contrast: 40 mL * Heparin: 4000 units Estimated blood loss <50 mL.   During this procedure medications were administered to achieve and maintain moderate conscious sedation while the patient's heart rate, blood pressure, and oxygen saturation were continuously monitored and I was present face-to-face 100% of this time.  Medications (Filter: Administrations occurring from [phone] [phone] on 06/25/19) Heparin (Porcine) in NaCl 1000-0.9 UT/500ML-% SOLN (mL) Total volume:  1,000 mL Date/Time  Rate/Dose/Volume Action  06/25/19 1003  500 mL Given  1003  500 mL Given    midazolam (VERSED) injection (mg) Total dose:  0.5 mg Date/Time  Rate/Dose/Volume Action  06/25/19 1009  0.5 mg Given    lidocaine (PF) (XYLOCAINE) 1 % injection (mL) Total volume:  4 mL Date/Time  Rate/Dose/Volume Action  06/25/19 1039  2 mL Given  1043  2 mL Given    Radial Cocktail/Verapamil only (mL) Total volume:  10 mL Date/Time  Rate/Dose/Volume Action  06/25/19 1045  10 mL Given    heparin sodium (porcine) injection (Units) Total dose:  4,000 Units Date/Time  Rate/Dose/Volume Action  06/25/19 1058  4,000 Units Given    iohexol (OMNIPAQUE) 350 MG/ML injection (mL) Total volume:  40 mL Date/Time  Rate/Dose/Volume Action  06/25/19 1123  40 mL Given    Sedation  Time  Sedation Time Physician-1: 1 hour 11 minutes 59 seconds  Contrast  Medication Name Total Dose  iohexol (OMNIPAQUE) 350 MG/ML injection 40 mL    Radiation/Fluoro  Fluoro time: 11.6 (min) DAP: 13.6 (Gycm2) Cumulative Air Kerma: 2201.0(mGy)  Complications  Complications documented before study signed (06/25/2019 107:12PM)   No complications were associated with this study.  Documented by HLeonie Man MD - 06/25/2019 11:40 AM    Coronary Findings  Diagnostic Dominance: Right Left Main  Vessel was injected. Vessel is large. Vessel is angiographically normal.  Left Anterior Descending  Vessel is angiographically normal. The vessel is tortuous.  First Diagonal Branch  Vessel is large in size. Vessel is angiographically normal.  Second Diagonal Branch  Vessel is small in size.  2nd Diag lesion is 70% stenosed. The lesion is focal, discrete and concentric.  Third Diagonal Branch  Vessel is moderate in size.  3rd  Diag lesion is 50% stenosed. The lesion is focal, discrete and concentric.  Left Circumflex  Vessel is large.  First Obtuse Marginal Branch  Vessel is moderate in size. Vessel is angiographically normal.  Second Obtuse Marginal Branch  Vessel is large in size. Vessel is angiographically normal. The vessel is tortuous.  First Left Posterolateral Branch  Vessel is moderate in size.  Right Coronary Artery  The vessel is mildly tortuous. The vessel is mildly ectatic. Focal ectasia after focal proximal stenosis  Prox RCA lesion is 60% stenosed. The lesion is located at the bend, focal and discrete. Followed by Po stenotic ectasia  Previously placed Prox RCA to Mid RCA stent (unknown type) is widely patent.  Right Ventricular Branch  Vessel is small in size.  Right Posterior Atrioventricular Artery  Vessel is small in size.  Intervention  No interventions have been documented. Right Heart  Right Heart Pressures PAP-mean: 22/3 mmHg - 12 mmHg PCWP 4 mmHg LV  EDP is normal. LV P-EDP: 152/2 mmHg - 10 mmHg AoP-MAP: 107/53 mmHg - 75 mmHg Ao sat 99%, PA sat 74%. CARDIAC OUTPUT,INDEX (Fick): 5.48, 2.88  Right Atrium Right atrial pressure is normal. RAP 2 mmHg  Right Ventricle RVP-EDP: 21/1 mmHg - 3 mmHg  Wall Motion  Resting    No LV gram performed. By ECHO EF 60-65% with no R WMA.        Left Heart  Left Ventricle LV end diastolic pressure is normal.  Aortic Valve There is severe aortic valve stenosis. The aortic valve is calcified. There is restricted aortic valve motion. By ECHO mean gradient 52.5 mmHg By cath mean gradient 44.2 mmHg, P-P 45 million mercury.  Coronary Diagrams  Diagnostic Dominance: Right  Intervention  Implants   No implant documentation for this case.  Syngo Images  Show images for CARDIAC CATHETERIZATION Images on Long Term Storage  Show images for Clydene Laming, Jaylyn L Link to Procedure Log  Procedure Log    Hemo Data   Most Recent Value  Fick Cardiac Output 5.48 L/min  Fick Cardiac Output Index 2.88 (L/min)/BSA  Aortic Mean Gradient 44.24 mmHg  Aortic Peak Gradient 45 mmHg  Aortic Valve Area 0.92  Aortic Value Area Index 0.48 cm2/BSA  RA A Wave 3 mmHg  RA V Wave 1 mmHg  RA Mean 1 mmHg  RV Systolic Pressure 21 mmHg  RV Diastolic Pressure 0 mmHg  RV EDP 3 mmHg  PA Systolic Pressure 22 mmHg  PA Diastolic Pressure 3 mmHg  PA Mean 12 mmHg  PW A Wave 5 mmHg  PW V Wave 4 mmHg  PW Mean 4 mmHg  AO Systolic Pressure 884 mmHg  AO Diastolic Pressure 59 mmHg  AO Mean 79 mmHg  LV Systolic Pressure 166 mmHg  LV Diastolic Pressure 2 mmHg  LV EDP 10 mmHg  AOp Systolic Pressure 063 mmHg  AOp Diastolic Pressure 53 mmHg  AOp Mean Pressure 75 mmHg  LVp Systolic Pressure 016 mmHg  LVp Diastolic Pressure 2 mmHg  LVp EDP Pressure 9 mmHg  QP/QS 1  TPVR Index 4.17 HRUI  TSVR Index 27.49 HRUI  PVR SVR Ratio 0.1  TPVR/TSVR Ratio 0.15      CT ANGIOGRAPHY CHEST, ABDOMEN AND PELVIS  TECHNIQUE: Non-contrast  CT of the chest was initially obtained.  Multidetector CT imaging through the chest, abdomen and pelvis was performed using the standard protocol during bolus administration of intravenous contrast. Multiplanar reconstructed images and MIPs were obtained and reviewed to evaluate the vascular anatomy.  CONTRAST:  12m OMNIPAQUE IOHEXOL 350 MG/ML SOLN  COMPARISON:  None.  FINDINGS: CTA CHEST FINDINGS  Cardiovascular: Heart size is normal. There is no significant pericardial fluid, thickening or pericardial calcification. There is aortic atherosclerosis, as well as atherosclerosis of the great vessels of the mediastinum and the coronary arteries, including calcified atherosclerotic plaque in the left main, left anterior descending, left circumflex and right coronary arteries. Severe thickening and calcification of the aortic valve. Severe calcifications of the mitral annulus.  Mediastinum/Lymph Nodes: No pathologically enlarged mediastinal or hilar lymph nodes. Esophagus is unremarkable in appearance. No axillary lymphadenopathy.  Lungs/Pleura: No suspicious appearing pulmonary nodules or masses are noted. No acute consolidative airspace disease. No pleural effusions.  Musculoskeletal/Soft Tissues: There are no aggressive appearing lytic or blastic lesions noted in the visualized portions of the skeleton.  CTA ABDOMEN AND PELVIS FINDINGS  Hepatobiliary: 1 cm low-attenuation lesion in segment 4A of the liver, compatible with a simple cyst. No other suspicious hepatic lesions. No intra or extrahepatic biliary ductal dilatation. Tiny calcified gallstones lying dependently in the gallbladder. No findings to suggest an acute cholecystitis at this time.  Pancreas: No pancreatic mass. No pancreatic ductal dilatation. No pancreatic or peripancreatic fluid collections or inflammatory changes.  Spleen: Unremarkable.  Adrenals/Urinary Tract: 8.4 cm low-attenuation  lesion in the interpolar region of the right kidney, compatible with a large simple cyst. 2 cm simple cyst in the interpolar region of the left kidney. Subcentimeter low-attenuation lesion in the upper pole of the right kidney, too small to characterize, but statistically likely to represent a tiny cyst. 4 mm nonobstructive calculus in the lower pole collecting system of the right kidney. Bilateral adrenal glands are normal in appearance. No hydroureteronephrosis. Urinary bladder is unremarkable in appearance.  Stomach/Bowel: Normal appearance of the stomach is normal. No pathologic dilatation of small bowel or colon. Postoperative changes of partial colectomy. Numerous colonic diverticulae are noted, without surrounding inflammatory changes to suggest an acute diverticulitis at this time. Normal appendix.  Vascular/Lymphatic: Aortic atherosclerosis, without evidence of aneurysm or dissection in the abdominal or pelvic vasculature. No lymphadenopathy noted in the abdomen or pelvis.  Reproductive: Postoperative changes of TURP are noted in the prostate gland. Seminal vesicles are unremarkable in appearance.  Other: No significant volume of ascites.  No pneumoperitoneum.  Musculoskeletal: There are no aggressive appearing lytic or blastic lesions noted in the visualized portions of the skeleton.  VASCULAR MEASUREMENTS PERTINENT TO TAVR:  AORTA:  Minimal Aortic Diameter-17 x 14 mm  Severity of Aortic Calcification-severe  RIGHT PELVIS:  Right Common Iliac Artery -  Minimal Diameter-11.3 x 11.0 mm  Tortuosity-mild-to-moderate  Calcification-moderate  Right External Iliac Artery -  Minimal Diameter-9.4 x 9.4 mm  Tortuosity-moderate  Calcification-mild  Right Common Femoral Artery -  Minimal Diameter-9.1 x 9.4 mm  Tortuosity-mild  Calcification-mild  LEFT PELVIS:  Left Common Iliac Artery -  Minimal Diameter-11.7 x 10.5  mm  Tortuosity-mild-to-moderate  Calcification-moderate  Left External Iliac Artery -  Minimal Diameter-9.2 x 9.1 mm  Tortuosity-moderate to severe  Calcification-mild  Left Common Femoral Artery -  Minimal Diameter-8.9 x 8.5 mm  Tortuosity-mild  Calcification-minimal  Review of the MIP images confirms the above findings.  IMPRESSION: 1. Vascular findings and measurements pertinent to potential TAVR procedure, as detailed above. 2. Severe thickening calcification of the aortic valve, compatible with the reported clinical history of severe aortic stenosis. 3. There is also severe thickening calcification of the mitral annulus. 4. Aortic atherosclerosis, in addition to left main  and 3 vessel coronary artery disease. 5. Colonic diverticulosis without evidence of acute diverticulitis at this time. 6. Additional incidental findings, as above.   Electronically Signed   By: Vinnie Langton M.D.   On: 07/16/2019 16:01   Impression:  Patient has stage D1 severe symptomatic aortic stenosis.  He describes a 71-monthhistory of progressive symptoms of exertional shortness of breath and fatigue consistent with chronic diastolic congestive heart failure, New York Heart Association functional class II.  I have personally reviewed the patient's recent transthoracic echocardiogram, diagnostic cardiac catheterization, and CT angiograms.  Echocardiogram reveals severe aortic stenosis with preserved left ventricular function.  The aortic valve is trileaflet with severe thickening, calcification, and restricted leaflet mobility involving all 3 leaflets across aortic valve.  Peak velocity across aortic valve measured greater than 4.5 m/s corresponding to mean transvalvular gradient greater than 50 mmHg.  Diagnostic cardiac catheterization confirmed the presence of severe aortic stenosis and revealed continued patency in the stent in the right coronary artery.  There is otherwise  mild nonobstructive coronary artery disease.  Right heart pressures were normal.    I agree the patient should undergo aortic valve replacement.  Risks associated with conventional surgery will be slightly elevated because of the patient's advanced age and under the circumstances he might better be treated with transcatheter aortic valve replacement.  CT angiography of the abdomen pelvis reveals what appears to be adequate pelvic vascular access to perform transcatheter aortic valve replacement via percutaneous transfemoral approach.  Unfortunately, cardiac gated CT angiogram of the heart was associated with severe motion artifact and does not appear to be satisfactory quality for preprocedural planning.   Plan:  The patient and his daughter were counseled at length regarding treatment alternatives for management of severe symptomatic aortic stenosis. Alternative approaches such as conventional aortic valve replacement, transcatheter aortic valve replacement, and continued medical therapy without intervention were compared and contrasted at length.  The risks associated with conventional surgical aortic valve replacement were discussed in detail, as were expectations for post-operative convalescence, and why I would favor transcatheter aortic valve replacement over conventional surgery.  Issues specific to transcatheter aortic valve replacement were discussed including questions about long term valve durability, the potential for paravalvular leak, possible increased risk of need for permanent pacemaker placement, and other technical complications related to the procedure itself.  Long-term prognosis with medical therapy was discussed. This discussion was placed in the context of the patient's own specific clinical presentation and past medical history.  All of their questions have been addressed.  The patient is eager to proceed with transcatheter aortic valve replacement as long it is felt to be technically  feasible.  However, his procedure will be postponed to facilitate repeat gated CT angiogram of the heart.  We tentatively plan to proceed with surgery on July 29, 2019.  Following the decision to proceed with transcatheter aortic valve replacement, a discussion has been held regarding what types of management strategies would be attempted intraoperatively in the event of life-threatening complications, including whether or not the patient would be considered a candidate for the use of cardiopulmonary bypass and/or conversion to open sternotomy for attempted surgical intervention.  The patient specifically requests that should a potentially life-threatening complication develop we would attempt emergency median sternotomy and/or other aggressive surgical procedures if the patient were to develop intraoperative complications that were potentially correctable with surgical intervention.  The patient has been advised of a variety of complications that might develop including but not limited to  risks of death, stroke, paravalvular leak, aortic dissection or other major vascular complications, aortic annulus rupture, device embolization, cardiac rupture or perforation, mitral regurgitation, acute myocardial infarction, arrhythmia, heart block or bradycardia requiring permanent pacemaker placement, congestive heart failure, respiratory failure, renal failure, pneumonia, infection, other late complications related to structural valve deterioration or migration, or other complications that might ultimately cause a temporary or permanent loss of functional independence or other long term morbidity.  The patient provides full informed consent for the procedure as described and all questions were answered.     I spent in excess of 90 minutes during the conduct of this office consultation and >50% of this time involved direct face-to-face encounter with the patient for counseling and/or coordination of their  care.     Valentina Gu. Roxy Manns, MD 07/18/2019 12:49 PM

## 2019-07-21 ENCOUNTER — Ambulatory Visit (HOSPITAL_COMMUNITY)
Admission: RE | Admit: 2019-07-21 | Discharge: 2019-07-21 | Disposition: A | Discharge status: Home / Self Care | Payer: Medicare Other | Source: Ambulatory Visit | Attending: Physician Assistant | Admitting: Physician Assistant

## 2019-07-21 ENCOUNTER — Other Ambulatory Visit: Payer: Self-pay

## 2019-07-21 DIAGNOSIS — I350 Nonrheumatic aortic (valve) stenosis: Secondary | ICD-10-CM

## 2019-07-21 MED ORDER — IOHEXOL 350 MG/ML SOLN
80.0000 mL | Freq: Once | INTRAVENOUS | Status: AC | PRN
  Administered 2019-07-21: 80 mL via INTRAVENOUS

## 2019-07-21 NOTE — Progress Notes (Addendum)
CT scan completed. Tolerated well. D/C home by wheelchair. Awake and alert. In no distress. Dr. Meda Coffee updated

## 2019-07-25 ENCOUNTER — Other Ambulatory Visit (HOSPITAL_COMMUNITY)
Admission: RE | Admit: 2019-07-25 | Discharge: 2019-07-25 | Disposition: A | Discharge status: Home / Self Care | Payer: Medicare Other | Source: Ambulatory Visit | Attending: Cardiovascular Disease | Admitting: Cardiovascular Disease

## 2019-07-25 ENCOUNTER — Ambulatory Visit (HOSPITAL_COMMUNITY)
Admission: RE | Admit: 2019-07-25 | Discharge: 2019-07-25 | Disposition: A | Discharge status: Home / Self Care | Payer: Medicare Other | Source: Ambulatory Visit | Attending: Cardiovascular Disease | Admitting: Cardiovascular Disease

## 2019-07-25 ENCOUNTER — Encounter (HOSPITAL_COMMUNITY)
Admission: RE | Admit: 2019-07-25 | Discharge: 2019-07-25 | Disposition: A | Discharge status: Home / Self Care | Payer: Medicare Other | Source: Ambulatory Visit | Attending: Cardiovascular Disease | Admitting: Cardiovascular Disease

## 2019-07-25 ENCOUNTER — Other Ambulatory Visit: Payer: Self-pay

## 2019-07-25 ENCOUNTER — Other Ambulatory Visit (HOSPITAL_COMMUNITY): Payer: Self-pay

## 2019-07-25 ENCOUNTER — Encounter (HOSPITAL_COMMUNITY): Payer: Self-pay

## 2019-07-25 DIAGNOSIS — Z01818 Encounter for other preprocedural examination: Secondary | ICD-10-CM | POA: Insufficient documentation

## 2019-07-25 DIAGNOSIS — I350 Nonrheumatic aortic (valve) stenosis: Secondary | ICD-10-CM | POA: Diagnosis not present

## 2019-07-25 DIAGNOSIS — Z20822 Contact with and (suspected) exposure to covid-19: Secondary | ICD-10-CM | POA: Diagnosis not present

## 2019-07-25 HISTORY — DX: Squamous cell carcinoma of skin of nose: C44.321

## 2019-07-25 HISTORY — DX: Personal history of urinary calculi: Z87.442

## 2019-07-25 LAB — CBC
HCT: 44.9 % (ref 39.0–52.0)
Hemoglobin: 14.9 g/dL (ref 13.0–17.0)
MCH: 32.5 pg (ref 26.0–34.0)
MCHC: 33.2 g/dL (ref 30.0–36.0)
MCV: 97.8 fL (ref 80.0–100.0)
Platelets: 166 10*3/uL (ref 150–400)
RBC: 4.59 MIL/uL (ref 4.22–5.81)
RDW: 12.8 % (ref 11.5–15.5)
WBC: 6.3 10*3/uL (ref 4.0–10.5)
nRBC: 0 % (ref 0.0–0.2)

## 2019-07-25 LAB — COMPREHENSIVE METABOLIC PANEL
ALT: 31 U/L (ref 0–44)
AST: 29 U/L (ref 15–41)
Albumin: 4 g/dL (ref 3.5–5.0)
Alkaline Phosphatase: 68 U/L (ref 38–126)
Anion gap: 10 (ref 5–15)
BUN: 17 mg/dL (ref 8–23)
CO2: 28 mmol/L (ref 22–32)
Calcium: 9.4 mg/dL (ref 8.9–10.3)
Chloride: 102 mmol/L (ref 98–111)
Creatinine, Ser: 0.91 mg/dL (ref 0.61–1.24)
GFR calc Af Amer: >60 mL/min (ref >60)
GFR calc non Af Amer: >60 mL/min (ref >60)
Glucose, Bld: 101 mg/dL — ABNORMAL HIGH (ref 70–99)
Potassium: 4.4 mmol/L (ref 3.5–5.1)
Sodium: 140 mmol/L (ref 135–145)
Total Bilirubin: 0.7 mg/dL (ref 0.3–1.2)
Total Protein: 6.5 g/dL (ref 6.5–8.1)

## 2019-07-25 LAB — SARS CORONAVIRUS 2 (TAT 6-24 HRS): SARS Coronavirus 2: NEGATIVE

## 2019-07-25 LAB — URINALYSIS, ROUTINE W REFLEX MICROSCOPIC
Bilirubin Urine: NEGATIVE
Glucose, UA: NEGATIVE mg/dL
Hgb urine dipstick: NEGATIVE
Ketones, ur: NEGATIVE mg/dL
Leukocytes,Ua: NEGATIVE
Nitrite: NEGATIVE
Protein, ur: NEGATIVE mg/dL
Specific Gravity, Urine: 1.014 (ref 1.005–1.030)
pH: 6 (ref 5.0–8.0)

## 2019-07-25 LAB — BRAIN NATRIURETIC PEPTIDE: B Natriuretic Peptide: 216.2 pg/mL — ABNORMAL HIGH (ref 0.0–100.0)

## 2019-07-25 LAB — HEMOGLOBIN A1C
Hgb A1c MFr Bld: 6.4 % — ABNORMAL HIGH (ref 4.8–5.6)
Mean Plasma Glucose: 136.98 mg/dL

## 2019-07-25 LAB — PROTIME-INR
INR: 1 (ref 0.8–1.2)
Prothrombin Time: 12.7 seconds (ref 11.4–15.2)

## 2019-07-25 LAB — SURGICAL PCR SCREEN
MRSA, PCR: NEGATIVE
Staphylococcus aureus: POSITIVE — AB

## 2019-07-25 LAB — GLUCOSE, CAPILLARY: Glucose-Capillary: 144 mg/dL — ABNORMAL HIGH (ref 70–99)

## 2019-07-25 LAB — TYPE AND SCREEN
ABO/RH(D): A POS
Antibody Screen: NEGATIVE

## 2019-07-25 LAB — APTT: aPTT: 34 seconds (ref 24–36)

## 2019-07-25 LAB — ABO/RH: ABO/RH(D): A POS

## 2019-07-25 NOTE — Progress Notes (Signed)
PCP - Dr. Ellison Hughs Cardiologist - Dr. [name]   PPM/ICD - denies  Chest x-ray - 07/25/2019 EKG - 07/25/2019 Stress Test - denies ECHO - 06/12/2019 Cardiac Cath - 06/25/2019  Sleep Study - denies CPAP - N/A   Fasting Blood Sugar -  120 - 135 Checks Blood Sugar 1 time/day  Blood Thinner Instructions: N/A Aspirin Instructions: continue taking up until the day before surgery  ERAS Protcol - No  COVID TEST- Scheduled for today 07/25/2019 after PAT appointment. Patient verbalized understanding of self-quarantine instruction.  Anesthesia review: YES. Cardiac history  Patient denies shortness of breath, fever, cough and chest pain at PAT appointment  All instructions explained to the patient, with a verbal understanding of the material. Patient agrees to go over the instructions while at home for a better understanding. Patient also instructed to self quarantine after being tested for COVID-19. The opportunity to ask questions was provided.

## 2019-07-25 NOTE — Progress Notes (Signed)
[place] ([address]), [place] ([place]) [place] [address] Phone: [phone] Fax: [place] [address], [place] - [place] [facility] [place] [address] Phone: [phone] Fax: [phone]    Your procedure is scheduled on Tuesday, June 29th.  Report to [facility] The Main Entrance "A" at 5:30 A.M., and check in at the Admitting office.  Call this number if you have problems the morning of surgery:  [phone]  Call [phone] if you have any questions prior to your surgery date Monday-Friday 8am-4pm   Remember:  Do not eat or drink after midnight the night before your surgery   Continue taking all current medications without change through the day before surgery.   On the morning of surgery, do NOT take any medications.   WHAT DO I DO ABOUT MY DIABETES MEDICATION?  Marland Kitchen Do not take oral diabetes medicines (pills) the morning of surgery.  HOW TO MANAGE YOUR DIABETES BEFORE AND AFTER SURGERY  Why is it important to control my blood sugar before and after surgery? . Improving blood sugar levels before and after surgery helps healing and can limit problems. . A way of improving blood sugar control is eating a healthy diet by: o  Eating less sugar and carbohydrates o  Increasing activity/exercise o  Talking with your doctor about reaching your blood sugar goals . High blood sugars (greater than 180 mg/dL) can raise your risk of infections and slow your recovery, so you will need to focus on controlling your diabetes during the weeks before surgery. . Make sure that the doctor who takes care of your diabetes knows about your planned surgery including the date and location.  How do I manage my blood sugar before surgery? . Check your blood sugar at least 4 times a day, starting 2 days before surgery, to make sure that the level is not too high or low. . Check your blood  sugar the morning of your surgery when you wake up and every 2 hours until you get to the Short Stay unit. o If your blood sugar is less than 70 mg/dL, you will need to treat for low blood sugar: - Do not take insulin. - Treat a low blood sugar (less than 70 mg/dL) with  cup of clear juice (cranberry or apple), 4 glucose tablets, OR glucose gel. - Recheck blood sugar in 15 minutes after treatment (to make sure it is greater than 70 mg/dL). If your blood sugar is not greater than 70 mg/dL on recheck, call [phone] for further instructions. . Report your blood sugar to the short stay nurse when you get to Short Stay.  . If you are admitted to the hospital after surgery: o Your blood sugar will be checked by the staff and you will probably be given insulin after surgery (instead of oral diabetes medicines) to make sure you have good blood sugar levels. o The goal for blood sugar control after surgery is 80-180 mg/dL.   As of today, STOP taking any Aspirin (unless otherwise instructed by your surgeon) and Aspirin containing products, Aleve, Naproxen, Ibuprofen, Motrin, Advil, Goody's, BC's, all herbal medications, fish oil, and all vitamins.             Do not wear jewelry            Do not wear lotions, powders, colognes, or deodorant.  Men may shave face and neck.            Do not bring valuables to the hospital.            [facility] is not responsible for any belongings or valuables.  Do NOT Smoke (Tobacco/Vapping) or drink Alcohol 24 hours prior to your procedure If you use a CPAP at night, you may bring all equipment for your overnight stay.   Contacts, glasses, dentures or bridgework may not be worn into surgery.      For patients admitted to the hospital, discharge time will be determined by your treatment team.   Patients discharged the day of surgery will not be allowed to drive home, and someone needs to stay with them for 24 hours.  Special instructions:   [facility]- Preparing For Surgery  Before surgery, you can play an important role. Because skin is not sterile, your skin needs to be as free of germs as possible. You can reduce the number of germs on your skin by washing with CHG (chlorahexidine gluconate) Soap before surgery.  CHG is an antiseptic cleaner which kills germs and bonds with the skin to continue killing germs even after washing.    Oral Hygiene is also important to reduce your risk of infection.  Remember - BRUSH YOUR TEETH THE MORNING OF SURGERY WITH YOUR REGULAR TOOTHPASTE  Please do not use if you have an allergy to CHG or antibacterial soaps. If your skin becomes reddened/irritated stop using the CHG.  Do not shave (including legs and underarms) for at least 48 hours prior to first CHG shower. It is OK to shave your face.  Please follow these instructions carefully.   1. Shower the NIGHT BEFORE SURGERY and the MORNING OF SURGERY with CHG Soap.   2. If you chose to wash your hair, wash your hair first as usual with your normal shampoo.  3. After you shampoo, rinse your hair and body thoroughly to remove the shampoo.  4. Use CHG as you would any other liquid soap. You can apply CHG directly to the skin and wash gently with a scrungie or a clean washcloth.   5. Apply the CHG Soap to your body ONLY FROM THE NECK DOWN.  Do not use on open wounds or open sores. Avoid contact with your eyes, ears, mouth and genitals (private parts). Wash Face and genitals (private parts)  with your normal soap.   6. Wash thoroughly, paying special attention to the area where your surgery will be performed.  7. Thoroughly rinse your body with warm water from the neck down.  8. DO NOT shower/wash with your normal soap after using and rinsing off the CHG Soap.  9. Pat yourself dry with a CLEAN TOWEL.  10. Wear CLEAN PAJAMAS to bed the night before surgery, wear comfortable clothes the morning of surgery  11. Place CLEAN SHEETS on your bed the  night of your first shower and DO NOT SLEEP WITH PETS.  Day of Surgery: Shower with CHG soap as instructed above.  Do not apply any deodorants/lotions.  Please wear clean clothes to the hospital/surgery center.   Remember to brush your teeth WITH YOUR REGULAR TOOTHPASTE.   Please read over the following fact sheets that you were given.

## 2019-07-28 MED ORDER — CLINDAMYCIN PHOSPHATE 900 MG/50ML IV SOLN
900.0000 mg | INTRAVENOUS | Status: AC
  Administered 2019-07-29: 900 mg via INTRAVENOUS
  Filled 2019-07-28 (×2): qty 50

## 2019-07-28 MED ORDER — VANCOMYCIN HCL 1250 MG/250ML IV SOLN
1250.0000 mg | INTRAVENOUS | Status: AC
  Administered 2019-07-29: 1250 mg via INTRAVENOUS
  Filled 2019-07-28 (×2): qty 250

## 2019-07-28 MED ORDER — NOREPINEPHRINE 4 MG/250ML-% IV SOLN
0.0000 ug/min | INTRAVENOUS | Status: AC
  Administered 2019-07-29: 5 ug/min via INTRAVENOUS
  Filled 2019-07-28: qty 250

## 2019-07-28 MED ORDER — POTASSIUM CHLORIDE 2 MEQ/ML IV SOLN
80.0000 meq | INTRAVENOUS | Status: DC
  Filled 2019-07-28: qty 40

## 2019-07-28 MED ORDER — SODIUM CHLORIDE 0.9 % IV SOLN
INTRAVENOUS | Status: DC
  Filled 2019-07-28 (×2): qty 30

## 2019-07-28 MED ORDER — MAGNESIUM SULFATE 50 % IJ SOLN
40.0000 meq | INTRAMUSCULAR | Status: DC
  Filled 2019-07-28: qty 9.85

## 2019-07-28 MED ORDER — DEXMEDETOMIDINE HCL IN NACL 400 MCG/100ML IV SOLN
0.1000 ug/kg/h | INTRAVENOUS | Status: AC
  Administered 2019-07-29: .8 ug/kg/h via INTRAVENOUS
  Filled 2019-07-28: qty 100

## 2019-07-28 NOTE — Progress Notes (Signed)
Anesthesia Chart Review:  Case: [id number] Date/Time: 07/29/19 1200   Procedures:      TRANSCATHETER AORTIC VALVE REPLACEMENT, TRANSFEMORAL (N/A )     TRANSESOPHAGEAL ECHOCARDIOGRAM (TEE) (N/A )   Anesthesia type: General   Pre-op diagnosis: Severe Aortic Stenosis   Location: [facility] CATH LAB 6 / [place] INVASIVE CV LAB   Providers: Burnell Blanks, MD    CT Surgeon: Darylene Price, MD   DISCUSSION: Patient is an 86 year old male scheduled for the above procedure.  History includes never smoker, CAD (DES mid RCA 08/27/06) severe aortic stenosis, DM2, HTN, hypercholesterolemia, GERD, carotid artery stenosis (1-39% BICA 07/16/19), skin cancer (SCC, nose), BPH (s/p TURP 03/01/09), partial colectomy (~ 2000, due to diverticulitis).  He needs an ABG on the day of surgery.  Anesthesia team to evaluate on the day of surgery.  07/25/2019 presurgical COVID-19 test negative.   VS: BP 118/73   Pulse 67   Resp 20   Ht 5\' 8"  (1.727 m)   Wt 73.9 kg   SpO2 97%   BMI 24.77 kg/m   PROVIDERS: Sofie Hartigan, MD his PCP [facility] Care Everywhere) [name], MD is cardiologist   LABS: Labs on 07/25/19 include: Lab Results  Component Value Date   WBC 6.3 07/25/2019   HGB 14.9 07/25/2019   HCT 44.9 07/25/2019   PLT 166 07/25/2019   GLUCOSE 101 (H) 07/25/2019   ALT 31 07/25/2019   AST 29 07/25/2019   NA 140 07/25/2019   K 4.4 07/25/2019   CL 102 07/25/2019   CREATININE 0.91 07/25/2019   BUN 17 07/25/2019   CO2 28 07/25/2019   INR 1.0 07/25/2019   HGBA1C 6.4 (H) 07/25/2019    IMAGES: CXR 07/25/19: IMPRESSION: No active cardiopulmonary disease.   EKG: 07/25/19: Sinus rhythm with sinus arrhythmia with 1st degree A-V block Left axis deviation Abnormal ECG Confirmed by Cherlynn Kaiser [id number]) on 07/25/2019 3:03:06 PM   CV: CT coronary 07/21/19: IMPRESSION: 1. Annular measurements (542 mm2) appropriate for 26 mm Edwards Sapien 3 TAVR. 2. There is mild annular calcification under  the [place] extending into the LVOT and in continuity with the anterior mitral valve annulus. 3. Sufficient coronary to annulus distance. 4. Optimal Fluoroscopic Angle for Delivery: LAO 11 CAU 3 5. Severe mitral annular calcification.   Carotid US 07/16/19: Summary:  Right Carotid: Velocities in the right ICA are consistent with a 1-39%  stenosis.  Left Carotid: Velocities in the left ICA are consistent with a 1-39%  stenosis.  Vertebrals: Bilateral vertebral arteries demonstrate antegrade flow.  Subclavians: Normal flow hemodynamics were seen in bilateral subclavian        arteries.    Cardiac cath 9/81/19:  LV end diastolic pressure is normal.  Right heart cath pressures are normal  There is severe aortic valve stenosis. Mean gradient 44.2 mmHg  Prox RCA lesion is 60% stenosed. Stable from prior cath  Previously placed Prox RCA to Mid RCA stent (unknown type) is widely patent.  2nd Diag lesion is 70% stenosed. 3rd Diag lesion is 50% stenosed. -Both lesions are not favorable for PCI. Also stable. SUMMARY  Confirmation of severe aortic stenosis with mean gradient 44.2 mmHg.  Peak to peak 45 mmHg. (By echo was 52 mmHg)  Stable coronary arteries with 50 to 60% proximal RCA followed by brief ectatic segment and then widely patent mid stent.  Otherwise small caliber 1st Diag ~70% ostial stenosis.   Normal right heart cath numbers. Will refer to valve clinic for TAVR  evaluation.   Echo 06/12/19: IMPRESSIONS  1. Since prior ECHO, aortic stenosis has increased to severe.  2. Left ventricular ejection fraction, by estimation, is 60 to 65%. The  left ventricle has normal function. The left ventricle has no regional  wall motion abnormalities. There is mild asymmetric left ventricular  hypertrophy of the basal-septal segment.  Left ventricular diastolic parameters are consistent with Grade I  diastolic dysfunction (impaired relaxation). Elevated left atrial  pressure.   3. Right ventricular systolic function is normal. The right ventricular  size is normal. There is normal pulmonary artery systolic pressure. The  estimated right ventricular systolic pressure is 30.8 mmHg.  4. Left atrial size was moderately dilated.  5. The mitral valve is normal in structure. Moderate mitral valve  regurgitation. No evidence of mitral stenosis.  6. Tricuspid valve regurgitation is moderate.  7. The aortic valve is tricuspid. Aortic valve regurgitation is not  visualized. Severe aortic valve stenosis. Aortic valve area, by VTI  measures 0.49 cm. Aortic valve mean gradient measures 52.5 mmHg. Aortic  valve Vmax measures 4.56 m/s.  8. The inferior vena cava is normal in size with greater than 50%  respiratory variability, suggesting right atrial pressure of 3 mmHg.    24 Hour Holter monitor 09/10/14: NSR with Frequent PVCs,  bigeminal pattern, isolated beats as well as several short runs of NSVT (4 beats) Rare APCs,  Several short runs of atrial tachycardia    Past Medical History:  Diagnosis Date  . Angina   . Aortic valve stenosis, moderate 08/2014   Progression to moderate-severe stenosis: Echo in June 2018 showed mean gradient 30 mmHg, peak gradient 57 mmHg.  . Arthritis   . Bilateral carotid artery disease ([place])    CAROTID DOPPLER,05/21/2008 - Right and left ICA-0-49% diameter reduction, left CCA-0-49% diameter reductiion  . BPH (benign prostatic hypertrophy)   . CAD S/P percutaneous coronary angioplasty 07/2006   PCI to RCA - Promus DES 2.5 mm x 23 mm; 2D ECHO - EF >55%, moderate calcification of the aortic valve leaflets  . Diabetes mellitus   . GERD (gastroesophageal reflux disease)   . High cholesterol   . History of kidney stones    per patient, "a very long time ago and it passed by itself"  . Hypertension    "from the diabetes"  . Squamous cell skin cancer, nasal tip     Past Surgical History:  Procedure Laterality Date  . CARDIAC  CATHETERIZATION with PCI  08/27/2006   RCA-mid - 2.5x80mm Promus stent  . CATARACT EXTRACTION W/ INTRAOCULAR LENS  IMPLANT, BILATERAL    . CATARACT EXTRACTION W/PHACO Right 03/08/2015   Procedure: CATARACT EXTRACTION PHACO AND INTRAOCULAR LENS PLACEMENT (IOC);  Surgeon: Estill Cotta, MD;  Location: [facility] ORS;  Service: Ophthalmology;  Laterality: Right;  [place]: 01:29.4   . COLECTOMY  ~ 2000  . LEFT HEART CATHETERIZATION WITH CORONARY ANGIOGRAM N/A 03/22/2011   Procedure: LEFT HEART CATHETERIZATION WITH CORONARY ANGIOGRAM;  Surgeon: Leonie Man, MD;  Location: [facility] CATH LAB;  Service: Cardiovascular::: Patent RCA stent w/ progression of pRCA Dz to ~50-60%. Progression of oD3 lesion to 60-70% - not optimal for PCI (b/c ostial).  EF 55-60% - no RWMA. Med Rx.  Aortic valve gradient: Peak 22 mmHg, mean 13 mmHg  . NM MYOVIEW LTD  08/2014   LOW RISK. NORMAL.  EF 45-54%.   Marland Kitchen RIGHT/LEFT HEART CATH AND CORONARY ANGIOGRAPHY N/A 06/25/2019   Procedure: RIGHT/LEFT HEART CATH AND CORONARY ANGIOGRAPHY;  Surgeon:  Leonie Man, MD;  Location: [place] CV LAB;  Service: Cardiovascular;  Laterality: N/A;  . TRANSTHORACIC ECHOCARDIOGRAM  04/2017   Normal LV size and function.  EF 60-65%.  GR 1 DD.  Moderate aortic stenosis with estimated valve area between 0.88-0.97 cm.  (Mean gradient 25 mmHg, peak gradient 43 mmHg)  . TRANSTHORACIC ECHOCARDIOGRAM  08/26/2015   Normal LV size with mild concentric hypertrophy. Normal EF 55-60%. No RWMA. GR 1 DD. Severe aortic stenosis (mean gradient 24 mmHg, AVA 0.75 cm) --> progression from 2016. Mild-moderate MR. Mildly dilated RV. Mildly elevated PA pressures (40 mmHg.  Marland Kitchen TRANSTHORACIC ECHOCARDIOGRAM  06/2016   EF 60-65%. GR 1 DD. Moderate to severe aortic stenosis with a mean gradient 30 mmHg, peak gradient of 57 mmHg.   Marland Kitchen TRANSURETHRAL RESECTION OF PROSTATE  ~ 2010    MEDICATIONS: . acetaminophen (TYLENOL) 500 MG tablet  . Artificial Tear Solution ([place] OP)   . ascorbic acid (VITAMIN C) 500 MG tablet  . aspirin EC 81 MG tablet  . benzonatate (TESSALON) 200 MG capsule  . bismuth subsalicylate (PEPTO BISMOL) 262 MG/15ML suspension  . calcium carbonate (OS-CAL) 600 MG TABS tablet  . cetirizine (ZYRTEC) 10 MG tablet  . Cholecalciferol (VITAMIN D3) 2000 UNITS TABS  . CINNAMON PO  . Coenzyme Q10 (COQ-10) 100 MG CAPS  . Cyanocobalamin (B-12) 2500 MCG TABS  . donepezil (ARICEPT) 10 MG tablet  . EPIPEN 2-PAK 0.3 MG/0.3ML SOAJ injection  . ezetimibe (ZETIA) 10 MG tablet  . fluticasone (FLONASE) 50 MCG/ACT nasal spray  . gabapentin (NEURONTIN) 100 MG capsule  . glipiZIDE (GLUCOTROL XL) 2.5 MG 24 hr tablet  . Glucosamine HCl 1000 MG TABS  . ipratropium (ATROVENT) 0.03 % nasal spray  . loperamide (IMODIUM A-D) 2 MG tablet  . methocarbamol (ROBAXIN) 500 MG tablet  . metoprolol succinate (TOPROL-XL) 25 MG 24 hr tablet  . Multiple Vitamin (MULITIVITAMIN WITH MINERALS) TABS  . Omega 3 1200 MG CAPS  . Probiotic CAPS  . pseudoephedrine (SUDAFED) 30 MG tablet  . Pumpkin Seed-Soy Germ (AZO BLADDER CONTROL/GO-LESS PO)  . Saw Palmetto 450 MG CAPS  . simvastatin (ZOCOR) 40 MG tablet  . triamcinolone cream (KENALOG) 0.1 %  . zinc gluconate 50 MG tablet   No current facility-administered medications for this encounter.    Myra Gianotti, PA-C Surgical Short Stay/Anesthesiology [facility] Phone [phone] [facility] Phone [phone] 07/28/2019 11:22 AM

## 2019-07-28 NOTE — Anesthesia Preprocedure Evaluation (Addendum)
Anesthesia Evaluation  Patient identified by MRN, date of birth, ID band Patient awake    Reviewed: Allergy & Precautions, H&P , NPO status , Patient's Chart, lab work & pertinent test results, reviewed documented beta blocker date and time   Airway Mallampati: II  TM Distance: >3 FB Neck ROM: Full    Dental no notable dental hx. (+) Teeth Intact, Dental Advisory Given   Pulmonary neg pulmonary ROS,    Pulmonary exam normal breath sounds clear to auscultation       Cardiovascular hypertension, Pt. on medications and Pt. on home beta blockers + CAD and + Cardiac Stents  + Valvular Problems/Murmurs AS  Rhythm:Regular Rate:Normal + Systolic murmurs    Neuro/Psych negative neurological ROS  negative psych ROS   GI/Hepatic Neg liver ROS, GERD  Medicated,  Endo/Other  diabetes, Type 2, Oral Hypoglycemic Agents  Renal/GU negative Renal ROS  negative genitourinary   Musculoskeletal  (+) Arthritis , Osteoarthritis,    Abdominal   Peds  Hematology negative hematology ROS (+)   Anesthesia Other Findings   Reproductive/Obstetrics negative OB ROS                           Anesthesia Physical Anesthesia Plan  ASA: IV  Anesthesia Plan: MAC   Post-op Pain Management:    Induction: Intravenous  PONV Risk Score and Plan: 2 and Midazolam, Propofol infusion and Ondansetron  Airway Management Planned: Simple Face Mask  Additional Equipment: Arterial line  Intra-op Plan:   Post-operative Plan:   Informed Consent: I have reviewed the patients History and Physical, chart, labs and discussed the procedure including the risks, benefits and alternatives for the proposed anesthesia with the patient or authorized representative who has indicated his/her understanding and acceptance.     Dental advisory given  Plan Discussed with: CRNA  Anesthesia Plan Comments: (PAT note written 07/28/2019 by Myra Gianotti, PA-C. )       Anesthesia Quick Evaluation

## 2019-07-29 ENCOUNTER — Inpatient Hospital Stay (HOSPITAL_COMMUNITY)
Admission: RE | Admit: 2019-07-29 | Discharge: 2019-07-31 | DRG: 266 | Disposition: A | Discharge status: Home / Self Care | Payer: Medicare Other | Attending: Cardiovascular Disease | Admitting: Cardiovascular Disease

## 2019-07-29 ENCOUNTER — Inpatient Hospital Stay (HOSPITAL_COMMUNITY): Payer: Medicare Other

## 2019-07-29 ENCOUNTER — Encounter (HOSPITAL_COMMUNITY)
Admission: RE | Disposition: A | Discharge status: Home / Self Care | Payer: Self-pay | Source: Home / Self Care | Attending: Cardiovascular Disease

## 2019-07-29 ENCOUNTER — Encounter (HOSPITAL_COMMUNITY): Payer: Self-pay | Admitting: Cardiovascular Disease

## 2019-07-29 ENCOUNTER — Other Ambulatory Visit: Payer: Self-pay

## 2019-07-29 ENCOUNTER — Inpatient Hospital Stay (HOSPITAL_COMMUNITY): Payer: Medicare Other | Admitting: Vascular Surgery

## 2019-07-29 DIAGNOSIS — Z885 Allergy status to narcotic agent status: Secondary | ICD-10-CM | POA: Diagnosis not present

## 2019-07-29 DIAGNOSIS — E119 Type 2 diabetes mellitus without complications: Secondary | ICD-10-CM | POA: Diagnosis not present

## 2019-07-29 DIAGNOSIS — I9581 Postprocedural hypotension: Secondary | ICD-10-CM | POA: Diagnosis not present

## 2019-07-29 DIAGNOSIS — Z952 Presence of prosthetic heart valve: Secondary | ICD-10-CM

## 2019-07-29 DIAGNOSIS — Z7982 Long term (current) use of aspirin: Secondary | ICD-10-CM

## 2019-07-29 DIAGNOSIS — Z8042 Family history of malignant neoplasm of prostate: Secondary | ICD-10-CM

## 2019-07-29 DIAGNOSIS — Z955 Presence of coronary angioplasty implant and graft: Secondary | ICD-10-CM

## 2019-07-29 DIAGNOSIS — Z87891 Personal history of nicotine dependence: Secondary | ICD-10-CM

## 2019-07-29 DIAGNOSIS — I5032 Chronic diastolic (congestive) heart failure: Secondary | ICD-10-CM

## 2019-07-29 DIAGNOSIS — Z881 Allergy status to other antibiotic agents status: Secondary | ICD-10-CM | POA: Diagnosis not present

## 2019-07-29 DIAGNOSIS — E7800 Pure hypercholesterolemia, unspecified: Secondary | ICD-10-CM | POA: Diagnosis present

## 2019-07-29 DIAGNOSIS — Z880 Allergy status to penicillin: Secondary | ICD-10-CM

## 2019-07-29 DIAGNOSIS — I5033 Acute on chronic diastolic (congestive) heart failure: Secondary | ICD-10-CM | POA: Diagnosis not present

## 2019-07-29 DIAGNOSIS — Z79899 Other long term (current) drug therapy: Secondary | ICD-10-CM

## 2019-07-29 DIAGNOSIS — N400 Enlarged prostate without lower urinary tract symptoms: Secondary | ICD-10-CM | POA: Diagnosis not present

## 2019-07-29 DIAGNOSIS — I440 Atrioventricular block, first degree: Secondary | ICD-10-CM | POA: Diagnosis present

## 2019-07-29 DIAGNOSIS — Z9049 Acquired absence of other specified parts of digestive tract: Secondary | ICD-10-CM

## 2019-07-29 DIAGNOSIS — Z7984 Long term (current) use of oral hypoglycemic drugs: Secondary | ICD-10-CM | POA: Diagnosis not present

## 2019-07-29 DIAGNOSIS — Z882 Allergy status to sulfonamides status: Secondary | ICD-10-CM

## 2019-07-29 DIAGNOSIS — K219 Gastro-esophageal reflux disease without esophagitis: Secondary | ICD-10-CM | POA: Diagnosis not present

## 2019-07-29 DIAGNOSIS — I700 Atherosclerosis of aorta: Secondary | ICD-10-CM | POA: Diagnosis present

## 2019-07-29 DIAGNOSIS — Z006 Encounter for examination for normal comparison and control in clinical research program: Secondary | ICD-10-CM

## 2019-07-29 DIAGNOSIS — K5792 Diverticulitis of intestine, part unspecified, without perforation or abscess without bleeding: Secondary | ICD-10-CM | POA: Diagnosis present

## 2019-07-29 DIAGNOSIS — E1169 Type 2 diabetes mellitus with other specified complication: Secondary | ICD-10-CM | POA: Diagnosis present

## 2019-07-29 DIAGNOSIS — I493 Ventricular premature depolarization: Secondary | ICD-10-CM | POA: Diagnosis present

## 2019-07-29 DIAGNOSIS — I350 Nonrheumatic aortic (valve) stenosis: Secondary | ICD-10-CM

## 2019-07-29 DIAGNOSIS — I2510 Atherosclerotic heart disease of native coronary artery without angina pectoris: Secondary | ICD-10-CM | POA: Diagnosis not present

## 2019-07-29 DIAGNOSIS — R001 Bradycardia, unspecified: Secondary | ICD-10-CM | POA: Diagnosis present

## 2019-07-29 DIAGNOSIS — I110 Hypertensive heart disease with heart failure: Secondary | ICD-10-CM | POA: Diagnosis present

## 2019-07-29 DIAGNOSIS — Z8719 Personal history of other diseases of the digestive system: Secondary | ICD-10-CM | POA: Diagnosis not present

## 2019-07-29 DIAGNOSIS — E785 Hyperlipidemia, unspecified: Secondary | ICD-10-CM | POA: Diagnosis not present

## 2019-07-29 DIAGNOSIS — I10 Essential (primary) hypertension: Secondary | ICD-10-CM | POA: Diagnosis present

## 2019-07-29 DIAGNOSIS — Z888 Allergy status to other drugs, medicaments and biological substances status: Secondary | ICD-10-CM

## 2019-07-29 HISTORY — DX: Diverticulitis of intestine, part unspecified, without perforation or abscess without bleeding: K57.92

## 2019-07-29 HISTORY — PX: TRANSCATHETER AORTIC VALVE REPLACEMENT, TRANSFEMORAL: SHX6400

## 2019-07-29 HISTORY — PX: TEE WITHOUT CARDIOVERSION: SHX5443

## 2019-07-29 HISTORY — DX: Presence of prosthetic heart valve: Z95.2

## 2019-07-29 LAB — POCT I-STAT, CHEM 8
BUN: 15 mg/dL (ref 8–23)
Calcium, Ion: 1.19 mmol/L (ref 1.15–1.40)
Chloride: 101 mmol/L (ref 98–111)
Creatinine, Ser: 0.6 mg/dL — ABNORMAL LOW (ref 0.61–1.24)
Glucose, Bld: 112 mg/dL — ABNORMAL HIGH (ref 70–99)
HCT: 36 % — ABNORMAL LOW (ref 39.0–52.0)
Hemoglobin: 12.2 g/dL — ABNORMAL LOW (ref 13.0–17.0)
Potassium: 4.3 mmol/L (ref 3.5–5.1)
Sodium: 141 mmol/L (ref 135–145)
TCO2: 25 mmol/L (ref 22–32)

## 2019-07-29 LAB — BLOOD GAS, ARTERIAL
Acid-Base Excess: 3.4 mmol/L — ABNORMAL HIGH (ref 0.0–2.0)
Bicarbonate: 27.5 mmol/L (ref 20.0–28.0)
FIO2: 21
O2 Saturation: 95.3 %
Patient temperature: 37
pCO2 arterial: 42.5 mmHg (ref 32.0–48.0)
pH, Arterial: 7.426 (ref 7.350–7.450)
pO2, Arterial: 76 mmHg — ABNORMAL LOW (ref 83.0–108.0)

## 2019-07-29 LAB — GLUCOSE, CAPILLARY
Glucose-Capillary: 95 mg/dL (ref 70–99)
Glucose-Capillary: 96 mg/dL (ref 70–99)
Glucose-Capillary: 107 mg/dL — ABNORMAL HIGH (ref 70–99)
Glucose-Capillary: 181 mg/dL — ABNORMAL HIGH (ref 70–99)

## 2019-07-29 LAB — POCT ACTIVATED CLOTTING TIME
Activated Clotting Time: 127 seconds
Activated Clotting Time: 121 seconds

## 2019-07-29 SURGERY — IMPLANTATION, AORTIC VALVE, TRANSCATHETER, FEMORAL APPROACH
Anesthesia: Monitor Anesthesia Care

## 2019-07-29 MED ORDER — ACETAMINOPHEN 500 MG PO TABS
1000.0000 mg | ORAL_TABLET | Freq: Once | ORAL | Status: AC
  Administered 2019-07-29: 1000 mg via ORAL
  Filled 2019-07-29: qty 2

## 2019-07-29 MED ORDER — MIDAZOLAM HCL 2 MG/2ML IJ SOLN: 1.0000 mg | Freq: Once | INTRAMUSCULAR | Status: AC

## 2019-07-29 MED ORDER — ASPIRIN EC 81 MG PO TBEC
81.0000 mg | DELAYED_RELEASE_TABLET | Freq: Every day | ORAL | Status: DC
  Administered 2019-07-30 – 2019-07-31 (×2): 81 mg via ORAL
  Filled 2019-07-29 (×2): qty 1

## 2019-07-29 MED ORDER — HEPARIN SODIUM (PORCINE) 1000 UNIT/ML IJ SOLN
INTRAMUSCULAR | Status: DC | PRN
  Administered 2019-07-29: 12000 [IU] via INTRAVENOUS

## 2019-07-29 MED ORDER — ACETAMINOPHEN 500 MG PO TABS
ORAL_TABLET | ORAL | Status: AC
  Filled 2019-07-29: qty 2

## 2019-07-29 MED ORDER — NITROGLYCERIN IN D5W 200-5 MCG/ML-% IV SOLN: 0.0000 ug/min | INTRAVENOUS | Status: DC

## 2019-07-29 MED ORDER — LACTATED RINGERS IV SOLN: INTRAVENOUS | Status: DC

## 2019-07-29 MED ORDER — GABAPENTIN 300 MG PO CAPS
300.0000 mg | ORAL_CAPSULE | Freq: Every day | ORAL | Status: DC
  Administered 2019-07-29 – 2019-07-30 (×2): 300 mg via ORAL
  Filled 2019-07-29 (×2): qty 1

## 2019-07-29 MED ORDER — CHLORHEXIDINE GLUCONATE CLOTH 2 % EX PADS
6.0000 | MEDICATED_PAD | Freq: Every day | CUTANEOUS | Status: DC
  Administered 2019-07-29 – 2019-07-31 (×3): 6 via TOPICAL

## 2019-07-29 MED ORDER — SIMVASTATIN 20 MG PO TABS
40.0000 mg | ORAL_TABLET | Freq: Every day | ORAL | Status: DC
  Administered 2019-07-29 – 2019-07-30 (×2): 40 mg via ORAL
  Filled 2019-07-29 (×2): qty 2

## 2019-07-29 MED ORDER — CHLORHEXIDINE GLUCONATE 4 % EX LIQD: 30.0000 mL | CUTANEOUS | Status: DC

## 2019-07-29 MED ORDER — PHENYLEPHRINE HCL-NACL 20-0.9 MG/250ML-% IV SOLN
0.0000 ug/min | INTRAVENOUS | Status: DC
  Filled 2019-07-29: qty 250

## 2019-07-29 MED ORDER — BENZONATATE 100 MG PO CAPS: 200.0000 mg | ORAL_CAPSULE | Freq: Three times a day (TID) | ORAL | Status: DC | PRN

## 2019-07-29 MED ORDER — MIDAZOLAM HCL 2 MG/2ML IJ SOLN
INTRAMUSCULAR | Status: AC
  Administered 2019-07-29: 1 mg via INTRAVENOUS
  Filled 2019-07-29: qty 2

## 2019-07-29 MED ORDER — FENTANYL CITRATE (PF) 100 MCG/2ML IJ SOLN: 50.0000 ug | Freq: Once | INTRAMUSCULAR | Status: AC

## 2019-07-29 MED ORDER — PROTAMINE SULFATE 10 MG/ML IV SOLN
INTRAVENOUS | Status: DC | PRN
  Administered 2019-07-29: 120 mg via INTRAVENOUS

## 2019-07-29 MED ORDER — LIDOCAINE HCL (PF) 1 % IJ SOLN
INTRAMUSCULAR | Status: DC | PRN
  Administered 2019-07-29: 25 mL

## 2019-07-29 MED ORDER — HEPARIN (PORCINE) IN NACL 1000-0.9 UT/500ML-% IV SOLN
INTRAVENOUS | Status: DC | PRN
  Administered 2019-07-29 (×3): 500 mL

## 2019-07-29 MED ORDER — ACETAMINOPHEN 325 MG PO TABS
650.0000 mg | ORAL_TABLET | Freq: Four times a day (QID) | ORAL | Status: DC | PRN
  Administered 2019-07-30: 650 mg via ORAL
  Filled 2019-07-29: qty 2

## 2019-07-29 MED ORDER — FENTANYL CITRATE (PF) 100 MCG/2ML IJ SOLN
INTRAMUSCULAR | Status: AC
  Administered 2019-07-29: 50 ug via INTRAVENOUS
  Filled 2019-07-29: qty 2

## 2019-07-29 MED ORDER — PHENYLEPHRINE HCL-NACL 10-0.9 MG/250ML-% IV SOLN
INTRAVENOUS | Status: AC
  Filled 2019-07-29: qty 250

## 2019-07-29 MED ORDER — ONDANSETRON HCL 4 MG/2ML IJ SOLN
INTRAMUSCULAR | Status: DC | PRN
  Administered 2019-07-29: 4 mg via INTRAVENOUS

## 2019-07-29 MED ORDER — CLOPIDOGREL BISULFATE 75 MG PO TABS
75.0000 mg | ORAL_TABLET | Freq: Every day | ORAL | Status: DC
  Administered 2019-07-30 – 2019-07-31 (×2): 75 mg via ORAL
  Filled 2019-07-29 (×2): qty 1

## 2019-07-29 MED ORDER — SODIUM CHLORIDE 0.9% FLUSH
3.0000 mL | Freq: Two times a day (BID) | INTRAVENOUS | Status: DC
  Administered 2019-07-29 – 2019-07-31 (×4): 3 mL via INTRAVENOUS

## 2019-07-29 MED ORDER — CHLORHEXIDINE GLUCONATE 0.12 % MT SOLN
OROMUCOSAL | Status: AC
  Filled 2019-07-29: qty 15

## 2019-07-29 MED ORDER — VANCOMYCIN HCL IN DEXTROSE 1-5 GM/200ML-% IV SOLN
1000.0000 mg | Freq: Once | INTRAVENOUS | Status: AC
  Administered 2019-07-30: 1000 mg via INTRAVENOUS
  Filled 2019-07-29: qty 200

## 2019-07-29 MED ORDER — LIDOCAINE HCL (PF) 1 % IJ SOLN
INTRAMUSCULAR | Status: AC
  Filled 2019-07-29: qty 30

## 2019-07-29 MED ORDER — SODIUM CHLORIDE 0.9 % IV SOLN: INTRAVENOUS | Status: DC

## 2019-07-29 MED ORDER — LORATADINE 10 MG PO TABS
10.0000 mg | ORAL_TABLET | Freq: Every day | ORAL | Status: DC
  Administered 2019-07-29 – 2019-07-31 (×3): 10 mg via ORAL
  Filled 2019-07-29 (×3): qty 1

## 2019-07-29 MED ORDER — DONEPEZIL HCL 10 MG PO TABS
10.0000 mg | ORAL_TABLET | Freq: Every day | ORAL | Status: DC
  Administered 2019-07-29 – 2019-07-31 (×3): 10 mg via ORAL
  Filled 2019-07-29 (×4): qty 1

## 2019-07-29 MED ORDER — OXYCODONE HCL 5 MG PO TABS: 5.0000 mg | ORAL_TABLET | ORAL | Status: DC | PRN

## 2019-07-29 MED ORDER — IOHEXOL 350 MG/ML SOLN
INTRAVENOUS | Status: DC | PRN
  Administered 2019-07-29: 60 mL

## 2019-07-29 MED ORDER — INSULIN ASPART 100 UNIT/ML ~~LOC~~ SOLN
0.0000 [IU] | Freq: Three times a day (TID) | SUBCUTANEOUS | Status: DC
  Administered 2019-07-29: 4 [IU] via SUBCUTANEOUS
  Administered 2019-07-30 – 2019-07-31 (×2): 2 [IU] via SUBCUTANEOUS

## 2019-07-29 MED ORDER — SODIUM CHLORIDE 0.9 % IV SOLN
250.0000 mL | INTRAVENOUS | Status: DC | PRN
  Administered 2019-07-30: 250 mL via INTRAVENOUS

## 2019-07-29 MED ORDER — ACETAMINOPHEN 650 MG RE SUPP: 650.0000 mg | Freq: Four times a day (QID) | RECTAL | Status: DC | PRN

## 2019-07-29 MED ORDER — IOHEXOL 350 MG/ML SOLN
INTRAVENOUS | Status: AC
  Filled 2019-07-29: qty 1

## 2019-07-29 MED ORDER — TRAMADOL HCL 50 MG PO TABS: 50.0000 mg | ORAL_TABLET | ORAL | Status: DC | PRN

## 2019-07-29 MED ORDER — PROTAMINE SULFATE 10 MG/ML IV SOLN
INTRAVENOUS | Status: AC
  Filled 2019-07-29: qty 10

## 2019-07-29 MED ORDER — CHLORHEXIDINE GLUCONATE 0.12 % MT SOLN
15.0000 mL | Freq: Once | OROMUCOSAL | Status: AC
  Administered 2019-07-29: 15 mL via OROMUCOSAL

## 2019-07-29 MED ORDER — CHLORHEXIDINE GLUCONATE 4 % EX LIQD: 60.0000 mL | Freq: Once | CUTANEOUS | Status: DC

## 2019-07-29 MED ORDER — MORPHINE SULFATE (PF) 2 MG/ML IV SOLN: 1.0000 mg | INTRAVENOUS | Status: DC | PRN

## 2019-07-29 MED ORDER — HEPARIN (PORCINE) IN NACL 1000-0.9 UT/500ML-% IV SOLN
INTRAVENOUS | Status: AC
  Filled 2019-07-29: qty 1500

## 2019-07-29 MED ORDER — PHENYLEPHRINE HCL (PRESSORS) 10 MG/ML IV SOLN
INTRAVENOUS | Status: DC | PRN
  Administered 2019-07-29 (×2): 80 ug via INTRAVENOUS

## 2019-07-29 MED ORDER — PHENYLEPHRINE HCL-NACL 10-0.9 MG/250ML-% IV SOLN
0.0000 ug/min | INTRAVENOUS | Status: DC
  Administered 2019-07-29: 20 ug/min via INTRAVENOUS
  Administered 2019-07-30: 2 ug/min via INTRAVENOUS
  Administered 2019-07-30: 20 ug/min via INTRAVENOUS
  Filled 2019-07-29 (×2): qty 250

## 2019-07-29 MED ORDER — SODIUM CHLORIDE 0.9 % IV SOLN
INTRAVENOUS | Status: AC
  Administered 2019-07-29: 50 mL/h via INTRAVENOUS

## 2019-07-29 MED ORDER — ONDANSETRON HCL 4 MG/2ML IJ SOLN: 4.0000 mg | Freq: Four times a day (QID) | INTRAMUSCULAR | Status: DC | PRN

## 2019-07-29 MED ORDER — SODIUM CHLORIDE 0.9% FLUSH: 3.0000 mL | INTRAVENOUS | Status: DC | PRN

## 2019-07-29 MED ORDER — LEVOFLOXACIN IN D5W 750 MG/150ML IV SOLN
750.0000 mg | INTRAVENOUS | Status: DC
  Filled 2019-07-29 (×2): qty 150

## 2019-07-29 MED ORDER — EZETIMIBE 10 MG PO TABS
10.0000 mg | ORAL_TABLET | Freq: Every day | ORAL | Status: DC
  Administered 2019-07-29 – 2019-07-31 (×3): 10 mg via ORAL
  Filled 2019-07-29 (×3): qty 1

## 2019-07-29 SURGICAL SUPPLY — 32 items
BAG SNAP BAND KOVER 36X36 (MISCELLANEOUS) ×6 IMPLANT
BLANKET WARM UNDERBOD FULL ACC (MISCELLANEOUS) ×3 IMPLANT
CABLE ADAPT PACING TEMP 12FT (ADAPTER) ×3 IMPLANT
CATH 29 EDWARDS DELIVERY SYS (CATHETERS) ×3 IMPLANT
CATH DIAG 6FR PIGTAIL ANGLED (CATHETERS) ×6 IMPLANT
CATH INFINITI 6F AL2 (CATHETERS) ×3 IMPLANT
CATH S G BIP PACING (CATHETERS) ×3 IMPLANT
CLOSURE MYNX CONTROL 6F/7F (Vascular Products) ×3 IMPLANT
CRIMPER (MISCELLANEOUS) ×3 IMPLANT
DEVICE CLOSURE PERCLS PRGLD 6F (VASCULAR PRODUCTS) ×2 IMPLANT
DEVICE INFLATION ATRION QL2530 (MISCELLANEOUS) ×3 IMPLANT
DEVICE INFLATION ATRION QL38 (MISCELLANEOUS) ×3 IMPLANT
GUIDEWIRE SAFE TJ AMPLATZ EXST (WIRE) ×3 IMPLANT
KIT HEART LEFT (KITS) ×3 IMPLANT
KIT MICROPUNCTURE NIT STIFF (SHEATH) ×3 IMPLANT
PACK CARDIAC CATHETERIZATION (CUSTOM PROCEDURE TRAY) ×3 IMPLANT
PERCLOSE PROGLIDE 6F (VASCULAR PRODUCTS) ×6
SHEATH 16X36 EDWARDS (SHEATH) ×3 IMPLANT
SHEATH BRITE TIP 7FR 35CM (SHEATH) ×3 IMPLANT
SHEATH PINNACLE 6F 10CM (SHEATH) ×3 IMPLANT
SHEATH PINNACLE 8F 10CM (SHEATH) ×3 IMPLANT
SHIELD RADPAD SCOOP 12X17 (MISCELLANEOUS) ×3 IMPLANT
SLEEVE REPOSITIONING LENGTH 30 (MISCELLANEOUS) ×3 IMPLANT
STOPCOCK MORSE 400PSI 3WAY (MISCELLANEOUS) ×9 IMPLANT
SYR MEDRAD MARK V 150ML (SYRINGE) ×3 IMPLANT
TRANSDUCER W/STOPCOCK (MISCELLANEOUS) ×6 IMPLANT
TUBE CONN 8.8X1320 FR HP M-F (CONNECTOR) ×3 IMPLANT
VALVE HEART TRANSCATH SZ3 29MM (Valve) ×3 IMPLANT
WIRE AMPLATZ SS-J .035X180CM (WIRE) ×3 IMPLANT
WIRE EMERALD 3MM-J .035X150CM (WIRE) ×3 IMPLANT
WIRE EMERALD 3MM-J .035X260CM (WIRE) ×3 IMPLANT
WIRE EMERALD ST .035X260CM (WIRE) ×3 IMPLANT

## 2019-07-29 NOTE — Op Note (Signed)
HEART AND VASCULAR CENTER   MULTIDISCIPLINARY HEART VALVE TEAM   TAVR OPERATIVE NOTE   Date of Procedure:  07/29/2019  Preoperative Diagnosis: Severe Aortic Stenosis   Postoperative Diagnosis: Same   Procedure:    Transcatheter Aortic Valve Replacement - Percutaneous Right Transfemoral Approach  Edwards Sapien 3 THV (size 29 mm, model # 9600TFX, serial # [id number])   Co-Surgeons:  Lauree Chandler, MD and Valentina Gu. Roxy Manns, MD   Anesthesiologist:  Arabella Merles, MD  Echocardiographer:  Sanda Klein, MD  Pre-operative Echo Findings:  Severe aortic stenosis  Normal left ventricular systolic function  Post-operative Echo Findings:  No paravalvular leak  Normal left ventricular systolic function   BRIEF CLINICAL NOTE AND INDICATIONS FOR SURGERY  Patient is an 86 year old male with history of aortic stenosis, coronary artery disease status post PCI and stenting in the remote past, hypertension, and hyperlipidemia who has been referred for surgical consultation to discuss treatment options for management of severe symptomatic aortic stenosis.  Patient's cardiac history dates back to 2008 when he first was found to have coronary artery disease associated with abnormal stress test.  He was treated with PCI and stenting of the right coronary artery at that time and has done well.  He has been followed intermittently ever since by Dr. [name].  He has developed aortic stenosis which has gradually progressed in severity on follow-up echocardiographic imaging.  Echocardiogram performed March 06, 2019 revealed severe aortic stenosis with peak velocity across aortic valve measured 4.4 m/s corresponding to mean transvalvular gradient estimated 42 mmHg and aortic valve area estimated 0.79 cm by VTI.  The DVI was reported 0.23.  Left ventricular systolic function remain normal.  Repeat echocardiogram was performed Jun 12, 2019 and confirmed the presence of severe aortic  stenosis with peak velocity across aortic valve measured greater than 4.5 m/s corresponding to mean transvalvular gradient estimated 52 mmHg and aortic valve area calculated only 0.49 cm by VTI.  Left ventricular systolic function remain normal.  Patient was seen in follow-up by [name] at [facility] on Jun 18, 2019 at which time he complained of progressive symptoms of exertional shortness of breath and decreased energy.  Diagnostic cardiac catheterization was performed Jun 25, 2019 and confirmed the presence of severe aortic stenosis with peak to peak and mean transvalvular gradients measured 45 and 44 mmHg by catheterization, respectively.  Patient was noted to have continuous patency of stent in the right coronary artery with otherwise mild nonobstructive coronary artery disease.  Left ventricular systolic function remain normal and right heart pressures were normal.  The patient was referred to the multidisciplinary heart valve clinic and has been evaluated previously by Dr. [name].  CT angiography was performed and the patient was referred for surgical consultation.  During the course of the patient's preoperative work up they have been evaluated comprehensively by a multidisciplinary team of specialists coordinated through the [facility] in the [place] [facility].  They have been demonstrated to suffer from symptomatic severe aortic stenosis as noted above. The patient has been counseled extensively as to the relative risks and benefits of all options for the treatment of severe aortic stenosis including long term medical therapy, conventional surgery for aortic valve replacement, and transcatheter aortic valve replacement.  All questions have been answered, and the patient provides full informed consent for the operation as described.   DETAILS OF THE OPERATIVE PROCEDURE  PREPARATION:    The patient is brought to the operating  room on the  above mentioned date and appropriate monitoring was established by the anesthesia team. The patient is placed in the supine position on the operating table.  Intravenous antibiotics are administered. The patient is monitored closely throughout the procedure under conscious sedation.  Baseline transthoracic echocardiogram was performed. The patient's chest, abdomen, both groins, and both lower extremities are prepared and draped in a sterile manner. A time out procedure is performed.   PERIPHERAL ACCESS:    Using the modified Seldinger technique, femoral arterial and venous access was obtained with placement of 6 Fr sheaths on the left side.  A pigtail diagnostic catheter was passed through the left arterial sheath under fluoroscopic guidance into the aortic root.  A temporary transvenous pacemaker catheter was passed through the left femoral venous sheath under fluoroscopic guidance into the right ventricle.  The pacemaker was tested to ensure stable lead placement and pacemaker capture. Aortic root angiography was performed in order to determine the optimal angiographic angle for valve deployment.   TRANSFEMORAL ACCESS:   Percutaneous transfemoral access and sheath placement was performed using ultrasound guidance.  The right common femoral artery was cannulated using a micropuncture needle and appropriate location was verified using hand injection angiogram.  A pair of Abbott Perclose percutaneous closure devices were placed and a 6 French sheath replaced into the femoral artery.  The patient was heparinized systemically and ACT verified > 250 seconds.    A 16 Fr transfemoral E-sheath was introduced into the right common femoral artery after progressively dilating over an Amplatz superstiff wire. An AL-1 catheter was used to direct a straight-tip exchange length wire across the native aortic valve into the left ventricle. This was exchanged out for a pigtail catheter and position was confirmed in the  LV apex. Simultaneous LV and Ao pressures were recorded.  The pigtail catheter was exchanged for an Amplatz Extra-stiff wire in the LV apex.  Echocardiography was utilized to confirm appropriate wire position and no sign of entanglement in the mitral subvalvular apparatus.   BALLOON AORTIC VALVULOPLASTY:   Balloon aortic valvuloplasty was performed using a 23 mm valvuloplasty balloon.  Once optimal position was achieved, BAV was done under rapid ventricular pacing. The patient recovered well hemodynamically.    TRANSCATHETER HEART VALVE DEPLOYMENT:   An Edwards Sapien 3 transcatheter heart valve (size 29 mm, model #9600TFX, serial #[id number]) was prepared and crimped per manufacturer's guidelines, and the proper orientation of the valve is confirmed on the [employer] delivery system. The valve was advanced through the introducer sheath using normal technique until in an appropriate position in the abdominal aorta beyond the sheath tip. The balloon was then retracted and using the fine-tuning wheel was centered on the valve. The valve was then advanced across the aortic arch using appropriate flexion of the catheter. The valve was carefully positioned across the aortic valve annulus. The Commander catheter was retracted using normal technique. Once final position of the valve has been confirmed by angiographic assessment, the valve is deployed while temporarily holding ventilation and during rapid ventricular pacing to maintain systolic blood pressure < 50 mmHg and pulse pressure < 10 mmHg. The balloon inflation is held for >3 seconds after reaching full deployment volume. Once the balloon has fully deflated the balloon is retracted into the ascending aorta and valve function is assessed using echocardiography. There is felt to be no paravalvular leak and no central aortic insufficiency.  The patient's hemodynamic recovery following valve deployment is good.  The deployment balloon and  guidewire are  both removed.    PROCEDURE COMPLETION:   The sheath was removed and femoral artery closure performed.  Protamine was administered once femoral arterial repair was complete. The temporary pacemaker, pigtail catheters and femoral sheaths were removed with manual pressure used for hemostasis.  A Mynx femoral closure device was utilized following removal of the diagnostic sheath in the left femoral artery.  The patient tolerated the procedure well and is transported to the surgical intensive care in stable condition. There were no immediate intraoperative complications. All sponge instrument and needle counts are verified correct at completion of the operation.   No blood products were administered during the operation.  The patient received a total of 60 mL of intravenous contrast during the procedure.   Rexene Alberts, MD 07/29/2019 2:28 PM

## 2019-07-29 NOTE — Progress Notes (Signed)
  Echocardiogram 2D Echocardiogram has been performed.  Matilde Bash 07/29/2019, 2:40 PM

## 2019-07-29 NOTE — Progress Notes (Addendum)
Patient's ring removed from right ring finger.  Spoke with patient's son Victor [name] and Victor [name] stated that he will call nurse when he arrives to hospital.   Ring is in a labeled pink cup, in a labeled zip lock bag at nurse's station.    Update:  Patient's ring given to patient's son, Victor [name].

## 2019-07-29 NOTE — Interval H&P Note (Signed)
History and Physical Interval Note:  07/29/2019 9:45 AM  Victor [name]  has presented today for surgery, with the diagnosis of Severe Aortic Stenosis.  The various methods of treatment have been discussed with the patient and family. After consideration of risks, benefits and other options for treatment, the patient has consented to  Procedure(s): TRANSCATHETER AORTIC VALVE REPLACEMENT, TRANSFEMORAL (N/A) TRANSESOPHAGEAL ECHOCARDIOGRAM (TEE) (N/A) as a surgical intervention.  The patient's history has been reviewed, patient examined, no change in status, stable for surgery.  I have reviewed the patient's chart and labs.  Questions were answered to the patient's satisfaction.     Rexene Alberts

## 2019-07-29 NOTE — Transfer of Care (Signed)
Immediate Anesthesia Transfer of Care Note  Patient: [name]  Procedure(s) Performed: TRANSCATHETER AORTIC VALVE REPLACEMENT, TRANSFEMORAL (N/A ) TRANSESOPHAGEAL ECHOCARDIOGRAM (TEE) (N/A )  Patient Location: PACU and Cath Lab  Anesthesia Type:MAC  Level of Consciousness: awake and drowsy  Airway & Oxygen Therapy: Patient Spontanous Breathing and Patient connected to face mask oxygen  Post-op Assessment: Report given to RN and Post -op Vital signs reviewed and stable  Post vital signs: Reviewed and stable  Last Vitals:  Vitals Value Taken Time  BP 153/72 07/29/19 1458  Temp    Pulse 58 07/29/19 1500  Resp 15 07/29/19 1500  SpO2 100 % 07/29/19 1500  Vitals shown include unvalidated device data.  Last Pain:  Vitals:   07/29/19 1215  TempSrc:   PainSc: 0-No pain      Patients Stated Pain Goal: 3 (03/54/65 6812)  Complications: No complications documented.

## 2019-07-29 NOTE — Progress Notes (Signed)
Patient's clothes and 1 ring sent with son, Merry Proud.

## 2019-07-29 NOTE — Anesthesia Procedure Notes (Signed)
Procedure Name: MAC Date/Time: 07/29/2019 1:07 PM Performed by: Inda Coke, CRNA Pre-anesthesia Checklist: Patient identified, Emergency Drugs available, Suction available, Timeout performed and Patient being monitored Patient Re-evaluated:Patient Re-evaluated prior to induction Oxygen Delivery Method: Simple face mask Induction Type: IV induction Ventilation: Oral airway inserted - appropriate to patient size Dental Injury: Teeth and Oropharynx as per pre-operative assessment

## 2019-07-29 NOTE — CV Procedure (Signed)
[facility]  TAVR OPERATIVE NOTE   Date of Procedure:  07/29/2019  Preoperative Diagnosis: Severe Aortic Stenosis   Postoperative Diagnosis: Same   Procedure:    Transcatheter Aortic Valve Replacement - Transfemoral Approach  Edwards Sapien 3 THV (size 29 mm, model # [id number], serial # [id number])   Co-Surgeons:  Lauree Chandler, MD and Valentina Gu. Roxy Manns, MD   Anesthesiologist:  Ola Spurr  Echocardiographer:  Croitoru  Pre-operative Echo Findings:  Severe aortic stenosis  Normal left ventricular systolic function  Post-operative Echo Findings:  No paravalvular leak  Normal left ventricular systolic function  BRIEF CLINICAL NOTE AND INDICATIONS FOR SURGERY  86 yo male with history of arthritis, carotid artery disease, BPH, CAD, DM, GERD, hyperlipidemia, HTN, diverticulitis s/p partial colectomy and critical aortic stenosis who is here today for TAVR. He is known to have CAD. PCI of the RCA in 2008. Cardiac cath 06/25/19 with patent RCA stent, otherwise stable disease with moderate RCA and moderate Diagonal stenosis. Normal right heart pressures. Echo 06/12/19 with LVEF=60-65%. Moderate mitral regurgitation. The aortic valve leaflets are severely thickened and calcified with limited leaflet excursion c/w critical AS. (mean gradient 52.5 mmHg, peak gradient 83 mmHg, AVA 0.49 cm2. Dimensionless index 0.14).   During the course of the patient's preoperative work up they have been evaluated comprehensively by a multidisciplinary team of specialists coordinated through the [facility] in the [facility].  They have been demonstrated to suffer from symptomatic severe aortic stenosis as noted above. The patient has been counseled extensively as to the relative risks and benefits of all options for the treatment of severe aortic stenosis including long term medical therapy, conventional surgery for aortic valve  replacement, and transcatheter aortic valve replacement.  The patient has been independently evaluated by Dr. Roxy Manns with CT surgery and they are felt to be at high risk for conventional surgical aortic valve replacement. The surgeon indicated the patient would be a poor candidate for conventional surgery. Based upon review of all of the patient's preoperative diagnostic tests they are felt to be candidate for transcatheter aortic valve replacement using the transfemoral approach as an alternative to high risk conventional surgery.    Following the decision to proceed with transcatheter aortic valve replacement, a discussion has been held regarding what types of management strategies would be attempted intraoperatively in the event of life-threatening complications, including whether or not the patient would be considered a candidate for the use of cardiopulmonary bypass and/or conversion to open sternotomy for attempted surgical intervention.  The patient has been advised of a variety of complications that might develop peculiar to this approach including but not limited to risks of death, stroke, paravalvular leak, aortic dissection or other major vascular complications, aortic annulus rupture, device embolization, cardiac rupture or perforation, acute myocardial infarction, arrhythmia, heart block or bradycardia requiring permanent pacemaker placement, congestive heart failure, respiratory failure, renal failure, pneumonia, infection, other late complications related to structural valve deterioration or migration, or other complications that might ultimately cause a temporary or permanent loss of functional independence or other long term morbidity.  The patient provides full informed consent for the procedure as described and all questions were answered preoperatively.    DETAILS OF THE OPERATIVE PROCEDURE  PREPARATION:   The patient is brought to the operating room on the above mentioned date and central  monitoring was established by the anesthesia team including placement of a radial arterial line. The patient is placed in the  supine position on the operating table.  Intravenous antibiotics are administered. Conscious sedation is used.   Baseline transthoracic echocardiogram was performed. The patient's chest, abdomen, both groins, and both lower extremities are prepared and draped in a sterile manner. A time out procedure is performed.   PERIPHERAL ACCESS:   Using the modified Seldinger technique, femoral arterial and venous access were obtained with placement of 6 Fr sheaths on the left side using u/s guidance.  A pigtail diagnostic catheter was passed through the femoral arterial sheath under fluoroscopic guidance into the aortic root.  A temporary transvenous pacemaker catheter was passed through the femoral venous sheath under fluoroscopic guidance into the right ventricle.  The pacemaker was tested to ensure stable lead placement and pacemaker capture. Aortic root angiography was performed in order to determine the optimal angiographic angle for valve deployment.  TRANSFEMORAL ACCESS:  A micropuncture kit was used to gain access to the right femoral artery using u/s guidance. Position confirmed with angiography. Pre-closure with double ProGlide closure devices. The patient was heparinized systemically and ACT verified > 250 seconds.    A 16 Fr transfemoral E-sheath was introduced into the right femoral artery after progressively dilating over an Amplatz superstiff wire. An AL-2 catheter was used to direct a straight-tip exchange length wire across the native aortic valve into the left ventricle. This was exchanged out for a pigtail catheter and position was confirmed in the LV apex. Simultaneous LV and Ao pressures were recorded.  The pigtail catheter was then exchanged for an Amplatz Extra-stiff wire in the LV apex.   TRANSCATHETER HEART VALVE DEPLOYMENT:  An Edwards Sapien 3 THV (size 29 mm)  was prepared and crimped per manufacturer's guidelines, and the proper orientation of the valve is confirmed on the [employer] delivery system. The valve was advanced through the introducer sheath using normal technique until in an appropriate position in the abdominal aorta beyond the sheath tip. The balloon was then retracted and using the fine-tuning wheel was centered on the valve. The valve was then advanced across the aortic arch using appropriate flexion of the catheter. The valve was carefully positioned across the aortic valve annulus. The Commander catheter was retracted using normal technique. Once final position of the valve has been confirmed by angiographic assessment, the valve is deployed while temporarily holding ventilation and during rapid ventricular pacing to maintain systolic blood pressure < 50 mmHg and pulse pressure < 10 mmHg. The balloon inflation is held for >3 seconds after reaching full deployment volume. Once the balloon has fully deflated the balloon is retracted into the ascending aorta and valve function is assessed using TTE. There is felt to be no paravalvular leak and no central aortic insufficiency.  The patient's hemodynamic recovery following valve deployment is good.  The deployment balloon and guidewire are both removed. Echo demostrated acceptable post-procedural gradients, stable mitral valve function, and no AI.   PROCEDURE COMPLETION:  The sheath was then removed and closure devices were completed. Protamine was administered once femoral arterial repair was complete. The temporary pacemaker, pigtail catheters and femoral sheaths were removed with a Mynx closure device placed in the left femoral artery and manual pressure used for venous hemostasis.    The patient tolerated the procedure well and is transported to the surgical intensive care in stable condition. There were no immediate intraoperative complications. All sponge instrument and needle counts are  verified correct at completion of the operation.   No blood products were administered during the operation.  The patient received a total of 60 mL of intravenous contrast during the procedure.  Lauree Chandler MD 07/29/2019 2:48 PM

## 2019-07-29 NOTE — Discharge Instructions (Signed)
ACTIVITY AND EXERCISE °• Daily activity and exercise are an important part of your recovery. People recover at different rates depending on their general health and type of valve procedure. °• Most people recovering from TAVR feel better relatively quickly  °• No lifting, pushing, pulling more than 10 pounds (examples to avoid: groceries, vacuuming, gardening, golfing): °            - For one week with a procedure through the groin. °            - For six weeks for procedures through the chest wall or neck. °NOTE: You will typically see one of our providers 7-14 days after your procedure to discuss WHEN TO RESUME the above activities.  °  °  °DRIVING °• Do not drive until you are seen for follow up and cleared by a provider. Generally, we ask patient to not drive for 1 week after their procedure. °• If you have been told by your doctor in the past that you may not drive, you must talk with him/her before you begin driving again. °  °DRESSING °• Groin site: you may leave the clear dressing over the site for up to one week or until it falls off. °  °HYGIENE °• If you had a femoral (leg) procedure, you may take a shower when you return home. After the shower, pat the site dry. Do NOT use powder, oils or lotions in your groin area until the site has completely healed. °• If you had a chest procedure, you may shower when you return home unless specifically instructed not to by your discharging practitioner. °            - DO NOT scrub incision; pat dry with a towel. °            - DO NOT apply any lotions, oils, powders to the incision. °            - No tub baths / swimming for at least 2 weeks. °• If you notice any fevers, chills, increased pain, swelling, bleeding or pus, please contact your doctor. °  °ADDITIONAL INFORMATION °• If you are going to have an upcoming dental procedure, please contact our office as you will require antibiotics ahead of time to prevent infection on your heart valve.  ° ° °If you have any  questions or concerns you can call the structural heart phone during normal business hours 8am-4pm. If you have an urgent need after hours or weekends please call [phone] to talk to the on call provider for general cardiology. If you have an emergency that requires immediate attention, please call 911.  ° ° °After TAVR Checklist ° °Check  Test Description  ° Follow up appointment in 1-2 weeks  You will see our structural heart physician assistant, Katie Joie Reamer. Your incision sites will be checked and you will be cleared to drive and resume all normal activities if you are doing well.    ° 1 month echo and follow up  You will have an echo to check on your new heart valve and be seen back in the office by Katie Sia Gabrielsen. Many times the echo is not read by your appointment time, but Katie will call you later that day or the following day to report your results.  ° Follow up with your primary cardiologist You will need to be seen by your primary cardiologist in the following 3-6 months after your 1 month appointment in the valve   clinic. Often times your Plavix or Aspirin will be discontinued during this time, but this is decided on a case by case basis.   ° 1 year echo and follow up You will have another echo to check on your heart valve after 1 year and be seen back in the office by Katie Keshara Kiger. This your last structural heart visit.  ° Bacterial endocarditis prophylaxis  You will have to take antibiotics for the rest of your life before all dental procedures (even teeth cleanings) to protect your heart valve. Antibiotics are also required before some surgeries. Please check with your cardiologist before scheduling any surgeries. Also, please make sure to tell us if you have a penicillin allergy as you will require an alternative antibiotic.   ° ° °

## 2019-07-29 NOTE — Progress Notes (Signed)
  [facility] VALVE TEAM  Patient doing well s/p TAVR. He has been hyptensive requiring a levophed gtt. Groin sites stable. ECG with sinus no high grade block. Given need for pressors will send him to the unit. Plan for early ambulation after bedrest completed and hopeful discharge over the next 24-48 hours.   [name] PA-C  [facility]  Pager [phone]

## 2019-07-30 ENCOUNTER — Encounter (HOSPITAL_COMMUNITY): Payer: Self-pay | Admitting: Cardiovascular Disease

## 2019-07-30 ENCOUNTER — Inpatient Hospital Stay (HOSPITAL_COMMUNITY): Payer: Medicare Other

## 2019-07-30 DIAGNOSIS — Z952 Presence of prosthetic heart valve: Secondary | ICD-10-CM

## 2019-07-30 DIAGNOSIS — I350 Nonrheumatic aortic (valve) stenosis: Principal | ICD-10-CM

## 2019-07-30 LAB — BASIC METABOLIC PANEL
Anion gap: 11 (ref 5–15)
BUN: 11 mg/dL (ref 8–23)
CO2: 24 mmol/L (ref 22–32)
Calcium: 8.6 mg/dL — ABNORMAL LOW (ref 8.9–10.3)
Chloride: 105 mmol/L (ref 98–111)
Creatinine, Ser: 0.67 mg/dL (ref 0.61–1.24)
GFR calc Af Amer: >60 mL/min (ref >60)
GFR calc non Af Amer: >60 mL/min (ref >60)
Glucose, Bld: 93 mg/dL (ref 70–99)
Potassium: 3.8 mmol/L (ref 3.5–5.1)
Sodium: 140 mmol/L (ref 135–145)

## 2019-07-30 LAB — GLUCOSE, CAPILLARY
Glucose-Capillary: 98 mg/dL (ref 70–99)
Glucose-Capillary: 119 mg/dL — ABNORMAL HIGH (ref 70–99)
Glucose-Capillary: 127 mg/dL — ABNORMAL HIGH (ref 70–99)
Glucose-Capillary: 101 mg/dL — ABNORMAL HIGH (ref 70–99)

## 2019-07-30 LAB — MAGNESIUM: Magnesium: 1.9 mg/dL (ref 1.7–2.4)

## 2019-07-30 LAB — ECHOCARDIOGRAM LIMITED
Height: 68 in
Weight: 2546.75 oz

## 2019-07-30 LAB — CBC
HCT: 39 % (ref 39.0–52.0)
Hemoglobin: 13 g/dL (ref 13.0–17.0)
MCH: 31.9 pg (ref 26.0–34.0)
MCHC: 33.3 g/dL (ref 30.0–36.0)
MCV: 95.6 fL (ref 80.0–100.0)
Platelets: 130 10*3/uL — ABNORMAL LOW (ref 150–400)
RBC: 4.08 MIL/uL — ABNORMAL LOW (ref 4.22–5.81)
RDW: 12.6 % (ref 11.5–15.5)
WBC: 7.7 10*3/uL (ref 4.0–10.5)
nRBC: 0 % (ref 0.0–0.2)

## 2019-07-30 MED ORDER — CLINDAMYCIN PHOSPHATE 900 MG/50ML IV SOLN
900.0000 mg | Freq: Once | INTRAVENOUS | Status: AC
  Administered 2019-07-30: 900 mg via INTRAVENOUS
  Filled 2019-07-30: qty 50

## 2019-07-30 MED ORDER — CLINDAMYCIN PHOSPHATE 600 MG/50ML IV SOLN
600.0000 mg | Freq: Once | INTRAVENOUS | Status: DC
  Filled 2019-07-30: qty 50

## 2019-07-30 NOTE — Anesthesia Postprocedure Evaluation (Signed)
Anesthesia Post Note  Patient: Victor [name]  Procedure(s) Performed: TRANSCATHETER AORTIC VALVE REPLACEMENT, TRANSFEMORAL (N/A ) TRANSESOPHAGEAL ECHOCARDIOGRAM (TEE) (N/A )     Patient location during evaluation: Other Anesthesia Type: MAC Level of consciousness: awake and alert Pain management: pain level controlled Vital Signs Assessment: post-procedure vital signs reviewed and stable Respiratory status: spontaneous breathing, nonlabored ventilation and respiratory function stable Cardiovascular status: stable and blood pressure returned to baseline Postop Assessment: no apparent nausea or vomiting Anesthetic complications: no   No complications documented.  Last Vitals:  Vitals:   07/30/19 0600 07/30/19 0630  BP: 107/75 (!) 101/52  Pulse: 66 64  Resp: 18 18  Temp:    SpO2: 92% 96%    Last Pain:  Vitals:   07/30/19 0400  TempSrc:   PainSc: 0-No pain                 Shylo Dillenbeck,W. EDMOND

## 2019-07-30 NOTE — Progress Notes (Signed)
  Echocardiogram 2D Echocardiogram has been performed.  Victor [name] 07/30/2019, 11:32 AM

## 2019-07-30 NOTE — Progress Notes (Addendum)
[facility] VALVE TEAM  Patient Name: Victor [name] Date of Encounter: 07/30/2019  Primary Cardiologist: Dr. [name] / Dr. Buena Irish & Dr. Roxy Manns (TAVR)  Hospital Problem List     Principal Problem:   S/P TAVR (transcatheter aortic valve replacement) Active Problems:   Essential hypertension   Hyperlipidemia with target LDL less than 70   Diabetes mellitus type II, controlled ([place])   BPH (benign prostatic hyperplasia)   Severe calcific aortic valve stenosis   CAD S/P percutaneous coronary angioplasty --> PCI RCA Promus DES 2.5 mm x 23 mm   Bradycardia   Symptomatic PVCs   Acute on chronic diastolic heart failure ([place])   Diverticulitis     Subjective   Sitting up in chair. No complaints.   Inpatient Medications    Scheduled Meds:  aspirin EC  81 mg Oral Daily   Chlorhexidine Gluconate Cloth  6 each Topical Daily   clopidogrel  75 mg Oral Q breakfast   donepezil  10 mg Oral Daily   ezetimibe  10 mg Oral Daily   gabapentin  300 mg Oral QHS   insulin aspart  0-24 Units Subcutaneous TID AC & HS   loratadine  10 mg Oral Daily   simvastatin  40 mg Oral QHS   sodium chloride flush  3 mL Intravenous Q12H   Continuous Infusions:  sodium chloride 10 mL/hr at 07/30/19 0800   nitroGLYCERIN     phenylephrine (NEO-SYNEPHRINE) Adult infusion 10 mcg/min (07/30/19 0800)   PRN Meds: sodium chloride, acetaminophen **OR** acetaminophen, benzonatate, ondansetron (ZOFRAN) IV, sodium chloride flush, traMADol   Vital Signs    Vitals:   07/30/19 0730 07/30/19 0800 07/30/19 0830 07/30/19 0900  BP: (!) 98/51   (!) 111/54  Pulse: 78 85 (!) 42 (!) 43  Resp: 18 17 14 20   Temp:      TempSrc:      SpO2: 95% 95% 95% 92%  Weight:      Height:        Intake/Output Summary (Last 24 hours) at 07/30/2019 0959 Last data filed at 07/30/2019 0800 Gross per 24 hour  Intake 2240.91 ml  Output 2476 ml  Net -235.09 ml   Filed Weights   07/29/19 0714  07/30/19 0600  Weight: 73.4 kg 72.2 kg    Physical Exam   GEN: Well nourished, well developed, in no acute distress.  HEENT: Grossly normal.  Neck: Supple, no JVD, carotid bruits, or masses. Cardiac: RRR, no murmurs, rubs, or gallops. No clubbing, cyanosis, edema.   Respiratory:  Respirations regular and unlabored, clear to auscultation bilaterally. GI: Soft, nontender, nondistended, BS + x 4. MS: no deformity or atrophy. Skin: warm and dry, no rash.  Groin sites clear without hematoma or ecchymosis  Neuro:  Strength and sensation are intact. Psych: AAOx3.  Normal affect.  Labs    CBC Recent Labs    07/29/19 1535 07/30/19 0333  WBC  --  7.7  HGB 12.2* 13.0  HCT 36.0* 39.0  MCV  --  95.6  PLT  --  332*   Basic Metabolic Panel Recent Labs    07/29/19 1535 07/30/19 0333  NA 141 140  K 4.3 3.8  CL 101 105  CO2  --  24  GLUCOSE 112* 93  BUN 15 11  CREATININE 0.60* 0.67  CALCIUM  --  8.6*  MG  --  1.9   Liver Function Tests No results for input(s): AST, ALT, ALKPHOS, BILITOT, PROT,  ALBUMIN in the last 72 hours. No results for input(s): LIPASE, AMYLASE in the last 72 hours. Cardiac Enzymes No results for input(s): CKTOTAL, CKMB, CKMBINDEX, TROPONINI in the last 72 hours. BNP Invalid input(s): POCBNP D-Dimer No results for input(s): DDIMER in the last 72 hours. Hemoglobin A1C No results for input(s): HGBA1C in the last 72 hours. Fasting Lipid Panel No results for input(s): CHOL, HDL, LDLCALC, TRIG, CHOLHDL, LDLDIRECT in the last 72 hours. Thyroid Function Tests No results for input(s): TSH, T4TOTAL, T3FREE, THYROIDAB in the last 72 hours.  Invalid input(s): FREET3  Telemetry    Sinus with PACs and PVCs - Personally Reviewed  ECG    Sinus with 1st deg AV block - Personally Reviewed  Radiology    ECHOCARDIOGRAM LIMITED  Result Date: 07/29/2019    ECHOCARDIOGRAM LIMITED REPORT   Patient Name:   Victor [name] Date of Exam: 07/29/2019 Medical Rec #:   [id number]      Height:       68.0 in Accession #:    [id number]     Weight:       161.8 lb Date of Birth:  [birth year]      BSA:          1.868 m Patient Age:    86 years       BP:           124/64 mmHg Patient Gender: M              HR:           81 bpm. Exam Location:  Inpatient Procedure: Limited Echo, Cardiac Doppler and Color Doppler Indications:     TAVR  History:         Patient has prior history of Echocardiogram examinations, most                  recent 06/12/2019. CAD, Aortic Valve Disease; Risk                  Factors:Hypertension, Diabetes and Dyslipidemia.                  Aortic Valve: Edwards Sapien prosthetic, stented (TAVR) valve                  is present in the aortic position. Procedure Date: 07/29/2019.  Sonographer:     Dustin Flock Referring Phys:  [place] Diagnosing Phys: Sanda Klein MD                   PRE-PROCEDURAL FINDINGS:                   Normal left ventricular systolic function. Estimated LVEF                  55-60%.                  There are no regional wall motion abnormalities.                  Severe calcific aortic stenosis. Trileaflet aortic valve.                  Peak aortic valve gradient 71 mm Hg, mean gradient 44 mm Hg.                  Dimensionless obstructive index 0.14, calculated aortic valve  area is 0.6 cm.                  Mild aortic insufficiency.                  Mild mitral insufficiency (eccentric anteriorly directed, may                  be underestimated).                  No pericardial effusion.                   POST-PROCEDURAL FINDINGS:                   Hyperdynamic left ventricular systolic function. Estimated LVEF                  75%.                  There are no regional wall motion abnormalities.                  Well-seated TAVR stent-valve.                  Peak aortic valve gradient 3 mm Hg, mean gradient 2 mm Hg.                  Dimensionless obstructive index 0.64, calculated aortic valve                   area is 3.2 cm.                  There is no aortic insufficiency and no perivalvular leak.                  Mitral insufficiency appears unchanged.                  No pericardial effusion. IMPRESSIONS  1. Left ventricular ejection fraction, by estimation, is 55 to 60%. The left ventricle has normal function. The left ventricle has no regional wall motion abnormalities. There is mild concentric left ventricular hypertrophy.  2. The mitral valve is myxomatous. Mild mitral valve regurgitation.  3. The aortic valve is tricuspid. Severe aortic valve stenosis. There is a Transport planner prosthetic (TAVR) valve present in the aortic position. Procedure Date: 07/29/2019. FINDINGS  Left Ventricle: Left ventricular ejection fraction, by estimation, is 55 to 60%. The left ventricle has normal function. The left ventricle has no regional wall motion abnormalities. There is mild concentric left ventricular hypertrophy. Mitral Valve: The mitral valve is myxomatous. Moderate to severe mitral annular calcification. Mild mitral valve regurgitation. Aortic Valve: The aortic valve is tricuspid. . There is severe thickening and severe calcifcation of the aortic valve. Severe aortic stenosis is present. There is severe thickening of the aortic valve. There is severe calcifcation of the aortic valve. Aortic valve mean gradient measures 2.0 mmHg. Aortic valve peak gradient measures 3.5 mmHg. Aortic valve area, by VTI measures 3.23 cm. There is a Transport planner prosthetic, stented (TAVR) valve present in the aortic position. Procedure Date: 07/29/2019. Pulmonic Valve: The pulmonic valve was normal in structure. Pulmonic valve regurgitation is not visualized.  LEFT VENTRICLE PLAX 2D LVOT diam:     2.30 cm LV SV:         67 LV SV Index:   36 LVOT Area:     4.15 cm  AORTIC VALVE AV Area (Vmax):  2.68 cm AV Area (Vmean):   2.98 cm AV Area (VTI):     3.23 cm AV Vmax:           93.20 cm/s AV Vmean:          64.200 cm/s AV  VTI:            0.207 m AV Peak Grad:      3.5 mmHg AV Mean Grad:      2.0 mmHg LVOT Vmax:         60.10 cm/s LVOT Vmean:        46.000 cm/s LVOT VTI:          0.161 m LVOT/AV VTI ratio: 0.78  SHUNTS Systemic VTI:  0.16 m Systemic Diam: 2.30 cm Sanda Klein MD Electronically signed by Sanda Klein MD Signature Date/Time: 07/29/2019/3:06:26 PM    Final    Structural Heart Procedure  Result Date: 07/29/2019 See surgical note for result.   Cardiac Studies   TAVR OPERATIVE NOTE     Date of Procedure:                07/29/2019   Preoperative Diagnosis:      Severe Aortic Stenosis    Postoperative Diagnosis:    Same    Procedure:        Transcatheter Aortic Valve Replacement - Percutaneous Right Transfemoral Approach             Edwards Sapien 3 THV (size 29 mm, model # [id number], serial # [id number])              Co-Surgeons:                        Lauree Chandler, MD and Valentina Gu. Roxy Manns, MD    Anesthesiologist:                  Arabella Merles, MD   Echocardiographer:              Sanda Klein, MD   Pre-operative Echo Findings: Severe aortic stenosis Normal left ventricular systolic function   Post-operative Echo Findings: No paravalvular leak Normal left ventricular systolic function   ____________________   Echo 07/30/19: pending   Patient Profile     Aidin L Vancott is a 86 y.o. male with a history of arthritis, carotid artery disease, BPH, CAD, DM, GERD, HLD, HTN, diverticulitis s/p partial colectomy and critical aortic stenosis who presented to [facility] on 07/29[facility]19 for panned TAVR.   Assessment & Plan    Severe AS: s/p successful TAVR with a 29 mm Edwards Sapien 3 THV via the TF approach on 07/29/19. Post operative echo pending. Groin sites are stable. ECG with sinus 1st deg AV block. There is no high grade heart block. Continued on Asprin and started on plavix 75 mg daily. Plan to wean on neo gtt and transfer to 4E.   HTN: BP soft- still on Neo gtt. Once off  pressors, will transfer to 4E.   CAD: pre TAVR cath showed stable coronary arteries with 50 to 60% proximal RCA followed by brief ecstatic segment and then widely patent mid stent.  Otherwise, small caliber 1st Diag ~70% ostial stenosis. Continue medical therapy.   DMT2: continue SSI   Signed, [name], PA-C  07/30/2019, 9:59 AM  Pager [phone]  I have personally seen and examined this patient. I agree with the assessment and plan as outlined above.  He is doing well one  day post TAVR. STill on low dose Neo for soft BP. Groins stable. Labs reviewed and stable. Sinus on tele.  Will attempt to stop Neo today and transfer to 4E. Echo today.   Lauree Chandler 07/30/2019 10:50 AM

## 2019-07-30 NOTE — Progress Notes (Signed)
  Echocardiogram 2D Echocardiogram has been performed.  Victor [name] 07/30/2019, 11:30 AM

## 2019-07-31 ENCOUNTER — Ambulatory Visit: Payer: Medicare Other | Admitting: Podiatry

## 2019-07-31 ENCOUNTER — Other Ambulatory Visit: Payer: Self-pay | Admitting: Physician Assistant

## 2019-07-31 ENCOUNTER — Ambulatory Visit: Payer: Medicare Other | Admitting: Cardiology

## 2019-07-31 DIAGNOSIS — Z952 Presence of prosthetic heart valve: Secondary | ICD-10-CM

## 2019-07-31 LAB — POCT I-STAT 7, (LYTES, BLD GAS, ICA,H+H)
Acid-Base Excess: 4 mmol/L — ABNORMAL HIGH (ref 0.0–2.0)
Bicarbonate: 29.3 mmol/L — ABNORMAL HIGH (ref 20.0–28.0)
Calcium, Ion: 1.21 mmol/L (ref 1.15–1.40)
HCT: 37 % — ABNORMAL LOW (ref 39.0–52.0)
Hemoglobin: 12.6 g/dL — ABNORMAL LOW (ref 13.0–17.0)
O2 Saturation: 100 %
Potassium: 4.4 mmol/L (ref 3.5–5.1)
Sodium: 141 mmol/L (ref 135–145)
TCO2: 31 mmol/L (ref 22–32)
pCO2 arterial: 43.8 mmHg (ref 32.0–48.0)
pH, Arterial: 7.433 (ref 7.350–7.450)
pO2, Arterial: 215 mmHg — ABNORMAL HIGH (ref 83.0–108.0)

## 2019-07-31 LAB — BASIC METABOLIC PANEL
Anion gap: 6 (ref 5–15)
BUN: 10 mg/dL (ref 8–23)
CO2: 28 mmol/L (ref 22–32)
Calcium: 8.6 mg/dL — ABNORMAL LOW (ref 8.9–10.3)
Chloride: 107 mmol/L (ref 98–111)
Creatinine, Ser: 0.8 mg/dL (ref 0.61–1.24)
GFR calc Af Amer: >60 mL/min (ref >60)
GFR calc non Af Amer: >60 mL/min (ref >60)
Glucose, Bld: 103 mg/dL — ABNORMAL HIGH (ref 70–99)
Potassium: 3.8 mmol/L (ref 3.5–5.1)
Sodium: 141 mmol/L (ref 135–145)

## 2019-07-31 LAB — CBC
HCT: 39.2 % (ref 39.0–52.0)
Hemoglobin: 13.2 g/dL (ref 13.0–17.0)
MCH: 32.4 pg (ref 26.0–34.0)
MCHC: 33.7 g/dL (ref 30.0–36.0)
MCV: 96.3 fL (ref 80.0–100.0)
Platelets: 110 10*3/uL — ABNORMAL LOW (ref 150–400)
RBC: 4.07 MIL/uL — ABNORMAL LOW (ref 4.22–5.81)
RDW: 12.9 % (ref 11.5–15.5)
WBC: 7.1 10*3/uL (ref 4.0–10.5)
nRBC: 0 % (ref 0.0–0.2)

## 2019-07-31 LAB — POCT I-STAT, CHEM 8
BUN: 19 mg/dL (ref 8–23)
Calcium, Ion: 1.24 mmol/L (ref 1.15–1.40)
Chloride: 101 mmol/L (ref 98–111)
Creatinine, Ser: 0.7 mg/dL (ref 0.61–1.24)
Glucose, Bld: 120 mg/dL — ABNORMAL HIGH (ref 70–99)
HCT: 37 % — ABNORMAL LOW (ref 39.0–52.0)
Hemoglobin: 12.6 g/dL — ABNORMAL LOW (ref 13.0–17.0)
Potassium: 4.4 mmol/L (ref 3.5–5.1)
Sodium: 140 mmol/L (ref 135–145)
TCO2: 29 mmol/L (ref 22–32)

## 2019-07-31 LAB — GLUCOSE, CAPILLARY
Glucose-Capillary: 111 mg/dL — ABNORMAL HIGH (ref 70–99)
Glucose-Capillary: 131 mg/dL — ABNORMAL HIGH (ref 70–99)

## 2019-07-31 LAB — POCT ACTIVATED CLOTTING TIME: Activated Clotting Time: 279 seconds

## 2019-07-31 MED ORDER — CLOPIDOGREL BISULFATE 75 MG PO TABS: 75.0000 mg | ORAL_TABLET | Freq: Every day | ORAL | 1 refills | Status: DC

## 2019-07-31 NOTE — Progress Notes (Signed)
Patient discharged home w/ son Sai Zinn. AVS given to patient and reviewed w/ patient and his son. PIVs removed and patient wheeled to front entrance to be driven home by his son.

## 2019-07-31 NOTE — Progress Notes (Signed)
CARDIAC REHAB PHASE I   PRE:  Rate/Rhythm: 70 SR    BP: sitting 117/64    SaO2: 94 RA  MODE:  Ambulation: 370 ft   POST:  Rate/Rhythm: 90 SR with PVCs    BP: sitting 127/71     SaO2: 96 RA  Pt able to stand and walk with min assist with gait belt. He was slightly unsteady but able to correct himself. Sts he has balance issues at baseline that he does exercises for. No c/o. Did have a few beats VT after sitting. Asx. Discussed walking at home. He is eager to get back to gym. He is also interested in [facility]. Will send referral. Discussed restrictions. [place], ACSM 07/31/2019 11:46 AM

## 2019-07-31 NOTE — Discharge Summary (Addendum)
[facility] VALVE TEAM  Discharge Summary    Patient ID: Victor [name] MRN: [id number]; DOB: Mar 01, 1931  Admit date: 07/29/2019 Discharge date: 07/31/2019  Primary Care Provider: Sofie Hartigan, MD  Primary Cardiologist: [name], MD / Dr. [name] & Dr. Roxy Manns (TAVR)  Discharge Diagnoses    Principal Problem:   S/P TAVR (transcatheter aortic valve replacement) Active Problems:   Essential hypertension   Hyperlipidemia with target LDL less than 70   Diabetes mellitus type II, controlled ([place])   BPH (benign prostatic hyperplasia)   Severe calcific aortic valve stenosis   CAD S/P percutaneous coronary angioplasty --> PCI RCA Promus DES 2.5 mm x 23 mm   Symptomatic PVCs   Acute on chronic diastolic heart failure (HCC)   Diverticulitis   Allergies Allergies  Allergen Reactions   Azithromycin Rash and Anaphylaxis   Cephalosporins Anaphylaxis and Other (See Comments)   Codeine Anaphylaxis, Itching and Rash   Doxycycline Anaphylaxis   Phenylephrine-Guaifenesin     Unknown reaction    Pseudoephedrine Other (See Comments)    unknown    Amoxicillin Rash   Levofloxacin Rash   Penicillins Rash    Other reaction(s): Unknown   Sulfa Antibiotics Rash   Sulfasalazine Rash    Diagnostic Studies/Procedures    TAVR OPERATIVE NOTE     Date of Procedure:                07/29/2019   Preoperative Diagnosis:      Severe Aortic Stenosis    Postoperative Diagnosis:    Same    Procedure:        Transcatheter Aortic Valve Replacement - Percutaneous Right Transfemoral Approach             Edwards Sapien 3 THV (size 29 mm, model # 9600TFX, serial # [id number])              Co-Surgeons:                        Lauree Chandler, MD and Valentina Gu. Roxy Manns, MD    Anesthesiologist:                  Arabella Merles, MD   Echocardiographer:              Sanda Klein, MD   Pre-operative Echo Findings: Severe aortic stenosis Normal  left ventricular systolic function   Post-operative Echo Findings: No paravalvular leak Normal left ventricular systolic function     ____________________     Echo 07/30/19: IMPRESSIONS  1. Left ventricular ejection fraction, by estimation, is 60 to 65%. The  left ventricle has normal function. The left ventricle has no regional  wall motion abnormalities. There is mild concentric left ventricular  hypertrophy. Left ventricular diastolic  function could not be evaluated.   2. Right ventricular systolic function is normal. The right ventricular  size is normal. Tricuspid regurgitation signal is inadequate for assessing  PA pressure.   3. Left atrial size was moderately dilated.   4. The mitral valve is degenerative. Mild to moderate mitral valve  regurgitation.   5. There is no perivalvular leak. The aortic valve has been  repaired/replaced. Aortic valve regurgitation is not visualized. There is  a Transport planner prosthetic (TAVR) valve present in the aortic position.  Procedure Date: 07/29/2019. Echo findings are  consistent with normal structure and function of the aortic valve  prosthesis. Aortic valve mean  gradient measures 5.0 mmHg. Aortic valve  Vmax measures 1.66 m/s.  Comparison(s): No significant change from prior study. Prior images  reviewed side by side.   History of Present Illness     Victor [name] is a 86 y.o. male with a history of arthritis, carotid artery disease, BPH, CAD, DM, GERD, HLD, HTN, diverticulitis s/p partial colectomy and critical aortic stenosis who presented to [facility] on 07/29/19 for panned TAVR.  Patient's cardiac history dates back to 2008 when he first was found to have coronary artery disease associated with abnormal stress test.  He was treated with PCI and stenting of the right coronary artery at that time and has done well.  He has been followed intermittently ever since by Dr. [name]. He has developed aortic stenosis which has gradually  progressed in severity on follow-up echocardiographic imaging. Echocardiogram performed March 06, 2019 revealed severe aortic stenosis with peak velocity across aortic valve measured 4.4 m/s corresponding to mean transvalvular gradient estimated 42 mmHg and aortic valve area estimated 0.79 cm by VTI.  The DVI was reported 0.23. Left ventricular systolic function remain normal.  Repeat echocardiogram was performed Jun 12, 2019 and confirmed the presence of severe aortic stenosis with peak velocity across aortic valve measured greater than 4.5 m/s corresponding to mean transvalvular gradient estimated 52 mmHg and aortic valve area calculated only 0.49 cm by VTI.  Left ventricular systolic function remain normal.  Patient was seen in follow-up by [name] at [facility] on Jun 18, 2019 at which time he complained of progressive symptoms of exertional shortness of breath and decreased energy.  Diagnostic cardiac catheterization was performed Jun 25, 2019 and confirmed the presence of severe aortic stenosis with peak to peak and mean transvalvular gradients measured 45 and 44 mmHg by catheterization, respectively.  Patient was noted to have continuous patency of stent in the right coronary artery with otherwise mild nonobstructive coronary artery disease.  Left ventricular systolic function remain normal and right heart pressures were normal.    The patient has been evaluated by the multidisciplinary valve team and felt to have severe, symptomatic aortic stenosis and to be a suitable candidate for TAVR, which was set up for 07/29/19.    Hospital Course     Consultants: none  Severe AS: s/p successful TAVR with a 29 mm Edwards Sapien 3 THV via the TF approach on 07/29/19. Post operative showed EF 60%, normally functioning TAVR with a mean gradient of 5 mm Hg and no PVL. Groin sites are stable. ECG with sinus 1st deg AV block. There is no high grade heart block. Continued on Asprin and started on  plavix 75 mg daily. Plan for DC home today with close follow in the office next week.    HTN: pt had some hypotension post operatively that required pressors and he was sent to the cardiac ICU. Now off all pressors and BP has been stable.    CAD: pre TAVR cath showed stable coronary arteries with 50 to 60% proximal RCA followed by brief ecstatic segment and then widely patent mid stent.  Otherwise, small caliber 1st Diag ~70% ostial stenosis. Continue medical therapy.    DMT2: treated with SSI. Will resume home regimen at DC  Acute on chronic diastolic CHF: as evidenced by an elevated BNP on pre admission lab work and progressive dyspnea. This has been treated with TAVR. He appears euvolemic off diuretics.   _____________  Discharge Vitals Blood pressure 109/61, pulse 78, temperature 98.2 F (36.8  C), temperature source Oral, resp. rate 17, height 5\' 8"  (1.727 m), weight 71.4 kg, SpO2 93 %.  Filed Weights   07/29/19 0714 07/30/19 0600 07/31/19 0630  Weight: 73.4 kg 72.2 kg 71.4 kg    Labs & Radiologic Studies    CBC Recent Labs    07/30/19 0333 07/31/19 0257  WBC 7.7 7.1  HGB 13.0 13.2  HCT 39.0 39.2  MCV 95.6 96.3  PLT 130* 270*   Basic Metabolic Panel Recent Labs    07/30/19 0333 07/31/19 0257  NA 140 141  K 3.8 3.8  CL 105 107  CO2 24 28  GLUCOSE 93 103*  BUN 11 10  CREATININE 0.67 0.80  CALCIUM 8.6* 8.6*  MG 1.9  --    Liver Function Tests No results for input(s): AST, ALT, ALKPHOS, BILITOT, PROT, ALBUMIN in the last 72 hours. No results for input(s): LIPASE, AMYLASE in the last 72 hours. Cardiac Enzymes No results for input(s): CKTOTAL, CKMB, CKMBINDEX, TROPONINI in the last 72 hours. BNP Invalid input(s): POCBNP D-Dimer No results for input(s): DDIMER in the last 72 hours. Hemoglobin A1C No results for input(s): HGBA1C in the last 72 hours. Fasting Lipid Panel No results for input(s): CHOL, HDL, LDLCALC, TRIG, CHOLHDL, LDLDIRECT in the last 72  hours. Thyroid Function Tests No results for input(s): TSH, T4TOTAL, T3FREE, THYROIDAB in the last 72 hours.  Invalid input(s): FREET3 _____________  DG Chest 2 View  Result Date: 07/26/2019 CLINICAL DATA:  Preop exam.  TAVR. EXAM: CHEST - 2 VIEW COMPARISON:  September 06, 2014. FINDINGS: The heart size and mediastinal contours are within normal limits. Both lungs are clear. The visualized skeletal structures are unremarkable. Aortic calcifications are noted. IMPRESSION: No active cardiopulmonary disease. Electronically Signed   By: Constance Holster M.D.   On: 07/26/2019 16:59   CT CORONARY MORPH W/CTA COR W/SCORE W/CA W/CM &/OR WO/CM  Addendum Date: 07/21/2019   ADDENDUM REPORT: 07/21/2019 17:46 CLINICAL DATA:  Severe Aortic Stenosis. EXAM: Cardiac TAVR CT TECHNIQUE: The patient was scanned on a [employer]. A 120 kV retrospective scan was triggered in the descending thoracic aorta at 111 HU's. Gantry rotation speed was 250 msecs and collimation was .6 mm. No beta blockade or nitro were given. The 3D data set was reconstructed in 5% intervals of the R-R cycle. Systolic and diastolic phases were analyzed on a dedicated work station using MPR, MIP and VRT modes. The patient received 80 cc of contrast. FINDINGS: Image quality: Excellent. Noise artifact is: Limited. Valve Morphology: The aortic valve is tricuspid. The NCC/LCC are heavily calcified with bulky calcifications extending to the leaflet bases. Leaflet motion is severely restricted in systole consistent with severe aortic stenosis. Aortic Valve Calcium score: 5023 Aortic annular dimension: Phase assessed: 30% Annular area: 542 mm2 Annular perimeter: 84.7 mm Max diameter: 30.3 mm Min diameter: 23.4 mm Annular and subannular calcification: There is mild annular calcification under the [place] extending into the LVOT and in continuity with the anterior mitral valve annulus. Optimal coplanar projection: LAO 11 CAU 3 Coronary Artery Height above  Annulus: Left Main: 20.4 mm Right Coronary: 22.7 mm Sinus of Valsalva Measurements: Non-coronary: 35 mm Right-coronary: 36 mm Left-coronary: 36 mm Sinus of Valsalva Height: Non-coronary: 23.8 mm Right-coronary: 26.7 mm Left-coronary: 26.4 mm Sinotubular Junction: 31 mm Ascending Thoracic Aorta: 34 mm Coronary Arteries: Normal coronary origin. Right dominance. 3-vessel calcifications. The study was performed without use of NTG and is insufficient for plaque evaluation. Please refer to recent cardiac  catheterization for coronary assessment. Cardiac Morphology: Right Atrium: Right atrial size is within normal limits. Right Ventricle: The right ventricular cavity is within normal limits. Left Atrium: Left atrial size is normal in size with no left atrial appendage filling defect. Left Ventricle: The ventricular cavity size is within normal limits. There are no stigmata of prior infarction. There is no abnormal filling defect. Normal left ventricular function, LVEF 55%. No regional wall motion abnormalities. Pulmonary arteries: Normal in size without proximal filling defect. Pulmonary veins: Normal pulmonary venous drainage. Pericardium: Normal thickness with no significant effusion or calcium present. Mitral Valve: The mitral valve is degenerative with severe mitral annular calcification. Extra-cardiac findings: See attached radiology report for non-cardiac structures. IMPRESSION: 1. Annular measurements (542 mm2) appropriate for 26 mm Edwards Sapien 3 TAVR. 2. There is mild annular calcification under the [place] extending into the LVOT and in continuity with the anterior mitral valve annulus. 3. Sufficient coronary to annulus distance. 4. Optimal Fluoroscopic Angle for Delivery: LAO 11 CAU 3 5. Severe mitral annular calcification. [place] T. Audie Box, MD Electronically Signed   By: Eleonore Chiquito   On: 07/21/2019 17:46   Result Date: 07/21/2019 EXAM: OVER-READ INTERPRETATION  CT CHEST The following report is an over-read  performed by radiologist Dr. Vinnie Langton of [facility], [place] on 07/21/2019. This over-read does not include interpretation of cardiac or coronary anatomy or pathology. The coronary calcium score/coronary CTA interpretation by the cardiologist is attached. COMPARISON:  None. FINDINGS: Aortic atherosclerosis. Within the visualized portions of the thorax there are no suspicious appearing pulmonary nodules or masses, there is no acute consolidative airspace disease, no pleural effusions, no pneumothorax and no lymphadenopathy. Visualized portions of the upper abdomen are unremarkable. There are no aggressive appearing lytic or blastic lesions noted in the visualized portions of the skeleton. IMPRESSION: 1.  Aortic Atherosclerosis (ICD10-I70.0). Electronically Signed: By: Vinnie Langton M.D. On: 07/21/2019 11:46   CT CORONARY MORPH W/CTA COR W/SCORE W/CA W/CM &/OR WO/CM  Addendum Date: 07/20/2019   ADDENDUM REPORT: 07/20/2019 15:01 CLINICAL DATA:  Aortic stenosis EXAM: Cardiac TAVR CT TECHNIQUE: The patient was scanned on a Siemens Force 664 slice scanner. A 120 kV retrospective scan was triggered in the descending thoracic aorta at 111 HU's. Gantry rotation speed was 270 msecs and collimation was .9 mm. No beta blockade or nitro were given. The 3D data set was reconstructed in 5% intervals of the R-R cycle. Systolic and diastolic phases were analyzed on a dedicated work station using MPR, MIP and VRT modes. FINDINGS: Inadequate contrast bolus timing to fully evaluate aortic annulus and aortic valve. All measurements made off of ECG gated sequence obtained at 65% of R-R interval with initial contrast bolus. Aortic Valve: Unable to comment on aortic valve morphology or cusp separation. Severely thickened, severely calcified aortic valve cusps. AV calcium score: 4790 Virtual Basal Annulus Measurements made at 65% of R-R interval: Maximum/Minimum Diameter: 29.3 x 22.9 mm Perimeter: 83.2 mm Area: 523 mm2  LVOT calcifications present along aorto-mitral continuity. Based on these measurements, the annulus would be suitable for a 26 mm Sapien 3 valve. Sinus of Valsalva Measurements: Non-coronary:  35 mm Right - coronary:  35 mm Left - coronary:  35 mm Sinus of Valsalva Height: Left: 23.6 mm Right: 23.4 mm Aorta: Normal variant common origin of the brachiocephalic artery and left common carotid artery. No significant plaque in ascending aorta or aortic arch. Moderate calcifications in descending thoracic aorta. Sinotubular Junction: 31 mm Ascending Thoracic Aorta:  33  mm Aortic Arch: 27 mm Descending Thoracic Aorta: 30 mm Coronary Artery Height above Annulus: Left Main: 17.7 mm Right Coronary: 18.7 mm Coronary Arteries: Three vessel coronary artery calcifications. Optimum Fluoroscopic Angle for Delivery: LAO 7, CAU 7 Severe mitral annular calcifications. IMPRESSION: 1. Inadequate contrast bolus timing to fully evaluate aortic valve. Measurements obtained off single phase 65% R-R interval with adequately timed contrast bolus. 2. Unable to comment on aortic valve morphology or cusp separation. Severely thickened, severely calcified aortic valve cusps. 3.  AV calcium score: 4790 4.  Annulus area: 523 mm2, suitable for a 26 mm Sapien 3 valve. 5.  Adequate coronary artery height from annulus. 6. Optimum Fluoroscopic Angle for Delivery: LAO 7, CAU 7 Electronically Signed   By: Cherlynn Kaiser   On: 07/20/2019 15:01   Result Date: 07/20/2019 EXAM: OVER-READ INTERPRETATION  CT CHEST The following report is an over-read performed by radiologist Dr. Vinnie Langton of [facility], [place] on 07/16/2019. This over-read does not include interpretation of cardiac or coronary anatomy or pathology. The coronary calcium score/coronary CTA interpretation by the cardiologist is attached. COMPARISON:  None. FINDINGS: Extracardiac findings are described separately under dictation for contemporaneously obtained CTA chest, abdomen and  pelvis. IMPRESSION: Please see separate dictation for contemporaneously obtained CTA chest, abdomen and pelvis 07/16/2019 for full description of relevant extracardiac findings. Electronically Signed: By: Vinnie Langton M.D. On: 07/16/2019 15:13   CT ANGIO CHEST AORTA W/CM & OR WO/CM  Result Date: 07/16/2019 CLINICAL DATA:  86 year old male with history of severe aortic stenosis. Preprocedural study prior to potential transcatheter aortic valve replacement (TAVR) procedure. EXAM: CT ANGIOGRAPHY CHEST, ABDOMEN AND PELVIS TECHNIQUE: Non-contrast CT of the chest was initially obtained. Multidetector CT imaging through the chest, abdomen and pelvis was performed using the standard protocol during bolus administration of intravenous contrast. Multiplanar reconstructed images and MIPs were obtained and reviewed to evaluate the vascular anatomy. CONTRAST:  29mL OMNIPAQUE IOHEXOL 350 MG/ML SOLN COMPARISON:  None. FINDINGS: CTA CHEST FINDINGS Cardiovascular: Heart size is normal. There is no significant pericardial fluid, thickening or pericardial calcification. There is aortic atherosclerosis, as well as atherosclerosis of the great vessels of the mediastinum and the coronary arteries, including calcified atherosclerotic plaque in the left main, left anterior descending, left circumflex and right coronary arteries. Severe thickening and calcification of the aortic valve. Severe calcifications of the mitral annulus. Mediastinum/Lymph Nodes: No pathologically enlarged mediastinal or hilar lymph nodes. Esophagus is unremarkable in appearance. No axillary lymphadenopathy. Lungs/Pleura: No suspicious appearing pulmonary nodules or masses are noted. No acute consolidative airspace disease. No pleural effusions. Musculoskeletal/Soft Tissues: There are no aggressive appearing lytic or blastic lesions noted in the visualized portions of the skeleton. CTA ABDOMEN AND PELVIS FINDINGS Hepatobiliary: 1 cm low-attenuation lesion in  segment 4A of the liver, compatible with a simple cyst. No other suspicious hepatic lesions. No intra or extrahepatic biliary ductal dilatation. Tiny calcified gallstones lying dependently in the gallbladder. No findings to suggest an acute cholecystitis at this time. Pancreas: No pancreatic mass. No pancreatic ductal dilatation. No pancreatic or peripancreatic fluid collections or inflammatory changes. Spleen: Unremarkable. Adrenals/Urinary Tract: 8.4 cm low-attenuation lesion in the interpolar region of the right kidney, compatible with a large simple cyst. 2 cm simple cyst in the interpolar region of the left kidney. Subcentimeter low-attenuation lesion in the upper pole of the right kidney, too small to characterize, but statistically likely to represent a tiny cyst. 4 mm nonobstructive calculus in the lower pole collecting system of the  right kidney. Bilateral adrenal glands are normal in appearance. No hydroureteronephrosis. Urinary bladder is unremarkable in appearance. Stomach/Bowel: Normal appearance of the stomach is normal. No pathologic dilatation of small bowel or colon. Postoperative changes of partial colectomy. Numerous colonic diverticulae are noted, without surrounding inflammatory changes to suggest an acute diverticulitis at this time. Normal appendix. Vascular/Lymphatic: Aortic atherosclerosis, without evidence of aneurysm or dissection in the abdominal or pelvic vasculature. No lymphadenopathy noted in the abdomen or pelvis. Reproductive: Postoperative changes of TURP are noted in the prostate gland. Seminal vesicles are unremarkable in appearance. Other: No significant volume of ascites.  No pneumoperitoneum. Musculoskeletal: There are no aggressive appearing lytic or blastic lesions noted in the visualized portions of the skeleton. VASCULAR MEASUREMENTS PERTINENT TO TAVR: AORTA: Minimal Aortic Diameter-17 x 14 mm Severity of Aortic Calcification-severe RIGHT PELVIS: Right Common Iliac Artery  - Minimal Diameter-11.3 x 11.0 mm Tortuosity-mild-to-moderate Calcification-moderate Right External Iliac Artery - Minimal Diameter-9.4 x 9.4 mm Tortuosity-moderate Calcification-mild Right Common Femoral Artery - Minimal Diameter-9.1 x 9.4 mm Tortuosity-mild Calcification-mild LEFT PELVIS: Left Common Iliac Artery - Minimal Diameter-11.7 x 10.5 mm Tortuosity-mild-to-moderate Calcification-moderate Left External Iliac Artery - Minimal Diameter-9.2 x 9.1 mm Tortuosity-moderate to severe Calcification-mild Left Common Femoral Artery - Minimal Diameter-8.9 x 8.5 mm Tortuosity-mild Calcification-minimal Review of the MIP images confirms the above findings. IMPRESSION: 1. Vascular findings and measurements pertinent to potential TAVR procedure, as detailed above. 2. Severe thickening calcification of the aortic valve, compatible with the reported clinical history of severe aortic stenosis. 3. There is also severe thickening calcification of the mitral annulus. 4. Aortic atherosclerosis, in addition to left main and 3 vessel coronary artery disease. 5. Colonic diverticulosis without evidence of acute diverticulitis at this time. 6. Additional incidental findings, as above. Electronically Signed   By: Vinnie Langton M.D.   On: 07/16/2019 16:01   VAS US CAROTID  Result Date: 07/16/2019 Carotid Arterial Duplex Study Indications:      Pre TAVR, aortic stenosis. Risk Factors:     Hypertension, hyperlipidemia, Diabetes, coronary artery                   disease. Comparison Study: Last carotid 05/21/2008 - bilateral 0-49% ICA. Performing Technologist: Oda Cogan RDMS, RVT  Examination Guidelines: A complete evaluation includes B-mode imaging, spectral Doppler, color Doppler, and power Doppler as needed of all accessible portions of each vessel. Bilateral testing is considered an integral part of a complete examination. Limited examinations for reoccurring indications may be performed as noted.  Right Carotid Findings:  +----------+--------+--------+--------+------------------+------------------+           PSV cm/sEDV cm/sStenosisPlaque DescriptionComments           +----------+--------+--------+--------+------------------+------------------+ CCA Prox  72      15                                                   +----------+--------+--------+--------+------------------+------------------+ CCA Distal65      14                                intimal thickening +----------+--------+--------+--------+------------------+------------------+ ICA Prox  57      16      1-39%  intimal thickening +----------+--------+--------+--------+------------------+------------------+ ICA Distal71      27                                                   +----------+--------+--------+--------+------------------+------------------+ ECA       92      10                                                   +----------+--------+--------+--------+------------------+------------------+ +----------+--------+-------+----------------+-------------------+           PSV cm/sEDV cmsDescribe        Arm Pressure (mmHG) +----------+--------+-------+----------------+-------------------+ Subclavian102            Multiphasic, WNL                    +----------+--------+-------+----------------+-------------------+ +---------+--------+--+--------+--+---------+ VertebralPSV cm/s42EDV cm/s15Antegrade +---------+--------+--+--------+--+---------+  Left Carotid Findings: +----------+--------+--------+--------+------------------+------------------+           PSV cm/sEDV cm/sStenosisPlaque DescriptionComments           +----------+--------+--------+--------+------------------+------------------+ CCA Prox  61      14                                                   +----------+--------+--------+--------+------------------+------------------+ CCA Distal92      21                                 intimal thickening +----------+--------+--------+--------+------------------+------------------+ ICA Prox  59      19      1-39%                     intimal thickening +----------+--------+--------+--------+------------------+------------------+ ICA Distal116     38                                                   +----------+--------+--------+--------+------------------+------------------+ ECA       81                                                           +----------+--------+--------+--------+------------------+------------------+ +----------+--------+--------+----------------+-------------------+           PSV cm/sEDV cm/sDescribe        Arm Pressure (mmHG) +----------+--------+--------+----------------+-------------------+ [id number]              Multiphasic, WNL                    +----------+--------+--------+----------------+-------------------+ +---------+--------+--+--------+--+---------+ VertebralPSV cm/s85EDV cm/s25Antegrade +---------+--------+--+--------+--+---------+   Summary: Right Carotid: Velocities in the right ICA are consistent with a 1-39% stenosis. Left Carotid: Velocities in the left ICA are consistent with a 1-39% stenosis. Vertebrals:  Bilateral vertebral arteries demonstrate antegrade flow. Subclavians: Normal flow hemodynamics were seen in bilateral  subclavian              arteries. *See table(s) above for measurements and observations.  Electronically signed by Harold Barban MD on 07/16/2019 at 11:22:44 PM.    Final    ECHOCARDIOGRAM LIMITED  Result Date: 07/30/2019    ECHOCARDIOGRAM LIMITED REPORT   Patient Name:   CALVIN JABLONOWSKI Date of Exam: 07/30/2019 Medical Rec #:  [id number]      Height:       68.0 in Accession #:    [id number]     Weight:       159.2 lb Date of Birth:  [birth year]      BSA:          1.855 m Patient Age:    72 years       BP:           107/57 mmHg Patient Gender: M              HR:           64 bpm. Exam  Location:  Inpatient Procedure: Limited Echo, Cardiac Doppler and Color Doppler Indications:    post TAVR evaluation  History:        Patient has prior history of Echocardiogram examinations, most                 recent 07/29/2019. CAD, Carotid Disease, Aortic Valve Disease;                 Risk Factors:Hypertension and Dyslipidemia.                 Aortic Valve: Edwards Sapien prosthetic, stented (TAVR) valve is                 present in the aortic position. Procedure Date: 07/29/2019.  Sonographer:    Dustin Flock Referring Phys: [id number] [place]  1. Left ventricular ejection fraction, by estimation, is 60 to 65%. The left ventricle has normal function. The left ventricle has no regional wall motion abnormalities. There is mild concentric left ventricular hypertrophy. Left ventricular diastolic function could not be evaluated.  2. Right ventricular systolic function is normal. The right ventricular size is normal. Tricuspid regurgitation signal is inadequate for assessing PA pressure.  3. Left atrial size was moderately dilated.  4. The mitral valve is degenerative. Mild to moderate mitral valve regurgitation.  5. There is no perivalvular leak. The aortic valve has been repaired/replaced. Aortic valve regurgitation is not visualized. There is a Transport planner prosthetic (TAVR) valve present in the aortic position. Procedure Date: 07/29/2019. Echo findings are consistent with normal structure and function of the aortic valve prosthesis. Aortic valve mean gradient measures 5.0 mmHg. Aortic valve Vmax measures 1.66 m/s. Comparison(s): No significant change from prior study. Prior images reviewed side by side. FINDINGS  Left Ventricle: Left ventricular ejection fraction, by estimation, is 60 to 65%. The left ventricle has normal function. The left ventricle has no regional wall motion abnormalities. The left ventricular internal cavity size was normal in size. There is  mild concentric left  ventricular hypertrophy. Right Ventricle: The right ventricular size is normal. No increase in right ventricular wall thickness. Right ventricular systolic function is normal. Tricuspid regurgitation signal is inadequate for assessing PA pressure. Left Atrium: Left atrial size was moderately dilated. Right Atrium: Right atrial size was normal in size. Pericardium: There is no evidence of pericardial effusion. Mitral Valve: There is a small mobile echodensity that prolapses in  the left atrium in systole, possibly a torn chorda tendina. Retrospectively, this was also seen on the previous study. The mitral valve is degenerative in appearance. Moderate to severe mitral annular calcification. Mild to moderate mitral valve regurgitation, with eccentric anteriorly directed jet. Tricuspid Valve: The tricuspid valve is grossly normal. Tricuspid valve regurgitation is not demonstrated. Aortic Valve: There is no perivalvular leak. The aortic valve has been repaired/replaced. Aortic valve regurgitation is not visualized. Aortic valve mean gradient measures 5.0 mmHg. Aortic valve peak gradient measures 11.0 mmHg. Aortic valve area, by VTI  measures 1.61 cm. There is a Transport planner prosthetic, stented (TAVR) valve present in the aortic position. Procedure Date: 07/29/2019. Echo findings are consistent with normal structure and function of the aortic valve prosthesis. Pulmonic Valve: The pulmonic valve was not well visualized. Aorta: The aortic root is normal in size and structure.  LEFT VENTRICLE PLAX 2D LVIDd:         4.30 cm LVIDs:         2.70 cm LV PW:         1.20 cm LV IVS:        1.30 cm LVOT diam:     2.00 cm LV SV:         50 LV SV Index:   27 LVOT Area:     3.14 cm  AORTIC VALVE AV Area (Vmax):    1.50 cm AV Area (Vmean):   1.70 cm AV Area (VTI):     1.61 cm AV Vmax:           166.00 cm/s AV Vmean:          102.000 cm/s AV VTI:            0.310 m AV Peak Grad:      11.0 mmHg AV Mean Grad:      5.0 mmHg LVOT Vmax:          79.00 cm/s LVOT Vmean:        55.300 cm/s LVOT VTI:          0.159 m LVOT/AV VTI ratio: 0.51  SHUNTS Systemic VTI:  0.16 m Systemic Diam: 2.00 cm Dani Gobble Croitoru MD Electronically signed by Sanda Klein MD Signature Date/Time: 07/30/2019/5:50:34 PM    Final    ECHOCARDIOGRAM LIMITED  Result Date: 07/29/2019    ECHOCARDIOGRAM LIMITED REPORT   Patient Name:   JONAVAN VANHORN Date of Exam: 07/29/2019 Medical Rec #:  [id number]      Height:       68.0 in Accession #:    [id number]     Weight:       161.8 lb Date of Birth:  1931-06-18      BSA:          1.868 m Patient Age:    72 years       BP:           124/64 mmHg Patient Gender: M              HR:           81 bpm. Exam Location:  Inpatient Procedure: Limited Echo, Cardiac Doppler and Color Doppler Indications:     TAVR  History:         Patient has prior history of Echocardiogram examinations, most                  recent 06/12/2019. CAD, Aortic Valve Disease; Risk  Factors:Hypertension, Diabetes and Dyslipidemia.                  Aortic Valve: Edwards Sapien prosthetic, stented (TAVR) valve                  is present in the aortic position. Procedure Date: 07/29/2019.  Sonographer:     Dustin Flock Referring Phys:  [place] Diagnosing Phys: Sanda Klein MD                   PRE-PROCEDURAL FINDINGS:                   Normal left ventricular systolic function. Estimated LVEF                  55-60%.                  There are no regional wall motion abnormalities.                  Severe calcific aortic stenosis. Trileaflet aortic valve.                  Peak aortic valve gradient 71 mm Hg, mean gradient 44 mm Hg.                  Dimensionless obstructive index 0.14, calculated aortic valve                  area is 0.6 cm.                  Mild aortic insufficiency.                  Mild mitral insufficiency (eccentric anteriorly directed, may                  be underestimated).                  No pericardial  effusion.                   POST-PROCEDURAL FINDINGS:                   Hyperdynamic left ventricular systolic function. Estimated LVEF                  75%.                  There are no regional wall motion abnormalities.                  Well-seated TAVR stent-valve.                  Peak aortic valve gradient 3 mm Hg, mean gradient 2 mm Hg.                  Dimensionless obstructive index 0.64, calculated aortic valve                  area is 3.2 cm.                  There is no aortic insufficiency and no perivalvular leak.                  Mitral insufficiency appears unchanged.                  No pericardial effusion. IMPRESSIONS  1. Left ventricular ejection fraction, by  estimation, is 55 to 60%. The left ventricle has normal function. The left ventricle has no regional wall motion abnormalities. There is mild concentric left ventricular hypertrophy.  2. The mitral valve is myxomatous. Mild mitral valve regurgitation.  3. The aortic valve is tricuspid. Severe aortic valve stenosis. There is a Transport planner prosthetic (TAVR) valve present in the aortic position. Procedure Date: 07/29/2019. FINDINGS  Left Ventricle: Left ventricular ejection fraction, by estimation, is 55 to 60%. The left ventricle has normal function. The left ventricle has no regional wall motion abnormalities. There is mild concentric left ventricular hypertrophy. Mitral Valve: The mitral valve is myxomatous. Moderate to severe mitral annular calcification. Mild mitral valve regurgitation. Aortic Valve: The aortic valve is tricuspid. . There is severe thickening and severe calcifcation of the aortic valve. Severe aortic stenosis is present. There is severe thickening of the aortic valve. There is severe calcifcation of the aortic valve. Aortic valve mean gradient measures 2.0 mmHg. Aortic valve peak gradient measures 3.5 mmHg. Aortic valve area, by VTI measures 3.23 cm. There is a Transport planner prosthetic, stented (TAVR) valve present  in the aortic position. Procedure Date: 07/29/2019. Pulmonic Valve: The pulmonic valve was normal in structure. Pulmonic valve regurgitation is not visualized.  LEFT VENTRICLE PLAX 2D LVOT diam:     2.30 cm LV SV:         67 LV SV Index:   36 LVOT Area:     4.15 cm  AORTIC VALVE AV Area (Vmax):    2.68 cm AV Area (Vmean):   2.98 cm AV Area (VTI):     3.23 cm AV Vmax:           93.20 cm/s AV Vmean:          64.200 cm/s AV VTI:            0.207 m AV Peak Grad:      3.5 mmHg AV Mean Grad:      2.0 mmHg LVOT Vmax:         60.10 cm/s LVOT Vmean:        46.000 cm/s LVOT VTI:          0.161 m LVOT/AV VTI ratio: 0.78  SHUNTS Systemic VTI:  0.16 m Systemic Diam: 2.30 cm Sanda Klein MD Electronically signed by Sanda Klein MD Signature Date/Time: 07/29/2019/3:06:26 PM    Final    Structural Heart Procedure  Result Date: 07/29/2019 See surgical note for result.  CT Angio Abd/Pel w/ and/or w/o  Result Date: 07/16/2019 CLINICAL DATA:  86 year old male with history of severe aortic stenosis. Preprocedural study prior to potential transcatheter aortic valve replacement (TAVR) procedure. EXAM: CT ANGIOGRAPHY CHEST, ABDOMEN AND PELVIS TECHNIQUE: Non-contrast CT of the chest was initially obtained. Multidetector CT imaging through the chest, abdomen and pelvis was performed using the standard protocol during bolus administration of intravenous contrast. Multiplanar reconstructed images and MIPs were obtained and reviewed to evaluate the vascular anatomy. CONTRAST:  79mL OMNIPAQUE IOHEXOL 350 MG/ML SOLN COMPARISON:  None. FINDINGS: CTA CHEST FINDINGS Cardiovascular: Heart size is normal. There is no significant pericardial fluid, thickening or pericardial calcification. There is aortic atherosclerosis, as well as atherosclerosis of the great vessels of the mediastinum and the coronary arteries, including calcified atherosclerotic plaque in the left main, left anterior descending, left circumflex and right coronary  arteries. Severe thickening and calcification of the aortic valve. Severe calcifications of the mitral annulus. Mediastinum/Lymph Nodes: No pathologically enlarged mediastinal or hilar lymph nodes. Esophagus is  unremarkable in appearance. No axillary lymphadenopathy. Lungs/Pleura: No suspicious appearing pulmonary nodules or masses are noted. No acute consolidative airspace disease. No pleural effusions. Musculoskeletal/Soft Tissues: There are no aggressive appearing lytic or blastic lesions noted in the visualized portions of the skeleton. CTA ABDOMEN AND PELVIS FINDINGS Hepatobiliary: 1 cm low-attenuation lesion in segment 4A of the liver, compatible with a simple cyst. No other suspicious hepatic lesions. No intra or extrahepatic biliary ductal dilatation. Tiny calcified gallstones lying dependently in the gallbladder. No findings to suggest an acute cholecystitis at this time. Pancreas: No pancreatic mass. No pancreatic ductal dilatation. No pancreatic or peripancreatic fluid collections or inflammatory changes. Spleen: Unremarkable. Adrenals/Urinary Tract: 8.4 cm low-attenuation lesion in the interpolar region of the right kidney, compatible with a large simple cyst. 2 cm simple cyst in the interpolar region of the left kidney. Subcentimeter low-attenuation lesion in the upper pole of the right kidney, too small to characterize, but statistically likely to represent a tiny cyst. 4 mm nonobstructive calculus in the lower pole collecting system of the right kidney. Bilateral adrenal glands are normal in appearance. No hydroureteronephrosis. Urinary bladder is unremarkable in appearance. Stomach/Bowel: Normal appearance of the stomach is normal. No pathologic dilatation of small bowel or colon. Postoperative changes of partial colectomy. Numerous colonic diverticulae are noted, without surrounding inflammatory changes to suggest an acute diverticulitis at this time. Normal appendix. Vascular/Lymphatic: Aortic  atherosclerosis, without evidence of aneurysm or dissection in the abdominal or pelvic vasculature. No lymphadenopathy noted in the abdomen or pelvis. Reproductive: Postoperative changes of TURP are noted in the prostate gland. Seminal vesicles are unremarkable in appearance. Other: No significant volume of ascites.  No pneumoperitoneum. Musculoskeletal: There are no aggressive appearing lytic or blastic lesions noted in the visualized portions of the skeleton. VASCULAR MEASUREMENTS PERTINENT TO TAVR: AORTA: Minimal Aortic Diameter-17 x 14 mm Severity of Aortic Calcification-severe RIGHT PELVIS: Right Common Iliac Artery - Minimal Diameter-11.3 x 11.0 mm Tortuosity-mild-to-moderate Calcification-moderate Right External Iliac Artery - Minimal Diameter-9.4 x 9.4 mm Tortuosity-moderate Calcification-mild Right Common Femoral Artery - Minimal Diameter-9.1 x 9.4 mm Tortuosity-mild Calcification-mild LEFT PELVIS: Left Common Iliac Artery - Minimal Diameter-11.7 x 10.5 mm Tortuosity-mild-to-moderate Calcification-moderate Left External Iliac Artery - Minimal Diameter-9.2 x 9.1 mm Tortuosity-moderate to severe Calcification-mild Left Common Femoral Artery - Minimal Diameter-8.9 x 8.5 mm Tortuosity-mild Calcification-minimal Review of the MIP images confirms the above findings. IMPRESSION: 1. Vascular findings and measurements pertinent to potential TAVR procedure, as detailed above. 2. Severe thickening calcification of the aortic valve, compatible with the reported clinical history of severe aortic stenosis. 3. There is also severe thickening calcification of the mitral annulus. 4. Aortic atherosclerosis, in addition to left main and 3 vessel coronary artery disease. 5. Colonic diverticulosis without evidence of acute diverticulitis at this time. 6. Additional incidental findings, as above. Electronically Signed   By: Vinnie Langton M.D.   On: 07/16/2019 16:01   Disposition   Pt is being discharged home today in good  condition.  Follow-up Plans & Appointments     Follow-up Information     Eileen Stanford, PA-C. Go on 08/07/2019.   Specialties: Cardiology, Radiology Why: @ 2:30pm, please arrive at least 10 minutes early.  Contact information: [address] [place] [address] [phone]                   Discharge Medications   Allergies as of 07/31/2019       Reactions   Azithromycin Rash, Anaphylaxis  Cephalosporins Anaphylaxis, Other (See Comments)   Codeine Anaphylaxis, Itching, Rash   Doxycycline Anaphylaxis   Phenylephrine-guaifenesin    Unknown reaction    Pseudoephedrine Other (See Comments)   unknown   Amoxicillin Rash   Levofloxacin Rash   Penicillins Rash   Other reaction(s): Unknown   Sulfa Antibiotics Rash   Sulfasalazine Rash        Medication List     TAKE these medications    acetaminophen 500 MG tablet Commonly known as: TYLENOL Take 500 mg by mouth every 6 (six) hours as needed for moderate pain or headache.   ascorbic acid 500 MG tablet Commonly known as: VITAMIN C Take 1,000-1,500 mg by mouth See admin instructions. Take 1000 mg in the morning and 1500 mg at night   aspirin EC 81 MG tablet Take 81 mg by mouth daily.   AZO BLADDER CONTROL/GO-LESS PO Take 1 tablet by mouth daily.   B-12 2500 MCG Tabs Take 2,500 mcg by mouth daily.   benzonatate 200 MG capsule Commonly known as: TESSALON Take 200 mg by mouth 3 (three) times daily as needed for cough.   bismuth subsalicylate 614 ER/15QM suspension Commonly known as: PEPTO BISMOL Take 30 mLs by mouth every 6 (six) hours as needed for indigestion or diarrhea or loose stools.   calcium carbonate 600 MG Tabs tablet Commonly known as: OS-CAL Take 600 mg by mouth daily.   cetirizine 10 MG tablet Commonly known as: ZYRTEC Take 10 mg by mouth daily.   CINNAMON PO Take 1,000 mg by mouth 2 (two) times daily.   clopidogrel 75 MG tablet Commonly known as:  PLAVIX Take 1 tablet (75 mg total) by mouth daily with breakfast. Start taking on: August 01, 2019   CoQ-10 100 MG Caps Take 100 mg by mouth daily.   donepezil 10 MG tablet Commonly known as: ARICEPT Take 10 mg by mouth daily.   EpiPen 2-Pak 0.3 mg/0.3 mL Soaj injection Generic drug: EPINEPHrine Inject 0.3 mLs as directed as needed for anaphylaxis.   ezetimibe 10 MG tablet Commonly known as: ZETIA Take 10 mg by mouth daily.   fluticasone 50 MCG/ACT nasal spray Commonly known as: FLONASE Place 2 sprays into both nostrils 2 (two) times daily as needed for allergies.   gabapentin 100 MG capsule Commonly known as: NEURONTIN Take 300 mg by mouth at bedtime.   GENTEAL TEARS OP Place 1 drop into both eyes daily.   glipiZIDE 2.5 MG 24 hr tablet Commonly known as: GLUCOTROL XL Take 2.5 mg by mouth daily with breakfast.   Glucosamine HCl 1000 MG Tabs Take 1,000 mg by mouth 2 (two) times daily.   ipratropium 0.03 % nasal spray Commonly known as: ATROVENT Place 2 sprays into both nostrils 2 (two) times daily as needed for rhinitis.   loperamide 2 MG tablet Commonly known as: IMODIUM A-D Take 2 mg by mouth daily as needed for diarrhea or loose stools.   methocarbamol 500 MG tablet Commonly known as: ROBAXIN Take 500 mg by mouth at bedtime as needed for muscle spasms.   metoprolol succinate 25 MG 24 hr tablet Commonly known as: TOPROL-XL Take 1 tablet (25 mg total) by mouth daily.   multivitamin with minerals Tabs tablet Take 1 tablet by mouth 2 (two) times daily.   Omega 3 1200 MG Caps Take 2,400 mg by mouth 2 (two) times daily.   Probiotic Caps Take 1 capsule by mouth daily.   pseudoephedrine 30 MG tablet Commonly known as: SUDAFED Take  60 mg by mouth every 4 (four) hours as needed for congestion.   Saw Palmetto 450 MG Caps Take 900 mg by mouth daily.   simvastatin 40 MG tablet Commonly known as: ZOCOR Take 40 mg by mouth at bedtime.   triamcinolone cream 0.1  % Commonly known as: KENALOG Apply 1 application topically 3 (three) times daily as needed for itching.   Vitamin D3 50 MCG (2000 UT) Tabs Take 4,000 Units by mouth 2 (two) times daily.   zinc gluconate 50 MG tablet Take 50 mg by mouth daily.          Outstanding Labs/Studies   none  Duration of Discharge Encounter   Greater than 30 minutes including physician time.  Signed, [name], PA-C 07/31/2019, 9:50 AM [phone]  I have personally seen and examined this patient. I agree with the assessment and plan as outlined above.  He is doing well this am. Echo with normally functioning AVR.  BP stable.   Discharge home today.   Lauree Chandler 07/31/2019 11:13 AM

## 2019-08-01 ENCOUNTER — Telehealth: Payer: Self-pay | Admitting: Physician Assistant

## 2019-08-01 NOTE — Telephone Encounter (Signed)
  [place] VALVE TEAM   Patient contacted regarding discharge from [facility] on 7/1  Patient understands to follow up with provider Nell Range on 7/8 at [facility].  Patient understands discharge instructions? yes Patient understands medications and regimen? yes Patient understands to bring all medications to this visit? yes  [name] PA-C  [facility]

## 2019-08-07 ENCOUNTER — Ambulatory Visit: Payer: Medicare Other | Admitting: Physician Assistant

## 2019-08-07 ENCOUNTER — Other Ambulatory Visit: Payer: Self-pay

## 2019-08-07 ENCOUNTER — Encounter: Payer: Self-pay | Admitting: Physician Assistant

## 2019-08-07 ENCOUNTER — Ambulatory Visit (INDEPENDENT_AMBULATORY_CARE_PROVIDER_SITE_OTHER): Payer: Medicare Other | Admitting: Physician Assistant

## 2019-08-07 VITALS — BP 112/70 | HR 72 | Ht 68.0 in | Wt 159.4 lb

## 2019-08-07 DIAGNOSIS — R5383 Other fatigue: Secondary | ICD-10-CM

## 2019-08-07 DIAGNOSIS — Z9861 Coronary angioplasty status: Secondary | ICD-10-CM

## 2019-08-07 DIAGNOSIS — I2510 Atherosclerotic heart disease of native coronary artery without angina pectoris: Secondary | ICD-10-CM | POA: Diagnosis not present

## 2019-08-07 DIAGNOSIS — I10 Essential (primary) hypertension: Secondary | ICD-10-CM | POA: Diagnosis not present

## 2019-08-07 DIAGNOSIS — Z952 Presence of prosthetic heart valve: Secondary | ICD-10-CM

## 2019-08-07 DIAGNOSIS — I5032 Chronic diastolic (congestive) heart failure: Secondary | ICD-10-CM

## 2019-08-07 MED ORDER — CLINDAMYCIN HCL 300 MG PO CAPS
ORAL_CAPSULE | ORAL | 3 refills | Status: DC
Start: 2019-08-07 — End: 2019-09-11

## 2019-08-07 NOTE — Progress Notes (Deleted)
[place]                                       Cardiology Office Note    Date:  08/07/2019   ID:  Victor [name], Victor [name] Nov 30, 1931, MRN [id number]  PCP:  Sofie Hartigan, MD  Cardiologist: [name], MD / Dr. [name] & Dr. Roxy Manns (TAVR)  CC: [facility] s/p TAVR  History of Present Illness:  Victor [name] is a 86 y.o. male with a history of arthritis, carotid artery disease, BPH, CAD, DM, GERD, HLD, HTN, diverticulitis s/p partial colectomyandcriticalaortic stenosiss/p TAVR (07/29/19) who presents to clinic for follow up.  Patient's cardiac history dates back to 2008 when he first was found to have coronary artery disease associated with abnormal stress test. He was treated with PCI and stenting of the right coronary artery at that time and has done well. He has been followed intermittently ever since by Dr. [name]. He has developed aortic stenosis which has gradually progressed in severity on follow-up echocardiographic imaging. Echocardiogram performed March 06, 2019 revealed severe aortic stenosis with peak velocity across aortic valve measured 4.4 m/s corresponding to mean transvalvular gradient estimated 42 mmHg and aortic valve area estimated 0.79 cm by VTI. The DVI was reported 0.23. Left ventricular systolic function remain normal. Repeat echocardiogram was performed Jun 12, 2019 and confirmed the presence of severe aortic stenosis with peak velocity across aortic valve measured greater than 4.5 m/s corresponding to mean transvalvular gradient estimated 52 mmHg and aortic valve area calculated only 0.49 cm by VTI. Left ventricular systolic function remain normal. Patient was seen in follow-up by [name] [place] Jun 18, 2019 at which time he complained of progressive symptoms of exertional shortness of breath and decreased energy. Diagnostic cardiac catheterization was performed Jun 25, 2019 and confirmed  the presence of severe aortic stenosis with peak to peak and mean transvalvular gradients measured 45 and 44 mmHg by catheterization, respectively.Patient was noted to have continuous patency of stent in the right coronary artery with otherwise mild nonobstructive coronary artery disease. Left ventricular systolic function remain normal and right heart pressures were normal.   He was evaluated by the multidisciplinary valve team and underwent successful TAVR with a17mm Edwards Sapien 3 THV via the TF approach on 07/29/19. Pt had some hypotension post operatively that required pressors and he was sent to the cardiac ICU. Post operative showed EF 60%, normally functioning TAVR with a mean gradient of 5 mm Hg and no PVL. He was started on asprin and plavix .    Past Medical History:  Diagnosis Date  . Angina   . Arthritis   . Bilateral carotid artery disease ([place])    CAROTID DOPPLER,05/21/2008 - Right and left ICA-0-49% diameter reduction, left CCA-0-49% diameter reductiion  . BPH (benign prostatic hypertrophy)   . CAD S/P percutaneous coronary angioplasty 07/2006   PCI to RCA - Promus DES 2.5 mm x 23 mm; 2D ECHO - EF >55%, moderate calcification of the aortic valve leaflets  . Diabetes mellitus   . Diverticulitis    s/p colectomy  . GERD (gastroesophageal reflux disease)   . High cholesterol   . History of kidney stones    per patient, "a very long time ago and it passed by itself"  . Hypertension    "from the diabetes"  . 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 Severe aortic stenosis   . Squamous cell skin cancer, nasal tip     Past Surgical History:  Procedure Laterality Date  . CARDIAC CATHETERIZATION with PCI  08/27/2006   RCA-mid - 2.5x88mm Promus stent  . CATARACT EXTRACTION W/ INTRAOCULAR LENS  IMPLANT, BILATERAL    . CATARACT EXTRACTION W/PHACO Right 03/08/2015    Procedure: CATARACT EXTRACTION PHACO AND INTRAOCULAR LENS PLACEMENT (IOC);  Surgeon: Estill Cotta, MD;  Location: [facility] ORS;  Service: Ophthalmology;  Laterality: Right;  [place]: 01:29.4   . COLECTOMY  ~ 2000  . LEFT HEART CATHETERIZATION WITH CORONARY ANGIOGRAM N/A 03/22/2011   Procedure: LEFT HEART CATHETERIZATION WITH CORONARY ANGIOGRAM;  Surgeon: Leonie Man, MD;  Location: [facility] C[facility] LAB;  Service: Cardiovascular::: Patent RCA stent w/ progression of pRCA Dz to ~50-60%. Progression of oD3 lesion to 60-70% - not optimal for PCI (b/c ostial).  EF 55-60% - no RWMA. Med Rx.  Aortic valve gradient: Peak 22 mmHg, mean 13 mmHg  . NM MYOVIEW LTD  08/2014   LOW RISK. NORMAL.  EF 45-54%.   Marland Kitchen RIGHT/LEFT HEART CATH AND CORONARY ANGIOGRAPHY N/A 06/25/2019   Procedure: RIGHT/LEFT HEART CATH AND CORONARY ANGIOGRAPHY;  Surgeon: Leonie Man, MD;  Location: [place] CV LAB;  Service: Cardiovascular;  Laterality: N/A;  . TEE WITHOUT CARDIOVERSION N/A 07/29/2019   Procedure: TRANSESOPHAGEAL ECHOCARDIOGRAM (TEE);  Surgeon: Burnell Blanks, MD;  Location: [place] CV LAB;  Service: Open Heart Surgery;  Laterality: N/A;  . TRANSCATHETER AORTIC VALVE REPLACEMENT, TRANSFEMORAL N/A 07/29/2019   Procedure: TRANSCATHETER AORTIC VALVE REPLACEMENT, TRANSFEMORAL;  Surgeon: Burnell Blanks, MD;  Location: [place] CV LAB;  Service: Open Heart Surgery;  Laterality: N/A;  . TRANSTHORACIC ECHOCARDIOGRAM  04/2017   Normal LV size and function.  EF 60-65%.  GR 1 DD.  Moderate aortic stenosis with estimated valve area between 0.88-0.97 cm.  (Mean gradient 25 mmHg, peak gradient 43 mmHg)  . TRANSTHORACIC ECHOCARDIOGRAM  08/26/2015   Normal LV size with mild concentric hypertrophy. Normal EF 55-60%. No RWMA. GR 1 DD. Severe aortic stenosis (mean gradient 24 mmHg, AVA 0.75 cm) --> progression from 2016. Mild-moderate MR. Mildly dilated RV. Mildly elevated PA pressures (40 mmHg.  Marland Kitchen TRANSTHORACIC  ECHOCARDIOGRAM  06/2016   EF 60-65%. GR 1 DD. Moderate to severe aortic stenosis with a mean gradient 30 mmHg, peak gradient of 57 mmHg.   Marland Kitchen TRANSURETHRAL RESECTION OF PROSTATE  ~ 2010    Current Medications: Outpatient Medications Prior to Visit  Medication Sig Dispense Refill  . acetaminophen (TYLENOL) 500 MG tablet Take 500 mg by mouth every 6 (six) hours as needed for moderate pain or headache.    . Artificial Tear Solution (GENTEAL TEARS OP) Place 1 drop into both eyes daily.    Marland Kitchen ascorbic acid (VITAMIN C) 500 MG tablet Take 1,000-1,500 mg by mouth See admin instructions. Take 1000 mg in the morning and 1500 mg at night    . aspirin EC 81 MG tablet Take 81 mg by mouth daily.    . benzonatate (TESSALON) 200 MG capsule Take 200 mg by mouth 3 (three) times daily as needed for cough.    . bismuth subsalicylate (PEPTO BISMOL) 262 MG/15ML suspension Take 30 mLs by mouth every 6 (six) hours as needed for indigestion or diarrhea or loose stools.    Marland Kitchen  calcium carbonate (OS-CAL) 600 MG TABS tablet Take 600 mg by mouth daily.    . cetirizine (ZYRTEC) 10 MG tablet Take 10 mg by mouth daily.    . Cholecalciferol (VITAMIN D3) 2000 UNITS TABS Take 4,000 Units by mouth 2 (two) times daily.     Marland Kitchen CINNAMON PO Take 1,000 mg by mouth 2 (two) times daily.    . clopidogrel (PLAVIX) 75 MG tablet Take 1 tablet (75 mg total) by mouth daily with breakfast. 90 tablet 1  . Coenzyme Q10 (COQ-10) 100 MG CAPS Take 100 mg by mouth daily.    . Cyanocobalamin (B-12) 2500 MCG TABS Take 2,500 mcg by mouth daily.    Marland Kitchen donepezil (ARICEPT) 10 MG tablet Take 10 mg by mouth daily.    Marland Kitchen EPIPEN 2-PAK 0.3 MG/0.3ML SOAJ injection Inject 0.3 mLs as directed as needed for anaphylaxis.     Marland Kitchen ezetimibe (ZETIA) 10 MG tablet Take 10 mg by mouth daily.    . fluticasone (FLONASE) 50 MCG/ACT nasal spray Place 2 sprays into both nostrils 2 (two) times daily as needed for allergies.     Marland Kitchen gabapentin (NEURONTIN) 100 MG capsule Take 300 mg by  mouth at bedtime.     Marland Kitchen glipiZIDE (GLUCOTROL XL) 2.5 MG 24 hr tablet Take 2.5 mg by mouth daily with breakfast.    . Glucosamine HCl 1000 MG TABS Take 1,000 mg by mouth 2 (two) times daily.    Marland Kitchen ipratropium (ATROVENT) 0.03 % nasal spray Place 2 sprays into both nostrils 2 (two) times daily as needed for rhinitis.    Marland Kitchen loperamide (IMODIUM A-D) 2 MG tablet Take 2 mg by mouth daily as needed for diarrhea or loose stools.    . methocarbamol (ROBAXIN) 500 MG tablet Take 500 mg by mouth at bedtime as needed for muscle spasms.    . metoprolol succinate (TOPROL-XL) 25 MG 24 hr tablet Take 1 tablet (25 mg total) by mouth daily. 90 tablet 3  . Multiple Vitamin (MULITIVITAMIN WITH MINERALS) TABS Take 1 tablet by mouth 2 (two) times daily.     . Omega 3 1200 MG CAPS Take 2,400 mg by mouth 2 (two) times daily.     . Probiotic CAPS Take 1 capsule by mouth daily.    . pseudoephedrine (SUDAFED) 30 MG tablet Take 60 mg by mouth every 4 (four) hours as needed for congestion.    . Pumpkin Seed-Soy Germ (AZO BLADDER CONTROL/GO-LESS PO) Take 1 tablet by mouth daily.    . Saw Palmetto 450 MG CAPS Take 900 mg by mouth daily.     . simvastatin (ZOCOR) 40 MG tablet Take 40 mg by mouth at bedtime.    . triamcinolone cream (KENALOG) 0.1 % Apply 1 application topically 3 (three) times daily as needed for itching.    . zinc gluconate 50 MG tablet Take 50 mg by mouth daily.     No facility-administered medications prior to visit.     Allergies:   Azithromycin, Cephalosporins, Codeine, Doxycycline, Phenylephrine-guaifenesin, Pseudoephedrine, Amoxicillin, Levofloxacin, Penicillins, Sulfa antibiotics, and Sulfasalazine   Social History   Socioeconomic History  . Marital status: Married    Spouse name: Not on file  . Number of children: 2  . Years of education: Not on file  . Highest education level: Not on file  Occupational History  . Occupation: Proofreader  Tobacco Use  . Smoking status: Never Smoker   . Smokeless tobacco: Former Systems developer    Types: Secondary school teacher  .  Vaping Use: Never used  Substance and Sexual Activity  . Alcohol use: Yes    Alcohol/week: 6.0 standard drinks    Types: 6 Glasses of wine per week  . Drug use: No  . Sexual activity: Yes  Other Topics Concern  . Not on file  Social History Narrative   Father of 2, grandfather 57.   Exercise for almost 2 hours a day, doing least 20 minutes on the elliptical trainer. He does his to 4 days a week. He'll also does weights and stretching exercises.   Social Determinants of Health   Financial Resource Strain:   . Difficulty of Paying Living Expenses:   Food Insecurity:   . Worried About Charity fundraiser in the Last Year:   . Arboriculturist in the Last Year:   Transportation Needs:   . Film/video editor (Medical):   Marland Kitchen Lack of Transportation (Non-Medical):   Physical Activity:   . Days of Exercise per Week:   . Minutes of Exercise per Session:   Stress:   . Feeling of Stress :   Social Connections:   . Frequency of Communication with Friends and Family:   . Frequency of Social Gatherings with Friends and Family:   . Attends Religious Services:   . Active Member of Clubs or Organizations:   . Attends Archivist Meetings:   Marland Kitchen Marital Status:      Family History:  The patient's family history includes ALS in his mother; Prostate cancer in his father.     ROS:   Please see the history of present illness.    ROS All other systems reviewed and are negative.   PHYSICAL EXAM:   VS:  There were no vitals taken for this visit.   GEN: Well nourished, well developed, in no acute distress HEENT: normal Neck: no JVD or masses Cardiac: ***RRR; no murmurs, rubs, or gallops,no edema  Respiratory:  clear to auscultation bilaterally, normal work of breathing GI: soft, nontender, nondistended, + BS MS: no deformity or atrophy Skin: warm and dry, no rash Neuro:  Alert and Oriented x 3, Strength and  sensation are intact Psych: euthymic mood, full affect   Wt Readings from Last 3 Encounters:  07/31/19 157 lb 6.5 oz (71.4 kg)  07/25/19 161 lb 13.1 oz (73.4 kg)  07/18/19 162 lb 14.7 oz (73.9 kg)      Studies/Labs Reviewed:   EKG:  EKG is*** ordered today.  The ekg ordered today demonstrates ***  Recent Labs: 07/25/2019: ALT 31; B Natriuretic Peptide 216.2 07/30/2019: Magnesium 1.9 07/31/2019: BUN 10; Creatinine, Ser 0.80; Hemoglobin 13.2; Platelets 110; Potassium 3.8; Sodium 141   Lipid Panel    Component Value Date/Time   CHOL 127 03/22/2011 0545   TRIG 133 03/22/2011 0545   HDL 48 03/22/2011 0545   CHOLHDL 2.6 03/22/2011 0545   VLDL 27 03/22/2011 0545   LDLCALC 52 03/22/2011 0545    Additional studies/ records that were reviewed today include:  TAVR OPERATIVE NOTE   Date of Procedure:07/29/2019  Preoperative Diagnosis:Severe Aortic Stenosis   Postoperative Diagnosis:Same   Procedure:   Transcatheter Aortic Valve Replacement - PercutaneousRightTransfemoral Approach Edwards Sapien 3 THV (size 66mm, model # 9600TFX, serial [id number])  Co-Surgeons:Christopher [name], MD andClarence H. Roxy Manns, MD   Anesthesiologist:William Therisa Doyne, MD  Echocardiographer:Mihai Croitoru, MD  Pre-operative Echo Findings: ? Severe aortic stenosis ? Normalleft ventricular systolic function  Post-operative Echo Findings: ? Noparavalvular leak ? Normalleft ventricular systolic function  ____________________   Echo6/30/21: IMPRESSIONS 1. Left ventricular ejection fraction, by estimation, is 60 to 65%. The  left ventricle has normal function. The left ventricle has no regional  wall motion abnormalities. There is mild concentric left ventricular  hypertrophy. Left ventricular diastolic  function could not be evaluated.  2. Right  ventricular systolic function is normal. The right ventricular  size is normal. Tricuspid regurgitation signal is inadequate for assessing  PA pressure.  3. Left atrial size was moderately dilated.  4. The mitral valve is degenerative. Mild to moderate mitral valve  regurgitation.  5. There is no perivalvular leak. The aortic valve has been  repaired/replaced. Aortic valve regurgitation is not visualized. There is  a Transport planner prosthetic (TAVR) valve present in the aortic position.  Procedure Date: 07/29/2019. Echo findings are  consistent with normal structure and function of the aortic valve  prosthesis. Aortic valve mean gradient measures 5.0 mmHg. Aortic valve  Vmax measures 1.66 m/s.  Comparison(s): No significant change from prior study. Prior images  reviewed side by side.      ASSESSMENT & PLAN:   Severe AS s/p TAVR:  HTN:   CAD: pre TAVR cath showed stable coronary arteries with 50 to 60% proximal RCA followed by brief ecstatic segment and then widely patent mid stent. Otherwise, small caliber 1st Diag ~70% ostial stenosis.Continue medical therapy.   Chronic diastolic CHF:      Medication Adjustments/Labs and Tests Ordered: Current medicines are reviewed at length with the patient today.  Concerns regarding medicines are outlined above.  Medication changes, Labs and Tests ordered today are listed in the Patient Instructions below. There are no Patient Instructions on file for this visit.   Signed, [name], PA-C  08/07/2019 4:21 AM    [facility] [place], [place], [place]  [address] Phone: [phone]; Fax: [phone]

## 2019-08-07 NOTE — Progress Notes (Signed)
[place]                                       Cardiology Office Note    Date:  08/07/2019   ID:  [name], Victor [name] 03-26-1931, MRN [id number]  PCP:  Victor Hartigan, MD  Cardiologist: Victor Hew, MD/ Dr. Angelena [name] & Dr. Roxy [name] (TAVR)  CC: Victor [name] s/p TAVR  History of Present Illness:  Victor [name] is a 86 y.o. male with a history of arthritis, carotid artery disease, BPH, CAD, DM, GERD, HLD, HTN, diverticulitis s/p partial colectomyandcriticalaortic stenosiss/p TAVR (07/29/19) who presents to clinic for follow up.  Patient's cardiac history dates back to 2008 when he first was found to have coronary artery disease associated with abnormal stress test. He was treated with PCI and stenting of the right coronary artery at that time and has done well. He has been followed intermittently ever since by Dr. [name]. He has developed aortic stenosis which has gradually progressed in severity on follow-up echocardiographic imaging. Echocardiogram performed March 06, 2019 revealed severe aortic stenosis with peak velocity across aortic valve measured 4.4 m/s corresponding to mean transvalvular gradient estimated 42 mmHg and aortic valve area estimated 0.79 cm by VTI. The DVI was reported 0.23. Left ventricular systolic function remain normal. Repeat echocardiogram was performed Jun 12, 2019 and confirmed the presence of severe aortic stenosis with peak velocity across aortic valve measured greater than 4.5 m/s corresponding to mean transvalvular gradient estimated 52 mmHg and aortic valve area calculated only 0.49 cm by VTI. Left ventricular systolic function remain normal. Patient was seen in follow-up by Victor [name] Jun 18, 2019 at which time he complained of progressive symptoms of exertional shortness of breath and decreased energy. Diagnostic cardiac catheterization was performed Jun 25, 2019 and  confirmed the presence of severe aortic stenosis with peak to peak and mean transvalvular gradients measured 45 and 44 mmHg by catheterization, respectively.Patient was noted to have continuous patency of stent in the right coronary artery with otherwise mild nonobstructive coronary artery disease. Left ventricular systolic function remain normal and right heart pressures were normal.  He was evaluated by the multidisciplinary valve team and underwent successful TAVR with a9mm Edwards Sapien 3 THV via the TF approach on 07/29/19. Pt had some hypotension post operatively that required pressors and he was sent to the cardiac ICU. Post operativeshowed EF 60%, normally functioning TAVR with a mean gradient of 5 mm Hg and no PVL. He was started on asprin and plavix .  Today he presents to clinic for follow up. Here with his wife. No CP or SOB. No LE edema, orthopnea or PND. No dizziness or syncope. No blood in stool or urine. No palpitations. He was feeling great in the first day after TAVR but now having more fatigue. Wife says he is sleeping all the time with no energy.    Past Medical History:  Diagnosis Date  . Angina   . Arthritis   . Bilateral carotid artery disease ([place])    CAROTID DOPPLER,05/21/2008 - Right and left ICA-0-49% diameter reduction, left CCA-0-49% diameter reductiion  . BPH (benign prostatic hypertrophy)   . CAD S/P percutaneous coronary angioplasty 07/2006   PCI to RCA - Promus DES 2.5 mm x 23 mm; 2D ECHO - EF >55%, moderate calcification of the aortic valve leaflets  .  Diabetes mellitus   . Diverticulitis    s/p colectomy  . GERD (gastroesophageal reflux disease)   . High cholesterol   . History of kidney stones    per patient, "a very long time ago and it passed by itself"  . Hypertension    "from the diabetes"  . 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  Severe aortic stenosis   . Squamous cell skin cancer, nasal tip     Past Surgical History:  Procedure Laterality Date  . CARDIAC CATHETERIZATION with PCI  08/27/2006   RCA-mid - 2.5x56mm Promus stent  . CATARACT EXTRACTION W/ INTRAOCULAR LENS  IMPLANT, BILATERAL    . CATARACT EXTRACTION W/PHACO Right 03/08/2015   Procedure: CATARACT EXTRACTION PHACO AND INTRAOCULAR LENS PLACEMENT (IOC);  Surgeon: Victor Cotta, MD;  Location: [facility] ORS;  Service: Ophthalmology;  Laterality: Right;  [place]: 01:29.4   . COLECTOMY  ~ 2000  . LEFT HEART CATHETERIZATION WITH CORONARY ANGIOGRAM N/A 03/22/2011   Procedure: LEFT HEART CATHETERIZATION WITH CORONARY ANGIOGRAM;  Surgeon: Victor Man, MD;  Location: [facility] CATH LAB;  Service: Cardiovascular::: Patent RCA stent w/ progression of pRCA Dz to ~50-60%. Progression of oD3 lesion to 60-70% - not optimal for PCI (b/c ostial).  EF 55-60% - no RWMA. Med Rx.  Aortic valve gradient: Peak 22 mmHg, mean 13 mmHg  . NM MYOVIEW LTD  08/2014   LOW RISK. NORMAL.  EF 45-54%.   Marland Kitchen RIGHT/LEFT HEART CATH AND CORONARY ANGIOGRAPHY N/A 06/25/2019   Procedure: RIGHT/LEFT HEART CATH AND CORONARY ANGIOGRAPHY;  Surgeon: Victor Man, MD;  Location: [facility] LAB;  Service: Cardiovascular;  Laterality: N/A;  . TEE WITHOUT CARDIOVERSION N/A 07/29/2019   Procedure: TRANSESOPHAGEAL ECHOCARDIOGRAM (TEE);  Surgeon: Victor Blanks, MD;  Location: [place] LAB;  Service: Open Heart Surgery;  Laterality: N/A;  . TRANSCATHETER AORTIC VALVE REPLACEMENT, TRANSFEMORAL N/A 07/29/2019   Procedure: TRANSCATHETER AORTIC VALVE REPLACEMENT, TRANSFEMORAL;  Surgeon: Victor Blanks, MD;  Location: [place] [facility] LAB;  Service: Open Heart Surgery;  Laterality: N/A;  . TRANSTHORACIC ECHOCARDIOGRAM  04/2017   Normal LV size and function.  EF 60-65%.  GR 1 DD.  Moderate aortic stenosis with estimated valve area between 0.88-0.97 cm.  (Mean gradient 25 mmHg, peak gradient 43 mmHg)  .  TRANSTHORACIC ECHOCARDIOGRAM  08/26/2015   Normal LV size with mild concentric hypertrophy. Normal EF 55-60%. No RWMA. GR 1 DD. Severe aortic stenosis (mean gradient 24 mmHg, AVA 0.75 cm) --> progression from 2016. Mild-moderate MR. Mildly dilated RV. Mildly elevated PA pressures (40 mmHg.  Marland Kitchen TRANSTHORACIC ECHOCARDIOGRAM  06/2016   EF 60-65%. GR 1 DD. Moderate to severe aortic stenosis with a mean gradient 30 mmHg, peak gradient of 57 mmHg.   Marland Kitchen TRANSURETHRAL RESECTION OF PROSTATE  ~ 2010    Current Medications: Outpatient Medications Prior to Visit  Medication Sig Dispense Refill  . acetaminophen (TYLENOL) 500 MG tablet Take 500 mg by mouth every 6 (six) hours as needed for moderate pain or headache.    . Artificial Tear Solution (GENTEAL TEARS OP) Place 1 drop into both eyes daily.    Marland Kitchen ascorbic acid (VITAMIN C) 500 MG tablet Take 1,000-1,500 mg by mouth See admin instructions. Take 1000 mg in the morning and 1500 mg at night    . aspirin EC 81 MG tablet Take 81  mg by mouth daily.    . benzonatate (TESSALON) 200 MG capsule Take 200 mg by mouth 3 (three) times daily as needed for cough.    . bismuth subsalicylate (PEPTO BISMOL) 262 MG/15ML suspension Take 30 mLs by mouth every 6 (six) hours as needed for indigestion or diarrhea or loose stools.    . calcium carbonate (OS-CAL) 600 MG TABS tablet Take 600 mg by mouth daily.    . cetirizine (ZYRTEC) 10 MG tablet Take 10 mg by mouth daily.    . Cholecalciferol (VITAMIN D3) 2000 UNITS TABS Take 4,000 Units by mouth 2 (two) times daily.     Marland Kitchen CINNAMON PO Take 1,000 mg by mouth 2 (two) times daily.    . clopidogrel (PLAVIX) 75 MG tablet Take 1 tablet (75 mg total) by mouth daily with breakfast. 90 tablet 1  . Coenzyme Q10 (COQ-10) 100 MG CAPS Take 100 mg by mouth daily.    . Cyanocobalamin (B-12) 2500 MCG TABS Take 2,500 mcg by mouth daily.    Marland Kitchen donepezil (ARICEPT) 10 MG tablet Take 10 mg by mouth daily.    Marland Kitchen EPIPEN 2-PAK 0.3 MG/0.3ML SOAJ  injection Inject 0.3 mLs as directed as needed for anaphylaxis.     Marland Kitchen ezetimibe (ZETIA) 10 MG tablet Take 10 mg by mouth daily.    . fluticasone (FLONASE) 50 MCG/ACT nasal spray Place 2 sprays into both nostrils 2 (two) times daily as needed for allergies.     Marland Kitchen gabapentin (NEURONTIN) 100 MG capsule Take 300 mg by mouth at bedtime.     Marland Kitchen glipiZIDE (GLUCOTROL XL) 2.5 MG 24 hr tablet Take 2.5 mg by mouth daily with breakfast.    . Glucosamine HCl 1000 MG TABS Take 1,000 mg by mouth 2 (two) times daily.    Marland Kitchen ipratropium (ATROVENT) 0.03 % nasal spray Place 2 sprays into both nostrils 2 (two) times daily as needed for rhinitis.    Marland Kitchen loperamide (IMODIUM A-D) 2 MG tablet Take 2 mg by mouth daily as needed for diarrhea or loose stools.    . methocarbamol (ROBAXIN) 500 MG tablet Take 500 mg by mouth at bedtime as needed for muscle spasms.    . metoprolol succinate (TOPROL-XL) 25 MG 24 hr tablet Take 1 tablet (25 mg total) by mouth daily. 90 tablet 3  . Multiple Vitamin (MULITIVITAMIN WITH MINERALS) TABS Take 1 tablet by mouth 2 (two) times daily.     . Omega 3 1200 MG CAPS Take 2,400 mg by mouth 2 (two) times daily.     . Probiotic CAPS Take 1 capsule by mouth daily.    . pseudoephedrine (SUDAFED) 30 MG tablet Take 60 mg by mouth every 4 (four) hours as needed for congestion.    . Saw Palmetto 450 MG CAPS Take 900 mg by mouth daily.     . simvastatin (ZOCOR) 40 MG tablet Take 40 mg by mouth at bedtime.    . triamcinolone cream (KENALOG) 0.1 % Apply 1 application topically 3 (three) times daily as needed for itching.    . zinc gluconate 50 MG tablet Take 50 mg by mouth daily.    . Pumpkin Seed-Soy Germ (AZO BLADDER CONTROL/GO-LESS PO) Take 1 tablet by mouth daily. (Patient not taking: Reported on 08/07/2019)     No facility-administered medications prior to visit.     Allergies:   Azithromycin, Cephalosporins, Codeine, Doxycycline, Phenylephrine-guaifenesin, Pseudoephedrine, Amoxicillin, Levofloxacin,  Penicillins, Sulfa antibiotics, and Sulfasalazine   Social History   Socioeconomic History  .  Marital status: Married    Spouse name: Not on file  . Number of children: 2  . Years of education: Not on file  . Highest education level: Not on file  Occupational History  . Occupation: Proofreader  Tobacco Use  . Smoking status: Never Smoker  . Smokeless tobacco: Former Systems developer    Types: Secondary school teacher  . Vaping Use: Never used  Substance and Sexual Activity  . Alcohol use: Yes    Alcohol/week: 6.0 standard drinks    Types: 6 Glasses of wine per week  . Drug use: No  . Sexual activity: Yes  Other Topics Concern  . Not on file  Social History Narrative   Father of 2, grandfather 50.   Exercise for almost 2 hours a day, doing least 20 minutes on the elliptical trainer. He does his to 4 days a week. He'll also does weights and stretching exercises.   Social Determinants of Health   Financial Resource Strain:   . Difficulty of Paying Living Expenses:   Food Insecurity:   . Worried About Charity fundraiser in the Last Year:   . Arboriculturist in the Last Year:   Transportation Needs:   . Film/video editor (Medical):   Marland Kitchen Lack of Transportation (Non-Medical):   Physical Activity:   . Days of Exercise per Week:   . Minutes of Exercise per Session:   Stress:   . Feeling of Stress :   Social Connections:   . Frequency of Communication with Friends and Family:   . Frequency of Social Gatherings with Friends and Family:   . Attends Religious Services:   . Active Member of Clubs or Organizations:   . Attends Archivist Meetings:   Marland Kitchen Marital Status:      Family History:  The patient's family history includes ALS in his mother; Prostate cancer in his father.     ROS:   Please see the history of present illness.    ROS All other systems reviewed and are negative.   PHYSICAL EXAM:   VS:  BP 112/70 (BP Location: Right Arm, Patient Position: Sitting,  Cuff Size: Normal)   Pulse 72   Ht 5\' 8"  (1.727 m)   Wt 159 lb 6.4 oz (72.3 kg)   SpO2 96%   BMI 24.24 kg/m    GEN: Well nourished, well developed, in no acute distress HEENT: normal Neck: no JVD or masses Cardiac: RRR; no murmurs, rubs, or gallops,no edema  Respiratory:  clear to auscultation bilaterally, normal work of breathing GI: soft, nontender, nondistended, + BS MS: no deformity or atrophy Skin: warm and dry, no rash  Groin sites clear without hematoma or ecchymosis  Neuro:  Alert and Oriented x 3, Strength and sensation are intact Psych: euthymic mood, full affect   Wt Readings from Last 3 Encounters:  08/07/19 159 lb 6.4 oz (72.3 kg)  07/31/19 157 lb 6.5 oz (71.4 kg)  07/25/19 161 lb 13.1 oz (73.4 kg)      Studies/Labs Reviewed:   EKG:  EKG is ordered today. The ekg ordered today demonstrates sinus with 1st deg AV block and PVCs, HR 68 bpm  Recent Labs: 07/25/2019: ALT 31; B Natriuretic Peptide 216.2 07/30/2019: Magnesium 1.9 07/31/2019: BUN 10; Creatinine, Ser 0.80; Hemoglobin 13.2; Platelets 110; Potassium 3.8; Sodium 141   Lipid Panel    Component Value Date/Time   CHOL 127 03/22/2011 0545   TRIG 133 03/22/2011 0545   HDL  48 03/22/2011 0545   CHOLHDL 2.6 03/22/2011 0545   VLDL 27 03/22/2011 0545   LDLCALC 52 03/22/2011 0545    Additional studies/ records that were reviewed today include:  TAVR OPERATIVE NOTE   Date of Procedure:07/29/2019  Preoperative Diagnosis:Severe Aortic Stenosis   Postoperative Diagnosis:Same   Procedure:   Transcatheter Aortic Valve Replacement - PercutaneousRightTransfemoral Approach Edwards Sapien 3 THV (size 33mm, model # 9600TFX, serial [id number])  Co-Surgeons:Christopher Victor Form, MD andClarence H. Victor Manns, MD   Anesthesiologist:William Therisa Doyne, MD  Echocardiographer:Mihai Croitoru,  MD  Pre-operative Echo Findings: ? Severe aortic stenosis ? Normalleft ventricular systolic function  Post-operative Echo Findings: ? Noparavalvular leak ? Normalleft ventricular systolic function   ____________________   Echo6/30/21: IMPRESSIONS 1. Left ventricular ejection fraction, by estimation, is 60 to 65%. The  left ventricle has normal function. The left ventricle has no regional  wall motion abnormalities. There is mild concentric left ventricular  hypertrophy. Left ventricular diastolic  function could not be evaluated.  2. Right ventricular systolic function is normal. The right ventricular  size is normal. Tricuspid regurgitation signal is inadequate for assessing  PA pressure.  3. Left atrial size was moderately dilated.  4. The mitral valve is degenerative. Mild to moderate mitral valve  regurgitation.  5. There is no perivalvular leak. The aortic valve has been  repaired/replaced. Aortic valve regurgitation is not visualized. There is  a Transport planner prosthetic (TAVR) valve present in the aortic position.  Procedure Date: 07/29/2019. Echo findings are  consistent with normal structure and function of the aortic valve  prosthesis. Aortic valve mean gradient measures 5.0 mmHg. Aortic valve  Vmax measures 1.66 m/s.  Comparison(s): No significant change from prior study. Prior images  reviewed side by side.     ASSESSMENT & PLAN:   Severe AS s/p TAVR: doing okay- having more fatigue and decreased energy. ECG with sinus with 1st deg AV block and PVCs, HR 68 bpm. There is no HAVB. Groin sites stable.  Continue on aspirin and plavix. SBE prophylaxis discussed; I have RX'd clindamycin due to a PCN allergy. With worsening fatigue, will check basic labs including BMET, CBC and TSH. I will see him back next month for echo and 1 month follow up.  HTN:Bp well controlled. No changes made.   CAD: pre TAVR cath showed stable coronary arteries with  50 to 60% proximal RCA followed by brief ecstatic segment and then widely patent mid stent. Otherwise, small caliber 1st Diag ~70% ostial stenosis.Continue medical therapy.   Chronic diastolic CHF: appears euvolemic off diuretics.    Medication Adjustments/Labs and Tests Ordered: Current medicines are reviewed at length with the patient today.  Concerns regarding medicines are outlined above.  Medication changes, Labs and Tests ordered today are listed in the Patient Instructions below. Patient Instructions  Medication Instructions:  1) Take Clindamycin 600mg  1 hour prior to any dental work you are going to have.  *If you need a refill on your cardiac medications before your next appointment, please call your pharmacy*   Lab Work: BMET, CBC and TSH today  If you have labs (blood work) drawn today and your tests are completely normal, you will receive your results only by: Marland Kitchen MyChart Message (if you have MyChart) OR . A paper copy in the mail If you have any lab test that is abnormal or we need to change your treatment, we will call you to review the results.   Testing/Procedures: None   Follow-Up:  Keep current follow up  appointments for August.     Signed, Victor Form, PA-C  08/07/2019 4:24 PM    [address], [place],   [address] Phone: [phone]; Fax: [phone]

## 2019-08-07 NOTE — Patient Instructions (Addendum)
Medication Instructions:  1) Take Clindamycin 600mg  1 hour prior to any dental work you are going to have.  *If you need a refill on your cardiac medications before your next appointment, please call your pharmacy*   Lab Work: BMET, CBC and TSH today  If you have labs (blood work) drawn today and your tests are completely normal, you will receive your results only by: Marland Kitchen MyChart Message (if you have MyChart) OR . A paper copy in the mail If you have any lab test that is abnormal or we need to change your treatment, we will call you to review the results.   Testing/Procedures: None   Follow-Up:  Keep current follow up appointments for August.

## 2019-08-08 LAB — CBC
Hematocrit: 39.8 % (ref 37.5–51.0)
Hemoglobin: 13.8 g/dL (ref 13.0–17.7)
MCH: 32.1 pg (ref 26.6–33.0)
MCHC: 34.7 g/dL (ref 31.5–35.7)
MCV: 93 fL (ref 79–97)
Platelets: 172 10*3/uL (ref 150–450)
RBC: 4.3 x10E6/uL (ref 4.14–5.80)
RDW: 12.3 % (ref 11.6–15.4)
WBC: 6.7 10*3/uL (ref 3.4–10.8)

## 2019-08-08 LAB — BASIC METABOLIC PANEL
BUN/Creatinine Ratio: 22 (ref 10–24)
BUN: 19 mg/dL (ref 8–27)
CO2: 25 mmol/L (ref 20–29)
Calcium: 9.6 mg/dL (ref 8.6–10.2)
Chloride: 103 mmol/L (ref 96–106)
Creatinine, Ser: 0.88 mg/dL (ref 0.76–1.27)
GFR calc Af Amer: 91 mL/min/{1.73_m2} (ref >59)
GFR calc non Af Amer: 78 mL/min/{1.73_m2} (ref >59)
Glucose: 123 mg/dL — ABNORMAL HIGH (ref 65–99)
Potassium: 4.7 mmol/L (ref 3.5–5.2)
Sodium: 141 mmol/L (ref 134–144)

## 2019-08-08 LAB — TSH: TSH: 3.78 u[IU]/mL (ref 0.450–4.500)

## 2019-08-12 ENCOUNTER — Other Ambulatory Visit: Payer: Self-pay

## 2019-08-12 ENCOUNTER — Encounter: Payer: Medicare Other | Attending: Cardiology

## 2019-08-12 DIAGNOSIS — Z952 Presence of prosthetic heart valve: Secondary | ICD-10-CM | POA: Insufficient documentation

## 2019-08-12 NOTE — Progress Notes (Signed)
Virtual Visit completed. Patient informed on EP and RD appointment and 6 Minute walk test. Patient also informed of patient health questionnaires on My Chart. Patient Verbalizes understanding. Visit diagnosis can be found in [facility] 07/29/2019.

## 2019-08-19 ENCOUNTER — Other Ambulatory Visit: Payer: Self-pay

## 2019-08-19 ENCOUNTER — Encounter: Payer: Medicare Other | Admitting: *Deleted

## 2019-08-19 VITALS — Ht 68.7 in | Wt 159.3 lb

## 2019-08-19 DIAGNOSIS — Z952 Presence of prosthetic heart valve: Secondary | ICD-10-CM

## 2019-08-19 NOTE — Progress Notes (Signed)
Cardiac Individual Treatment Plan  Patient Details  Name: Victor [name] MRN: [id number] Date of Birth: 1931-07-11 Referring Provider:     Cardiac Rehab from 08/19/2019 in [facility] Cardiac and Pulmonary Rehab  Referring Provider [name] MD      Initial Encounter Date:    Cardiac Rehab from 08/19/2019 in [facility] Cardiac and Pulmonary Rehab  Date 08/19/19      Visit Diagnosis: S/P TAVR (transcatheter aortic valve replacement)  Patient's Home Medications on Admission:  Current Outpatient Medications:  .  acetaminophen (TYLENOL) 500 MG tablet, Take 500 mg by mouth every 6 (six) hours as needed for moderate pain or headache., Disp: , Rfl:  .  Artificial Tear Solution (GENTEAL TEARS OP), Place 1 drop into both eyes daily., Disp: , Rfl:  .  ascorbic acid (VITAMIN C) 500 MG tablet, Take 1,000-1,500 mg by mouth See admin instructions. Take 1000 mg in the morning and 1500 mg at night, Disp: , Rfl:  .  aspirin EC 81 MG tablet, Take 81 mg by mouth daily., Disp: , Rfl:  .  benzonatate (TESSALON) 200 MG capsule, Take 200 mg by mouth 3 (three) times daily as needed for cough., Disp: , Rfl:  .  bismuth subsalicylate (PEPTO BISMOL) 262 MG/15ML suspension, Take 30 mLs by mouth every 6 (six) hours as needed for indigestion or diarrhea or loose stools., Disp: , Rfl:  .  calcium carbonate (OS-CAL) 600 MG TABS tablet, Take 600 mg by mouth daily., Disp: , Rfl:  .  cetirizine (ZYRTEC) 10 MG tablet, Take 10 mg by mouth daily., Disp: , Rfl:  .  Cholecalciferol (VITAMIN D3) 2000 UNITS TABS, Take 4,000 Units by mouth 2 (two) times daily. , Disp: , Rfl:  .  CINNAMON PO, Take 1,000 mg by mouth 2 (two) times daily., Disp: , Rfl:  .  clindamycin (CLEOCIN) 300 MG capsule, Take 2 tablets by mouth 1 hour prior to dental work, Disp: 2 capsule, Rfl: 3 .  clopidogrel (PLAVIX) 75 MG tablet, Take 1 tablet (75 mg total) by mouth daily with breakfast., Disp: 90 tablet, Rfl: 1 .  Coenzyme Q10 (COQ-10) 100 MG CAPS, Take 100 mg  by mouth daily., Disp: , Rfl:  .  Cyanocobalamin (B-12) 2500 MCG TABS, Take 2,500 mcg by mouth daily., Disp: , Rfl:  .  donepezil (ARICEPT) 10 MG tablet, Take 10 mg by mouth daily., Disp: , Rfl:  .  EPIPEN 2-PAK 0.3 MG/0.3ML SOAJ injection, Inject 0.3 mLs as directed as needed for anaphylaxis. , Disp: , Rfl:  .  ezetimibe (ZETIA) 10 MG tablet, Take 10 mg by mouth daily., Disp: , Rfl:  .  fluticasone (FLONASE) 50 MCG/ACT nasal spray, Place 2 sprays into both nostrils 2 (two) times daily as needed for allergies. , Disp: , Rfl:  .  gabapentin (NEURONTIN) 100 MG capsule, Take 300 mg by mouth at bedtime. , Disp: , Rfl:  .  glipiZIDE (GLUCOTROL XL) 2.5 MG 24 hr tablet, Take 2.5 mg by mouth daily with breakfast., Disp: , Rfl:  .  Glucosamine HCl 1000 MG TABS, Take 1,000 mg by mouth 2 (two) times daily., Disp: , Rfl:  .  ipratropium (ATROVENT) 0.03 % nasal spray, Place 2 sprays into both nostrils 2 (two) times daily as needed for rhinitis., Disp: , Rfl:  .  loperamide (IMODIUM A-D) 2 MG tablet, Take 2 mg by mouth daily as needed for diarrhea or loose stools., Disp: , Rfl:  .  methocarbamol (ROBAXIN) 500 MG tablet, Take 500 mg  by mouth at bedtime as needed for muscle spasms., Disp: , Rfl:  .  metoprolol succinate (TOPROL-XL) 25 MG 24 hr tablet, Take 1 tablet (25 mg total) by mouth daily., Disp: 90 tablet, Rfl: 3 .  Multiple Vitamin (MULITIVITAMIN WITH MINERALS) TABS, Take 1 tablet by mouth 2 (two) times daily. , Disp: , Rfl:  .  Omega 3 1200 MG CAPS, Take 2,400 mg by mouth 2 (two) times daily. , Disp: , Rfl:  .  Probiotic CAPS, Take 1 capsule by mouth daily., Disp: , Rfl:  .  pseudoephedrine (SUDAFED) 30 MG tablet, Take 60 mg by mouth every 4 (four) hours as needed for congestion., Disp: , Rfl:  .  Saw Palmetto 450 MG CAPS, Take 900 mg by mouth daily. , Disp: , Rfl:  .  simvastatin (ZOCOR) 40 MG tablet, Take 40 mg by mouth at bedtime., Disp: , Rfl:  .  triamcinolone cream (KENALOG) 0.1 %, Apply 1  application topically 3 (three) times daily as needed for itching., Disp: , Rfl:  .  zinc gluconate 50 MG tablet, Take 50 mg by mouth daily., Disp: , Rfl:   Past Medical History: Past Medical History:  Diagnosis Date  . Angina   . Arthritis   . Bilateral carotid artery disease ([place])    CAROTID DOPPLER,05/21/2008 - Right and left ICA-0-49% diameter reduction, left CCA-0-49% diameter reductiion  . BPH (benign prostatic hypertrophy)   . CAD S/P percutaneous coronary angioplasty 07/2006   PCI to RCA - Promus DES 2.5 mm x 23 mm; 2D ECHO - EF >55%, moderate calcification of the aortic valve leaflets  . Diabetes mellitus   . Diverticulitis    s/p colectomy  . GERD (gastroesophageal reflux disease)   . High cholesterol   . History of kidney stones    per patient, "a very long time ago and it passed by itself"  . Hypertension    "from the diabetes"  . 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 Severe aortic stenosis   . Squamous cell skin cancer, nasal tip     Tobacco Use: Social History   Tobacco Use  Smoking Status Never Smoker  Smokeless Tobacco Former Systems developer  . Types: Chew    Labs: Recent Review Flowsheet Data    Labs for ITP Cardiac and Pulmonary Rehab Latest Ref Rng & Units 07/25/2019 07/29/2019 07/29/2019 07/29/2019 07/29/2019   Cholestrol 0 - 200 mg/dL - - - - -   LDLCALC 0 - 99 mg/dL - - - - -   HDL >39 mg/dL - - - - -   Trlycerides <150 mg/dL - - - - -   Hemoglobin A1c 4.8 - 5.6 % 6.4(H) - - - -   PHART 7.35 - 7.45 - 7.426 7.433 - -   PCO2ART 32 - 48 mmHg - 42.5 43.8 - -   HCO3 20.0 - 28.0 mmol/L - 27.5 29.3(H) - -   TCO2 22 - 32 mmol/L - - _0 O2SAT % - 95.3 100.0 - -       Exercise Target Goals: Exercise Program Goal: Individual exercise prescription set using results from initial 6 min walk test and THRR while considering  patient's activity barriers  and safety.   Exercise Prescription Goal: Initial exercise prescription builds to 30-45 minutes a day of aerobic activity, 2-3 days per week.  Home  exercise guidelines will be given to patient during program as part of exercise prescription that the participant will acknowledge.   Education: Aerobic Exercise & Resistance Training: - Gives group verbal and written instruction on the various components of exercise. Focuses on aerobic and resistive training programs and the benefits of this training and how to safely progress through these programs..   Education: Exercise & Equipment Safety: - Individual verbal instruction and demonstration of equipment use and safety with use of the equipment.   Cardiac Rehab from 08/19/2019 in [facility] Cardiac and Pulmonary Rehab  Date 08/12/19  Educator [facility]  Instruction Review Code 1- Verbalizes Understanding      Education: Exercise Physiology & General Exercise Guidelines: - Group verbal and written instruction with models to review the exercise physiology of the cardiovascular system and associated critical values. Provides general exercise guidelines with specific guidelines to those with heart or lung disease.    Cardiac Rehab from 08/19/2019 in [facility] Cardiac and Pulmonary Rehab  Date 08/19/19  Instruction Review Code 3- Needs Reinforcement  [need identified]      Education: Flexibility, Balance, Mind/Body Relaxation: Provides group verbal/written instruction on the benefits of flexibility and balance training, including mind/body exercise modes such as yoga, pilates and tai chi.  Demonstration and skill practice provided.   Activity Barriers & Risk Stratification:  Activity Barriers & Cardiac Risk Stratification - 08/19/19 1127      Activity Barriers & Cardiac Risk Stratification   Activity Barriers Arthritis;Back Problems;Deconditioning;Muscular Weakness;Balance Concerns   occasional back pain, arth in hands   Cardiac Risk Stratification High             6 Minute Walk:  6 Minute Walk    Row Name 08/19/19 1127         6 Minute Walk   Phase Initial     Distance 935 feet     Walk Time 6 minutes     # of Rest Breaks 0     MPH 1.77     METS 1.78     RPE 9     VO2 Peak 6.23     Symptoms No     Resting HR 74 bpm     Resting BP 130/64     Resting Oxygen Saturation  97 %     Exercise Oxygen Saturation  during 6 min walk 95 %     Max Ex. HR 97 bpm     Max Ex. BP 146/74     2 Minute Post BP 132/70            Oxygen Initial Assessment:   Oxygen Re-Evaluation:   Oxygen Discharge (Final Oxygen Re-Evaluation):   Initial Exercise Prescription:  Initial Exercise Prescription - 08/19/19 1100      Date of Initial Exercise RX and Referring Provider   Date 08/19/19    Referring Provider [name] MD      Treadmill   MPH 1.7    Grade 0    Minutes 15    METs 2.3      Recumbant Bike   Level 1    RPM 50    Watts 5    Minutes 15    METs 1.7      NuStep   Level 1    SPM 80    Minutes 15    METs 1.7      Prescription Details   Frequency (times per week) 2    Duration Progress to 30 minutes of continuous aerobic  without signs/symptoms of physical distress      Intensity   THRR 40-80% of Max Heartrate 112-127    Ratings of Perceived Exertion 11-13    Perceived Dyspnea 0-4      Progression   Progression Continue to progress workloads to maintain intensity without signs/symptoms of physical distress.      Resistance Training   Training Prescription Yes    Weight 3 lb    Reps 10-15           Perform Capillary Blood Glucose checks as needed.  Exercise Prescription Changes:  Exercise Prescription Changes    Row Name 08/19/19 1100             Response to Exercise   Blood Pressure (Admit) 130/64       Blood Pressure (Exercise) 146/74       Blood Pressure (Exit) 132/70       Heart Rate (Admit) 74 bpm       Heart Rate (Exercise) 97 bpm       Heart Rate (Exit) 69 bpm       Oxygen  Saturation (Admit) 97 %       Oxygen Saturation (Exercise) 95 %       Rating of Perceived Exertion (Exercise) 9       Symptoms none       Comments walk test results              Exercise Comments:   Exercise Goals and Review:  Exercise Goals    Row Name 08/19/19 1129             Exercise Goals   Increase Physical Activity Yes       Intervention Provide advice, education, support and counseling about physical activity/exercise needs.;Develop an individualized exercise prescription for aerobic and resistive training based on initial evaluation findings, risk stratification, comorbidities and participant's personal goals.       Expected Outcomes Short Term: Attend rehab on a regular basis to increase amount of physical activity.;Long Term: Add in home exercise to make exercise part of routine and to increase amount of physical activity.;Long Term: Exercising regularly at least 3-5 days a week.       Increase Strength and Stamina Yes       Intervention Provide advice, education, support and counseling about physical activity/exercise needs.;Develop an individualized exercise prescription for aerobic and resistive training based on initial evaluation findings, risk stratification, comorbidities and participant's personal goals.       Expected Outcomes Short Term: Increase workloads from initial exercise prescription for resistance, speed, and METs.;Short Term: Perform resistance training exercises routinely during rehab and add in resistance training at home;Long Term: Improve cardiorespiratory fitness, muscular endurance and strength as measured by increased METs and functional capacity (6MWT)       Able to understand and use rate of perceived exertion (RPE) scale Yes       Intervention Provide education and explanation on how to use RPE scale       Expected Outcomes Short Term: Able to use RPE daily in rehab to express subjective intensity level;Long Term:  Able to use RPE to guide  intensity level when exercising independently       Able to understand and use Dyspnea scale Yes       Intervention Provide education and explanation on how to use Dyspnea scale       Expected Outcomes Short Term: Able to use Dyspnea scale daily in rehab to express subjective sense  of shortness of breath during exertion;Long Term: Able to use Dyspnea scale to guide intensity level when exercising independently       Knowledge and understanding of Target Heart Rate Range (THRR) Yes       Intervention Provide education and explanation of THRR including how the numbers were predicted and where they are located for reference       Expected Outcomes Short Term: Able to state/look up THRR;Short Term: Able to use daily as guideline for intensity in rehab;Long Term: Able to use THRR to govern intensity when exercising independently       Able to check pulse independently Yes       Intervention Provide education and demonstration on how to check pulse in carotid and radial arteries.;Review the importance of being able to check your own pulse for safety during independent exercise       Expected Outcomes Short Term: Able to explain why pulse checking is important during independent exercise;Long Term: Able to check pulse independently and accurately       Understanding of Exercise Prescription Yes       Intervention Provide education, explanation, and written materials on patient's individual exercise prescription       Expected Outcomes Long Term: Able to explain home exercise prescription to exercise independently;Short Term: Able to explain program exercise prescription              Exercise Goals Re-Evaluation :   Discharge Exercise Prescription (Final Exercise Prescription Changes):  Exercise Prescription Changes - 08/19/19 1100      Response to Exercise   Blood Pressure (Admit) 130/64    Blood Pressure (Exercise) 146/74    Blood Pressure (Exit) 132/70    Heart Rate (Admit) 74 bpm    Heart  Rate (Exercise) 97 bpm    Heart Rate (Exit) 69 bpm    Oxygen Saturation (Admit) 97 %    Oxygen Saturation (Exercise) 95 %    Rating of Perceived Exertion (Exercise) 9    Symptoms none    Comments walk test results           Nutrition:  Target Goals: Understanding of nutrition guidelines, daily intake of sodium <15105m, cholesterol <2048m calories 30% from fat and 7% or less from saturated fats, daily to have 5 or more servings of fruits and vegetables.  Education: Controlling Sodium/Reading Food Labels -Group verbal and written material supporting the discussion of sodium use in heart healthy nutrition. Review and explanation with models, verbal and written materials for utilization of the food label.   Education: General Nutrition Guidelines/Fats and Fiber: -Group instruction provided by verbal, written material, models and posters to present the general guidelines for heart healthy nutrition. Gives an explanation and review of dietary fats and fiber.   [facility]  Date 08/19/19  Instruction Review Code 3- Needs Reinforcement  [need identified]      Biometrics:  Pre Biometrics - 08/19/19 1132      Pre Biometrics   Height 5' 8.7" (1.745 m)    Weight 159 lb 4.8 oz (72.3 kg)    BMI (Calculated) 23.73    Single Leg Stand 2.69 seconds            Nutrition Therapy Plan and Nutrition Goals:   Nutrition Assessments:   MEDIFICTS Score Key:          ?70 Need to make dietary changes          40-70 Heart  Healthy Diet         ? 40 Therapeutic Level Cholesterol Diet  Nutrition Goals Re-Evaluation:   Nutrition Goals Discharge (Final Nutrition Goals Re-Evaluation):   Psychosocial: Target Goals: Acknowledge presence or absence of significant depression and/or stress, maximize coping skills, provide positive support system. Participant is able to verbalize types and ability to use techniques and skills needed for  reducing stress and depression.   Education: Depression - Provides group verbal and written instruction on the correlation between heart/lung disease and depressed mood, treatment options, and the stigmas associated with seeking treatment.   Education: Sleep Hygiene -Provides group verbal and written instruction about how sleep can affect your health.  Define sleep hygiene, discuss sleep cycles and impact of sleep habits. Review good sleep hygiene tips.     Education: Stress and Anxiety: - Provides group verbal and written instruction about the health risks of elevated stress and causes of high stress.  Discuss the correlation between heart/lung disease and anxiety and treatment options. Review healthy ways to manage with stress and anxiety.    Initial Review & Psychosocial Screening:  Initial Psych Review & Screening - 08/12/19 0838      Initial Review   Current issues with None Identified      Family Dynamics   Good Support System? Yes    Comments He can look to his wife, son and daughter for support. He states he is an overall positive person.      Barriers   Psychosocial barriers to participate in program The patient should benefit from training in stress management and relaxation.      Screening Interventions   Interventions Encouraged to exercise;Provide feedback about the scores to participant;Program counselor consult;To provide support and resources with identified psychosocial needs    Expected Outcomes Short Term goal: Utilizing psychosocial counselor, staff and physician to assist with identification of specific Stressors or current issues interfering with healing process. Setting desired goal for each stressor or current issue identified.;Long Term Goal: Stressors or current issues are controlled or eliminated.;Short Term goal: Identification and review with participant of any Quality of Life or Depression concerns found by scoring the questionnaire.;Long Term goal: The  participant improves quality of Life and PHQ9 Scores as seen by post scores and/or verbalization of changes           Quality of Life Scores:   Scores of 19 and below usually indicate a poorer quality of life in these areas.  A difference of  2-3 points is a clinically meaningful difference.  A difference of 2-3 points in the total score of the Quality of Life Index has been associated with significant improvement in overall quality of life, self-image, physical symptoms, and general health in studies assessing change in quality of life.  PHQ-9: Recent Review Flowsheet Data    Depression screen [facility] 2/9 08/19/2019   Decreased Interest 0   Down, Depressed, Hopeless 0   PHQ - 2 Score 0   Altered sleeping 0   Tired, decreased energy 0   Change in appetite 0   Feeling bad or failure about yourself  0   Trouble concentrating 0   Moving slowly or fidgety/restless 0   Suicidal thoughts 0   PHQ-9 Score 0   Difficult doing work/chores Not difficult at all     Interpretation of Total Score  Total Score Depression Severity:  1-4 = Minimal depression, 5-9 = Mild depression, 10-14 = Moderate depression, 15-19 = Moderately severe depression, 20-27 = Severe  depression   Psychosocial Evaluation and Intervention:  Psychosocial Evaluation - 08/12/19 0839      Psychosocial Evaluation & Interventions   Interventions Encouraged to exercise with the program and follow exercise prescription    Comments He can look to his wife, son and daughter for support. He states he is an overall positive person.    Expected Outcomes Short: Exercise regularly to support mental health and notify staff of any changes. Long: maintain mental health and well being through teaching of rehab or prescribed medications independently.    Continue Psychosocial Services  Follow up required by staff           Psychosocial Re-Evaluation:   Psychosocial Discharge (Final Psychosocial Re-Evaluation):   Vocational  Rehabilitation: Provide vocational rehab assistance to qualifying candidates.   Vocational Rehab Evaluation & Intervention:   Education: Education Goals: Education classes will be provided on a variety of topics geared toward better understanding of heart health and risk factor modification. Participant will state understanding/return demonstration of topics presented as noted by education test scores.  Learning Barriers/Preferences:  Learning Barriers/Preferences - 08/12/19 0837      Learning Barriers/Preferences   Learning Barriers None    Learning Preferences None           General Cardiac Education Topics:  AED/CPR: - Group verbal and written instruction with the use of models to demonstrate the basic use of the AED with the basic ABC's of resuscitation.   Anatomy & Physiology of the Heart: - Group verbal and written instruction and models provide basic cardiac anatomy and physiology, with the coronary electrical and arterial systems. Review of Valvular disease and Heart Failure   Cardiac Rehab from 08/19/2019 in [facility] Cardiac and Pulmonary Rehab  Date 08/19/19  Instruction Review Code 3- Needs Reinforcement  [need identified]      Cardiac Procedures: - Group verbal and written instruction to review commonly prescribed medications for heart disease. Reviews the medication, class of the drug, and side effects. Includes the steps to properly store meds and maintain the prescription regimen. (beta blockers and nitrates)   Cardiac Medications I: - Group verbal and written instruction to review commonly prescribed medications for heart disease. Reviews the medication, class of the drug, and side effects. Includes the steps to properly store meds and maintain the prescription regimen.   Cardiac Medications II: -Group verbal and written instruction to review commonly prescribed medications for heart disease. Reviews the medication, class of the drug, and side effects. (all other  drug classes)    Go Sex-Intimacy & Heart Disease, Get SMART - Goal Setting: - Group verbal and written instruction through game format to discuss heart disease and the return to sexual intimacy. Provides group verbal and written material to discuss and apply goal setting through the application of the S.M.A.R.T. Method.   Other Matters of the Heart: - Provides group verbal, written materials and models to describe Stable Angina and Peripheral Artery. Includes description of the disease process and treatment options available to the cardiac patient.   Infection Prevention: - Provides verbal and written material to individual with discussion of infection control including proper hand washing and proper equipment cleaning during exercise session.   Cardiac Rehab from 08/19/2019 in [facility] Cardiac and Pulmonary Rehab  Date 08/12/19  Educator [facility]  Instruction Review Code 1- Verbalizes Understanding      Falls Prevention: - Provides verbal and written material to individual with discussion of falls prevention and safety.   Cardiac Rehab from 08/19/2019 in  [place] Cardiac and Pulmonary Rehab  Date 08/12/19  Educator [facility]  Instruction Review Code 1- Verbalizes Understanding      Other: -Provides group and verbal instruction on various topics (see comments)   Knowledge Questionnaire Score:   Core Components/Risk Factors/Patient Goals at Admission:  Personal Goals and Risk Factors at Admission - 08/12/19 0837      Core Components/Risk Factors/Patient Goals on Admission    Weight Management Yes;Weight Gain    Intervention Weight Management: Develop a combined nutrition and exercise program designed to reach desired caloric intake, while maintaining appropriate intake of nutrient and fiber, sodium and fats, and appropriate energy expenditure required for the weight goal.;Weight Management: Provide education and appropriate resources to help participant work on and attain dietary goals.;Weight  Management/Obesity: Establish reasonable short term and long term weight goals.    Expected Outcomes Short Term: Continue to assess and modify interventions until short term weight is achieved;Long Term: Adherence to nutrition and physical activity/exercise program aimed toward attainment of established weight goal;Weight Maintenance: Understanding of the daily nutrition guidelines, which includes 25-35% calories from fat, 7% or less cal from saturated fats, less than 221m cholesterol, less than 1.5gm of sodium, & 5 or more servings of fruits and vegetables daily;Understanding recommendations for meals to include 15-35% energy as protein, 25-35% energy from fat, 35-60% energy from carbohydrates, less than 206mof dietary cholesterol, 20-35 gm of total fiber daily;Understanding of distribution of calorie intake throughout the day with the consumption of 4-5 meals/snacks;Weight Gain: Understanding of general recommendations for a high calorie, high protein meal plan that promotes weight gain by distributing calorie intake throughout the day with the consumption for 4-5 meals, snacks, and/or supplements    Diabetes Yes    Intervention Provide education about proper nutrition, including hydration, and aerobic/resistive exercise prescription along with prescribed medications to achieve blood glucose in normal ranges: Fasting glucose 65-99 mg/dL;Provide education about signs/symptoms and action to take for hypo/hyperglycemia.    Expected Outcomes Short Term: Participant verbalizes understanding of the signs/symptoms and immediate care of hyper/hypoglycemia, proper foot care and importance of medication, aerobic/resistive exercise and nutrition plan for blood glucose control.;Long Term: Attainment of HbA1C < 7%.    Hypertension Yes    Intervention Provide education on lifestyle modifcations including regular physical activity/exercise, weight management, moderate sodium restriction and increased consumption of  fresh fruit, vegetables, and low fat dairy, alcohol moderation, and smoking cessation.;Monitor prescription use compliance.    Expected Outcomes Short Term: Continued assessment and intervention until BP is < 140/9088mG in hypertensive participants. < 130/39m57m in hypertensive participants with diabetes, heart failure or chronic kidney disease.;Long Term: Maintenance of blood pressure at goal levels.    Lipids Yes    Intervention Provide education and support for participant on nutrition & aerobic/resistive exercise along with prescribed medications to achieve LDL <70mg33mL >40mg.37mExpected Outcomes Short Term: Participant states understanding of desired cholesterol values and is compliant with medications prescribed. Participant is following exercise prescription and nutrition guidelines.;Long Term: Cholesterol controlled with medications as prescribed, with individualized exercise RX and with personalized nutrition plan. Value goals: LDL < 70mg, 53m> 40 mg.           Education:Diabetes - Individual verbal and written instruction to review signs/symptoms of diabetes, desired ranges of glucose level fasting, after meals and with exercise. Acknowledge that pre and post exercise glucose checks will be done for 3 sessions at entry of program.   Cardiac Rehab from 08/19/2019 in  [facility]  Date 08/12/19  Educator [facility]  Instruction Review Code 1- Verbalizes Understanding      Education: Know Your Numbers and Risk Factors: -Group verbal and written instruction about important numbers in your health.  Discussion of what are risk factors and how they play a role in the disease process.  Review of Cholesterol, Blood Pressure, Diabetes, and BMI and the role they play in your overall health.   Core Components/Risk Factors/Patient Goals Review:    Core Components/Risk Factors/Patient Goals at Discharge (Final Review):    ITP Comments:  ITP Comments    Row Name 08/12/19  0841 08/19/19 1127         ITP Comments Virtual Visit completed. Patient informed on EP and RD appointment and 6 Minute walk test. Patient also informed of patient health questionnaires on My Chart. Patient Verbalizes understanding. Visit diagnosis can be found in [facility] 07/29/2019. Completed 6MWT and gym orientation. Initial ITP created and sent for review to Dr. Emily Filbert, Medical Director.             Comments: Initial ITP

## 2019-08-19 NOTE — Patient Instructions (Signed)
Patient Instructions  Patient Details  Name: Victor [name] MRN: [id number] Date of Birth: 1931/07/29 Referring Provider:  Leonie Man, MD  Below are your personal goals for exercise, nutrition, and risk factors. Our goal is to help you stay on track towards obtaining and maintaining these goals. We will be discussing your progress on these goals with you throughout the program.  Initial Exercise Prescription:  Initial Exercise Prescription - 08/19/19 1100      Date of Initial Exercise RX and Referring Provider   Date 08/19/19    Referring Provider [name] MD      Treadmill   MPH 1.7    Grade 0    Minutes 15    METs 2.3      Recumbant Bike   Level 1    RPM 50    Watts 5    Minutes 15    METs 1.7      NuStep   Level 1    SPM 80    Minutes 15    METs 1.7      Prescription Details   Frequency (times per week) 2    Duration Progress to 30 minutes of continuous aerobic without signs/symptoms of physical distress      Intensity   THRR 40-80% of Max Heartrate 112-127    Ratings of Perceived Exertion 11-13    Perceived Dyspnea 0-4      Progression   Progression Continue to progress workloads to maintain intensity without signs/symptoms of physical distress.      Resistance Training   Training Prescription Yes    Weight 3 lb    Reps 10-15           Exercise Goals: Frequency: Be able to perform aerobic exercise two to three times per week in program working toward 2-5 days per week of home exercise.  Intensity: Work with a perceived exertion of 11 (fairly light) - 15 (hard) while following your exercise prescription.  We will make changes to your prescription with you as you progress through the program.   Duration: Be able to do 30 to 45 minutes of continuous aerobic exercise in addition to a 5 minute warm-up and a 5 minute cool-down routine.   Nutrition Goals: Your personal nutrition goals will be established when you do your nutrition analysis with  the dietician.  The following are general nutrition guidelines to follow: Cholesterol < 200mg /day Sodium < 1500mg /day Fiber: Men over 50 yrs - 30 grams per day  Personal Goals:  Personal Goals and Risk Factors at Admission - 08/12/19 0837      Core Components/Risk Factors/Patient Goals on Admission    Weight Management Yes;Weight Gain    Intervention Weight Management: Develop a combined nutrition and exercise program designed to reach desired caloric intake, while maintaining appropriate intake of nutrient and fiber, sodium and fats, and appropriate energy expenditure required for the weight goal.;Weight Management: Provide education and appropriate resources to help participant work on and attain dietary goals.;Weight Management/Obesity: Establish reasonable short term and long term weight goals.    Expected Outcomes Short Term: Continue to assess and modify interventions until short term weight is achieved;Long Term: Adherence to nutrition and physical activity/exercise program aimed toward attainment of established weight goal;Weight Maintenance: Understanding of the daily nutrition guidelines, which includes 25-35% calories from fat, 7% or less cal from saturated fats, less than 200mg  cholesterol, less than 1.5gm of sodium, & 5 or more servings of fruits and vegetables daily;Understanding recommendations for meals  to include 15-35% energy as protein, 25-35% energy from fat, 35-60% energy from carbohydrates, less than 200mg  of dietary cholesterol, 20-35 gm of total fiber daily;Understanding of distribution of calorie intake throughout the day with the consumption of 4-5 meals/snacks;Weight Gain: Understanding of general recommendations for a high calorie, high protein meal plan that promotes weight gain by distributing calorie intake throughout the day with the consumption for 4-5 meals, snacks, and/or supplements    Diabetes Yes    Intervention Provide education about proper nutrition, including  hydration, and aerobic/resistive exercise prescription along with prescribed medications to achieve blood glucose in normal ranges: Fasting glucose 65-99 mg/dL;Provide education about signs/symptoms and action to take for hypo/hyperglycemia.    Expected Outcomes Short Term: Participant verbalizes understanding of the signs/symptoms and immediate care of hyper/hypoglycemia, proper foot care and importance of medication, aerobic/resistive exercise and nutrition plan for blood glucose control.;Long Term: Attainment of HbA1C < 7%.    Hypertension Yes    Intervention Provide education on lifestyle modifcations including regular physical activity/exercise, weight management, moderate sodium restriction and increased consumption of fresh fruit, vegetables, and low fat dairy, alcohol moderation, and smoking cessation.;Monitor prescription use compliance.    Expected Outcomes Short Term: Continued assessment and intervention until BP is < 140/57mm HG in hypertensive participants. < 130/39mm HG in hypertensive participants with diabetes, heart failure or chronic kidney disease.;Long Term: Maintenance of blood pressure at goal levels.    Lipids Yes    Intervention Provide education and support for participant on nutrition & aerobic/resistive exercise along with prescribed medications to achieve LDL 70mg , HDL >40mg .    Expected Outcomes Short Term: Participant states understanding of desired cholesterol values and is compliant with medications prescribed. Participant is following exercise prescription and nutrition guidelines.;Long Term: Cholesterol controlled with medications as prescribed, with individualized exercise RX and with personalized nutrition plan. Value goals: LDL < 70mg , HDL > 40 mg.           Tobacco Use Initial Evaluation: Social History   Tobacco Use  Smoking Status Never Smoker  Smokeless Tobacco Former Systems developer  . Types: Chew    Exercise Goals and Review:  Exercise Goals    Row Name  08/19/19 1129             Exercise Goals   Increase Physical Activity Yes       Intervention Provide advice, education, support and counseling about physical activity/exercise needs.;Develop an individualized exercise prescription for aerobic and resistive training based on initial evaluation findings, risk stratification, comorbidities and participant's personal goals.       Expected Outcomes Short Term: Attend rehab on a regular basis to increase amount of physical activity.;Long Term: Add in home exercise to make exercise part of routine and to increase amount of physical activity.;Long Term: Exercising regularly at least 3-5 days a week.       Increase Strength and Stamina Yes       Intervention Provide advice, education, support and counseling about physical activity/exercise needs.;Develop an individualized exercise prescription for aerobic and resistive training based on initial evaluation findings, risk stratification, comorbidities and participant's personal goals.       Expected Outcomes Short Term: Increase workloads from initial exercise prescription for resistance, speed, and METs.;Short Term: Perform resistance training exercises routinely during rehab and add in resistance training at home;Long Term: Improve cardiorespiratory fitness, muscular endurance and strength as measured by increased METs and functional capacity (6MWT)       Able to understand and use rate of perceived  exertion (RPE) scale Yes       Intervention Provide education and explanation on how to use RPE scale       Expected Outcomes Short Term: Able to use RPE daily in rehab to express subjective intensity level;Long Term:  Able to use RPE to guide intensity level when exercising independently       Able to understand and use Dyspnea scale Yes       Intervention Provide education and explanation on how to use Dyspnea scale       Expected Outcomes Short Term: Able to use Dyspnea scale daily in rehab to express  subjective sense of shortness of breath during exertion;Long Term: Able to use Dyspnea scale to guide intensity level when exercising independently       Knowledge and understanding of Target Heart Rate Range (THRR) Yes       Intervention Provide education and explanation of THRR including how the numbers were predicted and where they are located for reference       Expected Outcomes Short Term: Able to state/look up THRR;Short Term: Able to use daily as guideline for intensity in rehab;Long Term: Able to use THRR to govern intensity when exercising independently       Able to check pulse independently Yes       Intervention Provide education and demonstration on how to check pulse in carotid and radial arteries.;Review the importance of being able to check your own pulse for safety during independent exercise       Expected Outcomes Short Term: Able to explain why pulse checking is important during independent exercise;Long Term: Able to check pulse independently and accurately       Understanding of Exercise Prescription Yes       Intervention Provide education, explanation, and written materials on patient's individual exercise prescription       Expected Outcomes Long Term: Able to explain home exercise prescription to exercise independently;Short Term: Able to explain program exercise prescription              Copy of goals given to participant.

## 2019-08-25 ENCOUNTER — Other Ambulatory Visit: Payer: Self-pay | Admitting: Cardiology

## 2019-08-26 ENCOUNTER — Encounter: Payer: Medicare Other | Admitting: *Deleted

## 2019-08-26 ENCOUNTER — Other Ambulatory Visit: Payer: Self-pay

## 2019-08-26 DIAGNOSIS — Z952 Presence of prosthetic heart valve: Secondary | ICD-10-CM

## 2019-08-26 LAB — GLUCOSE, CAPILLARY
Glucose-Capillary: 162 mg/dL — ABNORMAL HIGH (ref 70–99)
Glucose-Capillary: 96 mg/dL (ref 70–99)

## 2019-08-26 NOTE — Progress Notes (Signed)
Daily Session Note  Patient Details  Name: Victor [name] MRN: [id number] Date of Birth: 10-04-1931 Referring Provider:     Cardiac Rehab from 08/19/2019 in [facility]  Referring Provider [name] MD      Encounter Date: 08/26/2019  Check In:  Session Check In - 08/26/19 1138      Check-In   Supervising physician immediately available to respond to emergencies See telemetry face sheet for immediately available ER MD    Location [facility]    Staff Present Heath Lark, RN, BSN, CCRP;Melissa [place] RDN, LDN;Joseph [employer], [place], ACSM CEP, Exercise Physiologist    Virtual Visit No    Medication changes reported     No    Fall or balance concerns reported    No    Warm-up and Cool-down Performed on first and last piece of equipment    Resistance Training Performed Yes    VAD Patient? No    PAD/SET Patient? No      Pain Assessment   Currently in Pain? No/denies              Social History   Tobacco Use  Smoking Status Never Smoker  Smokeless Tobacco Former Systems developer   Types: Chew    Goals Met:  Exercise tolerated well Personal goals reviewed No report of cardiac concerns or symptoms  Goals Unmet:  Not Applicable  Comments: First full day of exercise!  Patient was oriented to gym and equipment including functions, settings, policies, and procedures.  Patient's individual exercise prescription and treatment plan were reviewed.  All starting workloads were established based on the results of the 6 minute walk test done at initial orientation visit.  The plan for exercise progression was also introduced and progression will be customized based on patient's performance and goals.    Dr. Emily Filbert is Medical Director for [place] and [facility].

## 2019-09-02 ENCOUNTER — Other Ambulatory Visit: Payer: Self-pay

## 2019-09-02 ENCOUNTER — Encounter: Payer: Medicare Other | Attending: Cardiology | Admitting: *Deleted

## 2019-09-02 DIAGNOSIS — Z952 Presence of prosthetic heart valve: Secondary | ICD-10-CM

## 2019-09-02 LAB — GLUCOSE, CAPILLARY
Glucose-Capillary: 105 mg/dL — ABNORMAL HIGH (ref 70–99)
Glucose-Capillary: 186 mg/dL — ABNORMAL HIGH (ref 70–99)

## 2019-09-02 NOTE — Progress Notes (Signed)
Daily Session Note  Patient Details  Name: Victor [name] MRN: [id number] Date of Birth: 01-06-1932 Referring Provider:     Cardiac Rehab from 08/19/2019 in [facility]  Referring Provider [name] MD      Encounter Date: 09/02/2019  Check In:  Session Check In - 09/02/19 1126      Check-In   Supervising physician immediately available to respond to emergencies See telemetry face sheet for immediately available ER MD    Location [facility]    Staff Present Heath Lark, RN, BSN, CCRP;Melissa [place] RDN, LDN;Joseph [employer], [place], ACSM CEP, Exercise Physiologist    Virtual Visit No    Medication changes reported     No    Fall or balance concerns reported    No    Warm-up and Cool-down Performed on first and last piece of equipment    Resistance Training Performed Yes    VAD Patient? No    PAD/SET Patient? No      Pain Assessment   Currently in Pain? No/denies              Social History   Tobacco Use  Smoking Status Never Smoker  Smokeless Tobacco Former Systems developer  . Types: Chew    Goals Met:  Exercise tolerated well No report of cardiac concerns or symptoms  Goals Unmet:  Not Applicable  Comments: Pt able to follow exercise prescription today without complaint.  Will continue to monitor for progression.    Dr. Emily Filbert is Medical Director for [place] and [facility].

## 2019-09-09 ENCOUNTER — Other Ambulatory Visit: Payer: Self-pay

## 2019-09-09 ENCOUNTER — Encounter: Payer: Medicare Other | Admitting: *Deleted

## 2019-09-09 DIAGNOSIS — Z952 Presence of prosthetic heart valve: Secondary | ICD-10-CM

## 2019-09-09 NOTE — Progress Notes (Signed)
Daily Session Note  Patient Details  Name: Victor [name] MRN: [id number] Date of Birth: 06/12/1931 Referring Provider:     Cardiac Rehab from 08/19/2019 in [facility]  Referring Provider [name] MD      Encounter Date: 09/09/2019  Check In:  Session Check In - 09/09/19 1115      Check-In   Supervising physician immediately available to respond to emergencies See telemetry face sheet for immediately available ER MD    Location [facility]    Staff Present Heath Lark, RN, BSN, Jacklynn Bue, MS Exercise Physiologist;Joseph [place], [place], ACSM CEP, Exercise Physiologist    Virtual Visit No    Medication changes reported     No    Fall or balance concerns reported    No    Warm-up and Cool-down Performed on first and last piece of equipment    Resistance Training Performed Yes    VAD Patient? No    PAD/SET Patient? No      Pain Assessment   Currently in Pain? No/denies              Social History   Tobacco Use  Smoking Status Never Smoker  Smokeless Tobacco Former Systems developer  . Types: Chew    Goals Met:  Independence with exercise equipment Exercise tolerated well No report of cardiac concerns or symptoms  Goals Unmet:  Not Applicable  Comments: Pt able to follow exercise prescription today without complaint.  Will continue to monitor for progression.    Dr. Emily Filbert is Medical Director for [place] [facility].

## 2019-09-10 ENCOUNTER — Encounter: Payer: Self-pay | Admitting: *Deleted

## 2019-09-10 DIAGNOSIS — Z952 Presence of prosthetic heart valve: Secondary | ICD-10-CM

## 2019-09-10 NOTE — Progress Notes (Signed)
Cardiac Individual Treatment Plan  Patient Details  Name: Victor [name] MRN: [id number] Date of Birth: 11-15-1931 Referring Provider:     Cardiac Rehab from 08/19/2019 in [facility] Cardiac and Pulmonary Rehab  Referring Provider [name] MD      Initial Encounter Date:    Cardiac Rehab from 08/19/2019 in [facility] Cardiac and Pulmonary Rehab  Date 08/19/19      Visit Diagnosis: S/P TAVR (transcatheter aortic valve replacement)  Patient's Home Medications on Admission:  Current Outpatient Medications:    acetaminophen (TYLENOL) 500 MG tablet, Take 500 mg by mouth every 6 (six) hours as needed for moderate pain or headache., Disp: , Rfl:    Artificial Tear Solution (GENTEAL TEARS OP), Place 1 drop into both eyes daily., Disp: , Rfl:    ascorbic acid (VITAMIN C) 500 MG tablet, Take 1,000-1,500 mg by mouth See admin instructions. Take 1000 mg in the morning and 1500 mg at night, Disp: , Rfl:    aspirin EC 81 MG tablet, Take 81 mg by mouth daily., Disp: , Rfl:    benzonatate (TESSALON) 200 MG capsule, Take 200 mg by mouth 3 (three) times daily as needed for cough., Disp: , Rfl:    bismuth subsalicylate (PEPTO BISMOL) 262 MG/15ML suspension, Take 30 mLs by mouth every 6 (six) hours as needed for indigestion or diarrhea or loose stools., Disp: , Rfl:    calcium carbonate (OS-CAL) 600 MG TABS tablet, Take 600 mg by mouth daily., Disp: , Rfl:    cetirizine (ZYRTEC) 10 MG tablet, Take 10 mg by mouth daily., Disp: , Rfl:    Cholecalciferol (VITAMIN D3) 2000 UNITS TABS, Take 4,000 Units by mouth 2 (two) times daily. , Disp: , Rfl:    CINNAMON PO, Take 1,000 mg by mouth 2 (two) times daily., Disp: , Rfl:    clindamycin (CLEOCIN) 300 MG capsule, Take 2 tablets by mouth 1 hour prior to dental work, Disp: 2 capsule, Rfl: 3   clopidogrel (PLAVIX) 75 MG tablet, Take 1 tablet (75 mg total) by mouth daily with breakfast., Disp: 90 tablet, Rfl: 1   Coenzyme Q10 (COQ-10) 100 MG CAPS, Take 100 mg  by mouth daily., Disp: , Rfl:    Cyanocobalamin (B-12) 2500 MCG TABS, Take 2,500 mcg by mouth daily., Disp: , Rfl:    donepezil (ARICEPT) 10 MG tablet, Take 10 mg by mouth daily., Disp: , Rfl:    EPIPEN 2-PAK 0.3 MG/0.3ML SOAJ injection, Inject 0.3 mLs as directed as needed for anaphylaxis. , Disp: , Rfl:    ezetimibe (ZETIA) 10 MG tablet, Take 10 mg by mouth daily., Disp: , Rfl:    fluticasone (FLONASE) 50 MCG/ACT nasal spray, Place 2 sprays into both nostrils 2 (two) times daily as needed for allergies. , Disp: , Rfl:    gabapentin (NEURONTIN) 100 MG capsule, Take 300 mg by mouth at bedtime. , Disp: , Rfl:    glipiZIDE (GLUCOTROL XL) 2.5 MG 24 hr tablet, Take 2.5 mg by mouth daily with breakfast., Disp: , Rfl:    Glucosamine HCl 1000 MG TABS, Take 1,000 mg by mouth 2 (two) times daily., Disp: , Rfl:    ipratropium (ATROVENT) 0.03 % nasal spray, Place 2 sprays into both nostrils 2 (two) times daily as needed for rhinitis., Disp: , Rfl:    loperamide (IMODIUM A-D) 2 MG tablet, Take 2 mg by mouth daily as needed for diarrhea or loose stools., Disp: , Rfl:    methocarbamol (ROBAXIN) 500 MG tablet, Take 500 mg  by mouth at bedtime as needed for muscle spasms., Disp: , Rfl:    metoprolol succinate (TOPROL-XL) 25 MG 24 hr tablet, Take 1 tablet by mouth once daily, Disp: 90 tablet, Rfl: 3   Multiple Vitamin (MULITIVITAMIN WITH MINERALS) TABS, Take 1 tablet by mouth 2 (two) times daily. , Disp: , Rfl:    Omega 3 1200 MG CAPS, Take 2,400 mg by mouth 2 (two) times daily. , Disp: , Rfl:    Probiotic CAPS, Take 1 capsule by mouth daily., Disp: , Rfl:    pseudoephedrine (SUDAFED) 30 MG tablet, Take 60 mg by mouth every 4 (four) hours as needed for congestion., Disp: , Rfl:    Saw Palmetto 450 MG CAPS, Take 900 mg by mouth daily. , Disp: , Rfl:    simvastatin (ZOCOR) 40 MG tablet, Take 40 mg by mouth at bedtime., Disp: , Rfl:    triamcinolone cream (KENALOG) 0.1 %, Apply 1 application  topically 3 (three) times daily as needed for itching., Disp: , Rfl:    zinc gluconate 50 MG tablet, Take 50 mg by mouth daily., Disp: , Rfl:   Past Medical History: Past Medical History:  Diagnosis Date   Angina    Arthritis    Bilateral carotid artery disease ([place])    CAROTID DOPPLER,05/21/2008 - Right and left ICA-0-49% diameter reduction, left CCA-0-49% diameter reductiion   BPH (benign prostatic hypertrophy)    CAD S/P percutaneous coronary angioplasty 07/2006   PCI to RCA - Promus DES 2.5 mm x 23 mm; 2D ECHO - EF >55%, moderate calcification of the aortic valve leaflets   Diabetes mellitus    Diverticulitis    s/p colectomy   GERD (gastroesophageal reflux disease)    High cholesterol    History of kidney stones    per patient, "a very long time ago and it passed by itself"   Hypertension    "from the diabetes"   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   s/p TAVR with a 29 mm Edwards Sapien 3 via the TF approach with Drs [name] & Roxy Manns    Severe aortic stenosis    Squamous cell skin cancer, nasal tip     Tobacco Use: Social History   Tobacco Use  Smoking Status Never Smoker  Smokeless Tobacco Former Systems developer   Types: Chew    Labs: Recent Chemical engineer    Labs for ITP Cardiac and Pulmonary Rehab Latest Ref Rng & Units 07/25/2019 07/29/2019 07/29/2019 07/29/2019 07/29/2019   Cholestrol 0 - 200 mg/dL - - - - -   LDLCALC 0 - 99 mg/dL - - - - -   HDL >39 mg/dL - - - - -   Trlycerides <150 mg/dL - - - - -   Hemoglobin A1c 4.8 - 5.6 % 6.4(H) - - - -   PHART 7.35 - 7.45 - 7.426 7.433 - -   PCO2ART 32 - 48 mmHg - 42.5 43.8 - -   HCO3 20.0 - 28.0 mmol/L - 27.5 29.3(H) - -   TCO2 22 - 32 mmol/L - - _0 O2SAT % - 95.3 100.0 - -       Exercise Target Goals: Exercise Program Goal: Individual exercise prescription set using results from initial 6 min walk test and THRR while considering  patients activity barriers and safety.    Exercise Prescription Goal: Initial exercise prescription builds to 30-45 minutes a day of aerobic activity, 2-3 days per week.  Home exercise guidelines  will be given to patient during program as part of exercise prescription that the participant will acknowledge.   Education: Aerobic Exercise & Resistance Training: - Gives group verbal and written instruction on the various components of exercise. Focuses on aerobic and resistive training programs and the benefits of this training and how to safely progress through these programs..   Education: Exercise & Equipment Safety: - Individual verbal instruction and demonstration of equipment use and safety with use of the equipment.   Cardiac Rehab from 08/19/2019 in [facility] Cardiac and Pulmonary Rehab  Date 08/12/19  Educator [facility]  Instruction Review Code 1- Verbalizes Understanding      Education: Exercise Physiology & General Exercise Guidelines: - Group verbal and written instruction with models to review the exercise physiology of the cardiovascular system and associated critical values. Provides general exercise guidelines with specific guidelines to those with heart or lung disease.    Cardiac Rehab from 08/19/2019 in [facility] Cardiac and Pulmonary Rehab  Date 08/19/19  Instruction Review Code 3- Needs Reinforcement  [need identified]      Education: Flexibility, Balance, Mind/Body Relaxation: Provides group verbal/written instruction on the benefits of flexibility and balance training, including mind/body exercise modes such as yoga, pilates and tai chi.  Demonstration and skill practice provided.   Activity Barriers & Risk Stratification:  Activity Barriers & Cardiac Risk Stratification - 08/19/19 1127      Activity Barriers & Cardiac Risk Stratification   Activity Barriers Arthritis;Back Problems;Deconditioning;Muscular Weakness;Balance Concerns   occasional back pain, arth in hands   Cardiac Risk Stratification High           6  Minute Walk:  6 Minute Walk    Row Name 08/19/19 1127         6 Minute Walk   Phase Initial     Distance 935 feet     Walk Time 6 minutes     # of Rest Breaks 0     MPH 1.77     METS 1.78     RPE 9     VO2 Peak 6.23     Symptoms No     Resting HR 74 bpm     Resting BP 130/64     Resting Oxygen Saturation  97 %     Exercise Oxygen Saturation  during 6 min walk 95 %     Max Ex. HR 97 bpm     Max Ex. BP 146/74     2 Minute Post BP 132/70            Oxygen Initial Assessment:   Oxygen Re-Evaluation:   Oxygen Discharge (Final Oxygen Re-Evaluation):   Initial Exercise Prescription:  Initial Exercise Prescription - 08/19/19 1100      Date of Initial Exercise RX and Referring Provider   Date 08/19/19    Referring Provider [name] MD      Treadmill   MPH 1.7    Grade 0    Minutes 15    METs 2.3      Recumbant Bike   Level 1    RPM 50    Watts 5    Minutes 15    METs 1.7      NuStep   Level 1    SPM 80    Minutes 15    METs 1.7      Prescription Details   Frequency (times per week) 2    Duration Progress to 30 minutes of continuous aerobic without signs/symptoms of  physical distress      Intensity   THRR 40-80% of Max Heartrate 112-127    Ratings of Perceived Exertion 11-13    Perceived Dyspnea 0-4      Progression   Progression Continue to progress workloads to maintain intensity without signs/symptoms of physical distress.      Resistance Training   Training Prescription Yes    Weight 3 lb    Reps 10-15           Perform Capillary Blood Glucose checks as needed.  Exercise Prescription Changes:  Exercise Prescription Changes    Row Name 08/19/19 1100 09/03/19 1700           Response to Exercise   Blood Pressure (Admit) 130/64 112/62      Blood Pressure (Exercise) 146/74 120/60      Blood Pressure (Exit) 132/70 118/68      Heart Rate (Admit) 74 bpm 87 bpm      Heart Rate (Exercise) 97 bpm 106 bpm      Heart Rate (Exit) 69  bpm 82 bpm      Oxygen Saturation (Admit) 97 % --      Oxygen Saturation (Exercise) 95 % --      Rating of Perceived Exertion (Exercise) 9 11      Symptoms none none      Comments walk test results --      Duration -- Progress to 30 minutes of  aerobic without signs/symptoms of physical distress      Intensity -- THRR unchanged        Progression   Progression -- Continue to progress workloads to maintain intensity without signs/symptoms of physical distress.      Average METs -- 2.36        Resistance Training   Training Prescription -- Yes      Weight -- 3 lb      Reps -- 10-15        Interval Training   Interval Training -- No        Recumbant Bike   Level -- 1      Watts -- 5      Minutes -- 15      METs -- 2.22        NuStep   Level -- 1      Minutes -- 15      METs -- 2.5             Exercise Comments:  Exercise Comments    Row Name 08/26/19 1140           Exercise Comments First full day of exercise!  Patient was oriented to gym and equipment including functions, settings, policies, and procedures.  Patient's individual exercise prescription and treatment plan were reviewed.  All starting workloads were established based on the results of the 6 minute walk test done at initial orientation visit.  The plan for exercise progression was also introduced and progression will be customized based on patient's performance and goals.              Exercise Goals and Review:  Exercise Goals    Row Name 08/19/19 1129             Exercise Goals   Increase Physical Activity Yes       Intervention Provide advice, education, support and counseling about physical activity/exercise needs.;Develop an individualized exercise prescription for aerobic and resistive training based on initial evaluation findings, risk stratification, comorbidities and  participant's personal goals.       Expected Outcomes Short Term: Attend rehab on a regular basis to increase amount of  physical activity.;Long Term: Add in home exercise to make exercise part of routine and to increase amount of physical activity.;Long Term: Exercising regularly at least 3-5 days a week.       Increase Strength and Stamina Yes       Intervention Provide advice, education, support and counseling about physical activity/exercise needs.;Develop an individualized exercise prescription for aerobic and resistive training based on initial evaluation findings, risk stratification, comorbidities and participant's personal goals.       Expected Outcomes Short Term: Increase workloads from initial exercise prescription for resistance, speed, and METs.;Short Term: Perform resistance training exercises routinely during rehab and add in resistance training at home;Long Term: Improve cardiorespiratory fitness, muscular endurance and strength as measured by increased METs and functional capacity (6MWT)       Able to understand and use rate of perceived exertion (RPE) scale Yes       Intervention Provide education and explanation on how to use RPE scale       Expected Outcomes Short Term: Able to use RPE daily in rehab to express subjective intensity level;Long Term:  Able to use RPE to guide intensity level when exercising independently       Able to understand and use Dyspnea scale Yes       Intervention Provide education and explanation on how to use Dyspnea scale       Expected Outcomes Short Term: Able to use Dyspnea scale daily in rehab to express subjective sense of shortness of breath during exertion;Long Term: Able to use Dyspnea scale to guide intensity level when exercising independently       Knowledge and understanding of Target Heart Rate Range (THRR) Yes       Intervention Provide education and explanation of THRR including how the numbers were predicted and where they are located for reference       Expected Outcomes Short Term: Able to state/look up THRR;Short Term: Able to use daily as guideline for  intensity in rehab;Long Term: Able to use THRR to govern intensity when exercising independently       Able to check pulse independently Yes       Intervention Provide education and demonstration on how to check pulse in carotid and radial arteries.;Review the importance of being able to check your own pulse for safety during independent exercise       Expected Outcomes Short Term: Able to explain why pulse checking is important during independent exercise;Long Term: Able to check pulse independently and accurately       Understanding of Exercise Prescription Yes       Intervention Provide education, explanation, and written materials on patient's individual exercise prescription       Expected Outcomes Long Term: Able to explain home exercise prescription to exercise independently;Short Term: Able to explain program exercise prescription              Exercise Goals Re-Evaluation :  Exercise Goals Re-Evaluation    [place] Name 08/26/19 1141 09/03/19 1751           Exercise Goal Re-Evaluation   Exercise Goals Review Able to understand and use rate of perceived exertion (RPE) scale;Knowledge and understanding of Target Heart Rate Range (THRR);Understanding of Exercise Prescription Able to understand and use rate of perceived exertion (RPE) scale;Knowledge and understanding of Target Heart Rate Range (THRR);Understanding of Exercise  Prescription      Comments Reviewed RPE and dyspnea scales, THR and program prescription with pt today.  Pt voiced understanding and was given a copy of goals to take home. Matheus started his first week here at rehab and has tolerated exercise well. RPE has been within appropriate ranges. Staff will continue to monitor.      Expected Outcomes Short: Use RPE daily to regulate intensity. Long: Follow program prescription in THR. Short: Continue attending rehab regularly Long: Increase strength/ stamina             Discharge Exercise Prescription (Final Exercise  Prescription Changes):  Exercise Prescription Changes - 09/03/19 1700      Response to Exercise   Blood Pressure (Admit) 112/62    Blood Pressure (Exercise) 120/60    Blood Pressure (Exit) 118/68    Heart Rate (Admit) 87 bpm    Heart Rate (Exercise) 106 bpm    Heart Rate (Exit) 82 bpm    Rating of Perceived Exertion (Exercise) 11    Symptoms none    Duration Progress to 30 minutes of  aerobic without signs/symptoms of physical distress    Intensity THRR unchanged      Progression   Progression Continue to progress workloads to maintain intensity without signs/symptoms of physical distress.    Average METs 2.36      Resistance Training   Training Prescription Yes    Weight 3 lb    Reps 10-15      Interval Training   Interval Training No      Recumbant Bike   Level 1    Watts 5    Minutes 15    METs 2.22      NuStep   Level 1    Minutes 15    METs 2.5           Nutrition:  Target Goals: Understanding of nutrition guidelines, daily intake of sodium <1551m, cholesterol <2076m calories 30% from fat and 7% or less from saturated fats, daily to have 5 or more servings of fruits and vegetables.  Education: Controlling Sodium/Reading Food Labels -Group verbal and written material supporting the discussion of sodium use in heart healthy nutrition. Review and explanation with models, verbal and written materials for utilization of the food label.   Education: General Nutrition Guidelines/Fats and Fiber: -Group instruction provided by verbal, written material, models and posters to present the general guidelines for heart healthy nutrition. Gives an explanation and review of dietary fats and fiber.   [facility]  Date 08/19/19  Instruction Review Code 3- Needs Reinforcement  [need identified]      Biometrics:  Pre Biometrics - 08/19/19 1132      Pre Biometrics   Height 5' 8.7" (1.745 m)    Weight 159 lb 4.8 oz  (72.3 kg)    BMI (Calculated) 23.73    Single Leg Stand 2.69 seconds            Nutrition Therapy Plan and Nutrition Goals:  Nutrition Therapy & Goals - 09/02/19 1134      Nutrition Therapy   Diet Heart healthy, low Na, T2DM    Protein (specify units) 55-60g    Fiber 30 grams    Whole Grain Foods 3 servings    Saturated Fats 12 max. grams    Fruits and Vegetables 5 servings/day    Sodium 1.5 grams      Personal Nutrition Goals   Nutrition Goal  ST: have a sandwich at home instead of going out to eat? add vegetables to lunch and breakfast, eat snack closer to rehab time or have larger snack to maintain BG during exercise  LT: eat in a way that helps his heart and BG    Comments Last A1C was around 6.6. BG ~120-135. No insulin.Drinks water and sometimes diet coke. B: Ham and egg, grits and dry wheat toast out, when home have a sweet toast and peanut butter and an apple or banana. Weekend - egg white omelette with ham on it and hashbrowns and dry wheat L: Eats lunch out at [employer], [employer] - chicken sandwich, subway wheat bread with [place] and lots of spinach S: boost. D: wife cooks chicken, green beans, potatoes, salads. Uses salt at the table (light salt), wife does not cook with it. Eats red meat 1x/week. Uses canola oil. Discussed heart healthy and T2DM eating.      Intervention Plan   Intervention Prescribe, educate and counsel regarding individualized specific dietary modifications aiming towards targeted core components such as weight, hypertension, lipid management, diabetes, heart failure and other comorbidities.;Nutrition handout(s) given to patient.    Expected Outcomes Short Term Goal: A plan has been developed with personal nutrition goals set during dietitian appointment.;Short Term Goal: Understand basic principles of dietary content, such as calories, fat, sodium, cholesterol and nutrients.;Long Term Goal: Adherence to prescribed nutrition plan.            Nutrition Assessments:  Nutrition Assessments - 08/20/19 0709      MEDFICTS Scores   Pre Score 56           MEDIFICTS Score Key:          ?70 Need to make dietary changes          40-70 Heart Healthy Diet         ? 40 Therapeutic Level Cholesterol Diet  Nutrition Goals Re-Evaluation:   Nutrition Goals Discharge (Final Nutrition Goals Re-Evaluation):   Psychosocial: Target Goals: Acknowledge presence or absence of significant depression and/or stress, maximize coping skills, provide positive support system. Participant is able to verbalize types and ability to use techniques and skills needed for reducing stress and depression.   Education: Depression - Provides group verbal and written instruction on the correlation between heart/lung disease and depressed mood, treatment options, and the stigmas associated with seeking treatment.   Education: Sleep Hygiene -Provides group verbal and written instruction about how sleep can affect your health.  Define sleep hygiene, discuss sleep cycles and impact of sleep habits. Review good sleep hygiene tips.     Education: Stress and Anxiety: - Provides group verbal and written instruction about the health risks of elevated stress and causes of high stress.  Discuss the correlation between heart/lung disease and anxiety and treatment options. Review healthy ways to manage with stress and anxiety.    Initial Review & Psychosocial Screening:  Initial Psych Review & Screening - 08/12/19 0838      Initial Review   Current issues with None Identified      Family Dynamics   Good Support System? Yes    Comments He can look to his wife, son and daughter for support. He states he is an overall positive person.      Barriers   Psychosocial barriers to participate in program The patient should benefit from training in stress management and relaxation.      Screening Interventions   Interventions Encouraged to exercise;Provide  feedback about the scores to participant;Program counselor consult;To provide support and resources with identified psychosocial needs    Expected Outcomes Short Term goal: Utilizing psychosocial counselor, staff and physician to assist with identification of specific Stressors or current issues interfering with healing process. Setting desired goal for each stressor or current issue identified.;Long Term Goal: Stressors or current issues are controlled or eliminated.;Short Term goal: Identification and review with participant of any Quality of Life or Depression concerns found by scoring the questionnaire.;Long Term goal: The participant improves quality of Life and PHQ9 Scores as seen by post scores and/or verbalization of changes           Quality of Life Scores:   Quality of Life - 08/20/19 0710      Quality of Life   Select Quality of Life      Quality of Life Scores   Health/Function Pre 29.46 %    Socioeconomic Pre 26.25 %    Psych/Spiritual Pre 29.14 %    Family Pre 28.8 %    GLOBAL Pre 28.69 %          Scores of 19 and below usually indicate a poorer quality of life in these areas.  A difference of  2-3 points is a clinically meaningful difference.  A difference of 2-3 points in the total score of the Quality of Life Index has been associated with significant improvement in overall quality of life, self-image, physical symptoms, and general health in studies assessing change in quality of life.  PHQ-9: Recent Review Flowsheet Data    Depression screen [facility] 2/9 08/19/2019   Decreased Interest 0   Down, Depressed, Hopeless 0   PHQ - 2 Score 0   Altered sleeping 0   Tired, decreased energy 0   Change in appetite 0   Feeling bad or failure about yourself  0   Trouble concentrating 0   Moving slowly or fidgety/restless 0   Suicidal thoughts 0   PHQ-9 Score 0   Difficult doing work/chores Not difficult at all     Interpretation of Total Score  Total Score Depression  Severity:  1-4 = Minimal depression, 5-9 = Mild depression, 10-14 = Moderate depression, 15-19 = Moderately severe depression, 20-27 = Severe depression   Psychosocial Evaluation and Intervention:  Psychosocial Evaluation - 08/12/19 0839      Psychosocial Evaluation & Interventions   Interventions Encouraged to exercise with the program and follow exercise prescription    Comments He can look to his wife, son and daughter for support. He states he is an overall positive person.    Expected Outcomes Short: Exercise regularly to support mental health and notify staff of any changes. Long: maintain mental health and well being through teaching of rehab or prescribed medications independently.    Continue Psychosocial Services  Follow up required by staff           Psychosocial Re-Evaluation:   Psychosocial Discharge (Final Psychosocial Re-Evaluation):   Vocational Rehabilitation: Provide vocational rehab assistance to qualifying candidates.   Vocational Rehab Evaluation & Intervention:   Education: Education Goals: Education classes will be provided on a variety of topics geared toward better understanding of heart health and risk factor modification. Participant will state understanding/return demonstration of topics presented as noted by education test scores.  Learning Barriers/Preferences:  Learning Barriers/Preferences - 08/12/19 6979      Learning Barriers/Preferences   Learning Barriers None    Learning Preferences None  General Cardiac Education Topics:  AED/CPR: - Group verbal and written instruction with the use of models to demonstrate the basic use of the AED with the basic ABC's of resuscitation.   Anatomy & Physiology of the Heart: - Group verbal and written instruction and models provide basic cardiac anatomy and physiology, with the coronary electrical and arterial systems. Review of Valvular disease and Heart Failure   Cardiac Rehab from  08/19/2019 in [facility] Cardiac and Pulmonary Rehab  Date 08/19/19  Instruction Review Code 3- Needs Reinforcement  [need identified]      Cardiac Procedures: - Group verbal and written instruction to review commonly prescribed medications for heart disease. Reviews the medication, class of the drug, and side effects. Includes the steps to properly store meds and maintain the prescription regimen. (beta blockers and nitrates)   Cardiac Medications I: - Group verbal and written instruction to review commonly prescribed medications for heart disease. Reviews the medication, class of the drug, and side effects. Includes the steps to properly store meds and maintain the prescription regimen.   Cardiac Medications II: -Group verbal and written instruction to review commonly prescribed medications for heart disease. Reviews the medication, class of the drug, and side effects. (all other drug classes)    Go Sex-Intimacy & Heart Disease, Get SMART - Goal Setting: - Group verbal and written instruction through game format to discuss heart disease and the return to sexual intimacy. Provides group verbal and written material to discuss and apply goal setting through the application of the S.M.A.R.T. Method.   Other Matters of the Heart: - Provides group verbal, written materials and models to describe Stable Angina and Peripheral Artery. Includes description of the disease process and treatment options available to the cardiac patient.   Infection Prevention: - Provides verbal and written material to individual with discussion of infection control including proper hand washing and proper equipment cleaning during exercise session.   Cardiac Rehab from 08/19/2019 in [facility] Cardiac and Pulmonary Rehab  Date 08/12/19  Educator [facility]  Instruction Review Code 1- Verbalizes Understanding      Falls Prevention: - Provides verbal and written material to individual with discussion of falls prevention and  safety.   Cardiac Rehab from 08/19/2019 in [facility] Cardiac and Pulmonary Rehab  Date 08/12/19  Educator [facility]  Instruction Review Code 1- Verbalizes Understanding      Other: -Provides group and verbal instruction on various topics (see comments)   Knowledge Questionnaire Score:  Knowledge Questionnaire Score - 08/20/19 0710      Knowledge Questionnaire Score   Pre Score 21/26 Education Focus: Nutrition, Exercise, Angina           Core Components/Risk Factors/Patient Goals at Admission:  Personal Goals and Risk Factors at Admission - 08/12/19 0837      Core Components/Risk Factors/Patient Goals on Admission    Weight Management Yes;Weight Gain    Intervention Weight Management: Develop a combined nutrition and exercise program designed to reach desired caloric intake, while maintaining appropriate intake of nutrient and fiber, sodium and fats, and appropriate energy expenditure required for the weight goal.;Weight Management: Provide education and appropriate resources to help participant work on and attain dietary goals.;Weight Management/Obesity: Establish reasonable short term and long term weight goals.    Expected Outcomes Short Term: Continue to assess and modify interventions until short term weight is achieved;Long Term: Adherence to nutrition and physical activity/exercise program aimed toward attainment of established weight goal;Weight Maintenance: Understanding of the daily nutrition guidelines, which includes 25-35%  calories from fat, 7% or less cal from saturated fats, less than 283m cholesterol, less than 1.5gm of sodium, & 5 or more servings of fruits and vegetables daily;Understanding recommendations for meals to include 15-35% energy as protein, 25-35% energy from fat, 35-60% energy from carbohydrates, less than 2070mof dietary cholesterol, 20-35 gm of total fiber daily;Understanding of distribution of calorie intake throughout the day with the consumption of 4-5  meals/snacks;Weight Gain: Understanding of general recommendations for a high calorie, high protein meal plan that promotes weight gain by distributing calorie intake throughout the day with the consumption for 4-5 meals, snacks, and/or supplements    Diabetes Yes    Intervention Provide education about proper nutrition, including hydration, and aerobic/resistive exercise prescription along with prescribed medications to achieve blood glucose in normal ranges: Fasting glucose 65-99 mg/dL;Provide education about signs/symptoms and action to take for hypo/hyperglycemia.    Expected Outcomes Short Term: Participant verbalizes understanding of the signs/symptoms and immediate care of hyper/hypoglycemia, proper foot care and importance of medication, aerobic/resistive exercise and nutrition plan for blood glucose control.;Long Term: Attainment of HbA1C < 7%.    Hypertension Yes    Intervention Provide education on lifestyle modifcations including regular physical activity/exercise, weight management, moderate sodium restriction and increased consumption of fresh fruit, vegetables, and low fat dairy, alcohol moderation, and smoking cessation.;Monitor prescription use compliance.    Expected Outcomes Short Term: Continued assessment and intervention until BP is < 140/9020mG in hypertensive participants. < 130/71m14m in hypertensive participants with diabetes, heart failure or chronic kidney disease.;Long Term: Maintenance of blood pressure at goal levels.    Lipids Yes    Intervention Provide education and support for participant on nutrition & aerobic/resistive exercise along with prescribed medications to achieve LDL <70mg40mL >40mg.62mExpected Outcomes Short Term: Participant states understanding of desired cholesterol values and is compliant with medications prescribed. Participant is following exercise prescription and nutrition guidelines.;Long Term: Cholesterol controlled with medications as prescribed,  with individualized exercise RX and with personalized nutrition plan. Value goals: LDL < 70mg, 8m> 40 mg.           Education:Diabetes - Individual verbal and written instruction to review signs/symptoms of diabetes, desired ranges of glucose level fasting, after meals and with exercise. Acknowledge that pre and post exercise glucose checks will be done for 3 sessions at entry of program.   [facility]  Date 08/12/19  [facility] Code 1- Verbalizes Understanding      Education: Know Your Numbers and Risk Factors: -Group verbal and written instruction about important numbers in your health.  Discussion of what are risk factors and how they play a role in the disease process.  Review of Cholesterol, Blood Pressure, Diabetes, and BMI and the role they play in your overall health.   Core Components/Risk Factors/Patient Goals Review:    Core Components/Risk Factors/Patient Goals at Discharge (Final Review):    ITP Comments:  ITP Comments    Row Name 08/12/19 0841 08/19/19 1127 08/26/19 1140 09/10/19 0758     ITP Comments Virtual Visit completed. Patient informed on EP and RD appointment and 6 Minute walk test. Patient also informed of patient health questionnaires on My Chart. Patient Verbalizes understanding. Visit diagnosis can be found in [facility]. Completed 6MWT and gym orientation. Initial ITP created and sent for review to Dr. Mark MiEmily Filbertal Director. First full day of exercise!  Patient was oriented  to gym and equipment including functions, settings, policies, and procedures.  Patient's individual exercise prescription and treatment plan were reviewed.  All starting workloads were established based on the results of the 6 minute walk test done at initial orientation visit.  The plan for exercise progression was also introduced and progression will be customized based on patient's performance and  goals. 30 Day review completed. Medical Director ITP review done, changes made as directed, and signed approval by Medical Director.           Comments:

## 2019-09-11 ENCOUNTER — Ambulatory Visit (HOSPITAL_COMMUNITY): Payer: Medicare Other | Attending: Cardiology

## 2019-09-11 ENCOUNTER — Ambulatory Visit (INDEPENDENT_AMBULATORY_CARE_PROVIDER_SITE_OTHER): Payer: Medicare Other | Admitting: Physician Assistant

## 2019-09-11 ENCOUNTER — Other Ambulatory Visit: Payer: Self-pay

## 2019-09-11 ENCOUNTER — Encounter: Payer: Self-pay | Admitting: Physician Assistant

## 2019-09-11 ENCOUNTER — Other Ambulatory Visit: Payer: Self-pay | Admitting: Physician Assistant

## 2019-09-11 VITALS — BP 120/82 | HR 79 | Ht 68.5 in | Wt 170.0 lb

## 2019-09-11 DIAGNOSIS — I5032 Chronic diastolic (congestive) heart failure: Secondary | ICD-10-CM

## 2019-09-11 DIAGNOSIS — Z952 Presence of prosthetic heart valve: Secondary | ICD-10-CM | POA: Diagnosis present

## 2019-09-11 DIAGNOSIS — I10 Essential (primary) hypertension: Secondary | ICD-10-CM | POA: Diagnosis not present

## 2019-09-11 DIAGNOSIS — I208 Other forms of angina pectoris: Secondary | ICD-10-CM | POA: Diagnosis not present

## 2019-09-11 DIAGNOSIS — Z9861 Coronary angioplasty status: Secondary | ICD-10-CM | POA: Diagnosis not present

## 2019-09-11 DIAGNOSIS — I2510 Atherosclerotic heart disease of native coronary artery without angina pectoris: Secondary | ICD-10-CM | POA: Diagnosis not present

## 2019-09-11 LAB — ECHOCARDIOGRAM COMPLETE
AR max vel: 1.51 cm2
AV Area VTI: 1.7 cm2
AV Area mean vel: 1.61 cm2
AV Mean grad: 8 mmHg
AV Peak grad: 12.3 mmHg
Ao pk vel: 1.75 m/s
Area-P 1/2: 2.01 cm2
S' Lateral: 2.5 cm

## 2019-09-11 MED ORDER — CLINDAMYCIN HCL 300 MG PO CAPS
ORAL_CAPSULE | ORAL | 8 refills | Status: DC
Start: 2019-09-11 — End: 2020-08-05

## 2019-09-11 NOTE — Progress Notes (Signed)
[place]                                       Cardiology Office Note    Date:  09/14/2019   ID:  Victor [name], Victor [name] 02/14/1931, MRN [id number]  PCP:  Sofie Hartigan, MD  Cardiologist: [name], MD/ Dr. [name] & Dr. Roxy Manns (TAVR)  CC: 1 month s/p TAVR  History of Present Illness:  Victor [name] is a 86 y.o. male with a history of arthritis, carotid artery disease, BPH, CAD, DM, GERD, HLD, HTN, diverticulitis s/p partial colectomyandcriticalaortic stenosiss/p TAVR (07/29/19) who presents to clinic for follow up.  Patient's cardiac history dates back to 2008 when he first was found to have coronary artery disease associated with abnormal stress test. He was treated with PCI and stenting of the right coronary artery at that time and has done well. He has been followed intermittently ever since by Dr. [name]. He has developed aortic stenosis which has gradually progressed in severity on follow-up echocardiographic imaging. Echocardiogram performed March 06, 2019 revealed severe aortic stenosis with peak velocity across aortic valve measured 4.4 m/s corresponding to mean transvalvular gradient estimated 42 mmHg and aortic valve area estimated 0.79 cm by VTI. The DVI was reported 0.23. Left ventricular systolic function remain normal. Repeat echocardiogram was performed Jun 12, 2019 and confirmed the presence of severe aortic stenosis with peak velocity across aortic valve measured greater than 4.5 m/s corresponding to mean transvalvular gradient estimated 52 mmHg and aortic valve area calculated only 0.49 cm by VTI. Left ventricular systolic function remain normal. Patient was seen in follow-up by [name] [place] Jun 18, 2019 at which time he complained of progressive symptoms of exertional shortness of breath and decreased energy. Diagnostic cardiac catheterization was performed Jun 25, 2019 and  confirmed the presence of severe aortic stenosis with peak to peak and mean transvalvular gradients measured 45 and 44 mmHg by catheterization, respectively.Patient was noted to have continuous patency of stent in the right coronary artery with otherwise mild nonobstructive coronary artery disease. Left ventricular systolic function remain normal and right heart pressures were normal.  He was evaluated by the multidisciplinary valve team and underwent successful TAVR with a104mm Edwards Sapien 3 THV via the TF approach on 07/29/19. Pt had some hypotension post operatively that required pressors and he was sent to the cardiac ICU. Post operativeshowed EF 60%, normally functioning TAVR with a mean gradient of 5 mm Hg and no PVL. He was started on asprin and plavix. At follow up he complained of fatigue and follow up labs were unremarkable.   Today he presents to clinic for follow up. No CP or SOB. No LE edema, orthopnea or PND. No dizziness or syncope. No blood in stool or urine. No palpitations. Has persistent fatigue that has not improved the way he had hoped after TAVR, but otherwise doing okay.    Past Medical History:  Diagnosis Date  . Angina   . Arthritis   . Bilateral carotid artery disease ([place])    CAROTID DOPPLER,05/21/2008 - Right and left ICA-0-49% diameter reduction, left CCA-0-49% diameter reductiion  . BPH (benign prostatic hypertrophy)   . CAD S/P percutaneous coronary angioplasty 07/2006   PCI to RCA - Promus DES 2.5 mm x 23 mm; 2D ECHO - EF >55%, moderate calcification of the aortic valve  leaflets  . Diabetes mellitus   . Diverticulitis    s/p colectomy  . GERD (gastroesophageal reflux disease)   . High cholesterol   . History of kidney stones    per patient, "a very long time ago and it passed by itself"  . Hypertension    "from the diabetes"  . 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 Severe aortic stenosis   . Squamous cell skin cancer, nasal tip     Past Surgical History:  Procedure Laterality Date  . CARDIAC CATHETERIZATION with PCI  08/27/2006   RCA-mid - 2.5x72mm Promus stent  . CATARACT EXTRACTION W/ INTRAOCULAR LENS  IMPLANT, BILATERAL    . CATARACT EXTRACTION W/PHACO Right 03/08/2015   Procedure: CATARACT EXTRACTION PHACO AND INTRAOCULAR LENS PLACEMENT (IOC);  Surgeon: Estill Cotta, MD;  Location: [facility] ORS;  Service: Ophthalmology;  Laterality: Right;  [place]: 01:29.4   . COLECTOMY  ~ 2000  . LEFT HEART CATHETERIZATION WITH CORONARY ANGIOGRAM N/A 03/22/2011   Procedure: LEFT HEART CATHETERIZATION WITH CORONARY ANGIOGRAM;  Surgeon: Leonie Man, MD;  Location: [facility] CATH LAB;  Service: Cardiovascular::: Patent RCA stent w/ progression of pRCA Dz to ~50-60%. Progression of oD3 lesion to 60-70% - not optimal for PCI (b/c ostial).  EF 55-60% - no RWMA. Med Rx.  Aortic valve gradient: Peak 22 mmHg, mean 13 mmHg  . NM MYOVIEW LTD  08/2014   LOW RISK. NORMAL.  EF 45-54%.   Marland Kitchen RIGHT/LEFT HEART CATH AND CORONARY ANGIOGRAPHY N/A 06/25/2019   Procedure: RIGHT/LEFT HEART CATH AND CORONARY ANGIOGRAPHY;  Surgeon: Leonie Man, MD;  Location: [place] CV LAB;  Service: Cardiovascular;  Laterality: N/A;  . TEE WITHOUT CARDIOVERSION N/A 07/29/2019   Procedure: TRANSESOPHAGEAL ECHOCARDIOGRAM (TEE);  Surgeon: Burnell Blanks, MD;  Location: [place] CV LAB;  Service: Open Heart Surgery;  Laterality: N/A;  . TRANSCATHETER AORTIC VALVE REPLACEMENT, TRANSFEMORAL N/A 07/29/2019   Procedure: TRANSCATHETER AORTIC VALVE REPLACEMENT, TRANSFEMORAL;  Surgeon: Burnell Blanks, MD;  Location: [place] CV LAB;  Service: Open Heart Surgery;  Laterality: N/A;  . TRANSTHORACIC ECHOCARDIOGRAM  04/2017   Normal LV size and function.  EF 60-65%.  GR 1 DD.  Moderate aortic stenosis with estimated valve area between 0.88-0.97 cm.  (Mean gradient 25  mmHg, peak gradient 43 mmHg)  . TRANSTHORACIC ECHOCARDIOGRAM  08/26/2015   Normal LV size with mild concentric hypertrophy. Normal EF 55-60%. No RWMA. GR 1 DD. Severe aortic stenosis (mean gradient 24 mmHg, AVA 0.75 cm) --> progression from 2016. Mild-moderate MR. Mildly dilated RV. Mildly elevated PA pressures (40 mmHg.  Marland Kitchen TRANSTHORACIC ECHOCARDIOGRAM  06/2016   EF 60-65%. GR 1 DD. Moderate to severe aortic stenosis with a mean gradient 30 mmHg, peak gradient of 57 mmHg.   Marland Kitchen TRANSURETHRAL RESECTION OF PROSTATE  ~ 2010    Current Medications: Outpatient Medications Prior to Visit  Medication Sig Dispense Refill  . acetaminophen (TYLENOL) 500 MG tablet Take 500 mg by mouth every 6 (six) hours as needed for moderate pain or headache.    . Artificial Tear Solution (GENTEAL TEARS OP) Place 1 drop into both eyes daily.    Marland Kitchen ascorbic acid (VITAMIN C) 500 MG tablet Take 1,000-1,500 mg by mouth See admin instructions. Take 1000 mg in the morning and 1500 mg at night    . aspirin EC 81 MG  tablet Take 81 mg by mouth daily.    . benzonatate (TESSALON) 200 MG capsule Take 200 mg by mouth 3 (three) times daily as needed for cough.    . bismuth subsalicylate (PEPTO BISMOL) 262 MG/15ML suspension Take 30 mLs by mouth every 6 (six) hours as needed for indigestion or diarrhea or loose stools.    . calcium carbonate (OS-CAL) 600 MG TABS tablet Take 600 mg by mouth daily.    . cetirizine (ZYRTEC) 10 MG tablet Take 10 mg by mouth daily.    . Cholecalciferol (VITAMIN D3) 2000 UNITS TABS Take 4,000 Units by mouth 2 (two) times daily.     Marland Kitchen CINNAMON PO Take 1,000 mg by mouth 2 (two) times daily.    . clopidogrel (PLAVIX) 75 MG tablet Take 1 tablet (75 mg total) by mouth daily with breakfast. 90 tablet 1  . Coenzyme Q10 (COQ-10) 100 MG CAPS Take 100 mg by mouth daily.    . Cyanocobalamin (B-12) 2500 MCG TABS Take 2,500 mcg by mouth daily.    Marland Kitchen donepezil (ARICEPT) 10 MG tablet Take 10 mg by mouth daily.    Marland Kitchen EPIPEN  2-PAK 0.3 MG/0.3ML SOAJ injection Inject 0.3 mLs as directed as needed for anaphylaxis.     Marland Kitchen ezetimibe (ZETIA) 10 MG tablet Take 10 mg by mouth daily.    . fluticasone (FLONASE) 50 MCG/ACT nasal spray Place 2 sprays into both nostrils 2 (two) times daily as needed for allergies.     Marland Kitchen gabapentin (NEURONTIN) 100 MG capsule Take 300 mg by mouth at bedtime.     Marland Kitchen glipiZIDE (GLUCOTROL XL) 2.5 MG 24 hr tablet Take 2.5 mg by mouth daily with breakfast.    . Glucosamine HCl 1000 MG TABS Take 1,000 mg by mouth 2 (two) times daily.    Marland Kitchen ipratropium (ATROVENT) 0.03 % nasal spray Place 2 sprays into both nostrils 2 (two) times daily as needed for rhinitis.    Marland Kitchen loperamide (IMODIUM A-D) 2 MG tablet Take 2 mg by mouth daily as needed for diarrhea or loose stools.    . methocarbamol (ROBAXIN) 500 MG tablet Take 500 mg by mouth at bedtime as needed for muscle spasms.    . metoprolol succinate (TOPROL-XL) 25 MG 24 hr tablet Take 1 tablet by mouth once daily 90 tablet 3  . Multiple Vitamin (MULITIVITAMIN WITH MINERALS) TABS Take 1 tablet by mouth 2 (two) times daily.     . Omega 3 1200 MG CAPS Take 2,400 mg by mouth 2 (two) times daily.     . Probiotic CAPS Take 1 capsule by mouth daily.    . pseudoephedrine (SUDAFED) 30 MG tablet Take 60 mg by mouth every 4 (four) hours as needed for congestion.    . Saw Palmetto 450 MG CAPS Take 900 mg by mouth daily.     . simvastatin (ZOCOR) 40 MG tablet Take 40 mg by mouth at bedtime.    . triamcinolone cream (KENALOG) 0.1 % Apply 1 application topically 3 (three) times daily as needed for itching.    . zinc gluconate 50 MG tablet Take 50 mg by mouth daily.    . clindamycin (CLEOCIN) 300 MG capsule Take 2 tablets by mouth 1 hour prior to dental work 2 capsule 3   No facility-administered medications prior to visit.     Allergies:   Azithromycin, Cephalosporins, Codeine, Doxycycline, Phenylephrine-guaifenesin, Pseudoephedrine, Amoxicillin, Levofloxacin, Penicillins, Sulfa  antibiotics, and Sulfasalazine   Social History   Socioeconomic History  . Marital  status: Married    Spouse name: Not on file  . Number of children: 2  . Years of education: Not on file  . Highest education level: Not on file  Occupational History  . Occupation: Proofreader  Tobacco Use  . Smoking status: Never Smoker  . Smokeless tobacco: Former Systems developer    Types: Secondary school teacher  . Vaping Use: Never used  Substance and Sexual Activity  . Alcohol use: Yes    Alcohol/week: 6.0 standard drinks    Types: 6 Glasses of wine per week  . Drug use: No  . Sexual activity: Yes  Other Topics Concern  . Not on file  Social History Narrative   Father of 2, grandfather 32.   Exercise for almost 2 hours a day, doing least 20 minutes on the elliptical trainer. He does his to 4 days a week. He'll also does weights and stretching exercises.   Social Determinants of Health   Financial Resource Strain:   . Difficulty of Paying Living Expenses:   Food Insecurity:   . Worried About Charity fundraiser in the Last Year:   . Arboriculturist in the Last Year:   Transportation Needs:   . Film/video editor (Medical):   Marland Kitchen Lack of Transportation (Non-Medical):   Physical Activity:   . Days of Exercise per Week:   . Minutes of Exercise per Session:   Stress:   . Feeling of Stress :   Social Connections:   . Frequency of Communication with Friends and Family:   . Frequency of Social Gatherings with Friends and Family:   . Attends Religious Services:   . Active Member of Clubs or Organizations:   . Attends Archivist Meetings:   Marland Kitchen Marital Status:      Family History:  The patient's family history includes ALS in his mother; Prostate cancer in his father.     ROS:   Please see the history of present illness.    ROS All other systems reviewed and are negative.   PHYSICAL EXAM:   VS:  BP 120/82   Pulse 79   Ht 5' 8.5" (1.74 m)   Wt 170 lb (77.1 kg)   SpO2 93%    BMI 25.47 kg/m    GEN: Well nourished, well developed, in no acute distress HEENT: normal Neck: no JVD or masses Cardiac: RRR; no murmurs, rubs, or gallops,no edema  Respiratory:  clear to auscultation bilaterally, normal work of breathing GI: soft, nontender, nondistended, + BS MS: no deformity or atrophy Skin: warm and dry, no rash   Neuro:  Alert and Oriented x 3, Strength and sensation are intact Psych: euthymic mood, full affect   Wt Readings from Last 3 Encounters:  09/11/19 170 lb (77.1 kg)  08/19/19 159 lb 4.8 oz (72.3 kg)  08/07/19 159 lb 6.4 oz (72.3 kg)      Studies/Labs Reviewed:   EKG:  EKG is NOT ordered today.  Recent Labs: 07/25/2019: ALT 31; B Natriuretic Peptide 216.2 07/30/2019: Magnesium 1.9 08/07/2019: BUN 19; Creatinine, Ser 0.88; Hemoglobin 13.8; Platelets 172; Potassium 4.7; Sodium 141; TSH 3.780   Lipid Panel    Component Value Date/Time   CHOL 127 03/22/2011 0545   TRIG 133 03/22/2011 0545   HDL 48 03/22/2011 0545   CHOLHDL 2.6 03/22/2011 0545   VLDL 27 03/22/2011 0545   LDLCALC 52 03/22/2011 0545    Additional studies/ records that were reviewed today include:  TAVR OPERATIVE  NOTE   Date of Procedure:07/29/2019  Preoperative Diagnosis:Severe Aortic Stenosis   Postoperative Diagnosis:Same   Procedure:   Transcatheter Aortic Valve Replacement - PercutaneousRightTransfemoral Approach Edwards Sapien 3 THV (size 37mm, model # 9600TFX, serial #[id number])  Co-Surgeons:Christopher [name], MD andClarence H. Roxy Manns, MD   Anesthesiologist:William Therisa Doyne, MD  Echocardiographer:Mihai Croitoru, MD  Pre-operative Echo Findings: ? Severe aortic stenosis ? Normalleft ventricular systolic function  Post-operative Echo Findings: ? Noparavalvular leak ? Normalleft ventricular systolic  function   ____________________   Echo6/30/21: IMPRESSIONS 1. Left ventricular ejection fraction, by estimation, is 60 to 65%. The  left ventricle has normal function. The left ventricle has no regional  wall motion abnormalities. There is mild concentric left ventricular  hypertrophy. Left ventricular diastolic  function could not be evaluated.  2. Right ventricular systolic function is normal. The right ventricular  size is normal. Tricuspid regurgitation signal is inadequate for assessing  PA pressure.  3. Left atrial size was moderately dilated.  4. The mitral valve is degenerative. Mild to moderate mitral valve  regurgitation.  5. There is no perivalvular leak. The aortic valve has been  repaired/replaced. Aortic valve regurgitation is not visualized. There is  a Transport planner prosthetic (TAVR) valve present in the aortic position.  Procedure Date: 07/29/2019. Echo findings are  consistent with normal structure and function of the aortic valve  prosthesis. Aortic valve mean gradient measures 5.0 mmHg. Aortic valve  Vmax measures 1.66 m/s.  Comparison(s): No significant change from prior study. Prior images  reviewed side by side.  ________________   Echo 09/11/19 IMPRESSIONS    1. Left ventricular ejection fraction, by estimation, is 60 to 65%. The  left ventricle has normal function. The left ventricle has no regional  wall motion abnormalities. There is mild concentric left ventricular  hypertrophy. Left ventricular diastolic  parameters are consistent with Grade I diastolic dysfunction (impaired  relaxation). Elevated left ventricular end-diastolic pressure.  2. Right ventricular systolic function is normal. The right ventricular  size is normal. There is normal pulmonary artery systolic pressure. The  estimated right ventricular systolic pressure is 45.3 mmHg.  3. Left atrial size was moderately dilated.  4. The mitral valve is degenerative. Mild  to moderate mitral valve  regurgitation. No evidence of mitral stenosis.  5. Tricuspid valve regurgitation is mild to moderate.  6. The aortic valve has been repaired/replaced. Aortic valve  regurgitation is trivial. There is a 29 mm Edwards Sapien prosthetic  (TAVR) valve present in the aortic position. Procedure Date: 07/29/2019.  Echo findings are consistent with normal structure  and function of the aortic valve prosthesis. Aortic valve area, by VTI  measures 1.70 cm. Aortic valve mean gradient measures 8.0 mmHg. Aortic  valve Vmax measures 1.75 m/s.  7. The inferior vena cava is normal in size with greater than 50%  respiratory variability, suggesting right atrial pressure of 3 mmHg.   Comparison(s): No significant change from prior study.      ASSESSMENT & PLAN:   Severe AS s/p TAVR: echo today shows EF is normal, mild-mod MR, mild-mod TR, normally functioning TAVR with a mean gradient of 8 mm hg and no PVL. He has clindamycin for SBE prophylaxis (clinda no longer recommended due to increased risk of C. Dif, but allergic to all other options). He has NYHA class II symptoms; he complains mostly of fatigue that proceeded TAVR and there has been no improvement since surgery. Continue aspirin and plavix. He can stop plavix after 6 months of therapy (  01/2020). I will see him back in 1 year for follow up with echo.   HTN:Bp well controlled. No changes made.   CAD: pre TAVR cath showed stable coronary arteries with 50 to 60% proximal RCA followed by brief ecstatic segment and then widely patent mid stent. Otherwise, small caliber 1st Diag ~70% ostial stenosis.Continue medical therapy.   Chronic diastolic CHF: appears euvolemic off diuretics.    Medication Adjustments/Labs and Tests Ordered: Current medicines are reviewed at length with the patient today.  Concerns regarding medicines are outlined above.  Medication changes, Labs and Tests ordered today are listed in the  Patient Instructions below. Patient Instructions  Medication Instructions:  1) You may STOP PLAVIX 6 months from your TAVR (around 01/28/2020) 2) Please take probiotics with your CLINDAMYCIN when you take it for your dental visits. *If you need a refill on your cardiac medications before your next appointment, please call your pharmacy*  Follow-Up: You are scheduled for a visit with Dr. [name] on 01/13/2020 at 10:40AM.  We will call you to arrange your 1 year TAVR echo and office visit.     Signed, [name], PA-C  09/14/2019 6:06 AM    [facility] [place], [place], [place]  [address] Phone: [phone]; Fax: [phone]

## 2019-09-11 NOTE — Patient Instructions (Addendum)
Medication Instructions:  1) You may STOP PLAVIX 6 months from your TAVR (around 01/28/2020) 2) Please take probiotics with your CLINDAMYCIN when you take it for your dental visits. *If you need a refill on your cardiac medications before your next appointment, please call your pharmacy*  Follow-Up: You are scheduled for a visit with Dr. [name] on 01/13/2020 at 10:40AM.  We will call you to arrange your 1 year TAVR echo and office visit.

## 2019-09-15 ENCOUNTER — Encounter: Payer: Self-pay | Admitting: Podiatry

## 2019-09-15 ENCOUNTER — Ambulatory Visit (INDEPENDENT_AMBULATORY_CARE_PROVIDER_SITE_OTHER): Payer: Medicare Other | Admitting: Podiatry

## 2019-09-15 ENCOUNTER — Other Ambulatory Visit: Payer: Self-pay

## 2019-09-15 DIAGNOSIS — B351 Tinea unguium: Secondary | ICD-10-CM | POA: Diagnosis not present

## 2019-09-15 DIAGNOSIS — E1159 Type 2 diabetes mellitus with other circulatory complications: Secondary | ICD-10-CM

## 2019-09-15 DIAGNOSIS — E1169 Type 2 diabetes mellitus with other specified complication: Secondary | ICD-10-CM | POA: Diagnosis not present

## 2019-09-15 DIAGNOSIS — M79676 Pain in unspecified toe(s): Secondary | ICD-10-CM | POA: Diagnosis not present

## 2019-09-15 DIAGNOSIS — D689 Coagulation defect, unspecified: Secondary | ICD-10-CM

## 2019-09-15 DIAGNOSIS — E669 Obesity, unspecified: Secondary | ICD-10-CM | POA: Insufficient documentation

## 2019-09-15 DIAGNOSIS — I10 Essential (primary) hypertension: Secondary | ICD-10-CM

## 2019-09-15 NOTE — Progress Notes (Signed)
This patient returns to my office for at risk foot care.  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  Patient is taking plavix.  Patient has been in the hospital for heart surgery. This patient is unable to cut nails himself since the patient cannot reach his nails.These nails are painful walking and wearing shoes.  This patient presents for at risk foot care today.  General Appearance  Alert, conversant and in no acute stress.  Vascular  Dorsalis pedis and posterior tibial  pulses are weakly palpable  bilaterally.  Capillary return is within normal limits  bilaterally. Temperature is within normal limits  bilaterally.  Neurologic  Senn-Weinstein monofilament wire test within normal limits  bilaterally. Muscle power within normal limits bilaterally.  Nails Thick disfigured discolored nails with subungual debris  from hallux to fifth toes bilaterally. No evidence of bacterial infection or drainage bilaterally.  Orthopedic  No limitations of motion  feet .  No crepitus or effusions noted.  No bony pathology or digital deformities noted. Midfoot  DJD  B/L.  Skin  normotropic skin with no porokeratosis noted bilaterally.  No signs of infections or ulcers noted.     Onychomycosis  Pain in right toes  Pain in left toes  Consent was obtained for treatment procedures.   Mechanical debridement of nails 1-5  bilaterally performed with a nail nipper.  Filed with dremel without incident. Padding disopensed for second toe right foot.   Return office visit   3 months                   Told patient to return for periodic foot care and evaluation due to potential at risk complications.   Gardiner Barefoot DPM

## 2019-09-16 ENCOUNTER — Encounter: Payer: Medicare Other | Admitting: *Deleted

## 2019-09-16 DIAGNOSIS — Z952 Presence of prosthetic heart valve: Secondary | ICD-10-CM

## 2019-09-16 NOTE — Progress Notes (Signed)
Daily Session Note  Patient Details  Name: Victor [name] MRN: [id number] Date of Birth: July 21, 1931 Referring Provider:     Cardiac Rehab from 08/19/2019 in [facility] Cardiac and Pulmonary Rehab  Referring Provider [name] MD      Encounter Date: 09/16/2019  Check In:  Session Check In - 09/16/19 1156      Check-In   Supervising physician immediately available to respond to emergencies See telemetry face sheet for immediately available ER MD    Location [facility]-Cardiac & Pulmonary Rehab    Staff Present Heath Lark, RN, BSN, CCRP;Melissa [place] RDN, LDN;Joseph [employer], [place], ACSM CEP, Exercise Physiologist    Virtual Visit No    Medication changes reported     No    Fall or balance concerns reported    No    Warm-up and Cool-down Performed on first and last piece of equipment    Resistance Training Performed Yes    VAD Patient? No    PAD/SET Patient? No      Pain Assessment   Currently in Pain? No/denies              Social History   Tobacco Use  Smoking Status Never Smoker  Smokeless Tobacco Former Systems developer  . Types: Chew    Goals Met:  Independence with exercise equipment Exercise tolerated well No report of cardiac concerns or symptoms  Goals Unmet:  Not Applicable  Comments: Pt able to follow exercise prescription today without complaint.  Will continue to monitor for progression.    Dr. Emily Filbert is Medical Director for [place] and [facility].

## 2019-09-18 ENCOUNTER — Encounter: Payer: Medicare Other | Admitting: *Deleted

## 2019-09-18 ENCOUNTER — Other Ambulatory Visit: Payer: Self-pay

## 2019-09-18 DIAGNOSIS — Z952 Presence of prosthetic heart valve: Secondary | ICD-10-CM | POA: Diagnosis not present

## 2019-09-18 NOTE — Progress Notes (Signed)
Daily Session Note  Patient Details  Name: Victor [name] MRN: [id number] Date of Birth: [birth year] Referring Provider:     Cardiac Rehab from 08/19/2019 in [facility] Cardiac and Pulmonary Rehab  Referring Provider [name] MD      Encounter Date: 09/18/2019  Check In:  Session Check In - 09/18/19 1159      Check-In   Supervising physician immediately available to respond to emergencies See telemetry face sheet for immediately available ER MD    Location [facility]-Cardiac & Pulmonary Rehab    Staff Present Heath Lark, RN, BSN, CCRP;Melissa [place] RDN, LDN;Joseph [employer], BA, ACSM CEP, Exercise Physiologist;Jessica [place], MA, RCEP, CCRP, CCET    Virtual Visit No    Medication changes reported     No    Fall or balance concerns reported    No    Warm-up and Cool-down Performed on first and last piece of equipment    Resistance Training Performed Yes    VAD Patient? No    PAD/SET Patient? No      Pain Assessment   Currently in Pain? No/denies              Social History   Tobacco Use  Smoking Status Never Smoker  Smokeless Tobacco Former Systems developer  . Types: Chew    Goals Met:  Independence with exercise equipment Exercise tolerated well No report of cardiac concerns or symptoms  Goals Unmet:  Not Applicable  Comments: Pt able to follow exercise prescription today without complaint.  Will continue to monitor for progression.    Dr. Emily Filbert is Medical Director for [place] and [facility].

## 2019-09-23 ENCOUNTER — Other Ambulatory Visit: Payer: Self-pay

## 2019-09-23 ENCOUNTER — Encounter: Payer: Medicare Other | Admitting: *Deleted

## 2019-09-23 DIAGNOSIS — Z952 Presence of prosthetic heart valve: Secondary | ICD-10-CM

## 2019-09-23 NOTE — Progress Notes (Signed)
Daily Session Note  Patient Details  Name: Victor [name] MRN: [id number] Date of Birth: Jun 03, 1931 Referring Provider:     Cardiac Rehab from 08/19/2019 in [facility] Cardiac and Pulmonary Rehab  Referring Provider [name] MD      Encounter Date: 09/23/2019  Check In:  Session Check In - 09/23/19 1157      Check-In   Supervising physician immediately available to respond to emergencies See telemetry face sheet for immediately available ER MD    Location [facility]-Cardiac & Pulmonary Rehab    Staff Present Heath Lark, RN, BSN, CCRP;Melissa Thibodaux RDN, Rowe Pavy, BA, ACSM CEP, Exercise Physiologist;Kara Eliezer Bottom, MS Exercise Physiologist    Virtual Visit No    Medication changes reported     No    Fall or balance concerns reported    No    Warm-up and Cool-down Performed on first and last piece of equipment    Resistance Training Performed Yes    VAD Patient? No    PAD/SET Patient? No      Pain Assessment   Currently in Pain? No/denies              Social History   Tobacco Use  Smoking Status Never Smoker  Smokeless Tobacco Former Systems developer  . Types: Chew    Goals Met:  Independence with exercise equipment Exercise tolerated well No report of cardiac concerns or symptoms  Goals Unmet:  Not Applicable  Comments: Pt able to follow exercise prescription today without complaint.  Will continue to monitor for progression.    Dr. Emily Filbert is Medical Director for [place] and [facility].

## 2019-09-25 ENCOUNTER — Encounter: Payer: Medicare Other | Admitting: *Deleted

## 2019-09-25 ENCOUNTER — Other Ambulatory Visit: Payer: Self-pay

## 2019-09-25 DIAGNOSIS — Z952 Presence of prosthetic heart valve: Secondary | ICD-10-CM

## 2019-09-25 NOTE — Progress Notes (Signed)
Daily Session Note  Patient Details  Name: Victor [name] MRN: [id number] Date of Birth: 22-Sep-1931 Referring Provider:     Cardiac Rehab from 08/19/2019 in [facility] Cardiac and Pulmonary Rehab  Referring Provider [name] MD      Encounter Date: 09/25/2019  Check In:  Session Check In - 09/25/19 1045      Check-In   Supervising physician immediately available to respond to emergencies See telemetry face sheet for immediately available ER MD    Location [facility]-Cardiac & Pulmonary Rehab    Staff Present Renita Papa, RN BSN;Jessica Stilwell, MA, RCEP, CCRP, Marylynn Pearson, MS Exercise Physiologist;Susanne Bice, RN, BSN, CCRP;Melissa Caiola RDN, Rowe Pavy, BA, ACSM CEP, Exercise Physiologist    Virtual Visit No    Medication changes reported     No    Fall or balance concerns reported    No    Warm-up and Cool-down Performed on first and last piece of equipment    Resistance Training Performed Yes    VAD Patient? No    PAD/SET Patient? No      Pain Assessment   Currently in Pain? No/denies              Social History   Tobacco Use  Smoking Status Never Smoker  Smokeless Tobacco Former Systems developer  . Types: Chew    Goals Met:  Independence with exercise equipment Exercise tolerated well No report of cardiac concerns or symptoms Strength training completed today  Goals Unmet:  Not Applicable  Comments: Pt able to follow exercise prescription today without complaint.  Will continue to monitor for progression.    Dr. Emily Filbert is Medical Director for [place] and [facility].

## 2019-09-30 ENCOUNTER — Other Ambulatory Visit: Payer: Self-pay

## 2019-09-30 ENCOUNTER — Encounter: Payer: Medicare Other | Admitting: *Deleted

## 2019-09-30 DIAGNOSIS — Z952 Presence of prosthetic heart valve: Secondary | ICD-10-CM

## 2019-09-30 NOTE — Progress Notes (Signed)
Daily Session Note  Patient Details  Name: Victor [name] MRN: [id number] Date of Birth: May 17, 1931 Referring Provider:     Cardiac Rehab from 08/19/2019 in [facility] Cardiac and Pulmonary Rehab  Referring Provider [name] MD      Encounter Date: 09/30/2019  Check In:  Session Check In - 09/30/19 1147      Check-In   Supervising physician immediately available to respond to emergencies See telemetry face sheet for immediately available ER MD    Location [facility]-Cardiac & Pulmonary Rehab    Staff Present Heath Lark, RN, BSN, CCRP;Joseph Hood RCP,RRT,BSRT;Melissa Barnard RDN, LDN    Virtual Visit No    Medication changes reported     No    Fall or balance concerns reported    No    Warm-up and Cool-down Performed on first and last piece of equipment    Resistance Training Performed Yes    VAD Patient? No    PAD/SET Patient? No      Pain Assessment   Currently in Pain? No/denies              Social History   Tobacco Use  Smoking Status Never Smoker  Smokeless Tobacco Former Systems developer  . Types: Chew    Goals Met:  Independence with exercise equipment Exercise tolerated well No report of cardiac concerns or symptoms  Goals Unmet:  Not Applicable  Comments: Pt able to follow exercise prescription today without complaint.  Will continue to monitor for progression.    Dr. Emily Filbert is Medical Director for [place] and [facility].

## 2019-10-02 ENCOUNTER — Other Ambulatory Visit: Payer: Self-pay

## 2019-10-02 ENCOUNTER — Encounter: Payer: Medicare Other | Attending: Cardiology | Admitting: *Deleted

## 2019-10-02 DIAGNOSIS — Z952 Presence of prosthetic heart valve: Secondary | ICD-10-CM | POA: Diagnosis not present

## 2019-10-02 NOTE — Progress Notes (Signed)
Daily Session Note  Patient Details  Name: Victor [name] MRN: [id number] Date of Birth: April 12, 1931 Referring Provider:     Cardiac Rehab from 08/19/2019 in [facility] Cardiac and Pulmonary Rehab  Referring Provider Victor Hew MD      Encounter Date: 10/02/2019  Check In:  Session Check In - 10/02/19 Victor [name]      Check-In   Supervising physician immediately available to respond to emergencies See telemetry face sheet for immediately available ER MD    Location [facility]-Cardiac & Pulmonary Rehab    Staff Present Victor Papa, RN BSN;Victor Bradford, MA, RCEP, CCRP, Victor Pearson, MS Exercise Physiologist;Victor [name] [place] RDN, Victor [name], BA, ACSM CEP, Exercise Physiologist    Virtual Visit No    Medication changes reported     No    Fall or balance concerns reported    No    Warm-up and Cool-down Performed on first and last piece of equipment    Resistance Training Performed Yes    VAD Patient? No    PAD/SET Patient? No      Pain Assessment   Currently in Pain? No/denies              Social History   Tobacco Use  Smoking Status Never Smoker  Smokeless Tobacco Former Systems developer  . Types: Chew    Goals Met:  Independence with exercise equipment Exercise tolerated well No report of cardiac concerns or symptoms Strength training completed today  Goals Unmet:  Not Applicable  Comments: Pt able to follow exercise prescription today without complaint.  Will continue to monitor for progression.    Dr. Emily [name] is Medical Director for [place] and [facility].

## 2019-10-08 ENCOUNTER — Encounter: Payer: Self-pay | Admitting: *Deleted

## 2019-10-08 DIAGNOSIS — Z952 Presence of prosthetic heart valve: Secondary | ICD-10-CM

## 2019-10-08 NOTE — Progress Notes (Signed)
Cardiac Individual Treatment Plan  Patient Details  Name: Victor [name] MRN: [id number] Date of Birth: [birth year] Referring Provider:     Cardiac Rehab from 08/19/2019 in [facility] Cardiac and Pulmonary Rehab  Referring Provider [name] MD      Initial Encounter Date:    Cardiac Rehab from 08/19/2019 in [facility] Cardiac and Pulmonary Rehab  Date 08/19/19      Visit Diagnosis: S/P TAVR (transcatheter aortic valve replacement)  Patient's Home Medications on Admission:  Current Outpatient Medications:  .  acetaminophen (TYLENOL) 500 MG tablet, Take 500 mg by mouth every 6 (six) hours as needed for moderate pain or headache., Disp: , Rfl:  .  Artificial Tear Solution (GENTEAL TEARS OP), Place 1 drop into both eyes daily., Disp: , Rfl:  .  ascorbic acid (VITAMIN C) 500 MG tablet, Take 1,000-1,500 mg by mouth See admin instructions. Take 1000 mg in the morning and 1500 mg at night, Disp: , Rfl:  .  aspirin EC 81 MG tablet, Take 81 mg by mouth daily., Disp: , Rfl:  .  benzonatate (TESSALON) 200 MG capsule, Take 200 mg by mouth 3 (three) times daily as needed for cough., Disp: , Rfl:  .  bismuth subsalicylate (PEPTO BISMOL) 262 MG/15ML suspension, Take 30 mLs by mouth every 6 (six) hours as needed for indigestion or diarrhea or loose stools., Disp: , Rfl:  .  calcium carbonate (OS-CAL) 600 MG TABS tablet, Take 600 mg by mouth daily., Disp: , Rfl:  .  cetirizine (ZYRTEC) 10 MG tablet, Take 10 mg by mouth daily., Disp: , Rfl:  .  Cholecalciferol (VITAMIN D3) 2000 UNITS TABS, Take 4,000 Units by mouth 2 (two) times daily. , Disp: , Rfl:  .  CINNAMON PO, Take 1,000 mg by mouth 2 (two) times daily., Disp: , Rfl:  .  clindamycin (CLEOCIN) 300 MG capsule, Take 2 capsules (600 mg) 1 hour prior to all dental visits., Disp: 4 capsule, Rfl: 8 .  clopidogrel (PLAVIX) 75 MG tablet, Take 1 tablet (75 mg total) by mouth daily with breakfast., Disp: 90 tablet, Rfl: 1 .  Coenzyme Q10 (COQ-10) 100 MG CAPS, Take  100 mg by mouth daily., Disp: , Rfl:  .  Cyanocobalamin (B-12) 2500 MCG TABS, Take 2,500 mcg by mouth daily., Disp: , Rfl:  .  donepezil (ARICEPT) 10 MG tablet, Take 10 mg by mouth daily., Disp: , Rfl:  .  EPIPEN 2-PAK 0.3 MG/0.3ML SOAJ injection, Inject 0.3 mLs as directed as needed for anaphylaxis. , Disp: , Rfl:  .  ezetimibe (ZETIA) 10 MG tablet, Take 10 mg by mouth daily., Disp: , Rfl:  .  fluticasone (FLONASE) 50 MCG/ACT nasal spray, Place 2 sprays into both nostrils 2 (two) times daily as needed for allergies. , Disp: , Rfl:  .  gabapentin (NEURONTIN) 100 MG capsule, Take 300 mg by mouth at bedtime. , Disp: , Rfl:  .  glipiZIDE (GLUCOTROL XL) 2.5 MG 24 hr tablet, Take 2.5 mg by mouth daily with breakfast., Disp: , Rfl:  .  Glucosamine HCl 1000 MG TABS, Take 1,000 mg by mouth 2 (two) times daily., Disp: , Rfl:  .  ipratropium (ATROVENT) 0.03 % nasal spray, Place 2 sprays into both nostrils 2 (two) times daily as needed for rhinitis., Disp: , Rfl:  .  loperamide (IMODIUM A-D) 2 MG tablet, Take 2 mg by mouth daily as needed for diarrhea or loose stools., Disp: , Rfl:  .  methocarbamol (ROBAXIN) 500 MG tablet, Take 500  mg by mouth at bedtime as needed for muscle spasms., Disp: , Rfl:  .  metoprolol succinate (TOPROL-XL) 25 MG 24 hr tablet, Take 1 tablet by mouth once daily, Disp: 90 tablet, Rfl: 3 .  Multiple Vitamin (MULITIVITAMIN WITH MINERALS) TABS, Take 1 tablet by mouth 2 (two) times daily. , Disp: , Rfl:  .  Omega 3 1200 MG CAPS, Take 2,400 mg by mouth 2 (two) times daily. , Disp: , Rfl:  .  Probiotic CAPS, Take 1 capsule by mouth daily., Disp: , Rfl:  .  pseudoephedrine (SUDAFED) 30 MG tablet, Take 60 mg by mouth every 4 (four) hours as needed for congestion., Disp: , Rfl:  .  Saw Palmetto 450 MG CAPS, Take 900 mg by mouth daily. , Disp: , Rfl:  .  simvastatin (ZOCOR) 40 MG tablet, Take 40 mg by mouth at bedtime., Disp: , Rfl:  .  triamcinolone cream (KENALOG) 0.1 %, Apply 1 application  topically 3 (three) times daily as needed for itching., Disp: , Rfl:  .  zinc gluconate 50 MG tablet, Take 50 mg by mouth daily., Disp: , Rfl:   Past Medical History: Past Medical History:  Diagnosis Date  . Angina   . Arthritis   . Bilateral carotid artery disease ([place])    CAROTID DOPPLER,05/21/2008 - Right and left ICA-0-49% diameter reduction, left CCA-0-49% diameter reductiion  . BPH (benign prostatic hypertrophy)   . CAD S/P percutaneous coronary angioplasty 07/2006   PCI to RCA - Promus DES 2.5 mm x 23 mm; 2D ECHO - EF >55%, moderate calcification of the aortic valve leaflets  . Diabetes mellitus   . Diverticulitis    s/p colectomy  . GERD (gastroesophageal reflux disease)   . High cholesterol   . History of kidney stones    per patient, "a very long time ago and it passed by itself"  . Hypertension    "from the diabetes"  . 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 Severe aortic stenosis   . Squamous cell skin cancer, nasal tip     Tobacco Use: Social History   Tobacco Use  Smoking Status Never Smoker  Smokeless Tobacco Former Systems developer  . Types: Chew    Labs: Recent Review Flowsheet Data    Labs for ITP Cardiac and Pulmonary Rehab Latest Ref Rng & Units 07/25/2019 07/29/2019 07/29/2019 07/29/2019 07/29/2019   Cholestrol 0 - 200 mg/dL - - - - -   LDLCALC 0 - 99 mg/dL - - - - -   HDL >39 mg/dL - - - - -   Trlycerides <150 mg/dL - - - - -   Hemoglobin A1c 4.8 - 5.6 % 6.4(H) - - - -   PHART 7.35 - 7.45 - 7.426 7.433 - -   PCO2ART 32 - 48 mmHg - 42.5 43.8 - -   HCO3 20.0 - 28.0 mmol/L - 27.5 29.3(H) - -   TCO2 22 - 32 mmol/L - - _0 O2SAT % - 95.3 100.0 - -       Exercise Target Goals: Exercise Program Goal: Individual exercise prescription set using results from initial 6 min walk test and THRR while considering  patient's activity barriers and safety.    Exercise Prescription Goal: Initial exercise prescription builds to 30-45 minutes a day of aerobic activity, 2-3 days per week.  Home exercise  guidelines will be given to patient during program as part of exercise prescription that the participant will acknowledge.   Education: Aerobic Exercise & Resistance Training: - Gives group verbal and written instruction on the various components of exercise. Focuses on aerobic and resistive training programs and the benefits of this training and how to safely progress through these programs..   Cardiac Rehab from 10/02/2019 in [facility] Cardiac and Pulmonary Rehab  Date 09/25/19  [resistance]  Educator [facility]  Instruction Review Code 1- Verbalizes Understanding      Education: Exercise & Equipment Safety: - Individual verbal instruction and demonstration of equipment use and safety with use of the equipment.   Cardiac Rehab from 10/02/2019 in [facility] Cardiac and Pulmonary Rehab  Date 08/12/19  Educator [facility]  Instruction Review Code 1- Verbalizes Understanding      Education: Exercise Physiology & General Exercise Guidelines: - Group verbal and written instruction with models to review the exercise physiology of the cardiovascular system and associated critical values. Provides general exercise guidelines with specific guidelines to those with heart or lung disease.    Cardiac Rehab from 10/02/2019 in [facility] Cardiac and Pulmonary Rehab  Date 08/19/19  Instruction Review Code 3- Needs Reinforcement  [need identified]      Education: Flexibility, Balance, Mind/Body Relaxation: Provides group verbal/written instruction on the benefits of flexibility and balance training, including mind/body exercise modes such as yoga, pilates and tai chi.  Demonstration and skill practice provided.   Cardiac Rehab from 10/02/2019 in [facility] Cardiac and Pulmonary Rehab  Date 10/02/19  Educator [facility]  Instruction Review Code 1- Verbalizes Understanding      Activity Barriers & Risk  Stratification:  Activity Barriers & Cardiac Risk Stratification - 08/19/19 1127      Activity Barriers & Cardiac Risk Stratification   Activity Barriers Arthritis;Back Problems;Deconditioning;Muscular Weakness;Balance Concerns   occasional back pain, arth in hands   Cardiac Risk Stratification High           6 Minute Walk:  6 Minute Walk    Row Name 08/19/19 1127         6 Minute Walk   Phase Initial     Distance 935 feet     Walk Time 6 minutes     # of Rest Breaks 0     MPH 1.77     METS 1.78     RPE 9     VO2 Peak 6.23     Symptoms No     Resting HR 74 bpm     Resting BP 130/64     Resting Oxygen Saturation  97 %     Exercise Oxygen Saturation  during 6 min walk 95 %     Max Ex. HR 97 bpm     Max Ex. BP 146/74     2 Minute Post BP 132/70            Oxygen Initial Assessment:   Oxygen Re-Evaluation:   Oxygen Discharge (Final Oxygen Re-Evaluation):   Initial Exercise Prescription:  Initial Exercise Prescription - 08/19/19 1100      Date of Initial Exercise RX and Referring Provider   Date 08/19/19    Referring Provider [name] MD      Treadmill   MPH 1.7    Grade 0    Minutes 15    METs 2.3      Recumbant Bike   Level 1    RPM 50    Watts 5    Minutes  15    METs 1.7      NuStep   Level 1    SPM 80    Minutes 15    METs 1.7      Prescription Details   Frequency (times per week) 2    Duration Progress to 30 minutes of continuous aerobic without signs/symptoms of physical distress      Intensity   THRR 40-80% of Max Heartrate 112-127    Ratings of Perceived Exertion 11-13    Perceived Dyspnea 0-4      Progression   Progression Continue to progress workloads to maintain intensity without signs/symptoms of physical distress.      Resistance Training   Training Prescription Yes    Weight 3 lb    Reps 10-15           Perform Capillary Blood Glucose checks as needed.  Exercise Prescription Changes:  Exercise  Prescription Changes    Row Name 08/19/19 1100 09/03/19 1700 09/17/19 1300 09/30/19 1200       Response to Exercise   Blood Pressure (Admit) 130/64 112/62 120/60 142/70    Blood Pressure (Exercise) 146/74 120/60 128/58 110/60    Blood Pressure (Exit) 132/70 118/68 106/62 104/50    Heart Rate (Admit) 74 bpm 87 bpm 93 bpm 91 bpm    Heart Rate (Exercise) 97 bpm 106 bpm 104 bpm 99 bpm    Heart Rate (Exit) 69 bpm 82 bpm 75 bpm 83 bpm    Oxygen Saturation (Admit) 97 % -- -- --    Oxygen Saturation (Exercise) 95 % -- -- --    Rating of Perceived Exertion (Exercise) _0 Symptoms none none none none    Comments walk test results -- -- --    Duration -- Progress to 30 minutes of  aerobic without signs/symptoms of physical distress Progress to 30 minutes of  aerobic without signs/symptoms of physical distress Continue with 30 min of aerobic exercise without signs/symptoms of physical distress.    Intensity -- THRR unchanged THRR unchanged THRR unchanged      Progression   Progression -- Continue to progress workloads to maintain intensity without signs/symptoms of physical distress. Continue to progress workloads to maintain intensity without signs/symptoms of physical distress. Continue to progress workloads to maintain intensity without signs/symptoms of physical distress.    Average METs -- 2.36 2.55 2.56      Resistance Training   Training Prescription -- Yes Yes Yes    Weight -- 3 lb 3 lb 3 lb    Reps -- 10-15 10-15 10-15      Interval Training   Interval Training -- No No No      Treadmill   MPH -- -- 1.7 2    Grade -- -- 0 0    Minutes -- -- 15 15    METs -- -- 2.3 2.53      Recumbant Bike   Level -- 1 -- --    Watts -- 5 -- --    Minutes -- 15 -- --    METs -- 2.22 -- --      NuStep   Level -- _1 SPM -- -- 80 --    Minutes -- _2 METs -- 2.5 -- 2.6           Exercise Comments:  Exercise Comments    Row Name 08/26/19 1140  Exercise Comments First full day of exercise!  Patient was oriented to gym and equipment including functions, settings, policies, and procedures.  Patient's individual exercise prescription and treatment plan were reviewed.  All starting workloads were established based on the results of the 6 minute walk test done at initial orientation visit.  The plan for exercise progression was also introduced and progression will be customized based on patient's performance and goals.              Exercise Goals and Review:  Exercise Goals    Row Name 08/19/19 1129             Exercise Goals   Increase Physical Activity Yes       Intervention Provide advice, education, support and counseling about physical activity/exercise needs.;Develop an individualized exercise prescription for aerobic and resistive training based on initial evaluation findings, risk stratification, comorbidities and participant's personal goals.       Expected Outcomes Short Term: Attend rehab on a regular basis to increase amount of physical activity.;Long Term: Add in home exercise to make exercise part of routine and to increase amount of physical activity.;Long Term: Exercising regularly at least 3-5 days a week.       Increase Strength and Stamina Yes       Intervention Provide advice, education, support and counseling about physical activity/exercise needs.;Develop an individualized exercise prescription for aerobic and resistive training based on initial evaluation findings, risk stratification, comorbidities and participant's personal goals.       Expected Outcomes Short Term: Increase workloads from initial exercise prescription for resistance, speed, and METs.;Short Term: Perform resistance training exercises routinely during rehab and add in resistance training at home;Long Term: Improve cardiorespiratory fitness, muscular endurance and strength as measured by increased METs and functional capacity (6MWT)       Able to  understand and use rate of perceived exertion (RPE) scale Yes       Intervention Provide education and explanation on how to use RPE scale       Expected Outcomes Short Term: Able to use RPE daily in rehab to express subjective intensity level;Long Term:  Able to use RPE to guide intensity level when exercising independently       Able to understand and use Dyspnea scale Yes       Intervention Provide education and explanation on how to use Dyspnea scale       Expected Outcomes Short Term: Able to use Dyspnea scale daily in rehab to express subjective sense of shortness of breath during exertion;Long Term: Able to use Dyspnea scale to guide intensity level when exercising independently       Knowledge and understanding of Target Heart Rate Range (THRR) Yes       Intervention Provide education and explanation of THRR including how the numbers were predicted and where they are located for reference       Expected Outcomes Short Term: Able to state/look up THRR;Short Term: Able to use daily as guideline for intensity in rehab;Long Term: Able to use THRR to govern intensity when exercising independently       Able to check pulse independently Yes       Intervention Provide education and demonstration on how to check pulse in carotid and radial arteries.;Review the importance of being able to check your own pulse for safety during independent exercise       Expected Outcomes Short Term: Able to explain why pulse checking is important during independent exercise;Long Term:  Able to check pulse independently and accurately       Understanding of Exercise Prescription Yes       Intervention Provide education, explanation, and written materials on patient's individual exercise prescription       Expected Outcomes Long Term: Able to explain home exercise prescription to exercise independently;Short Term: Able to explain program exercise prescription              Exercise Goals Re-Evaluation :  Exercise  Goals Re-Evaluation    [place] Name 08/26/19 1141 09/03/19 1751 09/17/19 1318 09/30/19 1128       Exercise Goal Re-Evaluation   Exercise Goals Review Able to understand and use rate of perceived exertion (RPE) scale;Knowledge and understanding of Target Heart Rate Range (THRR);Understanding of Exercise Prescription Able to understand and use rate of perceived exertion (RPE) scale;Knowledge and understanding of Target Heart Rate Range (THRR);Understanding of Exercise Prescription Increase Physical Activity;Increase Strength and Stamina;Able to understand and use rate of perceived exertion (RPE) scale Increase Physical Activity;Increase Strength and Stamina;Understanding of Exercise Prescription    Comments Reviewed RPE and dyspnea scales, THR and program prescription with pt today.  Pt voiced understanding and was given a copy of goals to take home. Roston started his first week here at rehab and has tolerated exercise well. RPE has been within appropriate ranges. Staff will continue to monitor. Pt has tolerated exercise well.  Staff will encourage him to progress workloads on seated machines and TM. Cotton will try level 4 on NS today.  He has progressed to 4 lb weights for strength work.  Staff will monitor progress.    Expected Outcomes Short: Use RPE daily to regulate intensity. Long: Follow program prescription in THR. Short: Continue attending rehab regularly Long: Increase strength/ stamina Short: increase level on T4 Long: improve overall MET level Short: continue to attend consistently Long: improve stamina overall           Discharge Exercise Prescription (Final Exercise Prescription Changes):  Exercise Prescription Changes - 09/30/19 1200      Response to Exercise   Blood Pressure (Admit) 142/70    Blood Pressure (Exercise) 110/60    Blood Pressure (Exit) 104/50    Heart Rate (Admit) 91 bpm    Heart Rate (Exercise) 99 bpm    Heart Rate (Exit) 83 bpm    Rating of Perceived Exertion  (Exercise) 12    Symptoms none    Duration Continue with 30 min of aerobic exercise without signs/symptoms of physical distress.    Intensity THRR unchanged      Progression   Progression Continue to progress workloads to maintain intensity without signs/symptoms of physical distress.    Average METs 2.56      Resistance Training   Training Prescription Yes    Weight 3 lb    Reps 10-15      Interval Training   Interval Training No      Treadmill   MPH 2    Grade 0    Minutes 15    METs 2.53      NuStep   Level 3    Minutes 15    METs 2.6           Nutrition:  Target Goals: Understanding of nutrition guidelines, daily intake of sodium <1520m, cholesterol <206m calories 30% from fat and 7% or less from saturated fats, daily to have 5 or more servings of fruits and vegetables.  Education: Controlling Sodium/Reading Food Labels -Group verbal and written material  supporting the discussion of sodium use in heart healthy nutrition. Review and explanation with models, verbal and written materials for utilization of the food label.   Education: General Nutrition Guidelines/Fats and Fiber: -Group instruction provided by verbal, written material, models and posters to present the general guidelines for heart healthy nutrition. Gives an explanation and review of dietary fats and fiber.   Cardiac Rehab from 10/02/2019 in [facility] Cardiac and Pulmonary Rehab  Date 08/19/19  Instruction Review Code 3- Needs Reinforcement  [need identified]      Biometrics:  Pre Biometrics - 08/19/19 1132      Pre Biometrics   Height 5' 8.7" (1.745 m)    Weight 159 lb 4.8 oz (72.3 kg)    BMI (Calculated) 23.73    Single Leg Stand 2.69 seconds            Nutrition Therapy Plan and Nutrition Goals:  Nutrition Therapy & Goals - 09/02/19 1134      Nutrition Therapy   Diet Heart healthy, low Na, T2DM    Protein (specify units) 55-60g    Fiber 30 grams    Whole Grain Foods 3 servings     Saturated Fats 12 max. grams    Fruits and Vegetables 5 servings/day    Sodium 1.5 grams      Personal Nutrition Goals   Nutrition Goal ST: have a sandwich at home instead of going out to eat? add vegetables to lunch and breakfast, eat snack closer to rehab time or have larger snack to maintain BG during exercise  LT: eat in a way that helps his heart and BG    Comments Last A1C was around 6.6. BG ~120-135. No insulin.Drinks water and sometimes diet coke. B: Ham and egg, grits and dry wheat toast out, when home have a sweet toast and peanut butter and an apple or banana. Weekend - egg white omelette with ham on it and hashbrowns and dry wheat L: Eats lunch out at [employer], [employer] - chicken sandwich, subway wheat bread with [place] and lots of spinach S: boost. D: wife cooks chicken, green beans, potatoes, salads. Uses salt at the table (light salt), wife does not cook with it. Eats red meat 1x/week. Uses canola oil. Discussed heart healthy and T2DM eating.      Intervention Plan   Intervention Prescribe, educate and counsel regarding individualized specific dietary modifications aiming towards targeted core components such as weight, hypertension, lipid management, diabetes, heart failure and other comorbidities.;Nutrition handout(s) given to patient.    Expected Outcomes Short Term Goal: A plan has been developed with personal nutrition goals set during dietitian appointment.;Short Term Goal: Understand basic principles of dietary content, such as calories, fat, sodium, cholesterol and nutrients.;Long Term Goal: Adherence to prescribed nutrition plan.           Nutrition Assessments:  Nutrition Assessments - 08/20/19 0709      MEDFICTS Scores   Pre Score 56           MEDIFICTS Score Key:          ?70 Need to make dietary changes          40-70 Heart Healthy Diet         ? 40 Therapeutic Level Cholesterol Diet  Nutrition Goals Re-Evaluation:  Nutrition Goals  Re-Evaluation    [place] Name 09/30/19 1112             Goals   Nutrition Goal ST: have a sandwich at home  instead of going out to eat? add vegetables to lunch and breakfast, eat snack closer to rehab time or have larger snack to maintain BG during exercise  LT: eat in a way that helps his heart and BG       Comment Pt reports doing ok, but reports not making any changes aside from eating more at home and adding in more vegetables.       Expected Outcome ST: have a sandwich at home instead of going out to eat? add vegetables to lunch and breakfast, eat snack closer to rehab time or have larger snack to maintain BG during exercise  LT: eat in a way that helps his heart and BG              Nutrition Goals Discharge (Final Nutrition Goals Re-Evaluation):  Nutrition Goals Re-Evaluation - 09/30/19 1112      Goals   Nutrition Goal ST: have a sandwich at home instead of going out to eat? add vegetables to lunch and breakfast, eat snack closer to rehab time or have larger snack to maintain BG during exercise  LT: eat in a way that helps his heart and BG    Comment Pt reports doing ok, but reports not making any changes aside from eating more at home and adding in more vegetables.    Expected Outcome ST: have a sandwich at home instead of going out to eat? add vegetables to lunch and breakfast, eat snack closer to rehab time or have larger snack to maintain BG during exercise  LT: eat in a way that helps his heart and BG           Psychosocial: Target Goals: Acknowledge presence or absence of significant depression and/or stress, maximize coping skills, provide positive support system. Participant is able to verbalize types and ability to use techniques and skills needed for reducing stress and depression.   Education: Depression - Provides group verbal and written instruction on the correlation between heart/lung disease and depressed mood, treatment options, and the stigmas associated with  seeking treatment.   Education: Sleep Hygiene -Provides group verbal and written instruction about how sleep can affect your health.  Define sleep hygiene, discuss sleep cycles and impact of sleep habits. Review good sleep hygiene tips.     Education: Stress and Anxiety: - Provides group verbal and written instruction about the health risks of elevated stress and causes of high stress.  Discuss the correlation between heart/lung disease and anxiety and treatment options. Review healthy ways to manage with stress and anxiety.    Initial Review & Psychosocial Screening:  Initial Psych Review & Screening - 08/12/19 0838      Initial Review   Current issues with None Identified      Family Dynamics   Good Support System? Yes    Comments He can look to his wife, son and daughter for support. He states he is an overall positive person.      Barriers   Psychosocial barriers to participate in program The patient should benefit from training in stress management and relaxation.      Screening Interventions   Interventions Encouraged to exercise;Provide feedback about the scores to participant;Program counselor consult;To provide support and resources with identified psychosocial needs    Expected Outcomes Short Term goal: Utilizing psychosocial counselor, staff and physician to assist with identification of specific Stressors or current issues interfering with healing process. Setting desired goal for each stressor or current issue identified.;Long Term Goal: Stressors  or current issues are controlled or eliminated.;Short Term goal: Identification and review with participant of any Quality of Life or Depression concerns found by scoring the questionnaire.;Long Term goal: The participant improves quality of Life and PHQ9 Scores as seen by post scores and/or verbalization of changes           Quality of Life Scores:   Quality of Life - 08/20/19 0710      Quality of Life   Select Quality of  Life      Quality of Life Scores   Health/Function Pre 29.46 %    Socioeconomic Pre 26.25 %    Psych/Spiritual Pre 29.14 %    Family Pre 28.8 %    GLOBAL Pre 28.69 %          Scores of 19 and below usually indicate a poorer quality of life in these areas.  A difference of  2-3 points is a clinically meaningful difference.  A difference of 2-3 points in the total score of the Quality of Life Index has been associated with significant improvement in overall quality of life, self-image, physical symptoms, and general health in studies assessing change in quality of life.  PHQ-9: Recent Review Flowsheet Data    Depression screen [facility] 2/9 08/19/2019   Decreased Interest 0   Down, Depressed, Hopeless 0   PHQ - 2 Score 0   Altered sleeping 0   Tired, decreased energy 0   Change in appetite 0   Feeling bad or failure about yourself  0   Trouble concentrating 0   Moving slowly or fidgety/restless 0   Suicidal thoughts 0   PHQ-9 Score 0   Difficult doing work/chores Not difficult at all     Interpretation of Total Score  Total Score Depression Severity:  1-4 = Minimal depression, 5-9 = Mild depression, 10-14 = Moderate depression, 15-19 = Moderately severe depression, 20-27 = Severe depression   Psychosocial Evaluation and Intervention:  Psychosocial Evaluation - 08/12/19 0839      Psychosocial Evaluation & Interventions   Interventions Encouraged to exercise with the program and follow exercise prescription    Comments He can look to his wife, son and daughter for support. He states he is an overall positive person.    Expected Outcomes Short: Exercise regularly to support mental health and notify staff of any changes. Long: maintain mental health and well being through teaching of rehab or prescribed medications independently.    Continue Psychosocial Services  Follow up required by staff           Psychosocial Re-Evaluation:  Psychosocial Re-Evaluation    [place] Name 09/30/19  1126             Psychosocial Re-Evaluation   Current issues with Current Sleep Concerns;Current Stress Concerns       Comments Pt denies any depression or anxiety symptoms.  He sleeps well and can rely on family for support       Expected Outcomes Short: continue to exercise Long:  maintain positive outlook              Psychosocial Discharge (Final Psychosocial Re-Evaluation):  Psychosocial Re-Evaluation - 09/30/19 1126      Psychosocial Re-Evaluation   Current issues with Current Sleep Concerns;Current Stress Concerns    Comments Pt denies any depression or anxiety symptoms.  He sleeps well and can rely on family for support    Expected Outcomes Short: continue to exercise Long:  maintain positive outlook  Vocational Rehabilitation: Provide vocational rehab assistance to qualifying candidates.   Vocational Rehab Evaluation & Intervention:   Education: Education Goals: Education classes will be provided on a variety of topics geared toward better understanding of heart health and risk factor modification. Participant will state understanding/return demonstration of topics presented as noted by education test scores.  Learning Barriers/Preferences:  Learning Barriers/Preferences - 08/12/19 0837      Learning Barriers/Preferences   Learning Barriers None    Learning Preferences None           General Cardiac Education Topics:  AED/CPR: - Group verbal and written instruction with the use of models to demonstrate the basic use of the AED with the basic ABC's of resuscitation.   Anatomy & Physiology of the Heart: - Group verbal and written instruction and models provide basic cardiac anatomy and physiology, with the coronary electrical and arterial systems. Review of Valvular disease and Heart Failure   Cardiac Rehab from 10/02/2019 in [facility] Cardiac and Pulmonary Rehab  Date 08/19/19  Instruction Review Code 3- Needs Reinforcement  [need identified]       Cardiac Procedures: - Group verbal and written instruction to review commonly prescribed medications for heart disease. Reviews the medication, class of the drug, and side effects. Includes the steps to properly store meds and maintain the prescription regimen. (beta blockers and nitrates)   Cardiac Rehab from 10/02/2019 in [facility] Cardiac and Pulmonary Rehab  Date 09/25/19  Educator SB  Instruction Review Code 1- Verbalizes Understanding      Cardiac Medications I: - Group verbal and written instruction to review commonly prescribed medications for heart disease. Reviews the medication, class of the drug, and side effects. Includes the steps to properly store meds and maintain the prescription regimen.   Cardiac Medications II: -Group verbal and written instruction to review commonly prescribed medications for heart disease. Reviews the medication, class of the drug, and side effects. (all other drug classes)    Go Sex-Intimacy & Heart Disease, Get SMART - Goal Setting: - Group verbal and written instruction through game format to discuss heart disease and the return to sexual intimacy. Provides group verbal and written material to discuss and apply goal setting through the application of the S.M.A.R.T. Method.   Cardiac Rehab from 10/02/2019 in [facility] Cardiac and Pulmonary Rehab  Date 09/25/19  Educator SB  Instruction Review Code 1- Verbalizes Understanding      Other Matters of the Heart: - Provides group verbal, written materials and models to describe Stable Angina and Peripheral Artery. Includes description of the disease process and treatment options available to the cardiac patient.   Infection Prevention: - Provides verbal and written material to individual with discussion of infection control including proper hand washing and proper equipment cleaning during exercise session.   Cardiac Rehab from 10/02/2019 in [facility] Cardiac and Pulmonary Rehab  Date 08/12/19  Educator [facility]   Instruction Review Code 1- Verbalizes Understanding      Falls Prevention: - Provides verbal and written material to individual with discussion of falls prevention and safety.   Cardiac Rehab from 10/02/2019 in [facility] Cardiac and Pulmonary Rehab  Date 08/12/19  Educator [facility]  Instruction Review Code 1- Verbalizes Understanding      Other: -Provides group and verbal instruction on various topics (see comments)   Knowledge Questionnaire Score:  Knowledge Questionnaire Score - 08/20/19 0710      Knowledge Questionnaire Score   Pre Score 21/26 Education Focus: Nutrition, Exercise, Angina  Core Components/Risk Factors/Patient Goals at Admission:  Personal Goals and Risk Factors at Admission - 08/12/19 0837      Core Components/Risk Factors/Patient Goals on Admission    Weight Management Yes;Weight Gain    Intervention Weight Management: Develop a combined nutrition and exercise program designed to reach desired caloric intake, while maintaining appropriate intake of nutrient and fiber, sodium and fats, and appropriate energy expenditure required for the weight goal.;Weight Management: Provide education and appropriate resources to help participant work on and attain dietary goals.;Weight Management/Obesity: Establish reasonable short term and long term weight goals.    Expected Outcomes Short Term: Continue to assess and modify interventions until short term weight is achieved;Long Term: Adherence to nutrition and physical activity/exercise program aimed toward attainment of established weight goal;Weight Maintenance: Understanding of the daily nutrition guidelines, which includes 25-35% calories from fat, 7% or less cal from saturated fats, less than 275m cholesterol, less than 1.5gm of sodium, & 5 or more servings of fruits and vegetables daily;Understanding recommendations for meals to include 15-35% energy as protein, 25-35% energy from fat, 35-60% energy from carbohydrates,  less than 2053mof dietary cholesterol, 20-35 gm of total fiber daily;Understanding of distribution of calorie intake throughout the day with the consumption of 4-5 meals/snacks;Weight Gain: Understanding of general recommendations for a high calorie, high protein meal plan that promotes weight gain by distributing calorie intake throughout the day with the consumption for 4-5 meals, snacks, and/or supplements    Diabetes Yes    Intervention Provide education about proper nutrition, including hydration, and aerobic/resistive exercise prescription along with prescribed medications to achieve blood glucose in normal ranges: Fasting glucose 65-99 mg/dL;Provide education about signs/symptoms and action to take for hypo/hyperglycemia.    Expected Outcomes Short Term: Participant verbalizes understanding of the signs/symptoms and immediate care of hyper/hypoglycemia, proper foot care and importance of medication, aerobic/resistive exercise and nutrition plan for blood glucose control.;Long Term: Attainment of HbA1C < 7%.    Hypertension Yes    Intervention Provide education on lifestyle modifcations including regular physical activity/exercise, weight management, moderate sodium restriction and increased consumption of fresh fruit, vegetables, and low fat dairy, alcohol moderation, and smoking cessation.;Monitor prescription use compliance.    Expected Outcomes Short Term: Continued assessment and intervention until BP is < 140/9015mG in hypertensive participants. < 130/107m37m in hypertensive participants with diabetes, heart failure or chronic kidney disease.;Long Term: Maintenance of blood pressure at goal levels.    Lipids Yes    Intervention Provide education and support for participant on nutrition & aerobic/resistive exercise along with prescribed medications to achieve LDL <70mg23mL >40mg.78mExpected Outcomes Short Term: Participant states understanding of desired cholesterol values and is compliant  with medications prescribed. Participant is following exercise prescription and nutrition guidelines.;Long Term: Cholesterol controlled with medications as prescribed, with individualized exercise RX and with personalized nutrition plan. Value goals: LDL < 70mg, 43m> 40 mg.           Education:Diabetes - Individual verbal and written instruction to review signs/symptoms of diabetes, desired ranges of glucose level fasting, after meals and with exercise. Acknowledge that pre and post exercise glucose checks will be done for 3 sessions at entry of program.   [facility]  Date 08/12/19  [facility]- Verbalizes Understanding      Education: Know Your Numbers and Risk Factors: -Group verbal and written instruction about important numbers in your health.  Discussion of what are risk factors and how they play a role in the disease process.  Review of Cholesterol, Blood Pressure, Diabetes, and BMI and the role they play in your overall health.   Core Components/Risk Factors/Patient Goals Review:   Goals and Risk Factor Review    Row Name 09/30/19 1124             Core Components/Risk Factors/Patient Goals Review   Personal Goals Review Weight Management/Obesity;Hypertension;Lipids;Diabetes       Review Cotton reports taking meds as directed.  He checks BG once a day in the morning and reports it has been good.  He doesnt monitor BP at home - numbers have been good at [facility].  He is feeling good with exercise.       Expected Outcomes Short: continue to check fasting BG Long : manage risk factors              Core Components/Risk Factors/Patient Goals at Discharge (Final Review):   Goals and Risk Factor Review - 09/30/19 1124      Core Components/Risk Factors/Patient Goals Review   Personal Goals Review Weight Management/Obesity;Hypertension;Lipids;Diabetes    Review Cotton reports taking meds as directed.  He  checks BG once a day in the morning and reports it has been good.  He doesnt monitor BP at home - numbers have been good at [facility].  He is feeling good with exercise.    Expected Outcomes Short: continue to check fasting BG Long : manage risk factors           ITP Comments:  ITP Comments    Row Name 08/12/19 0841 08/19/19 1127 08/26/19 1140 09/10/19 0758 10/08/19 1659   ITP Comments Virtual Visit completed. Patient informed on EP and RD appointment and 6 Minute walk test. Patient also informed of patient health questionnaires on My Chart. Patient Verbalizes understanding. Visit diagnosis can be found in [facility] 07/29/2019. Completed 6MWT and gym orientation. Initial ITP created and sent for review to Dr. Emily Filbert, Medical Director. First full day of exercise!  Patient was oriented to gym and equipment including functions, settings, policies, and procedures.  Patient's individual exercise prescription and treatment plan were reviewed.  All starting workloads were established based on the results of the 6 minute walk test done at initial orientation visit.  The plan for exercise progression was also introduced and progression will be customized based on patient's performance and goals. 30 Day review completed. Medical Director ITP review done, changes made as directed, and signed approval by Medical Director. 30 day review completed. ITP sent to Dr. Emily Filbert, Medical Director of Cardiac and Pulmonary Rehab. Continue with ITP unless changes are made by physician.          Comments: 30 day review

## 2019-10-10 ENCOUNTER — Encounter: Payer: Self-pay | Admitting: Cardiology

## 2019-10-10 NOTE — Telephone Encounter (Signed)
error 

## 2019-10-14 ENCOUNTER — Other Ambulatory Visit: Payer: Self-pay

## 2019-10-14 ENCOUNTER — Encounter: Payer: Medicare Other | Admitting: *Deleted

## 2019-10-14 DIAGNOSIS — Z952 Presence of prosthetic heart valve: Secondary | ICD-10-CM

## 2019-10-14 NOTE — Progress Notes (Signed)
Daily Session Note  Patient Details  Name: Victor [name] MRN: [id number] Date of Birth: [birth year] Referring Provider:     Cardiac Rehab from 08/19/2019 in [facility] Cardiac and Pulmonary Rehab  Referring Provider [name] MD      Encounter Date: 10/14/2019  Check In:  Session Check In - 10/14/19 1118      Check-In   Supervising physician immediately available to respond to emergencies See telemetry face sheet for immediately available ER MD    Location [facility]-Cardiac & Pulmonary Rehab    Staff Present Heath Lark, RN, BSN, CCRP;Melissa Bertha RDN, LDN;Jessica [place], MA, RCEP, CCRP, CCET;Amanda Sommer, BA, ACSM CEP, Exercise Physiologist    Virtual Visit No    Medication changes reported     No    Fall or balance concerns reported    No    Warm-up and Cool-down Performed on first and last piece of equipment    Resistance Training Performed Yes    VAD Patient? No    PAD/SET Patient? No      Pain Assessment   Currently in Pain? No/denies              Social History   Tobacco Use  Smoking Status Never Smoker  Smokeless Tobacco Former Systems developer  . Types: Chew    Goals Met:  Independence with exercise equipment Exercise tolerated well No report of cardiac concerns or symptoms  Goals Unmet:  Not Applicable  Comments: Pt able to follow exercise prescription today without complaint.  Will continue to monitor for progression.    Dr. Emily Filbert is Medical Director for [place] and [facility].

## 2019-10-16 ENCOUNTER — Encounter: Payer: Medicare Other | Admitting: *Deleted

## 2019-10-16 ENCOUNTER — Other Ambulatory Visit: Payer: Self-pay

## 2019-10-16 ENCOUNTER — Other Ambulatory Visit: Payer: Medicare Other

## 2019-10-16 DIAGNOSIS — Z952 Presence of prosthetic heart valve: Secondary | ICD-10-CM

## 2019-10-16 NOTE — Progress Notes (Signed)
Daily Session Note  Patient Details  Name: Victor [name] MRN: [id number] Date of Birth: 12/22/1931 Referring Provider:     Cardiac Rehab from 08/19/2019 in [facility] Cardiac and Pulmonary Rehab  Referring Provider [name] MD      Encounter Date: 10/16/2019  Check In:  Session Check In - 10/16/19 1033      Check-In   Supervising physician immediately available to respond to emergencies See telemetry face sheet for immediately available ER MD    Location [facility]-Cardiac & Pulmonary Rehab    Staff Present Renita Papa, RN Margurite Auerbach, MS Exercise Physiologist;Jessica Luan Pulling, MA, RCEP, CCRP, CCET;Melissa [place] RDN, Rowe Pavy, [place], ACSM CEP, Exercise Physiologist    Virtual Visit No    Medication changes reported     No    Fall or balance concerns reported    No    Warm-up and Cool-down Performed on first and last piece of equipment    Resistance Training Performed Yes    VAD Patient? No    PAD/SET Patient? No      Pain Assessment   Currently in Pain? No/denies              Social History   Tobacco Use  Smoking Status Never Smoker  Smokeless Tobacco Former Systems developer  . Types: Chew    Goals Met:  Independence with exercise equipment Exercise tolerated well No report of cardiac concerns or symptoms Strength training completed today  Goals Unmet:  Not Applicable  Comments: Pt able to follow exercise prescription today without complaint.  Will continue to monitor for progression.    Dr. Emily Filbert is Medical Director for [place] and [facility].

## 2019-10-23 ENCOUNTER — Encounter: Payer: Medicare Other | Admitting: *Deleted

## 2019-10-23 ENCOUNTER — Other Ambulatory Visit: Payer: Self-pay

## 2019-10-23 DIAGNOSIS — Z952 Presence of prosthetic heart valve: Secondary | ICD-10-CM | POA: Diagnosis not present

## 2019-10-23 NOTE — Progress Notes (Signed)
Daily Session Note  Patient Details  Name: Victor [name] MRN: [id number] Date of Birth: Jun 28, 1931 Referring Provider:     Cardiac Rehab from 08/19/2019 in [facility] Cardiac and Pulmonary Rehab  Referring Provider [name] MD      Encounter Date: 10/23/2019  Check In:  Session Check In - 10/23/19 1132      Check-In   Supervising physician immediately available to respond to emergencies See telemetry face sheet for immediately available ER MD    Location [facility]-Cardiac & Pulmonary Rehab    Staff Present Hope Budds RDN, LDN;Joseph [facility] [place], RN Margurite Auerbach, MS Exercise Physiologist;Jessica Luan Pulling, MA, RCEP, CCRP, CCET    Virtual Visit No    Medication changes reported     No    Fall or balance concerns reported    No    Warm-up and Cool-down Performed on first and last piece of equipment    Resistance Training Performed Yes    VAD Patient? No    PAD/SET Patient? No      Pain Assessment   Currently in Pain? No/denies              Social History   Tobacco Use  Smoking Status Never Smoker  Smokeless Tobacco Former Systems developer  . Types: Chew    Goals Met:  Independence with exercise equipment Exercise tolerated well No report of cardiac concerns or symptoms Strength training completed today  Goals Unmet:  Not Applicable  Comments: Pt able to follow exercise prescription today without complaint.  Will continue to monitor for progression.    Dr. Emily Filbert is Medical Director for [place] and [facility].

## 2019-10-28 ENCOUNTER — Other Ambulatory Visit: Payer: Self-pay

## 2019-10-28 ENCOUNTER — Encounter: Payer: Medicare Other | Admitting: *Deleted

## 2019-10-28 DIAGNOSIS — Z952 Presence of prosthetic heart valve: Secondary | ICD-10-CM

## 2019-10-28 NOTE — Progress Notes (Signed)
Daily Session Note  Patient Details  Name: Victor [name] MRN: [id number] Date of Birth: [birth year] Referring Provider:     Cardiac Rehab from 08/19/2019 in [facility] Cardiac and Pulmonary Rehab  Referring Provider [name] MD      Encounter Date: 10/28/2019  Check In:  Session Check In - 10/28/19 1236      Check-In   Supervising physician immediately available to respond to emergencies See telemetry face sheet for immediately available ER MD    Location [facility]-Cardiac & Pulmonary Rehab    Staff Present Darel Hong, RN BSN;Melissa Caiola RDN, Rowe Pavy, BA, ACSM CEP, Exercise Physiologist;Atarah Cadogan, RN, BSN, CCRP    Virtual Visit No    Medication changes reported     No    Fall or balance concerns reported    No    Warm-up and Cool-down Performed on first and last piece of equipment    Resistance Training Performed Yes    VAD Patient? No    PAD/SET Patient? No      Pain Assessment   Currently in Pain? No/denies              Social History   Tobacco Use  Smoking Status Never Smoker  Smokeless Tobacco Former Systems developer  . Types: Chew    Goals Met:  Independence with exercise equipment Exercise tolerated well No report of cardiac concerns or symptoms  Goals Unmet:  Not Applicable  Comments: Pt able to follow exercise prescription today without complaint.  Will continue to monitor for progression.    Dr. Emily Filbert is Medical Director for [place] and [facility].

## 2019-10-30 ENCOUNTER — Other Ambulatory Visit: Payer: Self-pay

## 2019-10-30 ENCOUNTER — Encounter: Payer: Medicare Other | Admitting: *Deleted

## 2019-10-30 DIAGNOSIS — Z952 Presence of prosthetic heart valve: Secondary | ICD-10-CM

## 2019-10-30 NOTE — Progress Notes (Signed)
Daily Session Note  Patient Details  Name: Victor [name] MRN: [id number] Date of Birth: 1931-07-26 Referring Provider:     Cardiac Rehab from 08/19/2019 in [facility] Cardiac and Pulmonary Rehab  Referring Provider Victor Hew MD      Encounter Date: 10/30/2019  Check In:  Session Check In - 10/30/19 1121      Check-In   Supervising physician immediately available to respond to emergencies See telemetry face sheet for immediately available ER MD    Location [facility]-Cardiac & Pulmonary Rehab    Staff Present Renita Papa, RN BSN;Joseph Lou Miner, [place] Exercise Physiologist;Melissa [place] RDN, Rowe Pavy, BA, ACSM CEP, Exercise Physiologist    Virtual Visit No    Medication changes reported     No    Fall or balance concerns reported    No    Warm-up and Cool-down Performed on first and last piece of equipment    Resistance Training Performed Yes    VAD Patient? No    PAD/SET Patient? No      Pain Assessment   Currently in Pain? No/denies              Social History   Tobacco Use  Smoking Status Never Smoker  Smokeless Tobacco Former Systems developer  . Types: Chew    Goals Met:  Independence with exercise equipment Exercise tolerated well No report of cardiac concerns or symptoms Strength training completed today  Goals Unmet:  Not Applicable  Comments: Pt able to follow exercise prescription today without complaint.  Will continue to monitor for progression.    Dr. Emily [name] is Medical Director for [place] and [facility].

## 2019-11-04 ENCOUNTER — Encounter: Payer: Medicare Other | Attending: Cardiology | Admitting: *Deleted

## 2019-11-04 ENCOUNTER — Other Ambulatory Visit: Payer: Self-pay

## 2019-11-04 DIAGNOSIS — Z952 Presence of prosthetic heart valve: Secondary | ICD-10-CM | POA: Diagnosis not present

## 2019-11-04 NOTE — Progress Notes (Signed)
Daily Session Note  Patient Details  Name: Victor [name] MRN: [id number] Date of Birth: [birth year] Referring Provider:     Cardiac Rehab from 08/19/2019 in [facility] Cardiac and Pulmonary Rehab  Referring Provider [name] MD      Encounter Date: 11/04/2019  Check In:  Session Check In - 11/04/19 1604      Check-In   Supervising physician immediately available to respond to emergencies See telemetry face sheet for immediately available ER MD    Location [facility]-Cardiac & Pulmonary Rehab    Staff Present Alberteen Sam, MA, RCEP, CCRP, CCET;Joseph Hood RCP,RRT,BSRT;Amanda McGregor, [place], ACSM CEP, Exercise Physiologist;Susanne Bice, RN, BSN, CCRP    Virtual Visit No    Medication changes reported     No    Fall or balance concerns reported    No    Warm-up and Cool-down Performed on first and last piece of equipment    Resistance Training Performed Yes    VAD Patient? No    PAD/SET Patient? No              Social History   Tobacco Use  Smoking Status Never Smoker  Smokeless Tobacco Former Systems developer  . Types: Chew    Goals Met:  Independence with exercise equipment Exercise tolerated well No report of cardiac concerns or symptoms Strength training completed today  Goals Unmet:  Not Applicable  Comments: Pt able to follow exercise prescription today without complaint.  Will continue to monitor for progression.    Dr. Emily Filbert is Medical Director for [place] and [facility].

## 2019-11-05 ENCOUNTER — Encounter: Payer: Self-pay | Admitting: *Deleted

## 2019-11-05 DIAGNOSIS — Z952 Presence of prosthetic heart valve: Secondary | ICD-10-CM

## 2019-11-05 NOTE — Progress Notes (Signed)
Cardiac Individual Treatment Plan  Patient Details  Name: Victor [name] MRN: [id number] Date of Birth: [birth year] Referring Provider:     Cardiac Rehab from 08/19/2019 in [facility] Cardiac and Pulmonary Rehab  Referring Provider [name] MD      Initial Encounter Date:    Cardiac Rehab from 08/19/2019 in [facility] Cardiac and Pulmonary Rehab  Date 08/19/19      Visit Diagnosis: S/P TAVR (transcatheter aortic valve replacement)  Patient's Home Medications on Admission:  Current Outpatient Medications:  .  acetaminophen (TYLENOL) 500 MG tablet, Take 500 mg by mouth every 6 (six) hours as needed for moderate pain or headache., Disp: , Rfl:  .  Artificial Tear Solution (GENTEAL TEARS OP), Place 1 drop into both eyes daily., Disp: , Rfl:  .  ascorbic acid (VITAMIN C) 500 MG tablet, Take 1,000-1,500 mg by mouth See admin instructions. Take 1000 mg in the morning and 1500 mg at night, Disp: , Rfl:  .  aspirin EC 81 MG tablet, Take 81 mg by mouth daily., Disp: , Rfl:  .  benzonatate (TESSALON) 200 MG capsule, Take 200 mg by mouth 3 (three) times daily as needed for cough., Disp: , Rfl:  .  bismuth subsalicylate (PEPTO BISMOL) 262 MG/15ML suspension, Take 30 mLs by mouth every 6 (six) hours as needed for indigestion or diarrhea or loose stools., Disp: , Rfl:  .  calcium carbonate (OS-CAL) 600 MG TABS tablet, Take 600 mg by mouth daily., Disp: , Rfl:  .  cetirizine (ZYRTEC) 10 MG tablet, Take 10 mg by mouth daily., Disp: , Rfl:  .  Cholecalciferol (VITAMIN D3) 2000 UNITS TABS, Take 4,000 Units by mouth 2 (two) times daily. , Disp: , Rfl:  .  CINNAMON PO, Take 1,000 mg by mouth 2 (two) times daily., Disp: , Rfl:  .  clindamycin (CLEOCIN) 300 MG capsule, Take 2 capsules (600 mg) 1 hour prior to all dental visits., Disp: 4 capsule, Rfl: 8 .  clopidogrel (PLAVIX) 75 MG tablet, Take 1 tablet (75 mg total) by mouth daily with breakfast., Disp: 90 tablet, Rfl: 1 .  Coenzyme Q10 (COQ-10) 100 MG CAPS, Take  100 mg by mouth daily., Disp: , Rfl:  .  Cyanocobalamin (B-12) 2500 MCG TABS, Take 2,500 mcg by mouth daily., Disp: , Rfl:  .  donepezil (ARICEPT) 10 MG tablet, Take 10 mg by mouth daily., Disp: , Rfl:  .  EPIPEN 2-PAK 0.3 MG/0.3ML SOAJ injection, Inject 0.3 mLs as directed as needed for anaphylaxis. , Disp: , Rfl:  .  ezetimibe (ZETIA) 10 MG tablet, Take 10 mg by mouth daily., Disp: , Rfl:  .  fluticasone (FLONASE) 50 MCG/ACT nasal spray, Place 2 sprays into both nostrils 2 (two) times daily as needed for allergies. , Disp: , Rfl:  .  gabapentin (NEURONTIN) 100 MG capsule, Take 300 mg by mouth at bedtime. , Disp: , Rfl:  .  glipiZIDE (GLUCOTROL XL) 2.5 MG 24 hr tablet, Take 2.5 mg by mouth daily with breakfast., Disp: , Rfl:  .  Glucosamine HCl 1000 MG TABS, Take 1,000 mg by mouth 2 (two) times daily., Disp: , Rfl:  .  ipratropium (ATROVENT) 0.03 % nasal spray, Place 2 sprays into both nostrils 2 (two) times daily as needed for rhinitis., Disp: , Rfl:  .  loperamide (IMODIUM A-D) 2 MG tablet, Take 2 mg by mouth daily as needed for diarrhea or loose stools., Disp: , Rfl:  .  methocarbamol (ROBAXIN) 500 MG tablet, Take 500  mg by mouth at bedtime as needed for muscle spasms., Disp: , Rfl:  .  metoprolol succinate (TOPROL-XL) 25 MG 24 hr tablet, Take 1 tablet by mouth once daily, Disp: 90 tablet, Rfl: 3 .  Multiple Vitamin (MULITIVITAMIN WITH MINERALS) TABS, Take 1 tablet by mouth 2 (two) times daily. , Disp: , Rfl:  .  Omega 3 1200 MG CAPS, Take 2,400 mg by mouth 2 (two) times daily. , Disp: , Rfl:  .  Probiotic CAPS, Take 1 capsule by mouth daily., Disp: , Rfl:  .  pseudoephedrine (SUDAFED) 30 MG tablet, Take 60 mg by mouth every 4 (four) hours as needed for congestion., Disp: , Rfl:  .  Saw Palmetto 450 MG CAPS, Take 900 mg by mouth daily. , Disp: , Rfl:  .  simvastatin (ZOCOR) 40 MG tablet, Take 40 mg by mouth at bedtime., Disp: , Rfl:  .  triamcinolone cream (KENALOG) 0.1 %, Apply 1 application  topically 3 (three) times daily as needed for itching., Disp: , Rfl:  .  zinc gluconate 50 MG tablet, Take 50 mg by mouth daily., Disp: , Rfl:   Past Medical History: Past Medical History:  Diagnosis Date  . Angina   . Arthritis   . Bilateral carotid artery disease ([place])    CAROTID DOPPLER,05/21/2008 - Right and left ICA-0-49% diameter reduction, left CCA-0-49% diameter reductiion  . BPH (benign prostatic hypertrophy)   . CAD S/P percutaneous coronary angioplasty 07/2006   PCI to RCA - Promus DES 2.5 mm x 23 mm; 2D ECHO - EF >55%, moderate calcification of the aortic valve leaflets  . Diabetes mellitus   . Diverticulitis    s/p colectomy  . GERD (gastroesophageal reflux disease)   . High cholesterol   . History of kidney stones    per patient, "a very long time ago and it passed by itself"  . Hypertension    "from the diabetes"  . 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 Severe aortic stenosis   . Squamous cell skin cancer, nasal tip     Tobacco Use: Social History   Tobacco Use  Smoking Status Never Smoker  Smokeless Tobacco Former Systems developer  . Types: Chew    Labs: Recent Review Flowsheet Data    Labs for ITP Cardiac and Pulmonary Rehab Latest Ref Rng & Units 07/25/2019 07/29/2019 07/29/2019 07/29/2019 07/29/2019   Cholestrol 0 - 200 mg/dL - - - - -   LDLCALC 0 - 99 mg/dL - - - - -   HDL >39 mg/dL - - - - -   Trlycerides <150 mg/dL - - - - -   Hemoglobin A1c 4.8 - 5.6 % 6.4(H) - - - -   PHART 7.35 - 7.45 - 7.426 7.433 - -   PCO2ART 32 - 48 mmHg - 42.5 43.8 - -   HCO3 20.0 - 28.0 mmol/L - 27.5 29.3(H) - -   TCO2 22 - 32 mmol/L - - _0 O2SAT % - 95.3 100.0 - -       Exercise Target Goals: Exercise Program Goal: Individual exercise prescription set using results from initial 6 min walk test and THRR while considering  patient's activity barriers and safety.    Exercise Prescription Goal: Initial exercise prescription builds to 30-45 minutes a day of aerobic activity, 2-3 days per week.  Home exercise  guidelines will be given to patient during program as part of exercise prescription that the participant will acknowledge.   Education: Aerobic Exercise & Resistance Training: - Gives group verbal and written instruction on the various components of exercise. Focuses on aerobic and resistive training programs and the benefits of this training and how to safely progress through these programs..   Cardiac Rehab from 10/30/2019 in [facility] Cardiac and Pulmonary Rehab  Date 09/25/19  [resistance]  Educator [facility]  Instruction Review Code 1- Verbalizes Understanding      Education: Exercise & Equipment Safety: - Individual verbal instruction and demonstration of equipment use and safety with use of the equipment.   Cardiac Rehab from 10/30/2019 in [facility] Cardiac and Pulmonary Rehab  Date 08/12/19  Educator [facility]  Instruction Review Code 1- Verbalizes Understanding      Education: Exercise Physiology & General Exercise Guidelines: - Group verbal and written instruction with models to review the exercise physiology of the cardiovascular system and associated critical values. Provides general exercise guidelines with specific guidelines to those with heart or lung disease.    Cardiac Rehab from 10/30/2019 in [facility] Cardiac and Pulmonary Rehab  Date 08/19/19  Instruction Review Code 3- Needs Reinforcement  [need identified]      Education: Flexibility, Balance, Mind/Body Relaxation: Provides group verbal/written instruction on the benefits of flexibility and balance training, including mind/body exercise modes such as yoga, pilates and tai chi.  Demonstration and skill practice provided.   Cardiac Rehab from 10/30/2019 in [facility] Cardiac and Pulmonary Rehab  Date 10/02/19  Educator [facility]  Instruction Review Code 1- Verbalizes Understanding      Activity Barriers &  Risk Stratification:  Activity Barriers & Cardiac Risk Stratification - 08/19/19 1127      Activity Barriers & Cardiac Risk Stratification   Activity Barriers Arthritis;Back Problems;Deconditioning;Muscular Weakness;Balance Concerns   occasional back pain, arth in hands   Cardiac Risk Stratification High           6 Minute Walk:  6 Minute Walk    Row Name 08/19/19 1127         6 Minute Walk   Phase Initial     Distance 935 feet     Walk Time 6 minutes     # of Rest Breaks 0     MPH 1.77     METS 1.78     RPE 9     VO2 Peak 6.23     Symptoms No     Resting HR 74 bpm     Resting BP 130/64     Resting Oxygen Saturation  97 %     Exercise Oxygen Saturation  during 6 min walk 95 %     Max Ex. HR 97 bpm     Max Ex. BP 146/74     2 Minute Post BP 132/70            Oxygen Initial Assessment:   Oxygen Re-Evaluation:   Oxygen Discharge (Final Oxygen Re-Evaluation):   Initial Exercise Prescription:  Initial Exercise Prescription - 08/19/19 1100      Date of Initial Exercise RX and Referring Provider   Date 08/19/19    Referring Provider [name] MD      Treadmill   MPH 1.7    Grade 0    Minutes 15    METs 2.3      Recumbant Bike   Level 1    RPM 50    Watts 5    Minutes  15    METs 1.7      NuStep   Level 1    SPM 80    Minutes 15    METs 1.7      Prescription Details   Frequency (times per week) 2    Duration Progress to 30 minutes of continuous aerobic without signs/symptoms of physical distress      Intensity   THRR 40-80% of Max Heartrate 112-127    Ratings of Perceived Exertion 11-13    Perceived Dyspnea 0-4      Progression   Progression Continue to progress workloads to maintain intensity without signs/symptoms of physical distress.      Resistance Training   Training Prescription Yes    Weight 3 lb    Reps 10-15           Perform Capillary Blood Glucose checks as needed.  Exercise Prescription Changes:  Exercise  Prescription Changes    Row Name 08/19/19 1100 09/03/19 1700 09/17/19 1300 09/30/19 1200 10/14/19 1300     Response to Exercise   Blood Pressure (Admit) 130/64 112/62 120/60 142/70 104/66   Blood Pressure (Exercise) 146/74 120/60 128/58 110/60 122/60   Blood Pressure (Exit) 132/70 118/68 106/62 104/50 122/64   Heart Rate (Admit) 74 bpm 87 bpm 93 bpm 91 bpm 89 bpm   Heart Rate (Exercise) 97 bpm 106 bpm 104 bpm 99 bpm 106 bpm   Heart Rate (Exit) 69 bpm 82 bpm 75 bpm 83 bpm 85 bpm   Oxygen Saturation (Admit) 97 % -- -- -- --   Oxygen Saturation (Exercise) 95 % -- -- -- --   Rating of Perceived Exertion (Exercise) 9 11 12 12 13    Symptoms none none none none none   Comments walk test results -- -- -- --   Duration -- Progress to 30 minutes of  aerobic without signs/symptoms of physical distress Progress to 30 minutes of  aerobic without signs/symptoms of physical distress Continue with 30 min of aerobic exercise without signs/symptoms of physical distress. Continue with 30 min of aerobic exercise without signs/symptoms of physical distress.   Intensity -- THRR unchanged THRR unchanged THRR unchanged THRR unchanged     Progression   Progression -- Continue to progress workloads to maintain intensity without signs/symptoms of physical distress. Continue to progress workloads to maintain intensity without signs/symptoms of physical distress. Continue to progress workloads to maintain intensity without signs/symptoms of physical distress. Continue to progress workloads to maintain intensity without signs/symptoms of physical distress.   Average METs -- 2.36 2.55 2.56 2.4     Resistance Training   Training Prescription -- Yes Yes Yes Yes   Weight -- 3 lb 3 lb 3 lb 3 lb   Reps -- 10-15 10-15 10-15 10-15     Interval Training   Interval Training -- No No No No     Treadmill   MPH -- -- 1.7 2 --   Grade -- -- 0 0 --   Minutes -- -- 15 15 --   METs -- -- 2.3 2.53 --     Recumbant Bike   Level  -- 1 -- -- 3   Watts -- 5 -- -- --   Minutes -- 15 -- -- 15   METs -- 2.22 -- -- 2.21     NuStep   Level -- 1 1 3 3    SPM -- -- 80 -- 80   Minutes -- 15 15 15 15    METs -- 2.5 -- 2.6  2.6   Row Name 10/29/19 1700             Response to Exercise   Blood Pressure (Admit) 100/64       Blood Pressure (Exercise) 128/64       Blood Pressure (Exit) 124/64       Heart Rate (Admit) 70 bpm       Heart Rate (Exercise) 120 bpm       Heart Rate (Exit) 94 bpm       Symptoms none       Duration Continue with 30 min of aerobic exercise without signs/symptoms of physical distress.       Intensity THRR unchanged         Progression   Progression Continue to progress workloads to maintain intensity without signs/symptoms of physical distress.       Average METs 2.5         Resistance Training   Training Prescription Yes       Weight 3 lb       Reps 10-15         Interval Training   Interval Training No         Treadmill   MPH 2       Grade 0       Minutes 15       METs 2.53         NuStep   Level 3       SPM 80       Minutes 15              Exercise Comments:  Exercise Comments    Row Name 08/26/19 1140           Exercise Comments First full day of exercise!  Patient was oriented to gym and equipment including functions, settings, policies, and procedures.  Patient's individual exercise prescription and treatment plan were reviewed.  All starting workloads were established based on the results of the 6 minute walk test done at initial orientation visit.  The plan for exercise progression was also introduced and progression will be customized based on patient's performance and goals.              Exercise Goals and Review:  Exercise Goals    Row Name 08/19/19 1129             Exercise Goals   Increase Physical Activity Yes       Intervention Provide advice, education, support and counseling about physical activity/exercise needs.;Develop an individualized  exercise prescription for aerobic and resistive training based on initial evaluation findings, risk stratification, comorbidities and participant's personal goals.       Expected Outcomes Short Term: Attend rehab on a regular basis to increase amount of physical activity.;Long Term: Add in home exercise to make exercise part of routine and to increase amount of physical activity.;Long Term: Exercising regularly at least 3-5 days a week.       Increase Strength and Stamina Yes       Intervention Provide advice, education, support and counseling about physical activity/exercise needs.;Develop an individualized exercise prescription for aerobic and resistive training based on initial evaluation findings, risk stratification, comorbidities and participant's personal goals.       Expected Outcomes Short Term: Increase workloads from initial exercise prescription for resistance, speed, and METs.;Short Term: Perform resistance training exercises routinely during rehab and add in resistance training at home;Long Term: Improve cardiorespiratory fitness, muscular endurance and strength as measured  by increased METs and functional capacity (6MWT)       Able to understand and use rate of perceived exertion (RPE) scale Yes       Intervention Provide education and explanation on how to use RPE scale       Expected Outcomes Short Term: Able to use RPE daily in rehab to express subjective intensity level;Long Term:  Able to use RPE to guide intensity level when exercising independently       Able to understand and use Dyspnea scale Yes       Intervention Provide education and explanation on how to use Dyspnea scale       Expected Outcomes Short Term: Able to use Dyspnea scale daily in rehab to express subjective sense of shortness of breath during exertion;Long Term: Able to use Dyspnea scale to guide intensity level when exercising independently       Knowledge and understanding of Target Heart Rate Range (THRR) Yes        Intervention Provide education and explanation of THRR including how the numbers were predicted and where they are located for reference       Expected Outcomes Short Term: Able to state/look up THRR;Short Term: Able to use daily as guideline for intensity in rehab;Long Term: Able to use THRR to govern intensity when exercising independently       Able to check pulse independently Yes       Intervention Provide education and demonstration on how to check pulse in carotid and radial arteries.;Review the importance of being able to check your own pulse for safety during independent exercise       Expected Outcomes Short Term: Able to explain why pulse checking is important during independent exercise;Long Term: Able to check pulse independently and accurately       Understanding of Exercise Prescription Yes       Intervention Provide education, explanation, and written materials on patient's individual exercise prescription       Expected Outcomes Long Term: Able to explain home exercise prescription to exercise independently;Short Term: Able to explain program exercise prescription              Exercise Goals Re-Evaluation :  Exercise Goals Re-Evaluation    Row Name 08/26/19 1141 09/03/19 1751 09/17/19 1318 09/30/19 1128 10/14/19 1112     Exercise Goal Re-Evaluation   Exercise Goals Review Able to understand and use rate of perceived exertion (RPE) scale;Knowledge and understanding of Target Heart Rate Range (THRR);Understanding of Exercise Prescription Able to understand and use rate of perceived exertion (RPE) scale;Knowledge and understanding of Target Heart Rate Range (THRR);Understanding of Exercise Prescription Increase Physical Activity;Increase Strength and Stamina;Able to understand and use rate of perceived exertion (RPE) scale Increase Physical Activity;Increase Strength and Stamina;Understanding of Exercise Prescription Increase Physical Activity;Increase Strength and  Stamina;Understanding of Exercise Prescription   Comments Reviewed RPE and dyspnea scales, THR and program prescription with pt today.  Pt voiced understanding and was given a copy of goals to take home. Jacion started his first week here at rehab and has tolerated exercise well. RPE has been within appropriate ranges. Staff will continue to monitor. Pt has tolerated exercise well.  Staff will encourage him to progress workloads on seated machines and TM. Cotton will try level 4 on NS today.  He has progressed to 4 lb weights for strength work.  Staff will monitor progress. Cotton is doing well in rehab.  He goes to [employer] on [address]. He feels that  his strength and stamina are recovering.  Reviewed home exercise with pt today.  Pt plans to continue exercise at [employer] for exercise.  Reviewed THR, pulse, RPE, sign and symptoms, pulse oximetery and when to call 911 or MD.  Also discussed weather considerations and indoor options.  Pt voiced understanding.   Expected Outcomes Short: Use RPE daily to regulate intensity. Long: Follow program prescription in THR. Short: Continue attending rehab regularly Long: Increase strength/ stamina Short: increase level on T4 Long: improve overall MET level Short: continue to attend consistently Long: improve stamina overall Short: Continue to go  to [employer] and focus on getting into THR  Long: COntinue to improve stamina.   [place] Name 10/16/19 1133 10/29/19 1723           Exercise Goal Re-Evaluation   Exercise Goals Review Increase Physical Activity;Increase Strength and Stamina;Understanding of Exercise Prescription Increase Physical Activity;Increase Strength and Stamina;Understanding of Exercise Prescription      Comments Filbert Schilder is doing well in rehab and exercising on his off days at [employer].  He feels better overall and improving his stamina. Cotton tolerates exercise well. Staff will recommend trying 4-5 lb weights for strength work.       Expected Outcomes Short: Continur to go to [employer] Long: Continue to improve stamina. Short: increase weight for strength Long: increase overall stamina             Discharge Exercise Prescription (Final Exercise Prescription Changes):  Exercise Prescription Changes - 10/29/19 1700      Response to Exercise   Blood Pressure (Admit) 100/64    Blood Pressure (Exercise) 128/64    Blood Pressure (Exit) 124/64    Heart Rate (Admit) 70 bpm    Heart Rate (Exercise) 120 bpm    Heart Rate (Exit) 94 bpm    Symptoms none    Duration Continue with 30 min of aerobic exercise without signs/symptoms of physical distress.    Intensity THRR unchanged      Progression   Progression Continue to progress workloads to maintain intensity without signs/symptoms of physical distress.    Average METs 2.5      Resistance Training   Training Prescription Yes    Weight 3 lb    Reps 10-15      Interval Training   Interval Training No      Treadmill   MPH 2    Grade 0    Minutes 15    METs 2.53      NuStep   Level 3    SPM 80    Minutes 15           Nutrition:  Target Goals: Understanding of nutrition guidelines, daily intake of sodium '1500mg'$ , cholesterol '200mg'$ , calories 30% from fat and 7% or less from saturated fats, daily to have 5 or more servings of fruits and vegetables.  Education: Controlling Sodium/Reading Food Labels -Group verbal and written material supporting the discussion of sodium use in heart healthy nutrition. Review and explanation with models, verbal and written materials for utilization of the food label.   Education: General Nutrition Guidelines/Fats and Fiber: -Group instruction provided by verbal, written material, models and posters to present the general guidelines for heart healthy nutrition. Gives an explanation and review of dietary fats and fiber.   Cardiac Rehab from 10/30/2019 in [facility] Cardiac and Pulmonary Rehab  Date 08/19/19  Instruction Review  Code 3- Needs Reinforcement  [need identified]      Biometrics:  Pre Biometrics - 08/19/19 1132      Pre Biometrics   Height 5' 8.7" (1.745 m)    Weight 159 lb 4.8 oz (72.3 kg)    BMI (Calculated) 23.73    Single Leg Stand 2.69 seconds            Nutrition Therapy Plan and Nutrition Goals:  Nutrition Therapy & Goals - 09/02/19 1134      Nutrition Therapy   Diet Heart healthy, low Na, T2DM    Protein (specify units) 55-60g    Fiber 30 grams    Whole Grain Foods 3 servings    Saturated Fats 12 max. grams    Fruits and Vegetables 5 servings/day    Sodium 1.5 grams      Personal Nutrition Goals   Nutrition Goal ST: have a sandwich at home instead of going out to eat? add vegetables to lunch and breakfast, eat snack closer to rehab time or have larger snack to maintain BG during exercise  LT: eat in a way that helps his heart and BG    Comments Last A1C was around 6.6. BG ~120-135. No insulin.Drinks water and sometimes diet coke. B: Ham and egg, grits and dry wheat toast out, when home have a sweet toast and peanut butter and an apple or banana. Weekend - egg white omelette with ham on it and hashbrowns and dry wheat L: Eats lunch out at [employer], [employer] - chicken sandwich, subway wheat bread with [place] and lots of spinach S: boost. D: wife cooks chicken, green beans, potatoes, salads. Uses salt at the table (light salt), wife does not cook with it. Eats red meat 1x/week. Uses canola oil. Discussed heart healthy and T2DM eating.      Intervention Plan   Intervention Prescribe, educate and counsel regarding individualized specific dietary modifications aiming towards targeted core components such as weight, hypertension, lipid management, diabetes, heart failure and other comorbidities.;Nutrition handout(s) given to patient.    Expected Outcomes Short Term Goal: A plan has been developed with personal nutrition goals set during dietitian appointment.;Short Term Goal:  Understand basic principles of dietary content, such as calories, fat, sodium, cholesterol and nutrients.;Long Term Goal: Adherence to prescribed nutrition plan.           Nutrition Assessments:  Nutrition Assessments - 08/20/19 0709      MEDFICTS Scores   Pre Score 56           MEDIFICTS Score Key:          ?70 Need to make dietary changes          40-70 Heart Healthy Diet         ? 40 Therapeutic Level Cholesterol Diet  Nutrition Goals Re-Evaluation:  Nutrition Goals Re-Evaluation    [place] Name 09/30/19 1112 10/14/19 1119 10/16/19 1134         Goals   Nutrition Goal ST: have a sandwich at home instead of going out to eat? add vegetables to lunch and breakfast, eat snack closer to rehab time or have larger snack to maintain BG during exercise  LT: eat in a way that helps his heart and BG More at home, more vegetables Balanced meal, more vegetables     Comment Pt reports doing ok, but reports not making any changes aside from eating more at home and adding in more vegetables. Cotton is doing well with his diet.  He aims for a balanced diet.  He is trying to eat more  fruits and vegetables and watch his red meat intake. He has cut back on his eating out to just 2-3x a week.  He is also trying to balance his protein and carbs.  He is also using Boost as a recovery drink. Cotton is doing well. He is eating a balanced diet. He has tried to eat more vegetables.  He is good with his fruit intake.  He tries to avoid red meat and eats mainly chicken for his protein.     Expected Outcome ST: have a sandwich at home instead of going out to eat? add vegetables to lunch and breakfast, eat snack closer to rehab time or have larger snack to maintain BG during exercise  LT: eat in a way that helps his heart and BG Short: Continue with changes Long: Continue to focus on heart healthy diet. Short: Continue to try to get more vegetables. Long: Continue to eat heart healthy.            Nutrition Goals  Discharge (Final Nutrition Goals Re-Evaluation):  Nutrition Goals Re-Evaluation - 10/16/19 1134      Goals   Nutrition Goal Balanced meal, more vegetables    Comment Cotton is doing well. He is eating a balanced diet. He has tried to eat more vegetables.  He is good with his fruit intake.  He tries to avoid red meat and eats mainly chicken for his protein.    Expected Outcome Short: Continue to try to get more vegetables. Long: Continue to eat heart healthy.           Psychosocial: Target Goals: Acknowledge presence or absence of significant depression and/or stress, maximize coping skills, provide positive support system. Participant is able to verbalize types and ability to use techniques and skills needed for reducing stress and depression.   Education: Depression - Provides group verbal and written instruction on the correlation between heart/lung disease and depressed mood, treatment options, and the stigmas associated with seeking treatment.   Education: Sleep Hygiene -Provides group verbal and written instruction about how sleep can affect your health.  Define sleep hygiene, discuss sleep cycles and impact of sleep habits. Review good sleep hygiene tips.     Education: Stress and Anxiety: - Provides group verbal and written instruction about the health risks of elevated stress and causes of high stress.  Discuss the correlation between heart/lung disease and anxiety and treatment options. Review healthy ways to manage with stress and anxiety.    Initial Review & Psychosocial Screening:  Initial Psych Review & Screening - 08/12/19 0838      Initial Review   Current issues with None Identified      Family Dynamics   Good Support System? Yes    Comments He can look to his wife, son and daughter for support. He states he is an overall positive person.      Barriers   Psychosocial barriers to participate in program The patient should benefit from training in stress  management and relaxation.      Screening Interventions   Interventions Encouraged to exercise;Provide feedback about the scores to participant;Program counselor consult;To provide support and resources with identified psychosocial needs    Expected Outcomes Short Term goal: Utilizing psychosocial counselor, staff and physician to assist with identification of specific Stressors or current issues interfering with healing process. Setting desired goal for each stressor or current issue identified.;Long Term Goal: Stressors or current issues are controlled or eliminated.;Short Term goal: Identification and review with participant of any  Quality of Life or Depression concerns found by scoring the questionnaire.;Long Term goal: The participant improves quality of Life and PHQ9 Scores as seen by post scores and/or verbalization of changes           Quality of Life Scores:   Quality of Life - 08/20/19 0710      Quality of Life   Select Quality of Life      Quality of Life Scores   Health/Function Pre 29.46 %    Socioeconomic Pre 26.25 %    Psych/Spiritual Pre 29.14 %    Family Pre 28.8 %    GLOBAL Pre 28.69 %          Scores of 19 and below usually indicate a poorer quality of life in these areas.  A difference of  2-3 points is a clinically meaningful difference.  A difference of 2-3 points in the total score of the Quality of Life Index has been associated with significant improvement in overall quality of life, self-image, physical symptoms, and general health in studies assessing change in quality of life.  PHQ-9: Recent Review Flowsheet Data    Depression screen [facility] 2/9 08/19/2019   Decreased Interest 0   Down, Depressed, Hopeless 0   PHQ - 2 Score 0   Altered sleeping 0   Tired, decreased energy 0   Change in appetite 0   Feeling bad or failure about yourself  0   Trouble concentrating 0   Moving slowly or fidgety/restless 0   Suicidal thoughts 0   PHQ-9 Score 0   Difficult  doing work/chores Not difficult at all     Interpretation of Total Score  Total Score Depression Severity:  1-4 = Minimal depression, 5-9 = Mild depression, 10-14 = Moderate depression, 15-19 = Moderately severe depression, 20-27 = Severe depression   Psychosocial Evaluation and Intervention:  Psychosocial Evaluation - 08/12/19 0839      Psychosocial Evaluation & Interventions   Interventions Encouraged to exercise with the program and follow exercise prescription    Comments He can look to his wife, son and daughter for support. He states he is an overall positive person.    Expected Outcomes Short: Exercise regularly to support mental health and notify staff of any changes. Long: maintain mental health and well being through teaching of rehab or prescribed medications independently.    Continue Psychosocial Services  Follow up required by staff           Psychosocial Re-Evaluation:  Psychosocial Re-Evaluation    Row Name 09/30/19 1126 10/14/19 1116           Psychosocial Re-Evaluation   Current issues with Current Sleep Concerns;Current Stress Concerns None Identified      Comments Pt denies any depression or anxiety symptoms.  He sleeps well and can rely on family for support Cotton is doing well mentally.  He denies any major stressors.  He sleeps well at night and exercises routinely.      Expected Outcomes Short: continue to exercise Long:  maintain positive outlook Short: Continue to exercise  Long: Continue to stay positive.      Interventions -- Encouraged to attend Cardiac Rehabilitation for the exercise      Continue Psychosocial Services  -- Follow up required by staff             Psychosocial Discharge (Final Psychosocial Re-Evaluation):  Psychosocial Re-Evaluation - 10/14/19 1116      Psychosocial Re-Evaluation   Current issues with None Identified  Comments Cotton is doing well mentally.  He denies any major stressors.  He sleeps well at night and exercises  routinely.    Expected Outcomes Short: Continue to exercise  Long: Continue to stay positive.    Interventions Encouraged to attend Cardiac Rehabilitation for the exercise    Continue Psychosocial Services  Follow up required by staff           Vocational Rehabilitation: Provide vocational rehab assistance to qualifying candidates.   Vocational Rehab Evaluation & Intervention:   Education: Education Goals: Education classes will be provided on a variety of topics geared toward better understanding of heart health and risk factor modification. Participant will state understanding/return demonstration of topics presented as noted by education test scores.  Learning Barriers/Preferences:  Learning Barriers/Preferences - 08/12/19 0837      Learning Barriers/Preferences   Learning Barriers None    Learning Preferences None           General Cardiac Education Topics:  AED/CPR: - Group verbal and written instruction with the use of models to demonstrate the basic use of the AED with the basic ABC's of resuscitation.   Anatomy & Physiology of the Heart: - Group verbal and written instruction and models provide basic cardiac anatomy and physiology, with the coronary electrical and arterial systems. Review of Valvular disease and Heart Failure   Cardiac Rehab from 10/30/2019 in [facility] Cardiac and Pulmonary Rehab  Date 08/19/19  Instruction Review Code 3- Needs Reinforcement  [need identified]      Cardiac Procedures: - Group verbal and written instruction to review commonly prescribed medications for heart disease. Reviews the medication, class of the drug, and side effects. Includes the steps to properly store meds and maintain the prescription regimen. (beta blockers and nitrates)   Cardiac Rehab from 10/30/2019 in [facility] Cardiac and Pulmonary Rehab  Date 09/25/19  Educator SB  Instruction Review Code 1- Verbalizes Understanding      Cardiac Medications I: - Group verbal and  written instruction to review commonly prescribed medications for heart disease. Reviews the medication, class of the drug, and side effects. Includes the steps to properly store meds and maintain the prescription regimen.   Cardiac Rehab from 10/30/2019 in [facility] Cardiac and Pulmonary Rehab  Date 10/16/19  Educator SB  Instruction Review Code 1- Verbalizes Understanding      Cardiac Medications II: -Group verbal and written instruction to review commonly prescribed medications for heart disease. Reviews the medication, class of the drug, and side effects. (all other drug classes)   Cardiac Rehab from 10/30/2019 in [facility] Cardiac and Pulmonary Rehab  Date 10/23/19  Educator SB  Instruction Review Code 1- Verbalizes Understanding       Go Sex-Intimacy & Heart Disease, Get SMART - Goal Setting: - Group verbal and written instruction through game format to discuss heart disease and the return to sexual intimacy. Provides group verbal and written material to discuss and apply goal setting through the application of the S.M.A.R.T. Method.   Cardiac Rehab from 10/30/2019 in [facility] Cardiac and Pulmonary Rehab  Date 09/25/19  Educator SB  Instruction Review Code 1- Verbalizes Understanding      Other Matters of the Heart: - Provides group verbal, written materials and models to describe Stable Angina and Peripheral Artery. Includes description of the disease process and treatment options available to the cardiac patient.   Infection Prevention: - Provides verbal and written material to individual with discussion of infection control including proper hand washing and proper equipment  cleaning during exercise session.   Cardiac Rehab from 10/30/2019 in [facility] Cardiac and Pulmonary Rehab  Date 08/12/19  Educator [facility]  Instruction Review Code 1- Verbalizes Understanding      Falls Prevention: - Provides verbal and written material to individual with discussion of falls prevention and safety.    Cardiac Rehab from 10/30/2019 in [facility] Cardiac and Pulmonary Rehab  Date 08/12/19  Educator [facility]  Instruction Review Code 1- Verbalizes Understanding      Other: -Provides group and verbal instruction on various topics (see comments)   Knowledge Questionnaire Score:  Knowledge Questionnaire Score - 08/20/19 0710      Knowledge Questionnaire Score   Pre Score 21/26 Education Focus: Nutrition, Exercise, Angina           Core Components/Risk Factors/Patient Goals at Admission:  Personal Goals and Risk Factors at Admission - 08/12/19 0837      Core Components/Risk Factors/Patient Goals on Admission    Weight Management Yes;Weight Gain    Intervention Weight Management: Develop a combined nutrition and exercise program designed to reach desired caloric intake, while maintaining appropriate intake of nutrient and fiber, sodium and fats, and appropriate energy expenditure required for the weight goal.;Weight Management: Provide education and appropriate resources to help participant work on and attain dietary goals.;Weight Management/Obesity: Establish reasonable short term and long term weight goals.    Expected Outcomes Short Term: Continue to assess and modify interventions until short term weight is achieved;Long Term: Adherence to nutrition and physical activity/exercise program aimed toward attainment of established weight goal;Weight Maintenance: Understanding of the daily nutrition guidelines, which includes 25-35% calories from fat, 7% or less cal from saturated fats, less than $RemoveB'200mg'ctsbjogE$  cholesterol, less than 1.5gm of sodium, & 5 or more servings of fruits and vegetables daily;Understanding recommendations for meals to include 15-35% energy as protein, 25-35% energy from fat, 35-60% energy from carbohydrates, less than $RemoveB'200mg'zJqWAlyr$  of dietary cholesterol, 20-35 gm of total fiber daily;Understanding of distribution of calorie intake throughout the day with the consumption of 4-5 meals/snacks;Weight  Gain: Understanding of general recommendations for a high calorie, high protein meal plan that promotes weight gain by distributing calorie intake throughout the day with the consumption for 4-5 meals, snacks, and/or supplements    Diabetes Yes    Intervention Provide education about proper nutrition, including hydration, and aerobic/resistive exercise prescription along with prescribed medications to achieve blood glucose in normal ranges: Fasting glucose 65-99 mg/dL;Provide education about signs/symptoms and action to take for hypo/hyperglycemia.    Expected Outcomes Short Term: Participant verbalizes understanding of the signs/symptoms and immediate care of hyper/hypoglycemia, proper foot care and importance of medication, aerobic/resistive exercise and nutrition plan for blood glucose control.;Long Term: Attainment of HbA1C < 7%.    Hypertension Yes    Intervention Provide education on lifestyle modifcations including regular physical activity/exercise, weight management, moderate sodium restriction and increased consumption of fresh fruit, vegetables, and low fat dairy, alcohol moderation, and smoking cessation.;Monitor prescription use compliance.    Expected Outcomes Short Term: Continued assessment and intervention until BP is < 140/69mm HG in hypertensive participants. < 130/68mm HG in hypertensive participants with diabetes, heart failure or chronic kidney disease.;Long Term: Maintenance of blood pressure at goal levels.    Lipids Yes    Intervention Provide education and support for participant on nutrition & aerobic/resistive exercise along with prescribed medications to achieve LDL '70mg'$ , HDL >$Remo'40mg'dANyg$ .    Expected Outcomes Short Term: Participant states understanding of desired cholesterol values and is compliant with medications prescribed.  Participant is following exercise prescription and nutrition guidelines.;Long Term: Cholesterol controlled with medications as prescribed, with individualized  exercise RX and with personalized nutrition plan. Value goals: LDL < $Rem'70mg'IVeE$ , HDL > 40 mg.           Education:Diabetes - Individual verbal and written instruction to review signs/symptoms of diabetes, desired ranges of glucose level fasting, after meals and with exercise. Acknowledge that pre and post exercise glucose checks will be done for 3 sessions at entry of program.   Cardiac Rehab from 10/30/2019 in [facility] Cardiac and Pulmonary Rehab  Date 08/12/19  Educator [facility]  Instruction Review Code 1- Verbalizes Understanding      Education: Know Your Numbers and Risk Factors: -Group verbal and written instruction about important numbers in your health.  Discussion of what are risk factors and how they play a role in the disease process.  Review of Cholesterol, Blood Pressure, Diabetes, and BMI and the role they play in your overall health.   Cardiac Rehab from 10/30/2019 in [facility] Cardiac and Pulmonary Rehab  Date 10/23/19  Educator SB  Instruction Review Code 1- Verbalizes Understanding      Core Components/Risk Factors/Patient Goals Review:   Goals and Risk Factor Review    Row Name 09/30/19 1124 10/14/19 1117           Core Components/Risk Factors/Patient Goals Review   Personal Goals Review Weight Management/Obesity;Hypertension;Lipids;Diabetes Weight Management/Obesity;Hypertension;Lipids;Diabetes      Review Cotton reports taking meds as directed.  He checks BG once a day in the morning and reports it has been good.  He doesnt monitor BP at home - numbers have been good at [facility].  He is feeling good with exercise. Cotton is doing well in rehab. His weight is mostly steady. It is a little lower than when he started.  His blood pressures have been good in class and he does not check it at home as he has not noticed any bad one.  We talked about why recording it and tracking it at home can be helpful.  He is doing well with his blood sugars and he continues to check those at home.      Expected  Outcomes Short: continue to check fasting BG Long : manage risk factors Short: Start tracking BP at home Long; Conitnue to manage risk factors.             Core Components/Risk Factors/Patient Goals at Discharge (Final Review):   Goals and Risk Factor Review - 10/14/19 1117      Core Components/Risk Factors/Patient Goals Review   Personal Goals Review Weight Management/Obesity;Hypertension;Lipids;Diabetes    Review Cotton is doing well in rehab. His weight is mostly steady. It is a little lower than when he started.  His blood pressures have been good in class and he does not check it at home as he has not noticed any bad one.  We talked about why recording it and tracking it at home can be helpful.  He is doing well with his blood sugars and he continues to check those at home.    Expected Outcomes Short: Start tracking BP at home Long; Conitnue to manage risk factors.           ITP Comments:  ITP Comments    Row Name 08/12/19 0841 08/19/19 1127 08/26/19 1140 09/10/19 0758 10/08/19 1659   ITP Comments Virtual Visit completed. Patient informed on EP and RD appointment and 6 Minute walk test. Patient also informed of patient health  questionnaires on My Chart. Patient Verbalizes understanding. Visit diagnosis can be found in [facility] 07/29/2019. Completed 6MWT and gym orientation. Initial ITP created and sent for review to Dr. Emily Filbert, Medical Director. First full day of exercise!  Patient was oriented to gym and equipment including functions, settings, policies, and procedures.  Patient's individual exercise prescription and treatment plan were reviewed.  All starting workloads were established based on the results of the 6 minute walk test done at initial orientation visit.  The plan for exercise progression was also introduced and progression will be customized based on patient's performance and goals. 30 Day review completed. Medical Director ITP review done, changes made as directed, and signed  approval by Medical Director. 30 day review completed. ITP sent to Dr. Emily Filbert, Medical Director of Cardiac and Pulmonary Rehab. Continue with ITP unless changes are made by physician.   [place] Name 11/05/19 0720           ITP Comments 30 Day review completed. Medical Director ITP review done, changes made as directed, and signed approval by Medical Director.              Comments:

## 2019-11-06 ENCOUNTER — Encounter: Payer: Medicare Other | Admitting: *Deleted

## 2019-11-06 ENCOUNTER — Other Ambulatory Visit: Payer: Self-pay

## 2019-11-06 DIAGNOSIS — Z952 Presence of prosthetic heart valve: Secondary | ICD-10-CM

## 2019-11-06 NOTE — Progress Notes (Signed)
Daily Session Note  Patient Details  Name: Victor [name] MRN: [id number] Date of Birth: November 26, 1931 Referring Provider:     Cardiac Rehab from 08/19/2019 in [facility] Cardiac and Pulmonary Rehab  Referring Provider [name] MD      Encounter Date: 11/06/2019  Check In:  Session Check In - 11/06/19 1124      Check-In   Supervising physician immediately available to respond to emergencies See telemetry face sheet for immediately available ER MD    Location [facility]-Cardiac & Pulmonary Rehab    Staff Present Renita Papa, RN BSN;Joseph Lou Miner, [place] Exercise Physiologist;Melissa [place] RDN, LDN;Jessica [place], MA, RCEP, CCRP, CCET    Virtual Visit No    Medication changes reported     No    Fall or balance concerns reported    No    Warm-up and Cool-down Performed on first and last piece of equipment    Resistance Training Performed Yes    VAD Patient? No    PAD/SET Patient? No      Pain Assessment   Currently in Pain? No/denies              Social History   Tobacco Use  Smoking Status Never Smoker  Smokeless Tobacco Former Systems developer  . Types: Chew    Goals Met:  Independence with exercise equipment Exercise tolerated well No report of cardiac concerns or symptoms Strength training completed today  Goals Unmet:  Not Applicable  Comments: Pt able to follow exercise prescription today without complaint.  Will continue to monitor for progression.    Dr. Emily Filbert is Medical Director for [place] and [facility].

## 2019-11-11 ENCOUNTER — Other Ambulatory Visit: Payer: Self-pay

## 2019-11-11 ENCOUNTER — Encounter: Payer: Medicare Other | Admitting: *Deleted

## 2019-11-11 DIAGNOSIS — Z952 Presence of prosthetic heart valve: Secondary | ICD-10-CM

## 2019-11-11 NOTE — Progress Notes (Signed)
Daily Session Note  Patient Details  Name: Victor [name] MRN: [id number] Date of Birth: 15-Dec-1931 Referring Provider:     Cardiac Rehab from 08/19/2019 in [facility] Cardiac and Pulmonary Rehab  Referring Provider [name] MD      Encounter Date: 11/11/2019  Check In:  Session Check In - 11/11/19 1238      Check-In   Supervising physician immediately available to respond to emergencies See telemetry face sheet for immediately available ER MD    Location [facility]-Cardiac & Pulmonary Rehab    Staff Present Heath Lark, RN, BSN, CCRP;Melissa [place] RDN, LDN;Joseph [employer], [place], ACSM CEP, Exercise Physiologist    Virtual Visit No    Medication changes reported     No    Fall or balance concerns reported    No    Warm-up and Cool-down Performed on first and last piece of equipment    Resistance Training Performed Yes    VAD Patient? No    PAD/SET Patient? No      Pain Assessment   Currently in Pain? No/denies              Social History   Tobacco Use  Smoking Status Never Smoker  Smokeless Tobacco Former Systems developer  . Types: Chew    Goals Met:  Independence with exercise equipment Exercise tolerated well No report of cardiac concerns or symptoms  Goals Unmet:  Not Applicable  Comments: Pt able to follow exercise prescription today without complaint.  Will continue to monitor for progression.    Dr. Emily Filbert is Medical Director for [place] and [facility].

## 2019-11-18 ENCOUNTER — Other Ambulatory Visit: Payer: Self-pay

## 2019-11-18 ENCOUNTER — Encounter: Payer: Medicare Other | Admitting: *Deleted

## 2019-11-18 DIAGNOSIS — Z952 Presence of prosthetic heart valve: Secondary | ICD-10-CM | POA: Diagnosis not present

## 2019-11-18 NOTE — Progress Notes (Signed)
Daily Session Note  Patient Details  Name: Victor [name] MRN: [id number] Date of Birth: 06-Feb-1931 Referring Provider:     Cardiac Rehab from 08/19/2019 in [facility] Cardiac and Pulmonary Rehab  Referring Provider [name] MD      Encounter Date: 11/18/2019  Check In:  Session Check In - 11/18/19 1148      Check-In   Supervising physician immediately available to respond to emergencies See telemetry face sheet for immediately available ER MD    Location [facility]-Cardiac & Pulmonary Rehab    Staff Present Heath Lark, RN, BSN, CCRP;Melissa [place] RDN, LDN;Joseph [employer], [place], ACSM CEP, Exercise Physiologist    Virtual Visit No    Medication changes reported     No    Fall or balance concerns reported    No    Warm-up and Cool-down Performed on first and last piece of equipment    Resistance Training Performed Yes    VAD Patient? No    PAD/SET Patient? No      Pain Assessment   Currently in Pain? No/denies              Social History   Tobacco Use  Smoking Status Never Smoker  Smokeless Tobacco Former Systems developer  . Types: Chew    Goals Met:  Independence with exercise equipment Exercise tolerated well No report of cardiac concerns or symptoms  Goals Unmet:  Not Applicable  Comments: Pt able to follow exercise prescription today without complaint.  Will continue to monitor for progression.    Dr. Emily Filbert is Medical Director for [place] and [facility].

## 2019-11-20 ENCOUNTER — Other Ambulatory Visit: Payer: Self-pay

## 2019-11-20 ENCOUNTER — Encounter: Payer: Medicare Other | Admitting: *Deleted

## 2019-11-20 DIAGNOSIS — Z952 Presence of prosthetic heart valve: Secondary | ICD-10-CM

## 2019-11-20 NOTE — Progress Notes (Signed)
Daily Session Note  Patient Details  Name: Victor [name] MRN: [id number] Date of Birth: 01/09/1932 Referring Provider:     Cardiac Rehab from 08/19/2019 in [facility] Cardiac [facility] Pul[facility] Re[facility]  Referring Provider [name] MD      Encounter Date: 11/20/2019  Check In:  Session Check In - 11/20/19 1128      Check-In   Supervising physician immediately available to respond to emergencies See telemetry face sheet for immediately available ER MD    Location [facility]-Cardiac & Pulmonary Rehab    Staff Present Hope Budds RDN, Luther Redo, MPA, RN;Alphonzo Devera Sherryll Burger, RN Margurite Auerbach, MS Exercise Physiologist;Joseph Hood RCP,RRT,BSRT    Virtual Visit No    Medication changes reported     No    Fall or balance concerns reported    No    Warm-up and Cool-down Performed on first and last piece of equipment    Resistance Training Performed Yes    VAD Patient? No    PAD/SET Patient? No      Pain Assessment   Currently in Pain? No/denies              Social History   Tobacco Use  Smoking Status Never Smoker  Smokeless Tobacco Former Systems developer  . Types: Chew    Goals Met:  Independence with exercise equipment Exercise tolerated well No report of cardiac concerns or symptoms Strength training completed today  Goals Unmet:  Not Applicable  Comments: Pt able to follow exercise prescription today without complaint.  Will continue to monitor for progression.    Dr. Emily Filbert is Medical Director for [place] and [facility].

## 2019-11-27 ENCOUNTER — Other Ambulatory Visit: Payer: Self-pay

## 2019-11-27 DIAGNOSIS — Z952 Presence of prosthetic heart valve: Secondary | ICD-10-CM | POA: Diagnosis not present

## 2019-11-27 NOTE — Progress Notes (Signed)
Daily Session Note  Patient Details  Name: Victor [name] MRN: [id number] Date of Birth: Nov 16, 1931 Referring Provider:     Cardiac Rehab from 08/19/2019 in [facility] Cardiac and Pulmonary Rehab  Referring Provider [name] MD      Encounter Date: 11/27/2019  Check In:  Session Check In - 11/27/19 1141      Check-In   Supervising physician immediately available to respond to emergencies See telemetry face sheet for immediately available ER MD    Location [facility]-Cardiac & Pulmonary Rehab    Staff Present Birdie Sons, MPA, RN;Melissa Caiola RDN, Rowe Pavy, BA, ACSM CEP, Exercise Physiologist;Kara Eliezer Bottom, MS Exercise Physiologist    Virtual Visit No    Medication changes reported     No    Fall or balance concerns reported    No    Warm-up and Cool-down Performed on first and last piece of equipment    Resistance Training Performed Yes    VAD Patient? No    PAD/SET Patient? No      Pain Assessment   Currently in Pain? No/denies              Social History   Tobacco Use  Smoking Status Never Smoker  Smokeless Tobacco Former Systems developer  . Types: Chew    Goals Met:  Independence with exercise equipment Exercise tolerated well Personal goals reviewed No report of cardiac concerns or symptoms Strength training completed today  Goals Unmet:  Not Applicable  Comments: Pt able to follow exercise prescription today without complaint.  Will continue to monitor for progression.    Dr. Emily Filbert is Medical Director for [place] and [facility].

## 2019-12-03 ENCOUNTER — Encounter: Payer: Self-pay | Admitting: *Deleted

## 2019-12-03 DIAGNOSIS — Z952 Presence of prosthetic heart valve: Secondary | ICD-10-CM

## 2019-12-03 NOTE — Progress Notes (Signed)
Cardiac Individual Treatment Plan  Patient Details  Name: Victor [name] MRN: [id number] Date of Birth: [birth year] Referring Provider:     Cardiac Rehab from 08/19/2019 in [facility] Cardiac and Pulmonary Rehab  Referring Provider [name] MD      Initial Encounter Date:    Cardiac Rehab from 08/19/2019 in [facility] Cardiac and Pulmonary Rehab  Date 08/19/19      Visit Diagnosis: S/P TAVR (transcatheter aortic valve replacement)  Patient's Home Medications on Admission:  Current Outpatient Medications:  .  acetaminophen (TYLENOL) 500 MG tablet, Take 500 mg by mouth every 6 (six) hours as needed for moderate pain or headache., Disp: , Rfl:  .  Artificial Tear Solution (GENTEAL TEARS OP), Place 1 drop into both eyes daily., Disp: , Rfl:  .  ascorbic acid (VITAMIN C) 500 MG tablet, Take 1,000-1,500 mg by mouth See admin instructions. Take 1000 mg in the morning and 1500 mg at night, Disp: , Rfl:  .  aspirin EC 81 MG tablet, Take 81 mg by mouth daily., Disp: , Rfl:  .  benzonatate (TESSALON) 200 MG capsule, Take 200 mg by mouth 3 (three) times daily as needed for cough., Disp: , Rfl:  .  bismuth subsalicylate (PEPTO BISMOL) 262 MG/15ML suspension, Take 30 mLs by mouth every 6 (six) hours as needed for indigestion or diarrhea or loose stools., Disp: , Rfl:  .  calcium carbonate (OS-CAL) 600 MG TABS tablet, Take 600 mg by mouth daily., Disp: , Rfl:  .  cetirizine (ZYRTEC) 10 MG tablet, Take 10 mg by mouth daily., Disp: , Rfl:  .  Cholecalciferol (VITAMIN D3) 2000 UNITS TABS, Take 4,000 Units by mouth 2 (two) times daily. , Disp: , Rfl:  .  CINNAMON PO, Take 1,000 mg by mouth 2 (two) times daily., Disp: , Rfl:  .  clindamycin (CLEOCIN) 300 MG capsule, Take 2 capsules (600 mg) 1 hour prior to all dental visits., Disp: 4 capsule, Rfl: 8 .  clopidogrel (PLAVIX) 75 MG tablet, Take 1 tablet (75 mg total) by mouth daily with breakfast., Disp: 90 tablet, Rfl: 1 .  Coenzyme Q10 (COQ-10) 100 MG CAPS, Take  100 mg by mouth daily., Disp: , Rfl:  .  Cyanocobalamin (B-12) 2500 MCG TABS, Take 2,500 mcg by mouth daily., Disp: , Rfl:  .  donepezil (ARICEPT) 10 MG tablet, Take 10 mg by mouth daily., Disp: , Rfl:  .  EPIPEN 2-PAK 0.3 MG/0.3ML SOAJ injection, Inject 0.3 mLs as directed as needed for anaphylaxis. , Disp: , Rfl:  .  ezetimibe (ZETIA) 10 MG tablet, Take 10 mg by mouth daily., Disp: , Rfl:  .  fluticasone (FLONASE) 50 MCG/ACT nasal spray, Place 2 sprays into both nostrils 2 (two) times daily as needed for allergies. , Disp: , Rfl:  .  gabapentin (NEURONTIN) 100 MG capsule, Take 300 mg by mouth at bedtime. , Disp: , Rfl:  .  glipiZIDE (GLUCOTROL XL) 2.5 MG 24 hr tablet, Take 2.5 mg by mouth daily with breakfast., Disp: , Rfl:  .  Glucosamine HCl 1000 MG TABS, Take 1,000 mg by mouth 2 (two) times daily., Disp: , Rfl:  .  ipratropium (ATROVENT) 0.03 % nasal spray, Place 2 sprays into both nostrils 2 (two) times daily as needed for rhinitis., Disp: , Rfl:  .  loperamide (IMODIUM A-D) 2 MG tablet, Take 2 mg by mouth daily as needed for diarrhea or loose stools., Disp: , Rfl:  .  methocarbamol (ROBAXIN) 500 MG tablet, Take 500  mg by mouth at bedtime as needed for muscle spasms., Disp: , Rfl:  .  metoprolol succinate (TOPROL-XL) 25 MG 24 hr tablet, Take 1 tablet by mouth once daily, Disp: 90 tablet, Rfl: 3 .  Multiple Vitamin (MULITIVITAMIN WITH MINERALS) TABS, Take 1 tablet by mouth 2 (two) times daily. , Disp: , Rfl:  .  Omega 3 1200 MG CAPS, Take 2,400 mg by mouth 2 (two) times daily. , Disp: , Rfl:  .  Probiotic CAPS, Take 1 capsule by mouth daily., Disp: , Rfl:  .  pseudoephedrine (SUDAFED) 30 MG tablet, Take 60 mg by mouth every 4 (four) hours as needed for congestion., Disp: , Rfl:  .  Saw Palmetto 450 MG CAPS, Take 900 mg by mouth daily. , Disp: , Rfl:  .  simvastatin (ZOCOR) 40 MG tablet, Take 40 mg by mouth at bedtime., Disp: , Rfl:  .  triamcinolone cream (KENALOG) 0.1 %, Apply 1 application  topically 3 (three) times daily as needed for itching., Disp: , Rfl:  .  zinc gluconate 50 MG tablet, Take 50 mg by mouth daily., Disp: , Rfl:   Past Medical History: Past Medical History:  Diagnosis Date  . Angina   . Arthritis   . Bilateral carotid artery disease ([place])    CAROTID DOPPLER,05/21/2008 - Right and left ICA-0-49% diameter reduction, left CCA-0-49% diameter reductiion  . BPH (benign prostatic hypertrophy)   . CAD S/P percutaneous coronary angioplasty 07/2006   PCI to RCA - Promus DES 2.5 mm x 23 mm; 2D ECHO - EF >55%, moderate calcification of the aortic valve leaflets  . Diabetes mellitus   . Diverticulitis    s/p colectomy  . GERD (gastroesophageal reflux disease)   . High cholesterol   . History of kidney stones    per patient, "a very long time ago and it passed by itself"  . Hypertension    "from the diabetes"  . 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 Severe aortic stenosis   . Squamous cell skin cancer, nasal tip     Tobacco Use: Social History   Tobacco Use  Smoking Status Never Smoker  Smokeless Tobacco Former Systems developer  . Types: Chew    Labs: Recent Review Flowsheet Data    Labs for ITP Cardiac and Pulmonary Rehab Latest Ref Rng & Units 07/25/2019 07/29/2019 07/29/2019 07/29/2019 07/29/2019   Cholestrol 0 - 200 mg/dL - - - - -   LDLCALC 0 - 99 mg/dL - - - - -   HDL >39 mg/dL - - - - -   Trlycerides <150 mg/dL - - - - -   Hemoglobin A1c 4.8 - 5.6 % 6.4(H) - - - -   PHART 7.35 - 7.45 - 7.426 7.433 - -   PCO2ART 32 - 48 mmHg - 42.5 43.8 - -   HCO3 20.0 - 28.0 mmol/L - 27.5 29.3(H) - -   TCO2 22 - 32 mmol/L - - _0 O2SAT % - 95.3 100.0 - -       Exercise Target Goals: Exercise Program Goal: Individual exercise prescription set using results from initial 6 min walk test and THRR while considering  patient's activity barriers and safety.    Exercise Prescription Goal: Initial exercise prescription builds to 30-45 minutes a day of aerobic activity, 2-3 days per week.  Home exercise  guidelines will be given to patient during program as part of exercise prescription that the participant will acknowledge.   Education: Aerobic Exercise & Resistance Training: - Gives group verbal and written instruction on the various components of exercise. Focuses on aerobic and resistive training programs and the benefits of this training and how to safely progress through these programs..   Cardiac Rehab from 11/27/2019 in [facility] Cardiac and Pulmonary Rehab  Date 11/27/19  Hilda Blades 09/25/19 & on 10/28,  aerobic 10/21]  Educator [facility]  Instruction Review Code 1- Verbalizes Understanding      Education: Exercise & Equipment Safety: - Individual verbal instruction and demonstration of equipment use and safety with use of the equipment.   Cardiac Rehab from 11/27/2019 in [facility] Cardiac and Pulmonary Rehab  Date 08/12/19  Educator [facility]  Instruction Review Code 1- Verbalizes Understanding      Education: Exercise Physiology & General Exercise Guidelines: - Group verbal and written instruction with models to review the exercise physiology of the cardiovascular system and associated critical values. Provides general exercise guidelines with specific guidelines to those with heart or lung disease.    Cardiac Rehab from 11/27/2019 in [facility] Cardiac and Pulmonary Rehab  Date 08/19/19  Instruction Review Code 3- Needs Reinforcement  [need identified]      Education: Flexibility, Balance, Mind/Body Relaxation: Provides group verbal/written instruction on the benefits of flexibility and balance training, including mind/body exercise modes such as yoga, pilates and tai chi.  Demonstration and skill practice provided.   Cardiac Rehab from 11/27/2019 in [facility] Cardiac and Pulmonary Rehab  Date 10/02/19  Educator [facility]  Instruction Review Code 1- Verbalizes  Understanding      Activity Barriers & Risk Stratification:  Activity Barriers & Cardiac Risk Stratification - 08/19/19 1127      Activity Barriers & Cardiac Risk Stratification   Activity Barriers Arthritis;Back Problems;Deconditioning;Muscular Weakness;Balance Concerns   occasional back pain, arth in hands   Cardiac Risk Stratification High           6 Minute Walk:  6 Minute Walk    Row Name 08/19/19 1127         6 Minute Walk   Phase Initial     Distance 935 feet     Walk Time 6 minutes     # of Rest Breaks 0     MPH 1.77     METS 1.78     RPE 9     VO2 Peak 6.23     Symptoms No     Resting HR 74 bpm     Resting BP 130/64     Resting Oxygen Saturation  97 %     Exercise Oxygen Saturation  during 6 min walk 95 %     Max Ex. HR 97 bpm     Max Ex. BP 146/74     2 Minute Post BP 132/70            Oxygen Initial Assessment:   Oxygen Re-Evaluation:   Oxygen Discharge (Final Oxygen Re-Evaluation):   Initial Exercise Prescription:  Initial Exercise Prescription - 08/19/19 1100      Date of Initial Exercise RX and Referring Provider   Date 08/19/19    Referring Provider [name] MD      Treadmill   MPH 1.7    Grade 0    Minutes 15    METs 2.3      Recumbant Bike   Level 1    RPM 50  Watts 5    Minutes 15    METs 1.7      NuStep   Level 1    SPM 80    Minutes 15    METs 1.7      Prescription Details   Frequency (times per week) 2    Duration Progress to 30 minutes of continuous aerobic without signs/symptoms of physical distress      Intensity   THRR 40-80% of Max Heartrate 112-127    Ratings of Perceived Exertion 11-13    Perceived Dyspnea 0-4      Progression   Progression Continue to progress workloads to maintain intensity without signs/symptoms of physical distress.      Resistance Training   Training Prescription Yes    Weight 3 lb    Reps 10-15           Perform Capillary Blood Glucose checks as  needed.  Exercise Prescription Changes:  Exercise Prescription Changes    Row Name 08/19/19 1100 09/03/19 1700 09/17/19 1300 09/30/19 1200 10/14/19 1300     Response to Exercise   Blood Pressure (Admit) 130/64 112/62 120/60 142/70 104/66   Blood Pressure (Exercise) 146/74 120/60 128/58 110/60 122/60   Blood Pressure (Exit) 132/70 118/68 106/62 104/50 122/64   Heart Rate (Admit) 74 bpm 87 bpm 93 bpm 91 bpm 89 bpm   Heart Rate (Exercise) 97 bpm 106 bpm 104 bpm 99 bpm 106 bpm   Heart Rate (Exit) 69 bpm 82 bpm 75 bpm 83 bpm 85 bpm   Oxygen Saturation (Admit) 97 % -- -- -- --   Oxygen Saturation (Exercise) 95 % -- -- -- --   Rating of Perceived Exertion (Exercise) _0 Symptoms _1    Comments walk test results -- -- -- --   Duration -- Progress to 30 minutes of  aerobic without signs/symptoms of physical distress Progress to 30 minutes of  aerobic without signs/symptoms of physical distress Continue with 30 min of aerobic exercise without signs/symptoms of physical distress. Continue with 30 min of aerobic exercise without signs/symptoms of physical distress.   Intensity -- THRR unchanged THRR unchanged THRR unchanged THRR unchanged     Progression   Progression -- Continue to progress workloads to maintain intensity without signs/symptoms of physical distress. Continue to progress workloads to maintain intensity without signs/symptoms of physical distress. Continue to progress workloads to maintain intensity without signs/symptoms of physical distress. Continue to progress workloads to maintain intensity without signs/symptoms of physical distress.   Average METs -- 2.36 2.55 2.56 2.4     Resistance Training   Training Prescription -- Yes Yes Yes Yes   Weight -- 3 lb 3 lb 3 lb 3 lb   Reps -- 10-15 10-15 10-15 10-15     Interval Training   Interval Training -- No No No No     Treadmill   MPH -- -- 1.7 2 --   Grade -- -- 0 0 --   Minutes -- -- 15 15 --    METs -- -- 2.3 2.53 --     Recumbant Bike   Level -- 1 -- -- 3   Watts -- 5 -- -- --   Minutes -- 15 -- -- 15   METs -- 2.22 -- -- 2.21     NuStep   Level -- _2 SPM -- -- 80 -- 80   Minutes -- _3 15  METs -- 2.5 -- 2.6 2.6   Row Name 10/29/19 1700 11/12/19 1400 11/26/19 1400         Response to Exercise   Blood Pressure (Admit) 100/64 116/60 110/60     Blood Pressure (Exercise) 128/64 134/64 122/68     Blood Pressure (Exit) 124/64 122/60 96/64     Heart Rate (Admit) 70 bpm 65 bpm 88 bpm     Heart Rate (Exercise) 120 bpm 93 bpm 100 bpm     Heart Rate (Exit) 94 bpm 66 bpm 90 bpm     Rating of Perceived Exertion (Exercise) -- -- 12     Symptoms none none none     Duration Continue with 30 min of aerobic exercise without signs/symptoms of physical distress. Continue with 30 min of aerobic exercise without signs/symptoms of physical distress. Continue with 30 min of aerobic exercise without signs/symptoms of physical distress.     Intensity THRR unchanged THRR unchanged THRR unchanged       Progression   Progression Continue to progress workloads to maintain intensity without signs/symptoms of physical distress. Continue to progress workloads to maintain intensity without signs/symptoms of physical distress. Continue to progress workloads to maintain intensity without signs/symptoms of physical distress.     Average METs 2.5 2.4 2.06       Resistance Training   Training Prescription Yes Yes Yes     Weight 3 lb 3 lb 3 lb     Reps 10-15 10-15 10-15       Interval Training   Interval Training No No No       Treadmill   MPH 2 2.1 2.1     Grade 0 0 0     Minutes _0 METs 2.53 2.61 2.61       Recumbant Bike   Level -- -- 3     Minutes -- -- 15       NuStep   Level 3 -- 3     SPM 80 -- --     Minutes 15 -- 15       T5 Nustep   Level -- 1 --     SPM -- 80 --     Minutes -- 15 --     METs -- 2.2 --       Home Exercise Plan   Plans to continue  exercise at -- -- [employer] (comment)  [employer]     Frequency -- -- Add 2 additional days to program exercise sessions.     Initial Home Exercises Provided -- -- 10/14/19            Exercise Comments:  Exercise Comments    Row Name 08/26/19 1140           Exercise Comments First full day of exercise!  Patient was oriented to gym and equipment including functions, settings, policies, and procedures.  Patient's individual exercise prescription and treatment plan were reviewed.  All starting workloads were established based on the results of the 6 minute walk test done at initial orientation visit.  The plan for exercise progression was also introduced and progression will be customized based on patient's performance and goals.              Exercise Goals and Review:  Exercise Goals    Row Name 08/19/19 1129             Exercise Goals   Increase Physical Activity Yes  Intervention Provide advice, education, support and counseling about physical activity/exercise needs.;Develop an individualized exercise prescription for aerobic and resistive training based on initial evaluation findings, risk stratification, comorbidities and participant's personal goals.       Expected Outcomes Short Term: Attend rehab on a regular basis to increase amount of physical activity.;Long Term: Add in home exercise to make exercise part of routine and to increase amount of physical activity.;Long Term: Exercising regularly at least 3-5 days a week.       Increase Strength and Stamina Yes       Intervention Provide advice, education, support and counseling about physical activity/exercise needs.;Develop an individualized exercise prescription for aerobic and resistive training based on initial evaluation findings, risk stratification, comorbidities and participant's personal goals.       Expected Outcomes Short Term: Increase workloads from initial exercise prescription for resistance,  speed, and METs.;Short Term: Perform resistance training exercises routinely during rehab and add in resistance training at home;Long Term: Improve cardiorespiratory fitness, muscular endurance and strength as measured by increased METs and functional capacity (6MWT)       Able to understand and use rate of perceived exertion (RPE) scale Yes       Intervention Provide education and explanation on how to use RPE scale       Expected Outcomes Short Term: Able to use RPE daily in rehab to express subjective intensity level;Long Term:  Able to use RPE to guide intensity level when exercising independently       Able to understand and use Dyspnea scale Yes       Intervention Provide education and explanation on how to use Dyspnea scale       Expected Outcomes Short Term: Able to use Dyspnea scale daily in rehab to express subjective sense of shortness of breath during exertion;Long Term: Able to use Dyspnea scale to guide intensity level when exercising independently       Knowledge and understanding of Target Heart Rate Range (THRR) Yes       Intervention Provide education and explanation of THRR including how the numbers were predicted and where they are located for reference       Expected Outcomes Short Term: Able to state/look up THRR;Short Term: Able to use daily as guideline for intensity in rehab;Long Term: Able to use THRR to govern intensity when exercising independently       Able to check pulse independently Yes       Intervention Provide education and demonstration on how to check pulse in carotid and radial arteries.;Review the importance of being able to check your own pulse for safety during independent exercise       Expected Outcomes Short Term: Able to explain why pulse checking is important during independent exercise;Long Term: Able to check pulse independently and accurately       Understanding of Exercise Prescription Yes       Intervention Provide education, explanation, and written  materials on patient's individual exercise prescription       Expected Outcomes Long Term: Able to explain home exercise prescription to exercise independently;Short Term: Able to explain program exercise prescription              Exercise Goals Re-Evaluation :  Exercise Goals Re-Evaluation    Row Name 08/26/19 1141 09/03/19 1751 09/17/19 1318 09/30/19 1128 10/14/19 1112     Exercise Goal Re-Evaluation   Exercise Goals Review Able to understand and use rate of perceived exertion (RPE) scale;Knowledge and understanding of  Target Heart Rate Range (THRR);Understanding of Exercise Prescription Able to understand and use rate of perceived exertion (RPE) scale;Knowledge and understanding of Target Heart Rate Range (THRR);Understanding of Exercise Prescription Increase Physical Activity;Increase Strength and Stamina;Able to understand and use rate of perceived exertion (RPE) scale Increase Physical Activity;Increase Strength and Stamina;Understanding of Exercise Prescription Increase Physical Activity;Increase Strength and Stamina;Understanding of Exercise Prescription   Comments Reviewed RPE and dyspnea scales, THR and program prescription with pt today.  Pt voiced understanding and was given a copy of goals to take home. Elex started his first week here at rehab and has tolerated exercise well. RPE has been within appropriate ranges. Staff will continue to monitor. Pt has tolerated exercise well.  Staff will encourage him to progress workloads on seated machines and TM. Victor [name] will try level 4 on NS today.  He has progressed to 4 lb weights for strength work.  Staff will monitor progress. Victor [name] is doing well in rehab.  He goes to [employer] on [address]. He feels that his strength and stamina are recovering.  Reviewed home exercise with pt today.  Pt plans to continue exercise at [employer] for exercise.  Reviewed THR, pulse, RPE, sign and symptoms, pulse oximetery and when to call 911 or MD.  Also  discussed weather considerations and indoor options.  Pt voiced understanding.   Expected Outcomes Short: Use RPE daily to regulate intensity. Long: Follow program prescription in THR. Short: Continue attending rehab regularly Long: Increase strength/ stamina Short: increase level on T4 Long: improve overall MET level Short: continue to attend consistently Long: improve stamina overall Short: Continue to go  to [employer] and focus on getting into THR  Long: COntinue to improve stamina.   [place] Name 10/16/19 1133 10/29/19 1723 11/12/19 1413 11/26/19 1455       Exercise Goal Re-Evaluation   Exercise Goals Review Increase Physical Activity;Increase Strength and Stamina;Understanding of Exercise Prescription Increase Physical Activity;Increase Strength and Stamina;Understanding of Exercise Prescription Increase Physical Activity;Increase Strength and Stamina;Understanding of Exercise Prescription Increase Physical Activity;Increase Strength and Stamina;Understanding of Exercise Prescription    Comments Filbert Schilder is doing well in rehab and exercising on his off days at [employer].  He feels better overall and improving his stamina. Victor [name] tolerates exercise well. Staff will recommend trying 4-5 lb weights for strength work. Victor [name] has increased speed on TM.  He is still using 3 lb weights. Victor [name] has been doing well in rehab. He called out today.  He is now using the XR.  We will continue to monitor his progress.    Expected Outcomes Short: Continur to go to [employer] Long: Continue to improve stamina. Short: increase weight for strength Long: increase overall stamina Short: increase to 4 lb for strength work Long: improve MET level Short: Increase to 4lb weights Long: Conitnue to improve stamina.           Discharge Exercise Prescription (Final Exercise Prescription Changes):  Exercise Prescription Changes - 11/26/19 1400      Response to Exercise   Blood Pressure (Admit) 110/60    Blood  Pressure (Exercise) 122/68    Blood Pressure (Exit) 96/64    Heart Rate (Admit) 88 bpm    Heart Rate (Exercise) 100 bpm    Heart Rate (Exit) 90 bpm    Rating of Perceived Exertion (Exercise) 12    Symptoms none    Duration Continue with 30 min of aerobic exercise without signs/symptoms of physical distress.    Intensity THRR unchanged  Progression   Progression Continue to progress workloads to maintain intensity without signs/symptoms of physical distress.    Average METs 2.06      Resistance Training   Training Prescription Yes    Weight 3 lb    Reps 10-15      Interval Training   Interval Training No      Treadmill   MPH 2.1    Grade 0    Minutes 15    METs 2.61      Recumbant Bike   Level 3    Minutes 15      NuStep   Level 3    Minutes 15      Home Exercise Plan   Plans to continue exercise at [employer] (comment)   [employer]   Frequency Add 2 additional days to program exercise sessions.    Initial Home Exercises Provided 10/14/19           Nutrition:  Target Goals: Understanding of nutrition guidelines, daily intake of sodium <1562m, cholesterol <2038m calories 30% from fat and 7% or less from saturated fats, daily to have 5 or more servings of fruits and vegetables.  Education: Controlling Sodium/Reading Food Labels -Group verbal and written material supporting the discussion of sodium use in heart healthy nutrition. Review and explanation with models, verbal and written materials for utilization of the food label.   Education: General Nutrition Guidelines/Fats and Fiber: -Group instruction provided by verbal, written material, models and posters to present the general guidelines for heart healthy nutrition. Gives an explanation and review of dietary fats and fiber.   [facility]  Date 08/19/19  Instruction Review Code 3- Needs Reinforcement  [need identified]       Biometrics:  Pre Biometrics - 08/19/19 1132      Pre Biometrics   Height 5' 8.7" (1.745 m)    Weight 159 lb 4.8 oz (72.3 kg)    BMI (Calculated) 23.73    Single Leg Stand 2.69 seconds            Nutrition Therapy Plan and Nutrition Goals:  Nutrition Therapy & Goals - 09/02/19 1134      Nutrition Therapy   Diet Heart healthy, low Na, T2DM    Protein (specify units) 55-60g    Fiber 30 grams    Whole Grain Foods 3 servings    Saturated Fats 12 max. grams    Fruits and Vegetables 5 servings/day    Sodium 1.5 grams      Personal Nutrition Goals   Nutrition Goal ST: have a sandwich at home instead of going out to eat? add vegetables to lunch and breakfast, eat snack closer to rehab time or have larger snack to maintain BG during exercise  LT: eat in a way that helps his heart and BG    Comments Last A1C was around 6.6. BG ~120-135. No insulin.Drinks water and sometimes diet coke. B: Ham and egg, grits and dry wheat toast out, when home have a sweet toast and peanut butter and an apple or banana. Weekend - egg white omelette with ham on it and hashbrowns and dry wheat L: Eats lunch out at [employer] - chicken sandwich, subway wheat bread with [place] lots of spinach S: boost. D: wife cooks chicken, green beans, potatoes, salads. Uses salt at the table (light salt), wife does not cook with it. Eats red meat 1x/week. Uses canola oil. Discussed heart  healthy and T2DM eating.      Intervention Plan   Intervention Prescribe, educate and counsel regarding individualized specific dietary modifications aiming towards targeted core components such as weight, hypertension, lipid management, diabetes, heart failure and other comorbidities.;Nutrition handout(s) given to patient.    Expected Outcomes Short Term Goal: A plan has been developed with personal nutrition goals set during dietitian appointment.;Short Term Goal: Understand basic principles of dietary content,  such as calories, fat, sodium, cholesterol and nutrients.;Long Term Goal: Adherence to prescribed nutrition plan.           Nutrition Assessments:  Nutrition Assessments - 08/20/19 0709      MEDFICTS Scores   Pre Score 56           MEDIFICTS Score Key:          ?70 Need to make dietary changes          40-70 Heart Healthy Diet         ? 40 Therapeutic Level Cholesterol Diet  Nutrition Goals Re-Evaluation:  Nutrition Goals Re-Evaluation    [place] Name 09/30/19 1112 10/14/19 1119 10/16/19 1134 11/27/19 1146       Goals   Nutrition Goal ST: have a sandwich at home instead of going out to eat? add vegetables to lunch and breakfast, eat snack closer to rehab time or have larger snack to maintain BG during exercise  LT: eat in a way that helps his heart and BG More at home, more vegetables Balanced meal, more vegetables ST: continue with increasing vegetable intake LT: at least 5 fruits and vegetables per day    Comment Pt reports doing ok, but reports not making any changes aside from eating more at home and adding in more vegetables. Victor [name] is doing well with his diet.  He aims for a balanced diet.  He is trying to eat more fruits and vegetables and watch his red meat intake. He has cut back on his eating out to just 2-3x a week.  He is also trying to balance his protein and carbs.  He is also using Boost as a recovery drink. Victor [name] is doing well. He is eating a balanced diet. He has tried to eat more vegetables.  He is good with his fruit intake.  He tries to avoid red meat and eats mainly chicken for his protein. Victor [name] reports doing well with his diet and reports no changes since last review. Consider adding vegetables to lunch and breakfast to increase intake.    Expected Outcome ST: have a sandwich at home instead of going out to eat? add vegetables to lunch and breakfast, eat snack closer to rehab time or have larger snack to maintain BG during exercise  LT: eat in a way that helps his  heart and BG Short: Continue with changes Long: Continue to focus on heart healthy diet. Short: Continue to try to get more vegetables. Long: Continue to eat heart healthy. ST: continue with increasing vegetable intake LT: at least 5 fruits and vegetables per day           Nutrition Goals Discharge (Final Nutrition Goals Re-Evaluation):  Nutrition Goals Re-Evaluation - 11/27/19 1146      Goals   Nutrition Goal ST: continue with increasing vegetable intake LT: at least 5 fruits and vegetables per day    Comment Victor [name] reports doing well with his diet and reports no changes since last review. Consider adding vegetables to lunch and breakfast to increase intake.    Expected  Outcome ST: continue with increasing vegetable intake LT: at least 5 fruits and vegetables per day           Psychosocial: Target Goals: Acknowledge presence or absence of significant depression and/or stress, maximize coping skills, provide positive support system. Participant is able to verbalize types and ability to use techniques and skills needed for reducing stress and depression.   Education: Depression - Provides group verbal and written instruction on the correlation between heart/lung disease and depressed mood, treatment options, and the stigmas associated with seeking treatment.   Cardiac Rehab from 11/27/2019 in [facility] Cardiac and Pulmonary Rehab  Date 11/06/19  Educator [facility]  Instruction Review Code 1- Verbalizes Understanding      Education: Sleep Hygiene -Provides group verbal and written instruction about how sleep can affect your health.  Define sleep hygiene, discuss sleep cycles and impact of sleep habits. Review good sleep hygiene tips.     Education: Stress and Anxiety: - Provides group verbal and written instruction about the health risks of elevated stress and causes of high stress.  Discuss the correlation between heart/lung disease and anxiety and treatment options. Review healthy ways to  manage with stress and anxiety.   Cardiac Rehab from 11/27/2019 in [facility] Cardiac and Pulmonary Rehab  Date 11/06/19  Educator [facility]  Instruction Review Code 1- Verbalizes Understanding       Initial Review & Psychosocial Screening:  Initial Psych Review & Screening - 08/12/19 0838      Initial Review   Current issues with None Identified      Family Dynamics   Good Support System? Yes    Comments He can look to his wife, son and daughter for support. He states he is an overall positive person.      Barriers   Psychosocial barriers to participate in program The patient should benefit from training in stress management and relaxation.      Screening Interventions   Interventions Encouraged to exercise;Provide feedback about the scores to participant;Program counselor consult;To provide support and resources with identified psychosocial needs    Expected Outcomes Short Term goal: Utilizing psychosocial counselor, staff and physician to assist with identification of specific Stressors or current issues interfering with healing process. Setting desired goal for each stressor or current issue identified.;Long Term Goal: Stressors or current issues are controlled or eliminated.;Short Term goal: Identification and review with participant of any Quality of Life or Depression concerns found by scoring the questionnaire.;Long Term goal: The participant improves quality of Life and PHQ9 Scores as seen by post scores and/or verbalization of changes           Quality of Life Scores:   Quality of Life - 08/20/19 0710      Quality of Life   Select Quality of Life      Quality of Life Scores   Health/Function Pre 29.46 %    Socioeconomic Pre 26.25 %    Psych/Spiritual Pre 29.14 %    Family Pre 28.8 %    GLOBAL Pre 28.69 %          Scores of 19 and below usually indicate a poorer quality of life in these areas.  A difference of  2-3 points is a clinically meaningful difference.  A difference  of 2-3 points in the total score of the Quality of Life Index has been associated with significant improvement in overall quality of life, self-image, physical symptoms, and general health in studies assessing change in quality of life.  PHQ-9: Recent Review Flowsheet Data    Depression screen [facility] 2/9 08/19/2019   Decreased Interest 0   Down, Depressed, Hopeless 0   PHQ - 2 Score 0   Altered sleeping 0   Tired, decreased energy 0   Change in appetite 0   Feeling bad or failure about yourself  0   Trouble concentrating 0   Moving slowly or fidgety/restless 0   Suicidal thoughts 0   PHQ-9 Score 0   Difficult doing work/chores Not difficult at all     Interpretation of Total Score  Total Score Depression Severity:  1-4 = Minimal depression, 5-9 = Mild depression, 10-14 = Moderate depression, 15-19 = Moderately severe depression, 20-27 = Severe depression   Psychosocial Evaluation and Intervention:  Psychosocial Evaluation - 08/12/19 0839      Psychosocial Evaluation & Interventions   Interventions Encouraged to exercise with the program and follow exercise prescription    Comments He can look to his wife, son and daughter for support. He states he is an overall positive person.    Expected Outcomes Short: Exercise regularly to support mental health and notify staff of any changes. Long: maintain mental health and well being through teaching of rehab or prescribed medications independently.    Continue Psychosocial Services  Follow up required by staff           Psychosocial Re-Evaluation:  Psychosocial Re-Evaluation    [place] Name 09/30/19 1126 10/14/19 1116 11/27/19 1124         Psychosocial Re-Evaluation   Current issues with Current Sleep Concerns;Current Stress Concerns None Identified --     Comments Pt denies any depression or anxiety symptoms.  He sleeps well and can rely on family for support Victor [name] is doing well mentally.  He denies any major stressors.  He sleeps well  at night and exercises routinely. Victor [name] reports no change in stress or sleep.     Expected Outcomes Short: continue to exercise Long:  maintain positive outlook Short: Continue to exercise  Long: Continue to stay positive. Short: continue to exercise Long: maintain positive outlook     Interventions -- Encouraged to attend Cardiac Rehabilitation for the exercise --     Continue Psychosocial Services  -- Follow up required by staff --            Psychosocial Discharge (Final Psychosocial Re-Evaluation):  Psychosocial Re-Evaluation - 11/27/19 1124      Psychosocial Re-Evaluation   Comments Victor [name] reports no change in stress or sleep.    Expected Outcomes Short: continue to exercise Long: maintain positive outlook           Vocational Rehabilitation: Provide vocational rehab assistance to qualifying candidates.   Vocational Rehab Evaluation & Intervention:   Education: Education Goals: Education classes will be provided on a variety of topics geared toward better understanding of heart health and risk factor modification. Participant will state understanding/return demonstration of topics presented as noted by education test scores.  Learning Barriers/Preferences:  Learning Barriers/Preferences - 08/12/19 0837      Learning Barriers/Preferences   Learning Barriers None    Learning Preferences None           General Cardiac Education Topics:  AED/CPR: - Group verbal and written instruction with the use of models to demonstrate the basic use of the AED with the basic ABC's of resuscitation.   Anatomy & Physiology of the Heart: - Group verbal and written instruction and models provide basic cardiac anatomy and physiology, with  the coronary electrical and arterial systems. Review of Valvular disease and Heart Failure   Cardiac Rehab from 11/27/2019 in [facility] Cardiac and Pulmonary Rehab  Date 11/27/19  Educator SB  Instruction Review Code 1- Verbalizes Understanding  [need  identified]      Cardiac Procedures: - Group verbal and written instruction to review commonly prescribed medications for heart disease. Reviews the medication, class of the drug, and side effects. Includes the steps to properly store meds and maintain the prescription regimen. (beta blockers and nitrates)   Cardiac Rehab from 11/27/2019 in [facility] Cardiac and Pulmonary Rehab  Date 11/27/19  Educator SB  Instruction Review Code 1- Verbalizes Understanding      Cardiac Medications I: - Group verbal and written instruction to review commonly prescribed medications for heart disease. Reviews the medication, class of the drug, and side effects. Includes the steps to properly store meds and maintain the prescription regimen.   Cardiac Rehab from 11/27/2019 in [facility] Cardiac and Pulmonary Rehab  Date 10/16/19  Educator SB  Instruction Review Code 1- Verbalizes Understanding      Cardiac Medications II: -Group verbal and written instruction to review commonly prescribed medications for heart disease. Reviews the medication, class of the drug, and side effects. (all other drug classes)   Cardiac Rehab from 11/27/2019 in [facility] Cardiac and Pulmonary Rehab  Date 10/23/19  Educator SB  Instruction Review Code 1- Verbalizes Understanding       Go Sex-Intimacy & Heart Disease, Get SMART - Goal Setting: - Group verbal and written instruction through game format to discuss heart disease and the return to sexual intimacy. Provides group verbal and written material to discuss and apply goal setting through the application of the S.M.A.R.T. Method.   Cardiac Rehab from 11/27/2019 in [facility] Cardiac and Pulmonary Rehab  Date 11/27/19  Educator SB  Instruction Review Code 1- Verbalizes Understanding      Other Matters of the Heart: - Provides group verbal, written materials and models to describe Stable Angina and Peripheral Artery. Includes description of the disease process and treatment options  available to the cardiac patient.   Infection Prevention: - Provides verbal and written material to individual with discussion of infection control including proper hand washing and proper equipment cleaning during exercise session.   Cardiac Rehab from 11/27/2019 in [facility] Cardiac and Pulmonary Rehab  Date 08/12/19  Educator [facility]  Instruction Review Code 1- Verbalizes Understanding      Falls Prevention: - Provides verbal and written material to individual with discussion of falls prevention and safety.   Cardiac Rehab from 11/27/2019 in [facility] Cardiac and Pulmonary Rehab  Date 08/12/19  Educator [facility]  Instruction Review Code 1- Verbalizes Understanding      Other: -Provides group and verbal instruction on various topics (see comments)   Knowledge Questionnaire Score:  Knowledge Questionnaire Score - 08/20/19 0710      Knowledge Questionnaire Score   Pre Score 21/26 Education Focus: Nutrition, Exercise, Angina           Core Components/Risk Factors/Patient Goals at Admission:  Personal Goals and Risk Factors at Admission - 08/12/19 0837      Core Components/Risk Factors/Patient Goals on Admission    Weight Management Yes;Weight Gain    Intervention Weight Management: Develop a combined nutrition and exercise program designed to reach desired caloric intake, while maintaining appropriate intake of nutrient and fiber, sodium and fats, and appropriate energy expenditure required for the weight goal.;Weight Management: Provide education and appropriate resources to help  participant work on and attain dietary goals.;Weight Management/Obesity: Establish reasonable short term and long term weight goals.    Expected Outcomes Short Term: Continue to assess and modify interventions until short term weight is achieved;Long Term: Adherence to nutrition and physical activity/exercise program aimed toward attainment of established weight goal;Weight Maintenance: Understanding of the daily  nutrition guidelines, which includes 25-35% calories from fat, 7% or less cal from saturated fats, less than 286m cholesterol, less than 1.5gm of sodium, & 5 or more servings of fruits and vegetables daily;Understanding recommendations for meals to include 15-35% energy as protein, 25-35% energy from fat, 35-60% energy from carbohydrates, less than 2021mof dietary cholesterol, 20-35 gm of total fiber daily;Understanding of distribution of calorie intake throughout the day with the consumption of 4-5 meals/snacks;Weight Gain: Understanding of general recommendations for a high calorie, high protein meal plan that promotes weight gain by distributing calorie intake throughout the day with the consumption for 4-5 meals, snacks, and/or supplements    Diabetes Yes    Intervention Provide education about proper nutrition, including hydration, and aerobic/resistive exercise prescription along with prescribed medications to achieve blood glucose in normal ranges: Fasting glucose 65-99 mg/dL;Provide education about signs/symptoms and action to take for hypo/hyperglycemia.    Expected Outcomes Short Term: Participant verbalizes understanding of the signs/symptoms and immediate care of hyper/hypoglycemia, proper foot care and importance of medication, aerobic/resistive exercise and nutrition plan for blood glucose control.;Long Term: Attainment of HbA1C < 7%.    Hypertension Yes    Intervention Provide education on lifestyle modifcations including regular physical activity/exercise, weight management, moderate sodium restriction and increased consumption of fresh fruit, vegetables, and low fat dairy, alcohol moderation, and smoking cessation.;Monitor prescription use compliance.    Expected Outcomes Short Term: Continued assessment and intervention until BP is < 140/9052mG in hypertensive participants. < 130/53m64m in hypertensive participants with diabetes, heart failure or chronic kidney disease.;Long Term:  Maintenance of blood pressure at goal levels.    Lipids Yes    Intervention Provide education and support for participant on nutrition & aerobic/resistive exercise along with prescribed medications to achieve LDL <70mg90mL >40mg.54mExpected Outcomes Short Term: Participant states understanding of desired cholesterol values and is compliant with medications prescribed. Participant is following exercise prescription and nutrition guidelines.;Long Term: Cholesterol controlled with medications as prescribed, with individualized exercise RX and with personalized nutrition plan. Value goals: LDL < 70mg, 103m> 40 mg.           Education:Diabetes - Individual verbal and written instruction to review signs/symptoms of diabetes, desired ranges of glucose level fasting, after meals and with exercise. Acknowledge that pre and post exercise glucose checks will be done for 3 sessions at entry of program.   [facility]  Date 08/12/19  [facility] Code 1- Verbalizes Understanding      Education: Know Your Numbers and Risk Factors: -Group verbal and written instruction about important numbers in your health.  Discussion of what are risk factors and how they play a role in the disease process.  Review of Cholesterol, Blood Pressure, Diabetes, and BMI and the role they play in your overall health.   [facility]  Date 10/23/19  Educator SB  Instruction Review Code 1- Verbalizes Understanding      Core Components/Risk Factors/Patient Goals Review:   Goals and Risk Factor Review    Row Name  09/30/19 1124 10/14/19 1117 11/27/19 1122         Core Components/Risk Factors/Patient Goals Review   Personal Goals Review Weight Management/Obesity;Hypertension;Lipids;Diabetes Weight Management/Obesity;Hypertension;Lipids;Diabetes Weight Management/Obesity;Hypertension;Lipids;Diabetes      Review Victor [name] reports taking meds as directed.  He checks BG once a day in the morning and reports it has been good.  He doesnt monitor BP at home - numbers have been good at [facility].  He is feeling good with exercise. Victor [name] is doing well in rehab. His weight is mostly steady. It is a little lower than when he started.  His blood pressures have been good in class and he does not check it at home as he has not noticed any bad one.  We talked about why recording it and tracking it at home can be helpful.  He is doing well with his blood sugars and he continues to check those at home. Victor [name] has started checking BP at home once a day.  he also checks BG at home.  Weight is 162 today.     Expected Outcomes Short: continue to check fasting BG Long : manage risk factors Short: Start tracking BP at home Long; Conitnue to manage risk factors. Short: continue to monitor vitals at home Long: manage risk factors long term            Core Components/Risk Factors/Patient Goals at Discharge (Final Review):   Goals and Risk Factor Review - 11/27/19 1122      Core Components/Risk Factors/Patient Goals Review   Personal Goals Review Weight Management/Obesity;Hypertension;Lipids;Diabetes    Review Victor [name] has started checking BP at home once a day.  he also checks BG at home.  Weight is 162 today.    Expected Outcomes Short: continue to monitor vitals at home Long: manage risk factors long term           ITP Comments:  ITP Comments    Row Name 08/12/19 0841 08/19/19 1127 08/26/19 1140 09/10/19 0758 10/08/19 1659   ITP Comments Virtual Visit completed. Patient informed on EP and RD appointment and 6 Minute walk test. Patient also informed of patient health questionnaires on My Chart. Patient Verbalizes understanding. Visit diagnosis can be found in [facility] 07/29/2019. Completed 6MWT and gym orientation. Initial ITP created and sent for review to Dr. Emily Filbert, Medical Director. First full day of exercise!  Patient was  oriented to gym and equipment including functions, settings, policies, and procedures.  Patient's individual exercise prescription and treatment plan were reviewed.  All starting workloads were established based on the results of the 6 minute walk test done at initial orientation visit.  The plan for exercise progression was also introduced and progression will be customized based on patient's performance and goals. 30 Day review completed. Medical Director ITP review done, changes made as directed, and signed approval by Medical Director. 30 day review completed. ITP sent to Dr. Emily Filbert, Medical Director of Cardiac and Pulmonary Rehab. Continue with ITP unless changes are made by physician.   [place] Name 11/05/19 0720 12/03/19 0734         ITP Comments 30 Day review completed. Medical Director ITP review done, changes made as directed, and signed approval by Medical Director. 30 Day review completed. Medical Director ITP review done, changes made as directed, and signed approval by Medical Director. Sporadic attendance Last visit 11/10/2019             Comments:

## 2019-12-09 ENCOUNTER — Other Ambulatory Visit: Payer: Self-pay

## 2019-12-09 ENCOUNTER — Encounter: Payer: Medicare Other | Attending: Cardiology | Admitting: *Deleted

## 2019-12-09 DIAGNOSIS — Z952 Presence of prosthetic heart valve: Secondary | ICD-10-CM | POA: Diagnosis not present

## 2019-12-09 NOTE — Progress Notes (Signed)
Daily Session Note  Patient Details  Name: Victor [name] MRN: [id number] Date of Birth: 07-19-1931 Referring Provider:     Cardiac Rehab from 08/19/2019 in [facility] Cardiac and Pulmonary Rehab  Referring Provider [name] MD      Encounter Date: 12/09/2019  Check In:  Session Check In - 12/09/19 1141      Check-In   Supervising physician immediately available to respond to emergencies See telemetry face sheet for immediately available ER MD    Location [facility]-Cardiac & Pulmonary Rehab    Staff Present Heath Lark, RN, BSN, CCRP;Melissa Cohoe RDN, LDN;Joseph [employer], [place], ACSM CEP, Exercise Physiologist    Virtual Visit No    Medication changes reported     No    Fall or balance concerns reported    No    Warm-up and Cool-down Performed on first and last piece of equipment    Resistance Training Performed Yes    VAD Patient? No    PAD/SET Patient? No      Pain Assessment   Currently in Pain? No/denies              Social History   Tobacco Use  Smoking Status Never Smoker  Smokeless Tobacco Former Systems developer  . Types: Chew    Goals Met:  Independence with exercise equipment Exercise tolerated well No report of cardiac concerns or symptoms  Goals Unmet:  Not Applicable  Comments: Pt able to follow exercise prescription today without complaint.  Will continue to monitor for progression.    Dr. Emily Filbert is Medical Director for [place] and [facility].

## 2019-12-11 ENCOUNTER — Other Ambulatory Visit: Payer: Self-pay

## 2019-12-11 DIAGNOSIS — Z952 Presence of prosthetic heart valve: Secondary | ICD-10-CM | POA: Diagnosis not present

## 2019-12-11 NOTE — Progress Notes (Signed)
Daily Session Note  Patient Details  Name: Victor [name] MRN: [id number] Date of Birth: 10-03-1931 Referring Provider:     Cardiac Rehab from 08/19/2019 in [facility] Cardiac and Pulmonary Rehab  Referring Provider [name] MD      Encounter Date: 12/11/2019  Check In:  Session Check In - 12/11/19 1120      Check-In   Supervising physician immediately available to respond to emergencies See telemetry face sheet for immediately available ER MD    Location [facility]-Cardiac & Pulmonary Rehab    Staff Present Birdie Sons, MPA, RN;Meredith Sherryll Burger, RN BSN;Joseph Darrin Nipper, MA, RCEP, CCRP, CCET    Virtual Visit No    Medication changes reported     No    Fall or balance concerns reported    No    Warm-up and Cool-down Performed on first and last piece of equipment    Resistance Training Performed Yes    VAD Patient? No    PAD/SET Patient? No      Pain Assessment   Currently in Pain? No/denies              Social History   Tobacco Use  Smoking Status Never Smoker  Smokeless Tobacco Former Systems developer  . Types: Chew    Goals Met:  Independence with exercise equipment Exercise tolerated well No report of cardiac concerns or symptoms Strength training completed today  Goals Unmet:  Not Applicable  Comments: Pt able to follow exercise prescription today without complaint.  Will continue to monitor for progression.    Dr. Emily Filbert is Medical Director for [place] and [facility].

## 2019-12-16 ENCOUNTER — Other Ambulatory Visit: Payer: Self-pay

## 2019-12-16 ENCOUNTER — Encounter: Payer: Medicare Other | Admitting: *Deleted

## 2019-12-16 DIAGNOSIS — Z952 Presence of prosthetic heart valve: Secondary | ICD-10-CM

## 2019-12-16 NOTE — Progress Notes (Signed)
Daily Session Note  Patient Details  Name: Victor [name] MRN: [id number] Date of Birth: [birth year] Referring Provider:     Cardiac Rehab from 08/19/2019 in [facility] Cardiac and Pulmonary Rehab  Referring Provider Victor Hew MD      Encounter Date: 12/16/2019  Check In:  Session Check In - 12/16/19 1140      Check-In   Supervising physician immediately available to respond to emergencies See telemetry face sheet for immediately available ER MD    Location [facility]-Cardiac & Pulmonary Rehab    Staff Present Victor Lark, RN, BSN, CCRP;Victor [name], BA, ACSM CEP, Exercise Physiologist;Victor [name] RDN, LDN;Victor [name] [place]    Virtual Visit No    Medication changes reported     No    Fall or balance concerns reported    No    Warm-up and Cool-down Performed on first and last piece of equipment    Resistance Training Performed Yes    VAD Patient? No    PAD/SET Patient? No      Pain Assessment   Currently in Pain? No/denies              Social History   Tobacco Use  Smoking Status Never Smoker  Smokeless Tobacco Former Systems developer  . Types: Chew    Goals Met:  Independence with exercise equipment Exercise tolerated well No report of cardiac concerns or symptoms  Goals Unmet:  Not Applicable  Comments: Pt able to follow exercise prescription today without complaint.  Will continue to monitor for progression.    Dr. Emily [name] is Medical Director for [place] and [facility].

## 2019-12-18 ENCOUNTER — Ambulatory Visit: Payer: Medicare Other | Admitting: Podiatry

## 2019-12-18 ENCOUNTER — Other Ambulatory Visit: Payer: Self-pay

## 2019-12-18 DIAGNOSIS — Z952 Presence of prosthetic heart valve: Secondary | ICD-10-CM

## 2019-12-18 NOTE — Progress Notes (Signed)
Daily Session Note  Patient Details  Name: ABDULKAREEM [name] MRN: [id number] Date of Birth: 1931/03/15 Referring Provider:     Cardiac Rehab from 08/19/2019 in [facility] Cardiac and Pulmonary Rehab  Referring Provider Victor Hew MD      Encounter Date: 12/18/2019  Check In:  Session Check In - 12/18/19 1040      Check-In   Supervising physician immediately available to respond to emergencies See telemetry face sheet for immediately available ER MD    Location [facility]-Cardiac & Pulmonary Rehab    Staff Present Birdie Sons, MPA, RN;Melissa Caiola RDN, Rowe Pavy, BA, ACSM CEP, Exercise Physiologist;Jessica [place], MA, RCEP, CCRP, CCET    Virtual Visit No    Medication changes reported     No    Fall or balance concerns reported    No    Warm-up and Cool-down Performed on first and last piece of equipment    Resistance Training Performed Yes    VAD Patient? No    PAD/SET Patient? No      Pain Assessment   Currently in Pain? No/denies              Social History   Tobacco Use  Smoking Status Never Smoker  Smokeless Tobacco Former Systems developer  . Types: Chew    Goals Met:  Independence with exercise equipment Exercise tolerated well No report of cardiac concerns or symptoms Strength training completed today  Goals Unmet:  Not Applicable  Comments: Pt able to follow exercise prescription today without complaint.  Will continue to monitor for progression.    Dr. Emily [name] is Medical Director for [place] and [facility].

## 2019-12-18 NOTE — Patient Instructions (Signed)
Discharge Patient Instructions  Patient Details  Name: Victor [name] MRN: [id number] Date of Birth: November 28, 1931 Referring Provider:  Leonie Man, MD   Number of Visits: 36  Reason for Discharge:  Patient reached a stable level of exercise. Patient independent in their exercise. Patient has met program and personal goals.  Smoking History:  Social History   Tobacco Use  Smoking Status Never Smoker  Smokeless Tobacco Former User   Types: Chew    Diagnosis:  S/P TAVR (transcatheter aortic valve replacement)  Initial Exercise Prescription:  Initial Exercise Prescription - 08/19/19 1100      Date of Initial Exercise RX and Referring Provider   Date 08/19/19    Referring Provider [name] MD      Treadmill   MPH 1.7    Grade 0    Minutes 15    METs 2.3      Recumbant Bike   Level 1    RPM 50    Watts 5    Minutes 15    METs 1.7      NuStep   Level 1    SPM 80    Minutes 15    METs 1.7      Prescription Details   Frequency (times per week) 2    Duration Progress to 30 minutes of continuous aerobic without signs/symptoms of physical distress      Intensity   THRR 40-80% of Max Heartrate 112-127    Ratings of Perceived Exertion 11-13    Perceived Dyspnea 0-4      Progression   Progression Continue to progress workloads to maintain intensity without signs/symptoms of physical distress.      Resistance Training   Training Prescription Yes    Weight 3 lb    Reps 10-15           Discharge Exercise Prescription (Final Exercise Prescription Changes):  Exercise Prescription Changes - 12/09/19 1300      Response to Exercise   Blood Pressure (Admit) 104/60    Blood Pressure (Exercise) 110/62    Blood Pressure (Exit) 100/54    Heart Rate (Admit) 70 bpm    Heart Rate (Exercise) 105 bpm    Heart Rate (Exit) 91 bpm    Rating of Perceived Exertion (Exercise) 12    Symptoms none    Duration Continue with 30 min of aerobic exercise without  signs/symptoms of physical distress.    Intensity THRR unchanged      Progression   Progression Continue to progress workloads to maintain intensity without signs/symptoms of physical distress.    Average METs 2.1      Resistance Training   Training Prescription Yes    Weight 3 lb    Reps 10-15      Interval Training   Interval Training No      Treadmill   MPH 21    Grade 0    Minutes 15    METs 2.61      REL-XR   Level 1    Minutes 15    METs 1.6      Home Exercise Plan   Plans to continue exercise at [employer] (comment)   [employer]   Frequency Add 2 additional days to program exercise sessions.    Initial Home Exercises Provided 10/14/19           Functional Capacity:  6 Minute Walk    Row Name 08/19/19 1127 12/09/19 1232  6 Minute Walk   Phase Initial Discharge    Distance 935 feet 1245 feet    Distance % Change -- 33 %    Distance Feet Change -- 310 ft    Walk Time 6 minutes 6 minutes    # of Rest Breaks 0 0    MPH 1.77 2.36    METS 1.78 2    RPE 9 12    Perceived Dyspnea  -- 0    VO2 Peak 6.23 --    Symptoms No No    Resting HR 74 bpm 70 bpm    Resting BP 130/64 104/60    Resting Oxygen Saturation  97 % 95 %    Exercise Oxygen Saturation  during 6 min walk 95 % --    Max Ex. HR 97 bpm --    Max Ex. BP 146/74 110/62    2 Minute Post BP 132/70 --            Nutrition & Weight - Outcomes:  Pre Biometrics - 08/19/19 1132      Pre Biometrics   Height 5' 8.7" (1.745 m)    Weight 159 lb 4.8 oz (72.3 kg)    BMI (Calculated) 23.73    Single Leg Stand 2.69 seconds            Nutrition:  Nutrition Therapy & Goals - 09/02/19 1134      Nutrition Therapy   Diet Heart healthy, low Na, T2DM    Protein (specify units) 55-60g    Fiber 30 grams    Whole Grain Foods 3 servings    Saturated Fats 12 max. grams    Fruits and Vegetables 5 servings/day    Sodium 1.5 grams      Personal Nutrition Goals   Nutrition Goal ST: have  a sandwich at home instead of going out to eat? add vegetables to lunch and breakfast, eat snack closer to rehab time or have larger snack to maintain BG during exercise  LT: eat in a way that helps his heart and BG    Comments Last A1C was around 6.6. BG ~120-135. No insulin.Drinks water and sometimes diet coke. B: Ham and egg, grits and dry wheat toast out, when home have a sweet toast and peanut butter and an apple or banana. Weekend - egg white omelette with ham on it and hashbrowns and dry wheat L: Eats lunch out at [employer], [employer] - chicken sandwich, subway wheat bread with [place] and lots of spinach S: boost. D: wife cooks chicken, green beans, potatoes, salads. Uses salt at the table (light salt), wife does not cook with it. Eats red meat 1x/week. Uses canola oil. Discussed heart healthy and T2DM eating.      Intervention Plan   Intervention Prescribe, educate and counsel regarding individualized specific dietary modifications aiming towards targeted core components such as weight, hypertension, lipid management, diabetes, heart failure and other comorbidities.;Nutrition handout(s) given to patient.    Expected Outcomes Short Term Goal: A plan has been developed with personal nutrition goals set during dietitian appointment.;Short Term Goal: Understand basic principles of dietary content, such as calories, fat, sodium, cholesterol and nutrients.;Long Term Goal: Adherence to prescribed nutrition plan.          Goals reviewed with patient; copy given to patient.

## 2019-12-22 ENCOUNTER — Encounter: Payer: Self-pay | Admitting: Podiatry

## 2019-12-22 ENCOUNTER — Ambulatory Visit (INDEPENDENT_AMBULATORY_CARE_PROVIDER_SITE_OTHER): Payer: Medicare Other | Admitting: Podiatry

## 2019-12-22 ENCOUNTER — Other Ambulatory Visit: Payer: Self-pay

## 2019-12-22 DIAGNOSIS — M79676 Pain in unspecified toe(s): Secondary | ICD-10-CM

## 2019-12-22 DIAGNOSIS — B351 Tinea unguium: Secondary | ICD-10-CM

## 2019-12-22 DIAGNOSIS — D689 Coagulation defect, unspecified: Secondary | ICD-10-CM

## 2019-12-22 NOTE — Progress Notes (Signed)
This patient returns to my office for at risk foot care.  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  Patient is taking plavix.  Patient has been in the hospital for heart surgery. This patient is unable to cut nails himself since the patient cannot reach his nails.These nails are painful walking and wearing shoes.  This patient presents for at risk foot care today.  General Appearance  Alert, conversant and in no acute stress.  Vascular  Dorsalis pedis and posterior tibial  pulses are weakly palpable  bilaterally.  Capillary return is within normal limits  bilaterally. Temperature is within normal limits  bilaterally.  Neurologic  Senn-Weinstein monofilament wire test within normal limits  bilaterally. Muscle power within normal limits bilaterally.  Nails Thick disfigured discolored nails with subungual debris  from hallux to fifth toes bilaterally. No evidence of bacterial infection or drainage bilaterally.  Orthopedic  No limitations of motion  feet .  No crepitus or effusions noted.  No bony pathology or digital deformities noted. Midfoot  DJD  B/L.  HAV  B/L  Tailors bunion  B/L.  Skin  normotropic skin with no porokeratosis noted bilaterally.  No signs of infections or ulcers noted.     Onychomycosis    B/L.      Mechanical debridement of nails 1-5  bilaterally performed with a nail nipper.  Filed with dremel without incident.   Return office visit   3 months                   Told patient to return for periodic foot care and evaluation due to potential at risk complications.   Gardiner Barefoot DPM

## 2019-12-23 ENCOUNTER — Encounter: Payer: Medicare Other | Admitting: *Deleted

## 2019-12-23 DIAGNOSIS — Z952 Presence of prosthetic heart valve: Secondary | ICD-10-CM | POA: Diagnosis not present

## 2019-12-23 NOTE — Progress Notes (Signed)
Discharge Progress Report  Patient Details  Name: Victor [name] MRN: [id number] Date of Birth: 1931/02/08 Referring Provider:     Cardiac Rehab from 08/19/2019 in [facility] Cardiac and Pulmonary Rehab  Referring Provider [name] MD       Number of Visits: 36  Reason for Discharge:  Patient reached a stable level of exercise. Patient independent in their exercise.  Smoking History:  Social History   Tobacco Use  Smoking Status Never Smoker  Smokeless Tobacco Former Systems developer  . Types: Chew    Diagnosis:  S/P TAVR (transcatheter aortic valve replacement)  ADL UCSD:   Initial Exercise Prescription:  Initial Exercise Prescription - 08/19/19 1100      Date of Initial Exercise RX and Referring Provider   Date 08/19/19    Referring Provider [name] MD      Treadmill   MPH 1.7    Grade 0    Minutes 15    METs 2.3      Recumbant Bike   Level 1    RPM 50    Watts 5    Minutes 15    METs 1.7      NuStep   Level 1    SPM 80    Minutes 15    METs 1.7      Prescription Details   Frequency (times per week) 2    Duration Progress to 30 minutes of continuous aerobic without signs/symptoms of physical distress      Intensity   THRR 40-80% of Max Heartrate 112-127    Ratings of Perceived Exertion 11-13    Perceived Dyspnea 0-4      Progression   Progression Continue to progress workloads to maintain intensity without signs/symptoms of physical distress.      Resistance Training   Training Prescription Yes    Weight 3 lb    Reps 10-15           Discharge Exercise Prescription (Final Exercise Prescription Changes):  Exercise Prescription Changes - 12/09/19 1300      Response to Exercise   Blood Pressure (Admit) 104/60    Blood Pressure (Exercise) 110/62    Blood Pressure (Exit) 100/54    Heart Rate (Admit) 70 bpm    Heart Rate (Exercise) 105 bpm    Heart Rate (Exit) 91 bpm    Rating of Perceived Exertion (Exercise) 12    Symptoms none     Duration Continue with 30 min of aerobic exercise without signs/symptoms of physical distress.    Intensity THRR unchanged      Progression   Progression Continue to progress workloads to maintain intensity without signs/symptoms of physical distress.    Average METs 2.1      Resistance Training   Training Prescription Yes    Weight 3 lb    Reps 10-15      Interval Training   Interval Training No      Treadmill   MPH 21    Grade 0    Minutes 15    METs 2.61      REL-XR   Level 1    Minutes 15    METs 1.6      Home Exercise Plan   Plans to continue exercise at [employer] (comment)   [employer]   Frequency Add 2 additional days to program exercise sessions.    Initial Home Exercises Provided 10/14/19           Functional Capacity:  6 Minute  Walk    Row Name 08/19/19 1127 12/09/19 1232       6 Minute Walk   Phase Initial Discharge    Distance 935 feet 1245 feet    Distance % Change -- 33 %    Distance Feet Change -- 310 ft    Walk Time 6 minutes 6 minutes    # of Rest Breaks 0 0    MPH 1.77 2.36    METS 1.78 2    RPE 9 12    Perceived Dyspnea  -- 0    VO2 Peak 6.23 --    Symptoms No No    Resting HR 74 bpm 70 bpm    Resting BP 130/64 104/60    Resting Oxygen Saturation  97 % 95 %    Exercise Oxygen Saturation  during 6 min walk 95 % --    Max Ex. HR 97 bpm --    Max Ex. BP 146/74 110/62    2 Minute Post BP 132/70 --                Nutrition:  Nutrition Therapy & Goals - 09/02/19 1134      Nutrition Therapy   Diet Heart healthy, low Na, T2DM    Protein (specify units) 55-60g    Fiber 30 grams    Whole Grain Foods 3 servings    Saturated Fats 12 max. grams    Fruits and Vegetables 5 servings/day    Sodium 1.5 grams      Personal Nutrition Goals   Nutrition Goal ST: have a sandwich at home instead of going out to eat? add vegetables to lunch and breakfast, eat snack closer to rehab time or have larger snack to maintain BG during  exercise  LT: eat in a way that helps his heart and BG    Comments Last A1C was around 6.6. BG ~120-135. No insulin.Drinks water and sometimes diet coke. B: Ham and egg, grits and dry wheat toast out, when home have a sweet toast and peanut butter and an apple or banana. Weekend - egg white omelette with ham on it and hashbrowns and dry wheat L: Eats lunch out at [employer], [employer] - chicken sandwich, subway wheat bread with [place] and lots of spinach S: boost. D: wife cooks chicken, green beans, potatoes, salads. Uses salt at the table (light salt), wife does not cook with it. Eats red meat 1x/week. Uses canola oil. Discussed heart healthy and T2DM eating.      Intervention Plan   Intervention Prescribe, educate and counsel regarding individualized specific dietary modifications aiming towards targeted core components such as weight, hypertension, lipid management, diabetes, heart failure and other comorbidities.;Nutrition handout(s) given to patient.    Expected Outcomes Short Term Goal: A plan has been developed with personal nutrition goals set during dietitian appointment.;Short Term Goal: Understand basic principles of dietary content, such as calories, fat, sodium, cholesterol and nutrients.;Long Term Goal: Adherence to prescribed nutrition plan.           Nutrition Discharge:  Nutrition Assessments - 12/16/19 1150      MEDFICTS Scores   Pre Score 56    Post Score 13    Score Difference -43

## 2019-12-23 NOTE — Progress Notes (Signed)
Daily Session Note  Patient Details  Name: Victor [name] MRN: [id number] Date of Birth: 04/24/1931 Referring Provider:     Cardiac Rehab from 08/19/2019 in [facility] Cardiac and Pulmonary Rehab  Referring Provider [name] MD      Encounter Date: 12/23/2019  Check In:  Session Check In - 12/23/19 1400      Check-In   Supervising physician immediately available to respond to emergencies See telemetry face sheet for immediately available ER MD    Location [facility]-Cardiac & Pulmonary Rehab    Staff Present Heath Lark, RN, BSN, CCRP;Jessica [place], MA, RCEP, CCRP, Marylynn Pearson, MS Exercise Physiologist;Amanda Oletta Darter, [place], ACSM CEP, Exercise Physiologist    Virtual Visit No    Medication changes reported     No    Fall or balance concerns reported    No    Warm-up and Cool-down Performed on first and last piece of equipment    Resistance Training Performed Yes    VAD Patient? No    PAD/SET Patient? No      Pain Assessment   Currently in Pain? No/denies              Social History   Tobacco Use  Smoking Status Never Smoker  Smokeless Tobacco Former Systems developer  . Types: Chew    Goals Met:  Independence with exercise equipment Exercise tolerated well Personal goals reviewed No report of cardiac concerns or symptoms  Goals Unmet:  Not Applicable  Comments:  Layla graduated today from  rehab with 36 sessions completed.  Details of the patient's exercise prescription and what He needs to do in order to continue the prescription and progress were discussed with patient.  Patient was given a copy of prescription and goals.  Patient verbalized understanding.  Vontae plans to continue to exercise by continuing his days at the local gym.    Dr. Emily Filbert is Medical Director for [place] and [facility].

## 2019-12-23 NOTE — Progress Notes (Signed)
Cardiac Individual Treatment Plan  Patient Details  Name: DELYNN PURSLEY MRN: [id number] Date of Birth: 1931-08-01 Referring Provider:     Cardiac Rehab from 08/19/2019 in [facility] Cardiac and Pulmonary Rehab  Referring Provider [name] MD      Initial Encounter Date:    Cardiac Rehab from 08/19/2019 in [facility] Cardiac and Pulmonary Rehab  Date 08/19/19      Visit Diagnosis: S/P TAVR (transcatheter aortic valve replacement)  Patient's Home Medications on Admission:  Current Outpatient Medications:    acetaminophen (TYLENOL) 500 MG tablet, Take 500 mg by mouth every 6 (six) hours as needed for moderate pain or headache., Disp: , Rfl:    Artificial Tear Solution (GENTEAL TEARS OP), Place 1 drop into both eyes daily., Disp: , Rfl:    ascorbic acid (VITAMIN C) 500 MG tablet, Take 1,000-1,500 mg by mouth See admin instructions. Take 1000 mg in the morning and 1500 mg at night, Disp: , Rfl:    aspirin EC 81 MG tablet, Take 81 mg by mouth daily., Disp: , Rfl:    benzonatate (TESSALON) 200 MG capsule, Take 200 mg by mouth 3 (three) times daily as needed for cough., Disp: , Rfl:    bismuth subsalicylate (PEPTO BISMOL) 262 MG/15ML suspension, Take 30 mLs by mouth every 6 (six) hours as needed for indigestion or diarrhea or loose stools., Disp: , Rfl:    calcium carbonate (OS-CAL) 600 MG TABS tablet, Take 600 mg by mouth daily., Disp: , Rfl:    cetirizine (ZYRTEC) 10 MG tablet, Take 10 mg by mouth daily., Disp: , Rfl:    Cholecalciferol (VITAMIN D3) 2000 UNITS TABS, Take 4,000 Units by mouth 2 (two) times daily. , Disp: , Rfl:    CINNAMON PO, Take 1,000 mg by mouth 2 (two) times daily., Disp: , Rfl:    clindamycin (CLEOCIN) 300 MG capsule, Take 2 capsules (600 mg) 1 hour prior to all dental visits., Disp: 4 capsule, Rfl: 8   clopidogrel (PLAVIX) 75 MG tablet, Take 1 tablet (75 mg total) by mouth daily with breakfast., Disp: 90 tablet, Rfl: 1   Coenzyme Q10 (COQ-10) 100 MG CAPS, Take  100 mg by mouth daily., Disp: , Rfl:    Cyanocobalamin (B-12) 2500 MCG TABS, Take 2,500 mcg by mouth daily., Disp: , Rfl:    donepezil (ARICEPT) 10 MG tablet, Take 10 mg by mouth daily., Disp: , Rfl:    EPIPEN 2-PAK 0.3 MG/0.3ML SOAJ injection, Inject 0.3 mLs as directed as needed for anaphylaxis. , Disp: , Rfl:    ezetimibe (ZETIA) 10 MG tablet, Take 10 mg by mouth daily., Disp: , Rfl:    fluticasone (FLONASE) 50 MCG/ACT nasal spray, Place 2 sprays into both nostrils 2 (two) times daily as needed for allergies. , Disp: , Rfl:    gabapentin (NEURONTIN) 100 MG capsule, Take 300 mg by mouth at bedtime. , Disp: , Rfl:    glipiZIDE (GLUCOTROL XL) 2.5 MG 24 hr tablet, Take 2.5 mg by mouth daily with breakfast., Disp: , Rfl:    Glucosamine HCl 1000 MG TABS, Take 1,000 mg by mouth 2 (two) times daily., Disp: , Rfl:    ipratropium (ATROVENT) 0.03 % nasal spray, Place 2 sprays into both nostrils 2 (two) times daily as needed for rhinitis., Disp: , Rfl:    loperamide (IMODIUM A-D) 2 MG tablet, Take 2 mg by mouth daily as needed for diarrhea or loose stools., Disp: , Rfl:    methocarbamol (ROBAXIN) 500 MG tablet, Take 500  mg by mouth at bedtime as needed for muscle spasms., Disp: , Rfl:    metoprolol succinate (TOPROL-XL) 25 MG 24 hr tablet, Take 1 tablet by mouth once daily, Disp: 90 tablet, Rfl: 3   Multiple Vitamin (MULITIVITAMIN WITH MINERALS) TABS, Take 1 tablet by mouth 2 (two) times daily. , Disp: , Rfl:    Omega 3 1200 MG CAPS, Take 2,400 mg by mouth 2 (two) times daily. , Disp: , Rfl:    Probiotic CAPS, Take 1 capsule by mouth daily., Disp: , Rfl:    pseudoephedrine (SUDAFED) 30 MG tablet, Take 60 mg by mouth every 4 (four) hours as needed for congestion., Disp: , Rfl:    Saw Palmetto 450 MG CAPS, Take 900 mg by mouth daily. , Disp: , Rfl:    simvastatin (ZOCOR) 40 MG tablet, Take 40 mg by mouth at bedtime., Disp: , Rfl:    triamcinolone cream (KENALOG) 0.1 %, Apply 1 application  topically 3 (three) times daily as needed for itching., Disp: , Rfl:    zinc gluconate 50 MG tablet, Take 50 mg by mouth daily., Disp: , Rfl:   Past Medical History: Past Medical History:  Diagnosis Date   Angina    Arthritis    Bilateral carotid artery disease ([place])    CAROTID DOPPLER,05/21/2008 - Right and left ICA-0-49% diameter reduction, left CCA-0-49% diameter reductiion   BPH (benign prostatic hypertrophy)    CAD S/P percutaneous coronary angioplasty 07/2006   PCI to RCA - Promus DES 2.5 mm x 23 mm; 2D ECHO - EF >55%, moderate calcification of the aortic valve leaflets   Diabetes mellitus    Diverticulitis    s/p colectomy   GERD (gastroesophageal reflux disease)    High cholesterol    History of kidney stones    per patient, "a very long time ago and it passed by itself"   Hypertension    "from the diabetes"   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   s/p TAVR with a 29 mm Edwards Sapien 3 via the TF approach with Drs [name] & Roxy Manns    Severe aortic stenosis    Squamous cell skin cancer, nasal tip     Tobacco Use: Social History   Tobacco Use  Smoking Status Never Smoker  Smokeless Tobacco Former Systems developer   Types: Chew    Labs: Recent Chemical engineer    Labs for ITP Cardiac and Pulmonary Rehab Latest Ref Rng & Units 07/25/2019 07/29/2019 07/29/2019 07/29/2019 07/29/2019   Cholestrol 0 - 200 mg/dL - - - - -   LDLCALC 0 - 99 mg/dL - - - - -   HDL >39 mg/dL - - - - -   Trlycerides <150 mg/dL - - - - -   Hemoglobin A1c 4.8 - 5.6 % 6.4(H) - - - -   PHART 7.35 - 7.45 - 7.426 7.433 - -   PCO2ART 32 - 48 mmHg - 42.5 43.8 - -   HCO3 20.0 - 28.0 mmol/L - 27.5 29.3(H) - -   TCO2 22 - 32 mmol/L - - '31 29 25   ' O2SAT % - 95.3 100.0 - -       Exercise Target Goals: Exercise Program Goal: Individual exercise prescription set using results from initial 6 min walk test and THRR while considering  patients activity barriers and safety.    Exercise Prescription Goal: Initial exercise prescription builds to 30-45 minutes a day of aerobic activity, 2-3 days per week.  Home exercise  guidelines will be given to patient during program as part of exercise prescription that the participant will acknowledge.   Education: Aerobic Exercise: - Group verbal and visual presentation on the components of exercise prescription. Introduces F.I.T.T principle from ACSM for exercise prescriptions.  Reviews F.I.T.T. principles of aerobic exercise including progression. Written material given at graduation.   Cardiac Rehab from 12/23/2019 in [facility] Cardiac and Pulmonary Rehab  Date 11/27/19  Hilda Blades 09/25/19 & on 10/28,  aerobic 10/21]  Educator [facility]  Instruction Review Code 1- Verbalizes Understanding      Education: Resistance Exercise: - Group verbal and visual presentation on the components of exercise prescription. Introduces F.I.T.T principle from ACSM for exercise prescriptions  Reviews F.I.T.T. principles of resistance exercise including progression. Written material given at graduation.    Education: Exercise & Equipment Safety: - Individual verbal instruction and demonstration of equipment use and safety with use of the equipment.   Cardiac Rehab from 12/23/2019 in [facility] Cardiac and Pulmonary Rehab  Date 08/12/19  Educator [facility]  Instruction Review Code 1- Verbalizes Understanding      Education: Exercise Physiology & General Exercise Guidelines: - Group verbal and written instruction with models to review the exercise physiology of the cardiovascular system and associated critical values. Provides general exercise guidelines with specific guidelines to those with heart or lung disease.    Cardiac Rehab from 12/23/2019 in [facility] Cardiac and Pulmonary Rehab  Date 08/19/19  Instruction Review Code 3- Needs Reinforcement  [need identified]      Education: Flexibility, Balance, Mind/Body Relaxation: - Group verbal and visual  presentation with interactive activity on the components of exercise prescription. Introduces F.I.T.T principle from ACSM for exercise prescriptions. Reviews F.I.T.T. principles of flexibility and balance exercise training including progression. Also discusses the mind body connection.  Reviews various relaxation techniques to help reduce and manage stress (i.e. Deep breathing, progressive muscle relaxation, and visualization). Balance handout provided to take home. Written material given at graduation.   Cardiac Rehab from 12/23/2019 in [facility] Cardiac and Pulmonary Rehab  Date 10/02/19  Educator [facility]  Instruction Review Code 1- Verbalizes Understanding      Activity Barriers & Risk Stratification:  Activity Barriers & Cardiac Risk Stratification - 08/19/19 1127      Activity Barriers & Cardiac Risk Stratification   Activity Barriers Arthritis;Back Problems;Deconditioning;Muscular Weakness;Balance Concerns   occasional back pain, arth in hands   Cardiac Risk Stratification High           6 Minute Walk:  6 Minute Walk    Row Name 08/19/19 1127 12/09/19 1232       6 Minute Walk   Phase Initial Discharge    Distance 935 feet 1245 feet    Distance % Change -- 33 %    Distance Feet Change -- 310 ft    Walk Time 6 minutes 6 minutes    # of Rest Breaks 0 0    MPH 1.77 2.36    METS 1.78 2    RPE 9 12    Perceived Dyspnea  -- 0    VO2 Peak 6.23 --    Symptoms No No    Resting HR 74 bpm 70 bpm    Resting BP 130/64 104/60    Resting Oxygen Saturation  97 % 95 %    Exercise Oxygen Saturation  during 6 min walk 95 % --    Max Ex. HR 97 bpm --    Max Ex. BP 146/74 110/62    2 Minute  Post BP 132/70 --           Oxygen Initial Assessment:   Oxygen Re-Evaluation:   Oxygen Discharge (Final Oxygen Re-Evaluation):   Initial Exercise Prescription:  Initial Exercise Prescription - 08/19/19 1100      Date of Initial Exercise RX and Referring Provider   Date 08/19/19    Referring  Provider [name] MD      Treadmill   MPH 1.7    Grade 0    Minutes 15    METs 2.3      Recumbant Bike   Level 1    RPM 50    Watts 5    Minutes 15    METs 1.7      NuStep   Level 1    SPM 80    Minutes 15    METs 1.7      Prescription Details   Frequency (times per week) 2    Duration Progress to 30 minutes of continuous aerobic without signs/symptoms of physical distress      Intensity   THRR 40-80% of Max Heartrate 112-127    Ratings of Perceived Exertion 11-13    Perceived Dyspnea 0-4      Progression   Progression Continue to progress workloads to maintain intensity without signs/symptoms of physical distress.      Resistance Training   Training Prescription Yes    Weight 3 lb    Reps 10-15           Perform Capillary Blood Glucose checks as needed.  Exercise Prescription Changes:  Exercise Prescription Changes    Row Name 08/19/19 1100 09/03/19 1700 09/17/19 1300 09/30/19 1200 10/14/19 1300     Response to Exercise   Blood Pressure (Admit) 130/64 112/62 120/60 142/70 104/66   Blood Pressure (Exercise) 146/74 120/60 128/58 110/60 122/60   Blood Pressure (Exit) 132/70 118/68 106/62 104/50 122/64   Heart Rate (Admit) 74 bpm 87 bpm 93 bpm 91 bpm 89 bpm   Heart Rate (Exercise) 97 bpm 106 bpm 104 bpm 99 bpm 106 bpm   Heart Rate (Exit) 69 bpm 82 bpm 75 bpm 83 bpm 85 bpm   Oxygen Saturation (Admit) 97 % -- -- -- --   Oxygen Saturation (Exercise) 95 % -- -- -- --   Rating of Perceived Exertion (Exercise) '9 11 12 12 13   ' Symptoms none none none none none   Comments walk test results -- -- -- --   Duration -- Progress to 30 minutes of  aerobic without signs/symptoms of physical distress Progress to 30 minutes of  aerobic without signs/symptoms of physical distress Continue with 30 min of aerobic exercise without signs/symptoms of physical distress. Continue with 30 min of aerobic exercise without signs/symptoms of physical distress.   Intensity -- THRR  unchanged THRR unchanged THRR unchanged THRR unchanged     Progression   Progression -- Continue to progress workloads to maintain intensity without signs/symptoms of physical distress. Continue to progress workloads to maintain intensity without signs/symptoms of physical distress. Continue to progress workloads to maintain intensity without signs/symptoms of physical distress. Continue to progress workloads to maintain intensity without signs/symptoms of physical distress.   Average METs -- 2.36 2.55 2.56 2.4     Resistance Training   Training Prescription -- Yes Yes Yes Yes   Weight -- 3 lb 3 lb 3 lb 3 lb   Reps -- 10-15 10-15 10-15 10-15     Interval Training   Interval Training --  No No No No     Treadmill   MPH -- -- 1.7 2 --   Grade -- -- 0 0 --   Minutes -- -- 15 15 --   METs -- -- 2.3 2.53 --     Recumbant Bike   Level -- 1 -- -- 3   Watts -- 5 -- -- --   Minutes -- 15 -- -- 15   METs -- 2.22 -- -- 2.21     NuStep   Level -- '1 1 3 3   ' SPM -- -- 80 -- 80   Minutes -- '15 15 15 15   ' METs -- 2.5 -- 2.6 2.6   Row Name 10/29/19 1700 11/12/19 1400 11/26/19 1400 12/09/19 1300       Response to Exercise   Blood Pressure (Admit) 100/64 116/60 110/60 104/60    Blood Pressure (Exercise) 128/64 134/64 122/68 110/62    Blood Pressure (Exit) 124/64 122/60 96/64 100/54    Heart Rate (Admit) 70 bpm 65 bpm 88 bpm 70 bpm    Heart Rate (Exercise) 120 bpm 93 bpm 100 bpm 105 bpm    Heart Rate (Exit) 94 bpm 66 bpm 90 bpm 91 bpm    Rating of Perceived Exertion (Exercise) -- -- 12 12    Symptoms none none none none    Duration Continue with 30 min of aerobic exercise without signs/symptoms of physical distress. Continue with 30 min of aerobic exercise without signs/symptoms of physical distress. Continue with 30 min of aerobic exercise without signs/symptoms of physical distress. Continue with 30 min of aerobic exercise without signs/symptoms of physical distress.    Intensity THRR  unchanged THRR unchanged THRR unchanged THRR unchanged      Progression   Progression Continue to progress workloads to maintain intensity without signs/symptoms of physical distress. Continue to progress workloads to maintain intensity without signs/symptoms of physical distress. Continue to progress workloads to maintain intensity without signs/symptoms of physical distress. Continue to progress workloads to maintain intensity without signs/symptoms of physical distress.    Average METs 2.5 2.4 2.06 2.1      Resistance Training   Training Prescription Yes Yes Yes Yes    Weight 3 lb 3 lb 3 lb 3 lb    Reps 10-15 10-15 10-15 10-15      Interval Training   Interval Training No No No No      Treadmill   MPH 2 2.1 2.1 21    Grade 0 0 0 0    Minutes '15 15 15 15    ' METs 2.53 2.61 2.61 2.61      Recumbant Bike   Level -- -- 3 --    Minutes -- -- 15 --      NuStep   Level 3 -- 3 --    SPM 80 -- -- --    Minutes 15 -- 15 --      REL-XR   Level -- -- -- 1    Minutes -- -- -- 15    METs -- -- -- 1.6      T5 Nustep   Level -- 1 -- --    SPM -- 80 -- --    Minutes -- 15 -- --    METs -- 2.2 -- --      Home Exercise Plan   Plans to continue exercise at -- -- [employer] (comment)  [employer] (comment)  [employer]    Frequency -- -- Add  2 additional days to program exercise sessions. Add 2 additional days to program exercise sessions.    Initial Home Exercises Provided -- -- 10/14/19 10/14/19           Exercise Comments:  Exercise Comments    Row Name 08/26/19 1140 12/23/19 1402         Exercise Comments First full day of exercise!  Patient was oriented to gym and equipment including functions, settings, policies, and procedures.  Patient's individual exercise prescription and treatment plan were reviewed.  All starting workloads were established based on the results of the 6 minute walk test done at initial orientation visit.  The plan for  exercise progression was also introduced and progression will be customized based on patient's performance and goals. Nathen graduated today from  rehab with 36 sessions completed.  Details of the patient's exercise prescription and what He needs to do in order to continue the prescription and progress were discussed with patient.  Patient was given a copy of prescription and goals.  Patient verbalized understanding.  Diamonte plans to continue to exercise by continuing his days at the local gym.             Exercise Goals and Review:  Exercise Goals    Row Name 08/19/19 1129             Exercise Goals   Increase Physical Activity Yes       Intervention Provide advice, education, support and counseling about physical activity/exercise needs.;Develop an individualized exercise prescription for aerobic and resistive training based on initial evaluation findings, risk stratification, comorbidities and participant's personal goals.       Expected Outcomes Short Term: Attend rehab on a regular basis to increase amount of physical activity.;Long Term: Add in home exercise to make exercise part of routine and to increase amount of physical activity.;Long Term: Exercising regularly at least 3-5 days a week.       Increase Strength and Stamina Yes       Intervention Provide advice, education, support and counseling about physical activity/exercise needs.;Develop an individualized exercise prescription for aerobic and resistive training based on initial evaluation findings, risk stratification, comorbidities and participant's personal goals.       Expected Outcomes Short Term: Increase workloads from initial exercise prescription for resistance, speed, and METs.;Short Term: Perform resistance training exercises routinely during rehab and add in resistance training at home;Long Term: Improve cardiorespiratory fitness, muscular endurance and strength as measured by increased METs and functional capacity  (6MWT)       Able to understand and use rate of perceived exertion (RPE) scale Yes       Intervention Provide education and explanation on how to use RPE scale       Expected Outcomes Short Term: Able to use RPE daily in rehab to express subjective intensity level;Long Term:  Able to use RPE to guide intensity level when exercising independently       Able to understand and use Dyspnea scale Yes       Intervention Provide education and explanation on how to use Dyspnea scale       Expected Outcomes Short Term: Able to use Dyspnea scale daily in rehab to express subjective sense of shortness of breath during exertion;Long Term: Able to use Dyspnea scale to guide intensity level when exercising independently       Knowledge and understanding of Target Heart Rate Range (THRR) Yes       Intervention Provide education and explanation  of THRR including how the numbers were predicted and where they are located for reference       Expected Outcomes Short Term: Able to state/look up THRR;Short Term: Able to use daily as guideline for intensity in rehab;Long Term: Able to use THRR to govern intensity when exercising independently       Able to check pulse independently Yes       Intervention Provide education and demonstration on how to check pulse in carotid and radial arteries.;Review the importance of being able to check your own pulse for safety during independent exercise       Expected Outcomes Short Term: Able to explain why pulse checking is important during independent exercise;Long Term: Able to check pulse independently and accurately       Understanding of Exercise Prescription Yes       Intervention Provide education, explanation, and written materials on patient's individual exercise prescription       Expected Outcomes Long Term: Able to explain home exercise prescription to exercise independently;Short Term: Able to explain program exercise prescription              Exercise Goals  Re-Evaluation :  Exercise Goals Re-Evaluation    Row Name 08/26/19 1141 09/03/19 1751 09/17/19 1318 09/30/19 1128 10/14/19 1112     Exercise Goal Re-Evaluation   Exercise Goals Review Able to understand and use rate of perceived exertion (RPE) scale;Knowledge and understanding of Target Heart Rate Range (THRR);Understanding of Exercise Prescription Able to understand and use rate of perceived exertion (RPE) scale;Knowledge and understanding of Target Heart Rate Range (THRR);Understanding of Exercise Prescription Increase Physical Activity;Increase Strength and Stamina;Able to understand and use rate of perceived exertion (RPE) scale Increase Physical Activity;Increase Strength and Stamina;Understanding of Exercise Prescription Increase Physical Activity;Increase Strength and Stamina;Understanding of Exercise Prescription   Comments Reviewed RPE and dyspnea scales, THR and program prescription with pt today.  Pt voiced understanding and was given a copy of goals to take home. Efe started his first week here at rehab and has tolerated exercise well. RPE has been within appropriate ranges. Staff will continue to monitor. Pt has tolerated exercise well.  Staff will encourage him to progress workloads on seated machines and TM. Cotton will try level 4 on NS today.  He has progressed to 4 lb weights for strength work.  Staff will monitor progress. Cotton is doing well in rehab.  He goes to [employer] on [address]. He feels that his strength and stamina are recovering.  Reviewed home exercise with pt today.  Pt plans to continue exercise at [employer] for exercise.  Reviewed THR, pulse, RPE, sign and symptoms, pulse oximetery and when to call 911 or MD.  Also discussed weather considerations and indoor options.  Pt voiced understanding.   Expected Outcomes Short: Use RPE daily to regulate intensity. Long: Follow program prescription in THR. Short: Continue attending rehab regularly Long: Increase strength/  stamina Short: increase level on T4 Long: improve overall MET level Short: continue to attend consistently Long: improve stamina overall Short: Continue to go  to [employer] and focus on getting into THR  Long: COntinue to improve stamina.   [place] Name 10/16/19 1133 10/29/19 1723 11/12/19 1413 11/26/19 1455 12/09/19 1325     Exercise Goal Re-Evaluation   Exercise Goals Review Increase Physical Activity;Increase Strength and Stamina;Understanding of Exercise Prescription Increase Physical Activity;Increase Strength and Stamina;Understanding of Exercise Prescription Increase Physical Activity;Increase Strength and Stamina;Understanding of Exercise Prescription Increase Physical Activity;Increase Strength and  Stamina;Understanding of Exercise Prescription Increase Physical Activity;Increase Strength and Stamina;Understanding of Exercise Prescription   Comments Filbert Schilder is doing well in rehab and exercising on his off days at [employer].  He feels better overall and improving his stamina. Cotton tolerates exercise well. Staff will recommend trying 4-5 lb weights for strength work. Cotton has increased speed on TM.  He is still using 3 lb weights. Cotton has been doing well in rehab. He called out today.  He is now using the XR.  We will continue to monitor his progress. Cotton will complete HT soon.  He improved his 6MWT by 33% !   Expected Outcomes Short: Continur to go to [employer] Long: Continue to improve stamina. Short: increase weight for strength Long: increase overall stamina Short: increase to 4 lb for strength work Long: improve MET level Short: Increase to 4lb weights Long: Conitnue to improve stamina. Short: complete HT Long: maintain exercise on his own   [place] Name 12/11/19 1118             Exercise Goal Re-Evaluation   Exercise Goals Review Increase Physical Activity;Increase Strength and Stamina;Understanding of Exercise Prescription       Comments Cotton improved his 6MWT by 33%!   He plans to continue to go to [employer] his three days a week like he has been doing.  He uses all of the weight machines and rides the bike for 15-20 min.       Expected Outcomes Short: Graduate  Long; Continue to exericse.              Discharge Exercise Prescription (Final Exercise Prescription Changes):  Exercise Prescription Changes - 12/09/19 1300      Response to Exercise   Blood Pressure (Admit) 104/60    Blood Pressure (Exercise) 110/62    Blood Pressure (Exit) 100/54    Heart Rate (Admit) 70 bpm    Heart Rate (Exercise) 105 bpm    Heart Rate (Exit) 91 bpm    Rating of Perceived Exertion (Exercise) 12    Symptoms none    Duration Continue with 30 min of aerobic exercise without signs/symptoms of physical distress.    Intensity THRR unchanged      Progression   Progression Continue to progress workloads to maintain intensity without signs/symptoms of physical distress.    Average METs 2.1      Resistance Training   Training Prescription Yes    Weight 3 lb    Reps 10-15      Interval Training   Interval Training No      Treadmill   MPH 21    Grade 0    Minutes 15    METs 2.61      REL-XR   Level 1    Minutes 15    METs 1.6      Home Exercise Plan   Plans to continue exercise at [employer] (comment)   [employer]   Frequency Add 2 additional days to program exercise sessions.    Initial Home Exercises Provided 10/14/19           Nutrition:  Target Goals: Understanding of nutrition guidelines, daily intake of sodium <158m, cholesterol <2050m calories 30% from fat and 7% or less from saturated fats, daily to have 5 or more servings of fruits and vegetables.  Education: All About Nutrition: -Group instruction provided by verbal, written material, interactive activities, discussions, models, and posters to present general guidelines for heart healthy nutrition including fat,  fiber, MyPlate, the role of sodium in heart healthy nutrition,  utilization of the nutrition label, and utilization of this knowledge for meal planning. Follow up email sent as well. Written material given at graduation.   Cardiac Rehab from 12/23/2019 in [facility] Cardiac and Pulmonary Rehab  Date 12/11/19  Educator [facility]  Instruction Review Code 1- Verbalizes Understanding      Biometrics:  Pre Biometrics - 08/19/19 1132      Pre Biometrics   Height 5' 8.7" (1.745 m)    Weight 159 lb 4.8 oz (72.3 kg)    BMI (Calculated) 23.73    Single Leg Stand 2.69 seconds            Nutrition Therapy Plan and Nutrition Goals:  Nutrition Therapy & Goals - 09/02/19 1134      Nutrition Therapy   Diet Heart healthy, low Na, T2DM    Protein (specify units) 55-60g    Fiber 30 grams    Whole Grain Foods 3 servings    Saturated Fats 12 max. grams    Fruits and Vegetables 5 servings/day    Sodium 1.5 grams      Personal Nutrition Goals   Nutrition Goal ST: have a sandwich at home instead of going out to eat? add vegetables to lunch and breakfast, eat snack closer to rehab time or have larger snack to maintain BG during exercise  LT: eat in a way that helps his heart and BG    Comments Last A1C was around 6.6. BG ~120-135. No insulin.Drinks water and sometimes diet coke. B: Ham and egg, grits and dry wheat toast out, when home have a sweet toast and peanut butter and an apple or banana. Weekend - egg white omelette with ham on it and hashbrowns and dry wheat L: Eats lunch out at [employer], [employer] - chicken sandwich, subway wheat bread with [place] and lots of spinach S: boost. D: wife cooks chicken, green beans, potatoes, salads. Uses salt at the table (light salt), wife does not cook with it. Eats red meat 1x/week. Uses canola oil. Discussed heart healthy and T2DM eating.      Intervention Plan   Intervention Prescribe, educate and counsel regarding individualized specific dietary modifications aiming towards targeted core components such as weight,  hypertension, lipid management, diabetes, heart failure and other comorbidities.;Nutrition handout(s) given to patient.    Expected Outcomes Short Term Goal: A plan has been developed with personal nutrition goals set during dietitian appointment.;Short Term Goal: Understand basic principles of dietary content, such as calories, fat, sodium, cholesterol and nutrients.;Long Term Goal: Adherence to prescribed nutrition plan.           Nutrition Assessments:  Nutrition Assessments - 12/16/19 1150      MEDFICTS Scores   Pre Score 56    Post Score 13    Score Difference -43          MEDIFICTS Score Key:  ?70 Need to make dietary changes   40-70 Heart Healthy Diet  ? 40 Therapeutic Level Cholesterol Diet   Picture Your Plate Scores:  <16 Unhealthy dietary pattern with much room for improvement.  41-50 Dietary pattern unlikely to meet recommendations for good health and room for improvement.  51-60 More healthful dietary pattern, with some room for improvement.   >60 Healthy dietary pattern, although there may be some specific behaviors that could be improved.    Nutrition Goals Re-Evaluation:  Nutrition Goals Re-Evaluation    [place] Name 09/30/19 1112 10/14/19  1119 10/16/19 1134 11/27/19 1146 12/11/19 1124     Goals   Nutrition Goal ST: have a sandwich at home instead of going out to eat? add vegetables to lunch and breakfast, eat snack closer to rehab time or have larger snack to maintain BG during exercise  LT: eat in a way that helps his heart and BG More at home, more vegetables Balanced meal, more vegetables ST: continue with increasing vegetable intake LT: at least 5 fruits and vegetables per day ST: continue with increasing vegetable intake LT: at least 5 fruits and vegetables per day   Comment Pt reports doing ok, but reports not making any changes aside from eating more at home and adding in more vegetables. Cotton is doing well with his diet.  He aims for a balanced diet.   He is trying to eat more fruits and vegetables and watch his red meat intake. He has cut back on his eating out to just 2-3x a week.  He is also trying to balance his protein and carbs.  He is also using Boost as a recovery drink. Cotton is doing well. He is eating a balanced diet. He has tried to eat more vegetables.  He is good with his fruit intake.  He tries to avoid red meat and eats mainly chicken for his protein. Cotton reports doing well with his diet and reports no changes since last review. Consider adding vegetables to lunch and breakfast to increase intake. Cotton is doing well with is diet.  He has tried to add in vegetables, but not enough according to him.  He really likes beans and limits his red meat intake to twice a week.  His other favorite is corn on the cob and spinanch.   Expected Outcome ST: have a sandwich at home instead of going out to eat? add vegetables to lunch and breakfast, eat snack closer to rehab time or have larger snack to maintain BG during exercise  LT: eat in a way that helps his heart and BG Short: Continue with changes Long: Continue to focus on heart healthy diet. Short: Continue to try to get more vegetables. Long: Continue to eat heart healthy. ST: continue with increasing vegetable intake LT: at least 5 fruits and vegetables per day Short: Continue with vegetable intake  Long: COntinue to focus on healtier eating          Nutrition Goals Discharge (Final Nutrition Goals Re-Evaluation):  Nutrition Goals Re-Evaluation - 12/11/19 1124      Goals   Nutrition Goal ST: continue with increasing vegetable intake LT: at least 5 fruits and vegetables per day    Comment Cotton is doing well with is diet.  He has tried to add in vegetables, but not enough according to him.  He really likes beans and limits his red meat intake to twice a week.  His other favorite is corn on the cob and spinanch.    Expected Outcome Short: Continue with vegetable intake  Long: COntinue  to focus on healtier eating           Psychosocial: Target Goals: Acknowledge presence or absence of significant depression and/or stress, maximize coping skills, provide positive support system. Participant is able to verbalize types and ability to use techniques and skills needed for reducing stress and depression.   Education: Stress, Anxiety, and Depression - Group verbal and visual presentation to define topics covered.  Reviews how body is impacted by stress, anxiety, and depression.  Also discusses healthy  ways to reduce stress and to treat/manage anxiety and depression.  Written material given at graduation.   Cardiac Rehab from 12/23/2019 in [facility] Cardiac and Pulmonary Rehab  Date 11/06/19  Educator [facility]  Instruction Review Code 1- Verbalizes Understanding      Education: Sleep Hygiene -Provides group verbal and written instruction about how sleep can affect your health.  Define sleep hygiene, discuss sleep cycles and impact of sleep habits. Review good sleep hygiene tips.    Initial Review & Psychosocial Screening:  Initial Psych Review & Screening - 08/12/19 0838      Initial Review   Current issues with None Identified      Family Dynamics   Good Support System? Yes    Comments He can look to his wife, son and daughter for support. He states he is an overall positive person.      Barriers   Psychosocial barriers to participate in program The patient should benefit from training in stress management and relaxation.      Screening Interventions   Interventions Encouraged to exercise;Provide feedback about the scores to participant;Program counselor consult;To provide support and resources with identified psychosocial needs    Expected Outcomes Short Term goal: Utilizing psychosocial counselor, staff and physician to assist with identification of specific Stressors or current issues interfering with healing process. Setting desired goal for each stressor or current issue  identified.;Long Term Goal: Stressors or current issues are controlled or eliminated.;Short Term goal: Identification and review with participant of any Quality of Life or Depression concerns found by scoring the questionnaire.;Long Term goal: The participant improves quality of Life and PHQ9 Scores as seen by post scores and/or verbalization of changes           Quality of Life Scores:   Quality of Life - 12/16/19 1146      Quality of Life Scores   Health/Function Pre 29.46 %    Health/Function Post 24.82 %    Health/Function % Change -15.75 %    Socioeconomic Pre 26.25 %    Socioeconomic Post 22.92 %    Socioeconomic % Change  -12.69 %    Psych/Spiritual Pre 29.14 %    Psych/Spiritual Post 26.14 %    Psych/Spiritual % Change -10.3 %    Family Pre 28.8 %    Family Post 28.8 %    Family % Change 0 %    GLOBAL Pre 28.69 %    GLOBAL Post 25.43 %    GLOBAL % Change -11.36 %          Scores of 19 and below usually indicate a poorer quality of life in these areas.  A difference of  2-3 points is a clinically meaningful difference.  A difference of 2-3 points in the total score of the Quality of Life Index has been associated with significant improvement in overall quality of life, self-image, physical symptoms, and general health in studies assessing change in quality of life.  PHQ-9: Recent Review Flowsheet Data    Depression screen [facility] 2/9 12/16/2019 08/19/2019   Decreased Interest 0 0   Down, Depressed, Hopeless 0 0   PHQ - 2 Score 0 0   Altered sleeping 0 0   Tired, decreased energy 0 0   Change in appetite 0 0   Feeling bad or failure about yourself  0 0   Trouble concentrating 0 0   Moving slowly or fidgety/restless 0 0   Suicidal thoughts 0 0   PHQ-9 Score 0 0  Difficult doing work/chores Not difficult at all Not difficult at all     Interpretation of Total Score  Total Score Depression Severity:  1-4 = Minimal depression, 5-9 = Mild depression, 10-14 = Moderate  depression, 15-19 = Moderately severe depression, 20-27 = Severe depression   Psychosocial Evaluation and Intervention:  Psychosocial Evaluation - 08/12/19 0839      Psychosocial Evaluation & Interventions   Interventions Encouraged to exercise with the program and follow exercise prescription    Comments He can look to his wife, son and daughter for support. He states he is an overall positive person.    Expected Outcomes Short: Exercise regularly to support mental health and notify staff of any changes. Long: maintain mental health and well being through teaching of rehab or prescribed medications independently.    Continue Psychosocial Services  Follow up required by staff           Psychosocial Re-Evaluation:  Psychosocial Re-Evaluation    [place] Name 09/30/19 1126 10/14/19 1116 11/27/19 1124 12/11/19 1120       Psychosocial Re-Evaluation   Current issues with Current Sleep Concerns;Current Stress Concerns None Identified -- Current Stress Concerns    Comments Pt denies any depression or anxiety symptoms.  He sleeps well and can rely on family for support Cotton is doing well mentally.  He denies any major stressors.  He sleeps well at night and exercises routinely. Cotton reports no change in stress or sleep. Cotton is nearing graduation.  He is already working out on his own.  He has enjoyed learing his limits for exercise and the education.  He has found that exercise is life saving for him.  He has a brother in law that he is watching wasting away.  He continues to deny any major stressors and is sleeping well.    Expected Outcomes Short: continue to exercise Long:  maintain positive outlook Short: Continue to exercise  Long: Continue to stay positive. Short: continue to exercise Long: maintain positive outlook Short: Continue to exericse indpendently Long; Continue to stay positive    Interventions -- Encouraged to attend Cardiac Rehabilitation for the exercise -- Encouraged to attend  Cardiac Rehabilitation for the exercise    Continue Psychosocial Services  -- Follow up required by staff -- Follow up required by staff      Initial Review   Source of Stress Concerns -- -- -- Family           Psychosocial Discharge (Final Psychosocial Re-Evaluation):  Psychosocial Re-Evaluation - 12/11/19 1120      Psychosocial Re-Evaluation   Current issues with Current Stress Concerns    Comments Cotton is nearing graduation.  He is already working out on his own.  He has enjoyed learing his limits for exercise and the education.  He has found that exercise is life saving for him.  He has a brother in law that he is watching wasting away.  He continues to deny any major stressors and is sleeping well.    Expected Outcomes Short: Continue to exericse indpendently Long; Continue to stay positive    Interventions Encouraged to attend Cardiac Rehabilitation for the exercise    Continue Psychosocial Services  Follow up required by staff      Initial Review   Source of Stress Concerns Family           Vocational Rehabilitation: Provide vocational rehab assistance to qualifying candidates.   Vocational Rehab Evaluation & Intervention:   Education: Education Goals: Education classes  will be provided on a variety of topics geared toward better understanding of heart health and risk factor modification. Participant will state understanding/return demonstration of topics presented as noted by education test scores.  Learning Barriers/Preferences:  Learning Barriers/Preferences - 08/12/19 0837      Learning Barriers/Preferences   Learning Barriers None    Learning Preferences None           General Cardiac Education Topics:  AED/CPR: - Group verbal and written instruction with the use of models to demonstrate the basic use of the AED with the basic ABC's of resuscitation.   Anatomy and Cardiac Procedures: - Group verbal and visual presentation and models provide  information about basic cardiac anatomy and function. Reviews the testing methods done to diagnose heart disease and the outcomes of the test results. Describes the treatment choices: Medical Management, Angioplasty, or Coronary Bypass Surgery for treating various heart conditions including Myocardial Infarction, Angina, Valve Disease, and Cardiac Arrhythmias.  Written material given at graduation.   Cardiac Rehab from 12/23/2019 in [facility] Cardiac and Pulmonary Rehab  Date 11/27/19  Educator SB  Instruction Review Code 1- Verbalizes Understanding  [need identified]      Medication Safety: - Group verbal and visual instruction to review commonly prescribed medications for heart and lung disease. Reviews the medication, class of the drug, and side effects. Includes the steps to properly store meds and maintain the prescription regimen.  Written material given at graduation.   Cardiac Rehab from 12/23/2019 in [facility] Cardiac and Pulmonary Rehab  Date 12/18/19  Educator [facility]  Instruction Review Code 1- Verbalizes Understanding      Intimacy: - Group verbal instruction through game format to discuss how heart and lung disease can affect sexual intimacy. Written material given at graduation..   Cardiac Rehab from 12/23/2019 in [facility] Cardiac and Pulmonary Rehab  Date 11/20/19  Educator [facility]  Instruction Review Code 1- Verbalizes Understanding      Know Your Numbers and Heart Failure: - Group verbal and visual instruction to discuss disease risk factors for cardiac and pulmonary disease and treatment options.  Reviews associated critical values for Overweight/Obesity, Hypertension, Cholesterol, and Diabetes.  Discusses basics of heart failure: signs/symptoms and treatments.  Introduces Heart Failure Zone chart for action plan for heart failure.  Written material given at graduation.   Cardiac Rehab from 12/23/2019 in [facility] Cardiac and Pulmonary Rehab  Date 12/23/19  Educator [facility]  Instruction Review Code  1- Verbalizes Understanding      Infection Prevention: - Provides verbal and written material to individual with discussion of infection control including proper hand washing and proper equipment cleaning during exercise session.   Cardiac Rehab from 12/23/2019 in [facility] Cardiac and Pulmonary Rehab  Date 08/12/19  Educator [facility]  Instruction Review Code 1- Verbalizes Understanding      Falls Prevention: - Provides verbal and written material to individual with discussion of falls prevention and safety.   Cardiac Rehab from 12/23/2019 in [facility] Cardiac and Pulmonary Rehab  Date 08/12/19  Educator [facility]  Instruction Review Code 1- Verbalizes Understanding      Other: -Provides group and verbal instruction on various topics (see comments)   Knowledge Questionnaire Score:  Knowledge Questionnaire Score - 12/16/19 1151      Knowledge Questionnaire Score   Pre Score 21/26 Education Focus: Nutrition, Exercise, Angina    Post Score 24/26   Exercise           Core Components/Risk Factors/Patient Goals at Admission:  Personal Goals  and Risk Factors at Admission - 08/12/19 0837      Core Components/Risk Factors/Patient Goals on Admission    Weight Management Yes;Weight Gain    Intervention Weight Management: Develop a combined nutrition and exercise program designed to reach desired caloric intake, while maintaining appropriate intake of nutrient and fiber, sodium and fats, and appropriate energy expenditure required for the weight goal.;Weight Management: Provide education and appropriate resources to help participant work on and attain dietary goals.;Weight Management/Obesity: Establish reasonable short term and long term weight goals.    Expected Outcomes Short Term: Continue to assess and modify interventions until short term weight is achieved;Long Term: Adherence to nutrition and physical activity/exercise program aimed toward attainment of established weight goal;Weight Maintenance:  Understanding of the daily nutrition guidelines, which includes 25-35% calories from fat, 7% or less cal from saturated fats, less than 230m cholesterol, less than 1.5gm of sodium, & 5 or more servings of fruits and vegetables daily;Understanding recommendations for meals to include 15-35% energy as protein, 25-35% energy from fat, 35-60% energy from carbohydrates, less than 202mof dietary cholesterol, 20-35 gm of total fiber daily;Understanding of distribution of calorie intake throughout the day with the consumption of 4-5 meals/snacks;Weight Gain: Understanding of general recommendations for a high calorie, high protein meal plan that promotes weight gain by distributing calorie intake throughout the day with the consumption for 4-5 meals, snacks, and/or supplements    Diabetes Yes    Intervention Provide education about proper nutrition, including hydration, and aerobic/resistive exercise prescription along with prescribed medications to achieve blood glucose in normal ranges: Fasting glucose 65-99 mg/dL;Provide education about signs/symptoms and action to take for hypo/hyperglycemia.    Expected Outcomes Short Term: Participant verbalizes understanding of the signs/symptoms and immediate care of hyper/hypoglycemia, proper foot care and importance of medication, aerobic/resistive exercise and nutrition plan for blood glucose control.;Long Term: Attainment of HbA1C < 7%.    Hypertension Yes    Intervention Provide education on lifestyle modifcations including regular physical activity/exercise, weight management, moderate sodium restriction and increased consumption of fresh fruit, vegetables, and low fat dairy, alcohol moderation, and smoking cessation.;Monitor prescription use compliance.    Expected Outcomes Short Term: Continued assessment and intervention until BP is < 140/9070mG in hypertensive participants. < 130/40m99m in hypertensive participants with diabetes, heart failure or chronic kidney  disease.;Long Term: Maintenance of blood pressure at goal levels.    Lipids Yes    Intervention Provide education and support for participant on nutrition & aerobic/resistive exercise along with prescribed medications to achieve LDL <70mg57mL >40mg.43mExpected Outcomes Short Term: Participant states understanding of desired cholesterol values and is compliant with medications prescribed. Participant is following exercise prescription and nutrition guidelines.;Long Term: Cholesterol controlled with medications as prescribed, with individualized exercise RX and with personalized nutrition plan. Value goals: LDL < 70mg, 68m> 40 mg.           Education:Diabetes - Individual verbal and written instruction to review signs/symptoms of diabetes, desired ranges of glucose level fasting, after meals and with exercise. Acknowledge that pre and post exercise glucose checks will be done for 3 sessions at entry of program.   [facility]  Date 08/12/19  [facility] Code 1- Verbalizes Understanding      Core Components/Risk Factors/Patient Goals Review:   Goals and Risk Factor Review    [place] Name 09/30/19 1124 10/14/19 1117 11/27/19 1122 12/11/19 1126  Core Components/Risk Factors/Patient Goals Review   Personal Goals Review Weight Management/Obesity;Hypertension;Lipids;Diabetes Weight Management/Obesity;Hypertension;Lipids;Diabetes Weight Management/Obesity;Hypertension;Lipids;Diabetes Weight Management/Obesity;Hypertension;Lipids;Diabetes    Review Cotton reports taking meds as directed.  He checks BG once a day in the morning and reports it has been good.  He doesnt monitor BP at home - numbers have been good at [facility].  He is feeling good with exercise. Cotton is doing well in rehab. His weight is mostly steady. It is a little lower than when he started.  His blood pressures have been good in class and he does not check it at  home as he has not noticed any bad one.  We talked about why recording it and tracking it at home can be helpful.  He is doing well with his blood sugars and he continues to check those at home. Cotton has started checking BP at home once a day.  he also checks BG at home.  Weight is 162 today. Cotton has been doing well in rehab.  His weight is holding steady now that he is exercising routinely again around 163 lb.  He continues to check his sugars and last A1c was 6.3 and his numbers have been around the 130s in the morning.  His pressures have been doing good in class and usually around the 110s at home.  His cholesterol has been around 130 total and LDL was around 70.  He was pleased with how things are lookin g    Expected Outcomes Short: continue to check fasting BG Long : manage risk factors Short: Start tracking BP at home Long; Conitnue to manage risk factors. Short: continue to monitor vitals at home Long: manage risk factors long term Continue to manage risk factors.           Core Components/Risk Factors/Patient Goals at Discharge (Final Review):   Goals and Risk Factor Review - 12/11/19 1126      Core Components/Risk Factors/Patient Goals Review   Personal Goals Review Weight Management/Obesity;Hypertension;Lipids;Diabetes    Review Cotton has been doing well in rehab.  His weight is holding steady now that he is exercising routinely again around 163 lb.  He continues to check his sugars and last A1c was 6.3 and his numbers have been around the 130s in the morning.  His pressures have been doing good in class and usually around the 110s at home.  His cholesterol has been around 130 total and LDL was around 70.  He was pleased with how things are lookin g    Expected Outcomes Continue to manage risk factors.           ITP Comments:  ITP Comments    Row Name 08/12/19 0841 08/19/19 1127 08/26/19 1140 09/10/19 0758 10/08/19 1659   ITP Comments Virtual Visit completed. Patient informed  on EP and RD appointment and 6 Minute walk test. Patient also informed of patient health questionnaires on My Chart. Patient Verbalizes understanding. Visit diagnosis can be found in [facility] 07/29/2019. Completed 6MWT and gym orientation. Initial ITP created and sent for review to Dr. Emily Filbert, Medical Director. First full day of exercise!  Patient was oriented to gym and equipment including functions, settings, policies, and procedures.  Patient's individual exercise prescription and treatment plan were reviewed.  All starting workloads were established based on the results of the 6 minute walk test done at initial orientation visit.  The plan for exercise progression was also introduced and progression will be customized based on patient's performance and goals. [place]  Day review completed. Medical Director ITP review done, changes made as directed, and signed approval by Medical Director. 30 day review completed. ITP sent to Dr. Emily Filbert, Medical Director of Cardiac and Pulmonary Rehab. Continue with ITP unless changes are made by physician.   [place] Name 11/05/19 0720 12/03/19 0734 12/23/19 1402       ITP Comments 30 Day review completed. Medical Director ITP review done, changes made as directed, and signed approval by Medical Director. 30 Day review completed. Medical Director ITP review done, changes made as directed, and signed approval by Medical Director. Sporadic attendance Last visit 11/10/2019 Britian graduated today from  rehab with 36 sessions completed.  Details of the patient's exercise prescription and what He needs to do in order to continue the prescription and progress were discussed with patient.  Patient was given a copy of prescription and goals.  Patient verbalized understanding.  Darey plans to continue to exercise by continuing his days at the local gym.            Comments: Discharge ITP

## 2020-01-13 ENCOUNTER — Encounter: Payer: Self-pay | Admitting: Cardiology

## 2020-01-13 ENCOUNTER — Ambulatory Visit (INDEPENDENT_AMBULATORY_CARE_PROVIDER_SITE_OTHER): Payer: Medicare Other | Admitting: Cardiology

## 2020-01-13 ENCOUNTER — Other Ambulatory Visit: Payer: Self-pay

## 2020-01-13 VITALS — BP 124/72 | HR 60 | Ht 67.0 in | Wt 165.0 lb

## 2020-01-13 DIAGNOSIS — I350 Nonrheumatic aortic (valve) stenosis: Secondary | ICD-10-CM

## 2020-01-13 DIAGNOSIS — Z952 Presence of prosthetic heart valve: Secondary | ICD-10-CM

## 2020-01-13 DIAGNOSIS — I208 Other forms of angina pectoris: Secondary | ICD-10-CM

## 2020-01-13 DIAGNOSIS — I10 Essential (primary) hypertension: Secondary | ICD-10-CM | POA: Diagnosis not present

## 2020-01-13 DIAGNOSIS — E785 Hyperlipidemia, unspecified: Secondary | ICD-10-CM

## 2020-01-13 DIAGNOSIS — I2510 Atherosclerotic heart disease of native coronary artery without angina pectoris: Secondary | ICD-10-CM

## 2020-01-13 DIAGNOSIS — Z9861 Coronary angioplasty status: Secondary | ICD-10-CM | POA: Diagnosis not present

## 2020-01-13 DIAGNOSIS — R5383 Other fatigue: Secondary | ICD-10-CM

## 2020-01-13 MED ORDER — METOPROLOL SUCCINATE ER 25 MG PO TB24: 12.5000 mg | ORAL_TABLET | Freq: Every day | ORAL | 3 refills | Status: DC

## 2020-01-13 MED ORDER — ROSUVASTATIN CALCIUM 20 MG PO TABS
20.0000 mg | ORAL_TABLET | Freq: Every day | ORAL | 3 refills | Status: DC
Start: 2020-01-13 — End: 2021-03-28

## 2020-01-13 NOTE — Progress Notes (Signed)
Primary Care Provider: Sofie Hartigan, MD Cardiologist: Victor Hew, MD Electrophysiologist: None  Clinic Note: Chief Complaint  Patient presents with  . Follow-up    1st visit with Primary Cardiologist after TAVR  . Fatigue    Not happy that Energy level is still low post TAVR  . Coronary Artery Disease    No angina   Problem List Items Addressed This Visit    Essential hypertension (Chronic)   Hyperlipidemia with target LDL less than 70 (Chronic)   CAD S/P percutaneous coronary angioplasty --> PCI RCA Promus DES 2.5 mm x 23 mm - Primary (Chronic)   S/P TAVR (transcatheter aortic valve replacement) (Chronic)   Fatigue    HPI:    Victor [name] is a 86 y.o. male with a PMH notable for CAD-PCI, Critical AS - s/p TAVR,  HTN/HLD, DM-2, etc. who presents today for 4 month f/u .     He has known CAD s/p PCI to RCA in 2008. Echo at that time showed EF >55% with moderate calcification of the aortic valve leaflets.   PreTAVR CATH 05/2019: patent RCA stent.  Stable ~55% pRCA & ~70% small D2.   Progressive Aortic Stenosis:  Echo in 2012: Mild-Mod AS.   Progressed to Severe AS 03/2019:  Severely calcified aortic valve with Severe AS (mean gradient 42 mmHg); -< initial plan was to monitor  He called in Early May 2021 noting increased fatigue with notably reduced Energy level - not able to go to the gym.  Echo 5/13 - progression of mean gradient to 52 mmhg --> referred for TAVR;  TAVR 07/29/2019: R TFA Approach, Edwards Sapient 29 TAVR Valve  Victor [name] was last seen on 09/11/2019 by Victor Form, PA - 2nd Post TAVR. Initial post-op visist in July - noted fatigue & sleeping all the time. - BMP, CBC & TSH checked. @ 2nd f/u visit - no CP or CHF Sx, no palpitations --still noted persistent fatigue -> did not get the expected "boost" from TAVR.  -> Plan Plavix x 6 months post TAVR (Jan 2020).   Recent Hospitalizations: none  Reviewed  CV studies:    The following studies  were reviewed today: (if available, images/films reviewed: From Epic Chart or Care Everywhere) . Echo 03/06/2019: Severely calcified aortic valve-severe stenosis.  AVA 0.79 cm.  Mean gradient 42.3 mmHg.  EF 60 to 65%.  GR 1 DD.  No R WMA.  Severe LA dilation. . Echo 06/12/2019: Severe/critical AS -V-max 4.78ms, mean gradient 52-53 mmHg, AVA 0.49 cm. EF 60-65%. No RWMA. Gr 1 DD?.Marland KitchenMod-Severe LA dilation.  . R&LHC 06/25/2019: Severe AS (mean gradent 44.2 mmHg). Stable CAD - 50-60% pRCA with widely patent stent.  Small caliber D1 w/ ost 70%. Normal RHC Pressures.    . Carotid Dopplers 07/16/19: < 40% bilateral ICA. Normal Vertebral & SubClav Arteries.  .Marland KitchenTAVR 07/29/2019-  R TFA Approach -> Edwards Sapien 3 THV Size 29 mm) o TTE POD# 1: EF 60 to 65%.  Moderate LA dilation.  Mild to moderate MR.  Appropriately placed normal functioning TAVR valve.  Mean gradient 5 mmHg.  TTE 09/11/2019: EF 60 to 65%.  Normal RV function.  GR 1 DD.  No R WMA.  Moderate LA dilation.  Mild to moderate MR.  Mild to moderate TR.  Stable Edwards Sapien 29 mmTAVR valve in aortic position -normal function.  Mean gradient 8 mmHg.  Interval History:   Victor [name] here today for his first  follow-up with me stating that he is not really having any cardiac symptoms of chest discomfort or dyspnea with rest or exertion.  No heart failure symptoms of PND, orthopnea or edema.  No palpitations or irregular heartbeats.  No syncope or near syncope.  NYHA I symptoms from the standpoint, however he still has persistent fatigue.  He is doing the cardiac rehab, but when he gets home from that he seems exhausted.  Patient to be very disappointed because he did not get the expected benefit from TAVR.  Sleeping a lot, no energy to do things.  Not going to the gym etc.  CV Review of Symptoms (Summary): no chest pain or dyspnea on exertion positive for - Fatigue-lack of desire to do anything.  Sleeping all the time. negative for - edema,  irregular heartbeat, orthopnea, palpitations, paroxysmal nocturnal dyspnea, rapid heart rate, shortness of breath or Syncope/near syncope or TIA/amaurosis fugax, claudication  The patient does not have symptoms concerning for COVID-19 infection (fever, chills, cough, or new shortness of breath).   REVIEWED OF SYSTEMS   Review of Systems  Constitutional: Positive for malaise/fatigue. Negative for weight loss (Weight has actually gone up and down.).  HENT: Negative for congestion and nosebleeds.   Respiratory: Negative for cough, shortness of breath and wheezing.   Gastrointestinal: Negative for blood in stool and melena.  Genitourinary: Positive for frequency (Nocturia). Negative for hematuria.  Musculoskeletal: Negative for falls and joint pain.  Neurological: Negative for dizziness, weakness and headaches.  Psychiatric/Behavioral: Negative for depression (Just upset that he is not back to his previous level of energy) and memory loss. The patient has insomnia. The patient is not nervous/anxious.    I have reviewed and (if needed) personally updated the patient's problem list, medications, allergies, past medical and surgical history, social and family history.   PAST MEDICAL HISTORY   Past Medical History:  Diagnosis Date  . Angina   . Arthritis   . BPH (benign prostatic hypertrophy)   . CAD S/P percutaneous coronary angioplasty 07/2006   PCI to RCA - Promus DES 2.5 mm x 23 mm; 2D ECHO - EF >55% --> Cath 05/2019 - ~50-60% pRCA with patent stent.  70% ost D1 (small).    . Diabetes mellitus   . Diverticulitis    s/p colectomy  . GERD (gastroesophageal reflux disease)   . H/O Severe aortic stenosis 05/2019   Pre-TAVR mean AVA 52.5 mmHg.  AVA by VTI 0.19 cm  . High cholesterol   . History of kidney stones    per patient, "a very long time ago and it passed by itself"  . Hypertension    "from the diabetes"  . 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   s/p TAVR  with a 29 mm Edwards Sapien 3 via the TF approach with Drs Victor [name] & Victor [name]  - 08/2019 Echo shows normla function, no aI with mena AV gradient 8 mmHg.  Marland Kitchen Squamous cell skin cancer, nasal tip     PAST SURGICAL HISTORY   Past Surgical History:  Procedure Laterality Date  . CARDIAC CATHETERIZATION with PCI  08/27/2006   RCA-mid - 2.5x41m Promus stent  . CATARACT EXTRACTION W/ INTRAOCULAR LENS  IMPLANT, BILATERAL    . CATARACT EXTRACTION W/PHACO Right 03/08/2015   Procedure: CATARACT EXTRACTION PHACO AND INTRAOCULAR LENS PLACEMENT (IOC);  Surgeon: SEstill Cotta MD;  Location: [facility] ORS;  Service: Ophthalmology;  Laterality: Right;  [place] 01:29.4   . COLECTOMY  ~ 2000  . LEFT HEART  CATHETERIZATION WITH CORONARY ANGIOGRAM N/A 03/22/2011   Procedure: LEFT HEART CATHETERIZATION WITH CORONARY ANGIOGRAM;  Surgeon: Leonie Man, MD;  Location: [facility] CATH LAB;  Service: Cardiovascular::: Patent RCA stent w/ progression of pRCA Dz to ~50-60%. Progression of oD3 lesion to 60-70% - not optimal for PCI (b/c ostial).  EF 55-60% - no RWMA. Med Rx.  Aortic valve gradient: Peak 22 mmHg, mean 13 mmHg  . NM MYOVIEW LTD  08/2014   LOW RISK. NORMAL.  EF 45-54%.   Marland Kitchen RIGHT/LEFT HEART CATH AND CORONARY ANGIOGRAPHY N/A 06/25/2019   Procedure: RIGHT/LEFT HEART CATH AND CORONARY ANGIOGRAPHY;  Surgeon: Leonie Man, MD;  Location: [facility] CV LAB;  Severe AS (mean gradent 44.2 mmHg). Stable CAD - 50-60% pRCA with widely patent stent.  Small caliber D2 w/ ost 70%. Normal RHC Pressures.  . TEE WITHOUT CARDIOVERSION N/A 07/29/2019   Procedure: TRANSESOPHAGEAL ECHOCARDIOGRAM (TEE);  Surgeon: Burnell Blanks, MD;  Location: [place] CV LAB;  Service: Open Heart Surgery;  Laterality: N/A;  . TRANSCATHETER AORTIC VALVE REPLACEMENT, TRANSFEMORAL N/A 07/29/2019   Procedure: TRANSCATHETER AORTIC VALVE REPLACEMENT, TRANSFEMORAL;  Surgeon: Burnell Blanks, MD;  Location: [place] [facility] INVASIVE CV LAB;; R TFA Approach ->  Edwards Sapien 3 THV (size 29 mm, model # [id number], serial # [id number])  . TRANSTHORACIC ECHOCARDIOGRAM  06/2016;04/2017   a) EF 60-65%. GR 1 DD. Mod-Severe AS (mean gradient 30 mmHg, peak gradient of 57 mmHg).  b)Normal LV size and function.  EF 60-65%.  GR 1 DD.  Moderate aortic stenosis with estimated valve area between 0.88-0.97 cm.  (Mean gradient 25 mmHg, peak gradient 43 mmHg)  . TRANSTHORACIC ECHOCARDIOGRAM  03/2019; 05/2019   a)  Severely calcified aortic valve-severe stenosis.  AVA 0.79 cm.  Mean gradient 42.3 mmHg.  EF 60 to 65%.;; b) Severe/critical AS -V-max 4.60ms, mean gradient 52-53 mmHg, AVA 0.49 cm. EF 60-65%. No RWMA. Gr 1 DD?.Marland KitchenMod-Severe LA dilation.   . TRANSTHORACIC ECHOCARDIOGRAM  07/2019; 08/2019   a) POD #1 TAVR: EF 60 to 65%.  Moderate LA dilation.  Mild to moderate MR.  Well seated, nl fxning  TAVR valve.  Mean gradient 5 mmHg.;; b) EF 60 to 65%.  Normal RV function.  GR 1 DD.  No R WMA.  Moderate LA dilation.  Mild to moderate MR.  Mild to moderate TR.  Stable Edwards Sapien 29 mmTAVR valve in aortic position -normal function.  Mean gradient 8 mmHg.  .Marland KitchenTRANSURETHRAL RESECTION OF PROSTATE  ~ 2010    Immunization History  Administered Date(s) Administered  . Tdap 09/06/2016    MEDICATIONS/ALLERGIES   Current Meds  Medication Sig  . acetaminophen (TYLENOL) 500 MG tablet Take 500 mg by mouth every 6 (six) hours as needed for moderate pain or headache.  . Artificial Tear Solution (GENTEAL TEARS OP) Place 1 drop into both eyes daily.  .Marland Kitchenascorbic acid (VITAMIN C) 500 MG tablet Take 1,000-1,500 mg by mouth See admin instructions. Take 1000 mg in the morning and 1500 mg at night  . aspirin EC 81 MG tablet Take 81 mg by mouth daily.  . benzonatate (TESSALON) 200 MG capsule Take 200 mg by mouth 3 (three) times daily as needed for cough.  . bismuth subsalicylate (PEPTO BISMOL) 262 MG/15ML suspension Take 30 mLs by mouth every 6 (six) hours as needed for indigestion or  diarrhea or loose stools.  . calcium carbonate (OS-CAL) 600 MG TABS tablet Take 600 mg by mouth daily.  . cetirizine (ZYRTEC)  10 MG tablet Take 10 mg by mouth daily.  . Cholecalciferol (VITAMIN D3) 2000 UNITS TABS Take 4,000 Units by mouth 2 (two) times daily.   Marland Kitchen CINNAMON PO Take 1,000 mg by mouth 2 (two) times daily.  . clindamycin (CLEOCIN) 300 MG capsule Take 2 capsules (600 mg) 1 hour prior to all dental visits.  . clopidogrel (PLAVIX) 75 MG tablet Take 1 tablet (75 mg total) by mouth daily with breakfast.  . Coenzyme Q10 (COQ-10) 100 MG CAPS Take 100 mg by mouth daily.  . Cyanocobalamin (B-12) 2500 MCG TABS Take 2,500 mcg by mouth daily.  Marland Kitchen donepezil (ARICEPT) 10 MG tablet Take 10 mg by mouth daily.  Marland Kitchen EPIPEN 2-PAK 0.3 MG/0.3ML SOAJ injection Inject 0.3 mLs as directed as needed for anaphylaxis.   Marland Kitchen ezetimibe (ZETIA) 10 MG tablet Take 10 mg by mouth daily.  . fluticasone (FLONASE) 50 MCG/ACT nasal spray Place 2 sprays into both nostrils 2 (two) times daily as needed for allergies.   Marland Kitchen gabapentin (NEURONTIN) 100 MG capsule Take 300 mg by mouth at bedtime.   Marland Kitchen glipiZIDE (GLUCOTROL XL) 2.5 MG 24 hr tablet Take 2.5 mg by mouth daily with breakfast.  . Glucosamine HCl 1000 MG TABS Take 1,000 mg by mouth 2 (two) times daily.  Marland Kitchen ipratropium (ATROVENT) 0.03 % nasal spray Place 2 sprays into both nostrils 2 (two) times daily as needed for rhinitis.  Marland Kitchen loperamide (IMODIUM A-D) 2 MG tablet Take 2 mg by mouth daily as needed for diarrhea or loose stools.  . methocarbamol (ROBAXIN) 500 MG tablet Take 500 mg by mouth at bedtime as needed for muscle spasms.  . Multiple Vitamin (MULITIVITAMIN WITH MINERALS) TABS Take 1 tablet by mouth 2 (two) times daily.   . Omega 3 1200 MG CAPS Take 2,400 mg by mouth 2 (two) times daily.   . Probiotic CAPS Take 1 capsule by mouth daily.  . pseudoephedrine (SUDAFED) 30 MG tablet Take 60 mg by mouth every 4 (four) hours as needed for congestion.  . Saw Palmetto 450 MG  CAPS Take 900 mg by mouth daily.   Marland Kitchen triamcinolone cream (KENALOG) 0.1 % Apply 1 application topically 3 (three) times daily as needed for itching.  . zinc gluconate 50 MG tablet Take 50 mg by mouth daily.  . [DISCONTINUED] metoprolol succinate (TOPROL-XL) 25 MG 24 hr tablet Take 1 tablet by mouth once daily  . [DISCONTINUED] simvastatin (ZOCOR) 40 MG tablet Take 40 mg by mouth at bedtime.    Allergies  Allergen Reactions  . Azithromycin Rash and Anaphylaxis  . Cephalosporins Anaphylaxis and Other (See Comments)  . Codeine Anaphylaxis, Itching and Rash  . Doxycycline Anaphylaxis  . Phenylephrine-Guaifenesin     Unknown reaction   . Pseudoephedrine Other (See Comments)    unknown   . Amoxicillin Rash  . Levofloxacin Rash  . Penicillins Rash    Other reaction(s): Unknown  . Sulfa Antibiotics Rash  . Sulfasalazine Rash    SOCIAL HISTORY/FAMILY HISTORY   Reviewed in Epic:  Pertinent findings: None  OBJCTIVE -PE, EKG, labs   Wt Readings from Last 3 Encounters:  01/13/20 165 lb (74.8 kg)  09/11/19 170 lb (77.1 kg)  08/19/19 159 lb 4.8 oz (72.3 kg)    Physical Exam: BP 124/72   Pulse 60   Ht '5\' 7"'  (1.702 m)   Wt 165 lb (74.8 kg)   SpO2 93%   BMI 25.84 kg/m  Physical Exam Vitals reviewed.  Constitutional:  Appearance: Normal appearance. He is obese.     Comments: Although he seems tired, he is relatively healthy appearing.  Looks younger than stated age.  HENT:     Head: Normocephalic and atraumatic.  Neck:     Vascular: No carotid bruit (Soft radiated murmur, not bruit), hepatojugular reflux or JVD.  Cardiovascular:     Rate and Rhythm: Regular rhythm.  No extrasystoles are present.    Chest Wall: PMI is not displaced.     Heart sounds: Murmur (1/6 SEM at RUSB) heard.  No friction rub. No gallop.   Pulmonary:     Effort: Pulmonary effort is normal. No respiratory distress.     Breath sounds: Normal breath sounds.  Chest:     Chest wall: No tenderness.   Musculoskeletal:        General: No swelling. Normal range of motion.     Cervical back: Normal range of motion and neck supple.  Neurological:     General: No focal deficit present.     Mental Status: He is alert and oriented to person, place, and time. Mental status is at baseline.     Motor: No weakness.     Gait: Gait normal.  Psychiatric:        Behavior: Behavior normal.        Thought Content: Thought content normal.        Judgment: Judgment normal.     Comments: Seems to build with down, disheartened     Adult ECG Report  Rate: 60 ;  Rhythm: normal sinus rhythm, sinus arrhythmia and Low Voltage; CRO Sept MI, age indeterminate;   Narrative Interpretation: stable  Recent Labs:  Reviewed  Component 10/09/19 03/25/19 09/03/18 03/06/18  Cholesterol, Total 137 149 125 150  Triglyceride 141 137 117 126  HDL (High Density Lipoprotein) Cholesterol 46.2 54.9 49.5 54.2  LDL Calculated 63 67 52 71  VLDL Cholesterol '28 27 23 25  ' Cholesterol/HDL Ratio 3.0 2.7 2.5 2.8   Component 10/09/19 09/23/19 03/25/19 09/03/18 07/31/18 03/06/18  Glucose 118 197High 133High 134High 129High 141High  Sodium 141 139 140 140 141 141  Potassium 4.7 4.6 4.7 4.9 4.7 5.0  Chloride 104 103 101 102 103 102  Carbon Dioxide (CO2) 32.6 31.0 32.4High 31.6 32.4High 33.8High  Urea Nitrogen (BUN) '17 19 17 15 19 13  ' Creatinine 0.8 0.9 0.9 0.8 0.9 0.9  Glomerular Filtration Rate (eGFR), MDRD Estimate 92 80 80 92 80 80  Calcium 9.4 9.3 9.6 9.7 9.3 9.7  AST  '21 27 28 27 25 24  ' ALT  '21 25 27 26 24 21  ' Alk Phos (alkaline Phosphatase) 90 65 91 84 75 67  Albumin 4.3 4.3 4.4 4.5 4.1 4.5  Bilirubin, Total 0.7 0.4 0.7 0.7 0.6 0.8  Protein, Total 6.6 6.5 6.7 6.8      Lab Results  Component Value Date   CREATININE 0.88 08/07/2019   BUN 19 08/07/2019   NA 141 08/07/2019   K 4.7 08/07/2019   CL 103 08/07/2019   CO2 25 08/07/2019   Lab Results  Component Value Date   TSH 3.780  08/07/2019    ASSESSMENT/PLAN    Problem List Items Addressed This Visit    Severe calcific aortic valve stenosis (Chronic)    He had relatively significant progression over the last couple years, most notably from February to May of this year.  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  Minimal murmur.  Unfortunately, his major symptom of fatigue has  not really improved.      Relevant Medications   rosuvastatin (CRESTOR) 20 MG tablet   metoprolol succinate (TOPROL-XL) 25 MG 24 hr tablet   Essential hypertension (Chronic)    Blood pressure looks great.  He is on low-dose Toprol only.  Plan: With fatigue, will reduce Toprol to one half tab (reluctant to fully discontinue because of CAD and history of palpitations/PVCs.      Relevant Medications   rosuvastatin (CRESTOR) 20 MG tablet   metoprolol succinate (TOPROL-XL) 25 MG 24 hr tablet   Hyperlipidemia with target LDL less than 70 (Chronic)    He is currently on simvastatin.  (Had been on combination of simvastatin plus Zetia, but Zetia apparently was not restarted)  With him having significant fatigue, like to see if there is a potential complement of statin sensitivity.  His LDL was 63 on last check, so we did have him to work.  Plan: DC simvastatin -> after 2 weeks, restart statin with Crestor 20 mg daily      Relevant Medications   rosuvastatin (CRESTOR) 20 MG tablet   metoprolol succinate (TOPROL-XL) 25 MG 24 hr tablet   Other Relevant Orders   EKG 12-Lead (Completed)   CAD S/P percutaneous coronary angioplasty --> PCI RCA Promus DES 2.5 mm x 23 mm - Primary (Chronic)    Relatively distant PCI with patent stent by last cath. He is now back on Plavix because of TAVR.  Plan:   Continue Plavix through January 2022, then switch back to 81 mg aspirin.  Continue beta-blocker, ARB reduced to 1/2 tablet  Continue statin with brief interruption to allow for simvastatin washout before restarting with rosuvastatin/Crestor 20  mg       Relevant Medications   rosuvastatin (CRESTOR) 20 MG tablet   metoprolol succinate (TOPROL-XL) 25 MG 24 hr tablet   Other Relevant Orders   EKG 12-Lead (Completed)   S/P TAVR (transcatheter aortic valve replacement) (Chronic)    Most recent echo showed normal functioning valve.  From a cardiac standpoint NYHA class I symptoms.  He is currently in rehab, will try to see if we can get him into the maintenance course of cardiac rehab      Relevant Orders   EKG 12-Lead (Completed)   Fatigue due to treatment    He has always had fatigue, but seems to be worse now.  I wonder if what we felt was a symptom related to aortic stenosis was more just simply progression of his baseline fatigue.  Unfortunately he did not notice TAVR Stoioff improvement post TAVR. There may be a component of dysthymia or depression.  For now, will back off of medications that can potentially be related to fatigue:  Reduce Toprol to 1/2 tablet  Convert from simvastatin to rosuvastatin after 2-week holiday          COVID-19 Education: The signs and symptoms of COVID-19 were discussed with the patient and how to seek care for testing (follow up with PCP or arrange E-visit).   The importance of social distancing and COVID-19 vaccination was discussed today.The patient is practicing social distancing & Masking.   I spent a total of 23mnutes with the patient spent in direct patient consultation.  Additional time spent with chart review  / charting (studies, outside notes, etc): 15 Total Time: 44 min   Current medicines are reviewed at length with the patient today.  (+/- concerns) n/a  This visit occurred during the SARS-CoV-2 public health emergency.  Safety protocols  were in place, including screening questions prior to the visit, additional usage of staff PPE, and extensive cleaning of exam room while observing appropriate contact time as indicated for disinfecting solutions.  Notice: This  dictation was prepared with Dragon dictation along with smaller phrase technology. Any transcriptional errors that result from this process are unintentional and may not be corrected upon review.  Patient Instructions / Medication Changes & Studies & Tests Ordered   Patient Instructions  Medication Instructions:   Decrease Metoprolol Succinate 12.5 mg  ( 1/2 tablet ) daily  Stop taking Simvastatin for 2 weeks then start taking Rosuvastatin 20 mg take one tablet daily     *If you need a refill on your cardiac medications before your next appointment, please call your pharmacy*   Lab Work: Not needed   Testing/Procedures: Not needed   Follow-Up: At [facility], you and your health needs are our priority.  As part of our continuing mission to provide you with exceptional heart care, we have created designated Provider Care Teams.  These Care Teams include your primary Cardiologist (physician) and Advanced Practice Providers (APPs -  Physician Assistants and Nurse Practitioners) who all work together to provide you with the care you need, when you need it.  We recommend signing up for the patient portal called "MyChart".  Sign up information is provided on this After Visit Summary.  MyChart is used to connect with patients for Virtual Visits (Telemedicine).  Patients are able to view lab/test results, encounter notes, upcoming appointments, etc.  Non-urgent messages can be sent to your provider as well.   To learn more about what you can do with MyChart, go to [email or web address].    Your next appointment:   6 month(s)  The format for your next appointment:   In Person  Provider:   [name], MD       Studies Ordered:   Orders Placed This Encounter  Procedures  . EKG 12-Lead     Victor [name], M.D., M.S. Interventional Cardiologist   Pager # [phone] Phone # [phone] [address] [place], [place] [address]   Thank you for choosing  Heartcare at [facility]!!

## 2020-01-13 NOTE — Patient Instructions (Signed)
Medication Instructions:   Decrease Metoprolol Succinate 12.5 mg  ( 1/2 tablet ) daily  Stop taking Simvastatin for 2 weeks then start taking Rosuvastatin 20 mg take one tablet daily     *If you need a refill on your cardiac medications before your next appointment, please call your pharmacy*   Lab Work: Not needed   Testing/Procedures: Not needed   Follow-Up: At [facility], you and your health needs are our priority.  As part of our continuing mission to provide you with exceptional heart care, we have created designated Provider Care Teams.  These Care Teams include your primary Cardiologist (physician) and Advanced Practice Providers (APPs -  Physician Assistants and Nurse Practitioners) who all work together to provide you with the care you need, when you need it.  We recommend signing up for the patient portal called "MyChart".  Sign up information is provided on this After Visit Summary.  MyChart is used to connect with patients for Virtual Visits (Telemedicine).  Patients are able to view lab/test results, encounter notes, upcoming appointments, etc.  Non-urgent messages can be sent to your provider as well.   To learn more about what you can do with MyChart, go to [email or web address].    Your next appointment:   6 month(s)  The format for your next appointment:   In Person  Provider:   [name], MD

## 2020-01-29 ENCOUNTER — Encounter: Payer: Self-pay | Admitting: Cardiology

## 2020-01-29 NOTE — Assessment & Plan Note (Signed)
He has always had fatigue, but seems to be worse now.  I wonder if what we felt was a symptom related to aortic stenosis was more just simply progression of his baseline fatigue.  Unfortunately he did not notice TAVR Stoioff improvement post TAVR. There may be a component of dysthymia or depression.  For now, will back off of medications that can potentially be related to fatigue:  Reduce Toprol to 1/2 tablet  Convert from simvastatin to rosuvastatin after 2-week holiday

## 2020-01-29 NOTE — Assessment & Plan Note (Signed)
Blood pressure looks great.  He is on low-dose Toprol only.  Plan: With fatigue, will reduce Toprol to one half tab (reluctant to fully discontinue because of CAD and history of palpitations/PVCs.

## 2020-01-29 NOTE — Assessment & Plan Note (Signed)
Most recent echo showed normal functioning valve.  From a cardiac standpoint NYHA class I symptoms.  He is currently in rehab, will try to see if we can get him into the maintenance course of cardiac rehab

## 2020-01-29 NOTE — Assessment & Plan Note (Signed)
He is currently on simvastatin.  (Had been on combination of simvastatin plus Zetia, but Zetia apparently was not restarted)  With him having significant fatigue, like to see if there is a potential complement of statin sensitivity.  His LDL was 63 on last check, so we did have him to work.  Plan: DC simvastatin -> after 2 weeks, restart statin with Crestor 20 mg daily

## 2020-01-29 NOTE — Assessment & Plan Note (Signed)
He had relatively significant progression over the last couple years, most notably from February to May of this year.  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  Minimal murmur.  Unfortunately, his major symptom of fatigue has not really improved.

## 2020-01-29 NOTE — Assessment & Plan Note (Signed)
Relatively distant PCI with patent stent by last cath. He is now back on Plavix because of TAVR.  Plan:   Continue Plavix through January 2022, then switch back to 81 mg aspirin.  Continue beta-blocker, ARB reduced to 1/2 tablet  Continue statin with brief interruption to allow for simvastatin washout before restarting with rosuvastatin/Crestor 20 mg

## 2020-03-24 DIAGNOSIS — R4586 Emotional lability: Secondary | ICD-10-CM | POA: Insufficient documentation

## 2020-03-25 ENCOUNTER — Encounter: Payer: Self-pay | Admitting: Podiatry

## 2020-03-25 ENCOUNTER — Ambulatory Visit (INDEPENDENT_AMBULATORY_CARE_PROVIDER_SITE_OTHER): Payer: Medicare Other | Admitting: Podiatry

## 2020-03-25 ENCOUNTER — Other Ambulatory Visit: Payer: Self-pay

## 2020-03-25 DIAGNOSIS — M79676 Pain in unspecified toe(s): Secondary | ICD-10-CM | POA: Diagnosis not present

## 2020-03-25 DIAGNOSIS — B351 Tinea unguium: Secondary | ICD-10-CM | POA: Diagnosis not present

## 2020-03-25 DIAGNOSIS — E1159 Type 2 diabetes mellitus with other circulatory complications: Secondary | ICD-10-CM | POA: Diagnosis not present

## 2020-03-25 DIAGNOSIS — D689 Coagulation defect, unspecified: Secondary | ICD-10-CM | POA: Diagnosis not present

## 2020-03-25 NOTE — Progress Notes (Signed)
This patient returns to my office for at risk foot care.  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  Patient is taking plavix.    This patient is unable to cut nails himself since the patient cannot reach his nails.These nails are painful walking and wearing shoes.  This patient presents for at risk foot care today.  General Appearance  Alert, conversant and in no acute stress.  Vascular  Dorsalis pedis and posterior tibial  pulses are weakly palpable  bilaterally.  Capillary return is within normal limits  bilaterally. Cold feet  Bilaterally.  Absent digital hair  B/L.  Neurologic  Senn-Weinstein monofilament wire test within normal limits  bilaterally. Muscle power within normal limits bilaterally.  Nails Thick disfigured discolored nails with subungual debris  from hallux to fifth toes bilaterally. No evidence of bacterial infection or drainage bilaterally.  Orthopedic  No limitations of motion  feet .  No crepitus or effusions noted.  No bony pathology or digital deformities noted. Midfoot  DJD  B/L.  HAV  B/L  Tailors bunion  B/L.  Skin  normotropic skin with no porokeratosis noted bilaterally.  No signs of infections or ulcers noted.     Onychomycosis    B/L.      Mechanical debridement of nails 1-5  bilaterally performed with a nail nipper.  Filed with dremel without incident.   Return office visit   3 months                   Told patient to return for periodic foot care and evaluation due to potential at risk complications.   Gregory Mayer DPM  

## 2020-06-08 ENCOUNTER — Other Ambulatory Visit: Payer: Self-pay | Admitting: Physician Assistant

## 2020-06-08 DIAGNOSIS — Z952 Presence of prosthetic heart valve: Secondary | ICD-10-CM

## 2020-06-24 ENCOUNTER — Ambulatory Visit: Payer: Medicare Other | Admitting: Podiatry

## 2020-07-13 ENCOUNTER — Telehealth: Payer: Self-pay | Admitting: Cardiology

## 2020-07-13 NOTE — Telephone Encounter (Signed)
New Message:     Pt wants to know if he needs to keep his appt on 07-16-20 with Dr [name], since he have an Echo and see [name] on 08-05-20?

## 2020-07-13 NOTE — Telephone Encounter (Signed)
Message routed to [facility] to advise if 07/19/20 appt is necessary given valve clinic f/u appt in 08/05/20

## 2020-07-14 NOTE — Telephone Encounter (Signed)
Spoke to Dr [name]. If patient is doing okay  6/20 appointment is not needed  keep appointment for echo and July appointment with K. Grandville Silos  f/u s/p TAVR.  Called patient ,no answer left detailed message on both mobile and home phone  with information.  Will try again to make sure patient receive the message.

## 2020-07-19 ENCOUNTER — Ambulatory Visit: Payer: Medicare Other | Admitting: Cardiology

## 2020-07-20 NOTE — Telephone Encounter (Signed)
Late entry called patient  06/2020. No answer left message.  Patient must received message patient did not show for appointment . Appointment cancelled 07/19/20

## 2020-08-05 ENCOUNTER — Ambulatory Visit (HOSPITAL_COMMUNITY): Payer: Medicare Other | Attending: Physician Assistant

## 2020-08-05 ENCOUNTER — Encounter: Payer: Self-pay | Admitting: Podiatry

## 2020-08-05 ENCOUNTER — Ambulatory Visit (INDEPENDENT_AMBULATORY_CARE_PROVIDER_SITE_OTHER): Payer: Medicare Other | Admitting: Physician Assistant

## 2020-08-05 ENCOUNTER — Ambulatory Visit (INDEPENDENT_AMBULATORY_CARE_PROVIDER_SITE_OTHER): Payer: Medicare Other | Admitting: Podiatry

## 2020-08-05 ENCOUNTER — Encounter: Payer: Self-pay | Admitting: Physician Assistant

## 2020-08-05 ENCOUNTER — Other Ambulatory Visit: Payer: Self-pay

## 2020-08-05 VITALS — BP 108/70 | HR 58 | Ht 68.0 in | Wt 161.6 lb

## 2020-08-05 DIAGNOSIS — I5032 Chronic diastolic (congestive) heart failure: Secondary | ICD-10-CM

## 2020-08-05 DIAGNOSIS — E1159 Type 2 diabetes mellitus with other circulatory complications: Secondary | ICD-10-CM

## 2020-08-05 DIAGNOSIS — I2510 Atherosclerotic heart disease of native coronary artery without angina pectoris: Secondary | ICD-10-CM | POA: Diagnosis not present

## 2020-08-05 DIAGNOSIS — Z9861 Coronary angioplasty status: Secondary | ICD-10-CM

## 2020-08-05 DIAGNOSIS — D689 Coagulation defect, unspecified: Secondary | ICD-10-CM

## 2020-08-05 DIAGNOSIS — B351 Tinea unguium: Secondary | ICD-10-CM

## 2020-08-05 DIAGNOSIS — I10 Essential (primary) hypertension: Secondary | ICD-10-CM | POA: Diagnosis not present

## 2020-08-05 DIAGNOSIS — Z952 Presence of prosthetic heart valve: Secondary | ICD-10-CM

## 2020-08-05 DIAGNOSIS — M79676 Pain in unspecified toe(s): Secondary | ICD-10-CM

## 2020-08-05 LAB — ECHOCARDIOGRAM COMPLETE
AR max vel: 2.31 cm2
AV Area VTI: 2.1 cm2
AV Area mean vel: 2.32 cm2
AV Mean grad: 9.3 mmHg
AV Peak grad: 18.3 mmHg
Ao pk vel: 2.14 m/s
Area-P 1/2: 2.07 cm2
S' Lateral: 2.8 cm

## 2020-08-05 MED ORDER — CLINDAMYCIN HCL 300 MG PO CAPS: ORAL_CAPSULE | ORAL | 2 refills | Status: AC

## 2020-08-05 NOTE — Progress Notes (Signed)
This patient returns to my office for at risk foot care.  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  Patient is taking plavix.    This patient is unable to cut nails himself since the patient cannot reach his nails.These nails are painful walking and wearing shoes.  This patient presents for at risk foot care today.  General Appearance  Alert, conversant and in no acute stress.  Vascular  Dorsalis pedis and posterior tibial  pulses are weakly palpable  bilaterally.  Capillary return is within normal limits  bilaterally. Cold feet  Bilaterally.  Absent digital hair  B/L.  Neurologic  Senn-Weinstein monofilament wire test within normal limits  bilaterally. Muscle power within normal limits bilaterally.  Nails Thick disfigured discolored nails with subungual debris  from hallux to fifth toes bilaterally. No evidence of bacterial infection or drainage bilaterally.  Orthopedic  No limitations of motion  feet .  No crepitus or effusions noted.  No bony pathology or digital deformities noted. Midfoot  DJD  B/L.  HAV  B/L  Tailors bunion  B/L.  Skin  normotropic skin with no porokeratosis noted bilaterally.  No signs of infections or ulcers noted.     Onychomycosis    B/L.      Mechanical debridement of nails 1-5  bilaterally performed with a nail nipper.  Filed with dremel without incident.   Return office visit   3 months                   Told patient to return for periodic foot care and evaluation due to potential at risk complications.   Alishah Schulte DPM  

## 2020-08-05 NOTE — Progress Notes (Signed)
[facility]                                       Cardiology Office Note    Date:  08/06/2020   ID:  Victor [name] March 06, 1931, MRN [id number]  PCP:  Victor Hartigan, MD  Cardiologist: Victor Hew, MD / Dr. Angelena [name] & Dr. Roxy [name] (TAVR)   CC: 1 year s/p TAVR   History of Present Illness:  Victor [name] is a 87 y.o. male with a history of arthritis, carotid artery disease, BPH, CAD, DM, GERD, HLD, HTN, diverticulitis s/p partial colectomy and critical aortic stenosis s/p TAVR (07/29/19) who presents to clinic for follow up.   Patient's cardiac history dates back to 2008 when he first was found to have coronary artery disease associated with abnormal stress test.  He was treated with PCI and stenting of the right coronary artery at that time and has done well.  He has been followed intermittently ever since by Victor [name]. He has developed aortic stenosis which has gradually progressed in severity on follow-up echocardiographic imaging. Echocardiogram performed March 06, 2019 revealed severe aortic stenosis with peak velocity across aortic valve measured 4.4 m/s corresponding to mean transvalvular gradient estimated 42 mmHg and aortic valve area estimated 0.79 cm by VTI.  The DVI was reported 0.23. Left ventricular systolic function remain normal.  Repeat echocardiogram was performed Jun 12, 2019 and confirmed the presence of severe aortic stenosis with peak velocity across aortic valve measured greater than 4.5 m/s corresponding to mean transvalvular gradient estimated 52 mmHg and aortic valve area calculated only 0.49 cm by VTI.  Left ventricular systolic function remain normal.  Patient was seen in follow-up by Victor [name] at [facility] on Jun 18, 2019 at which time he complained of progressive symptoms of exertional shortness of breath and decreased energy.  Diagnostic cardiac catheterization was performed Jun 25, 2019 and  confirmed the presence of severe aortic stenosis with peak to peak and mean transvalvular gradients measured 45 and 44 mmHg by catheterization, respectively.  Patient was noted to have continuous patency of stent in the right coronary artery with otherwise mild nonobstructive coronary artery disease.  Left ventricular systolic function remain normal and right heart pressures were normal.     He was evaluated by the multidisciplinary valve team and underwent successful TAVR with a 29 mm Edwards Sapien 3 THV via the TF approach on 07/29/19. Post operative showed EF 60%, normally functioning TAVR with a mean gradient of 5 mm Hg and no PVL. He was started on asprin and plavix. He had had persistent fatigue that has not improved the way he had hoped after TAVR. 1 month echo showed EF is normal, mild-mod MR, mild-mod TR, normally functioning TAVR with a mean gradient of 8 mm hg and no PVL. Last seen by Victor [name] in 12/2019 and some meds were cut back to see if that helped with fatigue.    Today he presents to clinic for follow up. Here with wife. Overall doing well. Still struggles with fatigue. Gets exhausted after walking around Victor [name]. However, he routinely works out at [employer] with no issues. No CP or SOB. No LE edema, orthopnea or PND. No dizziness or syncope. No blood in stool or urine. No palpitations.   Past Medical History:  Diagnosis Date   Angina  Arthritis    BPH (benign prostatic hypertrophy)    CAD S/P percutaneous coronary angioplasty 07/2006   PCI to RCA - Promus DES 2.5 mm x 23 mm; 2D ECHO - EF >55% --> Cath 05/2019 - ~50-60% pRCA with patent stent.  70% ost D1 (small).     Diabetes mellitus    Diverticulitis    s/p colectomy   GERD (gastroesophageal reflux disease)    H/O Severe aortic stenosis 05/2019   Pre-TAVR mean AVA 52.5 mmHg.  AVA by VTI 0.19 cm   High cholesterol    History of kidney stones    per patient, "a very long time ago and it passed by itself"    Hypertension    "from the diabetes"   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   s/p TAVR with a 29 mm Edwards Sapien 3 via the TF approach with Drs Victor [name] & Victor [name]  - 08/2019 Echo shows normla function, no aI with mena AV gradient 8 mmHg.   Squamous cell skin cancer, nasal tip     Past Surgical History:  Procedure Laterality Date   CARDIAC CATHETERIZATION with PCI  08/27/2006   RCA-mid - 2.5x48mm Promus stent   CATARACT EXTRACTION W/ INTRAOCULAR LENS  IMPLANT, BILATERAL     CATARACT EXTRACTION W/PHACO Right 03/08/2015   Procedure: CATARACT EXTRACTION PHACO AND INTRAOCULAR LENS PLACEMENT (IOC);  Surgeon: Estill Cotta, MD;  Location: [facility] ORS;  Service: Ophthalmology;  Laterality: Right;  [place]: 01:29.4    COLECTOMY  ~ 2000   LEFT HEART CATHETERIZATION WITH CORONARY ANGIOGRAM N/A 03/22/2011   Procedure: LEFT HEART CATHETERIZATION WITH CORONARY ANGIOGRAM;  Surgeon: Leonie Man, MD;  Location: [facility] CATH LAB;  Service: Cardiovascular::: Patent RCA stent w/ progression of pRCA Dz to ~50-60%. Progression of oD3 lesion to 60-70% - not optimal for PCI (b/c ostial).  EF 55-60% - no RWMA. Med Rx.  Aortic valve gradient: Peak 22 mmHg, mean 13 mmHg   NM MYOVIEW LTD  08/2014   LOW RISK. NORMAL.  EF 45-54%.    RIGHT/LEFT HEART CATH AND CORONARY ANGIOGRAPHY N/A 06/25/2019   Procedure: RIGHT/LEFT HEART CATH AND CORONARY ANGIOGRAPHY;  Surgeon: Leonie Man, MD;  Location: [facility] CV LAB;  Severe AS (mean gradent 44.2 mmHg). Stable CAD - 50-60% pRCA with widely patent stent.  Small caliber D2 w/ ost 70%. Normal RHC Pressures.   TEE WITHOUT CARDIOVERSION N/A 07/29/2019   Procedure: TRANSESOPHAGEAL ECHOCARDIOGRAM (TEE);  Surgeon: Burnell Blanks, MD;  Location: [facility] CV LAB;  Service: Open Heart Surgery;  Laterality: N/A;   TRANSCATHETER AORTIC VALVE REPLACEMENT, TRANSFEMORAL N/A 07/29/2019   Procedure: TRANSCATHETER AORTIC VALVE REPLACEMENT, TRANSFEMORAL;  Surgeon:  Burnell Blanks, MD;  Location: [facility] INVASIVE CV LAB;; R TFA Approach -> Edwards Sapien 3 THV (size 29 mm, model # [id number], serial # [id number])   TRANSTHORACIC ECHOCARDIOGRAM  06/2016;04/2017   a) EF 60-65%. GR 1 DD. Mod-Severe AS (mean gradient 30 mmHg, peak gradient of 57 mmHg).  b)Normal LV size and function.  EF 60-65%.  GR 1 DD.  Moderate aortic stenosis with estimated valve area between 0.88-0.97 cm.  (Mean gradient 25 mmHg, peak gradient 43 mmHg)   TRANSTHORACIC ECHOCARDIOGRAM  03/2019; 05/2019   a)  Severely calcified aortic valve-severe stenosis.  AVA 0.79 cm.  Mean gradient 42.3 mmHg.  EF 60 to 65%.;; b) Severe/critical AS -V-max 4.85m/s, mean gradient 52-53 mmHg, AVA 0.49 cm. EF 60-65%. No RWMA. Gr 1 DD?Marland Kitchen Mod-Severe LA dilation.  TRANSTHORACIC ECHOCARDIOGRAM  07/2019; 08/2019   a) POD #1 TAVR: EF 60 to 65%.  Moderate LA dilation.  Mild to moderate MR.  Well seated, nl fxning  TAVR valve.  Mean gradient 5 mmHg.;; b) EF 60 to 65%.  Normal RV function.  GR 1 DD.  No R WMA.  Moderate LA dilation.  Mild to moderate MR.  Mild to moderate TR.  Stable Edwards Sapien 29 mmTAVR valve in aortic position -normal function.  Mean gradient 8 mmHg.   TRANSURETHRAL RESECTION OF PROSTATE  ~ 2010    Current Medications: Outpatient Medications Prior to Visit  Medication Sig Dispense Refill   acetaminophen (TYLENOL) 500 MG tablet Take 500 mg by mouth every 6 (six) hours as needed for moderate pain or headache.     Artificial Tear Solution (GENTEAL TEARS OP) Place 1 drop into both eyes daily.     ascorbic acid (VITAMIN C) 500 MG tablet Take 1,000-1,500 mg by mouth See admin instructions. Take 1000 mg in the morning and 1500 mg at night     aspirin EC 81 MG tablet Take 81 mg by mouth daily.     benzonatate (TESSALON) 200 MG capsule Take 200 mg by mouth 3 (three) times daily as needed for cough.     bismuth subsalicylate (PEPTO BISMOL) 262 MG/15ML suspension Take 30 mLs by mouth every 6 (six) hours  as needed for indigestion or diarrhea or loose stools.     calcium carbonate (OS-CAL) 600 MG TABS tablet Take 600 mg by mouth daily.     cetirizine (ZYRTEC) 10 MG tablet Take 10 mg by mouth daily.     Cholecalciferol (VITAMIN D3) 2000 UNITS TABS Take 4,000 Units by mouth 2 (two) times daily.      CINNAMON PO Take 1,000 mg by mouth 2 (two) times daily.     Coenzyme Q10 (COQ-10) 100 MG CAPS Take 100 mg by mouth daily.     Cyanocobalamin (B-12) 2500 MCG TABS Take 2,500 mcg by mouth daily.     donepezil (ARICEPT) 10 MG tablet Take 10 mg by mouth daily.     EPIPEN 2-PAK 0.3 MG/0.3ML SOAJ injection Inject 0.3 mLs as directed as needed for anaphylaxis.      ezetimibe (ZETIA) 10 MG tablet Take 10 mg by mouth daily.     fluticasone (FLONASE) 50 MCG/ACT nasal spray Place 2 sprays into both nostrils 2 (two) times daily as needed for allergies.      gabapentin (NEURONTIN) 100 MG capsule Take 300 mg by mouth at bedtime.      glipiZIDE (GLUCOTROL XL) 2.5 MG 24 hr tablet Take 2.5 mg by mouth daily with breakfast.     Glucosamine HCl 1000 MG TABS Take 1,000 mg by mouth 2 (two) times daily.     ipratropium (ATROVENT) 0.03 % nasal spray Place 2 sprays into both nostrils 2 (two) times daily as needed for rhinitis.     loperamide (IMODIUM A-D) 2 MG tablet Take 2 mg by mouth daily as needed for diarrhea or loose stools.     methocarbamol (ROBAXIN) 500 MG tablet Take 500 mg by mouth at bedtime as needed for muscle spasms.     metoprolol succinate (TOPROL-XL) 25 MG 24 hr tablet Take 0.5 tablets (12.5 mg total) by mouth daily. 45 tablet 3   Multiple Vitamin (MULITIVITAMIN WITH MINERALS) TABS Take 1 tablet by mouth 2 (two) times daily.      Omega 3 1200 MG CAPS Take 2,400 mg by mouth 2 (two)  times daily.      Probiotic CAPS Take 1 capsule by mouth daily.     pseudoephedrine (SUDAFED) 30 MG tablet Take 60 mg by mouth every 4 (four) hours as needed for congestion.     Saw Palmetto 450 MG CAPS Take 900 mg by mouth daily.       triamcinolone cream (KENALOG) 0.1 % Apply 1 application topically 3 (three) times daily as needed for itching.     zinc gluconate 50 MG tablet Take 50 mg by mouth daily.     clindamycin (CLEOCIN) 300 MG capsule Take 2 capsules (600 mg) 1 hour prior to all dental visits. 4 capsule 8   clopidogrel (PLAVIX) 75 MG tablet Take 1 tablet (75 mg total) by mouth daily with breakfast. 90 tablet 1   lansoprazole (PREVACID) 30 MG capsule Take by mouth.     rosuvastatin (CRESTOR) 20 MG tablet Take 1 tablet (20 mg total) by mouth daily. 90 tablet 3   No facility-administered medications prior to visit.     Allergies:   Azithromycin, Cephalosporins, Codeine, Doxycycline, Phenylephrine-guaifenesin, Pseudoephedrine, Amoxicillin, Levofloxacin, Penicillins, Sulfa antibiotics, and Sulfasalazine   Social History   Socioeconomic History   Marital status: Married    Spouse name: Not on file   Number of children: 2   Years of education: Not on file   Highest education level: Not on file  Occupational History   Occupation: Proofreader  Tobacco Use   Smoking status: Never   Smokeless tobacco: Former    Types: Chew    Quit date: 01/31/1996  Vaping Use   Vaping Use: Never used  Substance and Sexual Activity   Alcohol use: Yes    Alcohol/week: 6.0 standard drinks    Types: 6 Glasses of wine per week   Drug use: No   Sexual activity: Yes  Other Topics Concern   Not on file  Social History Narrative   Father of 2, grandfather 74.   Exercise for almost 2 hours a day, doing least 20 minutes on the elliptical trainer. He does his to 4 days a week. He'll also does weights and stretching exercises.   Social Determinants of Health   Financial Resource Strain: Not on file  Food Insecurity: Not on file  Transportation Needs: Not on file  Physical Activity: Not on file  Stress: Not on file  Social Connections: Not on file     Family History:  The patient's family history includes ALS in his  mother; Prostate cancer in his father.     ROS:   Please see the history of present illness.    ROS All other systems reviewed and are negative.   PHYSICAL EXAM:   VS:  BP 108/70   Pulse (!) 58   Ht 5\' 8"  (1.727 m)   Wt 161 lb 9.6 oz (73.3 kg)   SpO2 94%   BMI 24.57 kg/m    GEN: Well nourished, well developed, in no acute distress HEENT: normal Neck: no JVD or masses Cardiac: RRR; no murmurs, rubs, or gallops,no edema  Respiratory:  clear to auscultation bilaterally, normal work of breathing GI: soft, nontender, nondistended, + BS MS: no deformity or atrophy Skin: warm and dry, no rash   Neuro:  Alert and Oriented x 3, Strength and sensation are intact Psych: euthymic mood, full affect   Wt Readings from Last 3 Encounters:  08/05/20 161 lb 9.6 oz (73.3 kg)  01/13/20 165 lb (74.8 kg)  09/11/19 170 lb (77.1 kg)  Studies/Labs Reviewed:   EKG:  EKG is NOT ordered today.  Recent Labs: 08/07/2019: BUN 19; Creatinine, Ser 0.88; Hemoglobin 13.8; Platelets 172; Potassium 4.7; Sodium 141; TSH 3.780   Lipid Panel    Component Value Date/Time   CHOL 127 03/22/2011 0545   TRIG 133 03/22/2011 0545   HDL 48 03/22/2011 0545   CHOLHDL 2.6 03/22/2011 0545   VLDL 27 03/22/2011 0545   LDLCALC 52 03/22/2011 0545    Additional studies/ records that were reviewed today include:  TAVR OPERATIVE NOTE     Date of Procedure:                07/29/2019   Preoperative Diagnosis:      Severe Aortic Stenosis    Postoperative Diagnosis:    Same    Procedure:        Transcatheter Aortic Valve Replacement - Percutaneous Right Transfemoral Approach             Edwards Sapien 3 THV (size 29 mm, model # [id number], serial # [id number])              Co-Surgeons:                        Lauree Chandler, MD and Valentina Gu. Victor Manns, MD    Anesthesiologist:                  Arabella Merles, MD   Echocardiographer:              Sanda Klein, MD   Pre-operative Echo Findings: Severe  aortic stenosis Normal left ventricular systolic function   Post-operative Echo Findings: No paravalvular leak Normal left ventricular systolic function     ____________________     Echo 07/30/19: IMPRESSIONS  1. Left ventricular ejection fraction, by estimation, is 60 to 65%. The  left ventricle has normal function. The left ventricle has no regional  wall motion abnormalities. There is mild concentric left ventricular  hypertrophy. Left ventricular diastolic  function could not be evaluated.   2. Right ventricular systolic function is normal. The right ventricular  size is normal. Tricuspid regurgitation signal is inadequate for assessing  PA pressure.   3. Left atrial size was moderately dilated.   4. The mitral valve is degenerative. Mild to moderate mitral valve  regurgitation.   5. There is no perivalvular leak. The aortic valve has been  repaired/replaced. Aortic valve regurgitation is not visualized. There is  a Transport planner prosthetic (TAVR) valve present in the aortic position.  Procedure Date: 07/29/2019. Echo findings are  consistent with normal structure and function of the aortic valve  prosthesis. Aortic valve mean gradient measures 5.0 mmHg. Aortic valve  Vmax measures 1.66 m/s.  Comparison(s): No significant change from prior study. Prior images  reviewed side by side.  ________________   Echo 09/11/19 IMPRESSIONS  1. Left ventricular ejection fraction, by estimation, is 60 to 65%. The  left ventricle has normal function. The left ventricle has no regional  wall motion abnormalities. There is mild concentric left ventricular  hypertrophy. Left ventricular diastolic  parameters are consistent with Grade I diastolic dysfunction (impaired  relaxation). Elevated left ventricular end-diastolic pressure.   2. Right ventricular systolic function is normal. The right ventricular  size is normal. There is normal pulmonary artery systolic pressure. The  estimated  right ventricular systolic pressure is 51.7 mmHg.   3. Left atrial size was moderately dilated.   4. The mitral  valve is degenerative. Mild to moderate mitral valve  regurgitation. No evidence of mitral stenosis.   5. Tricuspid valve regurgitation is mild to moderate.   6. The aortic valve has been repaired/replaced. Aortic valve  regurgitation is trivial. There is a 29 mm Edwards Sapien prosthetic  (TAVR) valve present in the aortic position. Procedure Date: 07/29/2019.  Echo findings are consistent with normal structure  and function of the aortic valve prosthesis. Aortic valve area, by VTI  measures 1.70 cm. Aortic valve mean gradient measures 8.0 mmHg. Aortic  valve Vmax measures 1.75 m/s.   7. The inferior vena cava is normal in size with greater than 50%  respiratory variability, suggesting right atrial pressure of 3 mmHg.   Comparison(s): No significant change from prior study.    ____________________   08/05/20 IMPRESSIONS  1. 29 mm S3 07/29/2019. Vmax 2.1 m/s, MG 9 mmHG, EOA 2.1 cm2, DI 0.40. No regurgitation or PVL. Normal prosthesis. The aortic valve has been repaired/replaced. Aortic valve regurgitation is not visualized. There is a 29 mm Sapien prosthetic (TAVR) valve present in the aortic position. Procedure Date: 07/29/2019. Echo findings are consistent with normal structure and function of the aortic valve prosthesis.  2. Left ventricular ejection fraction, by estimation, is 55 to 60%. The left ventricle has normal function. The left ventricle has no regional wall motion abnormalities. There is mild concentric left ventricular hypertrophy. Left ventricular diastolic parameters are consistent with Grade I diastolic dysfunction (impaired relaxation).  3. Right ventricular systolic function is normal. The right ventricular size is mildly enlarged. There is normal pulmonary artery systolic pressure. The estimated right ventricular systolic pressure is 65.6 mmHg.  4. Left atrial  size was mildly dilated.  5. Right atrial size was mildly dilated.  6. The mitral valve is degenerative. Mild mitral valve regurgitation. No evidence of mitral stenosis.  7. The inferior vena cava is normal in size with greater than 50% respiratory variability, suggesting right atrial pressure of 3 mmHg.   Comparison(s): No significant change from prior study.     ASSESSMENT & PLAN:    Severe AS s/p TAVR: echo today shows EF 55%, mild RVE, normally functioning TAVR with a mean gradient of 9 mmHg and no PVL. Mild MR. He has NYHA class II symptoms; mostly of fatigue but is able to vigorously exercise with no issues. SBE prophylaxis discussed; he has clindamycin due to a PCN allergy and severeal other abx allergies (only option although not reccommedned given increased risk for cdif.). Continue on aspirin alone.   HTN: Bp well controlled. No changes made.    CAD: pre TAVR cath showed stable coronary arteries with 50 to 60% proximal RCA followed by brief ecstatic segment and then widely patent mid stent.  Otherwise, small caliber 1st Diag ~70% ostial stenosis. Continue medical therapy.    Chronic diastolic CHF: appears euvolemic off diuretics.     Medication Adjustments/Labs and Tests Ordered: Current medicines are reviewed at length with the patient today.  Concerns regarding medicines are outlined above.  Medication changes, Labs and Tests ordered today are listed in the Patient Instructions below. Patient Instructions  Medication Instructions:  No changes *If you need a refill on your cardiac medications before your next appointment, please call your pharmacy*   Lab Work: none If you have labs (blood work) drawn today and your tests are completely normal, you will receive your results only by: [place] (if you have MyChart) OR A paper copy in the mail If you have  any lab test that is abnormal or we need to change your treatment, we will call you to review the  results.   Testing/Procedures: none   Follow-Up: As planned with Victor [name]      Signed, Victor Form, PA-C  08/06/2020 5:26 AM    [address], [place], [place]  [address] Phone: [phone]; Fax: [phone]

## 2020-08-05 NOTE — Patient Instructions (Signed)
Medication Instructions:  No changes *If you need a refill on your cardiac medications before your next appointment, please call your pharmacy*   Lab Work: none If you have labs (blood work) drawn today and your tests are completely normal, you will receive your results only by: [place] (if you have MyChart) OR A paper copy in the mail If you have any lab test that is abnormal or we need to change your treatment, we will call you to review the results.   Testing/Procedures: none   Follow-Up: As planned with Dr. [name]

## 2020-08-18 HISTORY — PX: TRANSTHORACIC ECHOCARDIOGRAM: SHX275

## 2020-10-12 ENCOUNTER — Other Ambulatory Visit: Payer: Self-pay | Admitting: Cardiology

## 2020-11-04 ENCOUNTER — Ambulatory Visit (INDEPENDENT_AMBULATORY_CARE_PROVIDER_SITE_OTHER): Payer: Medicare Other | Admitting: Podiatry

## 2020-11-04 ENCOUNTER — Encounter: Payer: Self-pay | Admitting: Podiatry

## 2020-11-04 ENCOUNTER — Other Ambulatory Visit: Payer: Self-pay

## 2020-11-04 ENCOUNTER — Encounter (INDEPENDENT_AMBULATORY_CARE_PROVIDER_SITE_OTHER): Payer: Self-pay

## 2020-11-04 DIAGNOSIS — E1159 Type 2 diabetes mellitus with other circulatory complications: Secondary | ICD-10-CM

## 2020-11-04 DIAGNOSIS — M79676 Pain in unspecified toe(s): Secondary | ICD-10-CM

## 2020-11-04 DIAGNOSIS — D689 Coagulation defect, unspecified: Secondary | ICD-10-CM | POA: Diagnosis not present

## 2020-11-04 DIAGNOSIS — B351 Tinea unguium: Secondary | ICD-10-CM

## 2020-11-04 NOTE — Progress Notes (Signed)
This patient returns to my office for at risk foot care.  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  Patient is taking plavix.    This patient is unable to cut nails himself since the patient cannot reach his nails.These nails are painful walking and wearing shoes.  This patient presents for at risk foot care today.  General Appearance  Alert, conversant and in no acute stress.  Vascular  Dorsalis pedis and posterior tibial  pulses are weakly palpable  bilaterally.  Capillary return is within normal limits  bilaterally. Cold feet  Bilaterally.  Absent digital hair  B/L.  Neurologic  Senn-Weinstein monofilament wire test within normal limits  bilaterally. Muscle power within normal limits bilaterally.  Nails Thick disfigured discolored nails with subungual debris  from hallux to fifth toes bilaterally. No evidence of bacterial infection or drainage bilaterally.  Orthopedic  No limitations of motion  feet .  No crepitus or effusions noted.  No bony pathology or digital deformities noted. Midfoot  DJD  B/L.  HAV  B/L  Tailors bunion  B/L.  Skin  normotropic skin with no porokeratosis noted bilaterally.  No signs of infections or ulcers noted.     Onychomycosis    B/L.      Mechanical debridement of nails 1-5  bilaterally performed with a nail nipper.  Filed with dremel without incident.   Return office visit   3 months                   Told patient to return for periodic foot care and evaluation due to potential at risk complications.   Travonne Schowalter DPM  

## 2020-12-21 ENCOUNTER — Other Ambulatory Visit: Payer: Self-pay

## 2020-12-21 ENCOUNTER — Ambulatory Visit (INDEPENDENT_AMBULATORY_CARE_PROVIDER_SITE_OTHER): Payer: Medicare Other | Admitting: Cardiology

## 2020-12-21 ENCOUNTER — Encounter: Payer: Self-pay | Admitting: Cardiology

## 2020-12-21 VITALS — BP 90/60 | HR 56 | Ht 68.0 in | Wt 162.6 lb

## 2020-12-21 DIAGNOSIS — Z952 Presence of prosthetic heart valve: Secondary | ICD-10-CM

## 2020-12-21 DIAGNOSIS — I2510 Atherosclerotic heart disease of native coronary artery without angina pectoris: Secondary | ICD-10-CM | POA: Diagnosis not present

## 2020-12-21 DIAGNOSIS — I10 Essential (primary) hypertension: Secondary | ICD-10-CM

## 2020-12-21 DIAGNOSIS — Z9861 Coronary angioplasty status: Secondary | ICD-10-CM | POA: Diagnosis not present

## 2020-12-21 DIAGNOSIS — E785 Hyperlipidemia, unspecified: Secondary | ICD-10-CM

## 2020-12-21 NOTE — Patient Instructions (Addendum)
Medication Instructions:  Stopping Metoprolol  follow instructions below   For the next 2 weeks take 1/2 tablet of 25 mg  Metoprolol succinate  everyday  and then next 2 weeks take 1/2 tablet of 25 mg every other day then stop medication all together.  *If you need a refill on your cardiac medications before your next appointment, please call your pharmacy*   Lab Work:  Not needed    Testing/Procedures:  Not needed   Follow-Up: At [facility], you and your health needs are our priority.  As part of our continuing mission to provide you with exceptional heart care, we have created designated Provider Care Teams.  These Care Teams include your primary Cardiologist (physician) and Advanced Practice Providers (APPs -  Physician Assistants and Nurse Practitioners) who all work together to provide you with the care you need, when you need it.  We recommend signing up for the patient portal called "MyChart".  Sign up information is provided on this After Visit Summary.  MyChart is used to connect with patients for Virtual Visits (Telemedicine).  Patients are able to view lab/test results, encounter notes, upcoming appointments, etc.  Non-urgent messages can be sent to your provider as well.   To learn more about what you can do with MyChart, go to [email or web address].    Your next appointment:   6 month(s)  The format for your next appointment:   In Person  Provider:   [name], MD     Other Instructions

## 2020-12-21 NOTE — Progress Notes (Signed)
Primary Care Provider: Sofie Hartigan, MD Cardiologist: [name], MD Electrophysiologist: None  Clinic Note: Chief Complaint  Patient presents with   Follow-up    Annual   Coronary Artery Disease    No chest pain   Cardiac Valve Problem    Status post TAVR.  Follow-up echo results.    ===================================  ASSESSMENT/PLAN   Problem List Items Addressed This Visit       Cardiology Problems   Essential hypertension (Chronic)    No longer hypertensive.  In fact he is actually hypotensive.  Weaning off Toprol.      Relevant Orders   EKG 12-Lead (Completed)   Hyperlipidemia with target LDL less than 70 (Chronic)    Converted from simvastatin to rosuvastatin last visit.  Lipids just checked by PCP showed LDL of 52.  Had been 40 back in April.  Well-controlled.  Continue current dose of rosuvastatin.      CAD S/P percutaneous coronary angioplasty --> PCI RCA Promus DES 2.5 mm x 23 mm (Chronic)    Distant history of RCA PCI with patent stent on pre-TAVR cath. After initially being on Plavix through January 2022, he was placed back on aspirin 81 mg.  Finally started to get some energy level.  Valve looks stable on echo.  Plan: Still having fatigue, blood pressure is quite low.-We will wean off beta-blocker over the next 2 weeks to completely off. No longer on ARB.  This was also discontinued On stable dose of rosuvastatin. Converted from Plavix to aspirin now.  Okay to hold 5 to 7 days preop for surgery or procedure.      Relevant Orders   EKG 12-Lead (Completed)   Atherosclerotic heart disease of native coronary artery without angina pectoris (Chronic)    History of one-vessel CAD with RCA PCI.  Pre-TAVR cath showed patent stent.  On aspirin and rosuvastatin.  ARB stopped because of hypotension, and we are now weaning off beta-blocker because of fatigue and hypotension.        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 NYHA class I at worst symptoms because of fatigue. Will continue following up echoes for the next few years annually.      Relevant Orders   EKG 12-Lead (Completed)    ===================================  HPI:    Victor [name] is a 87 y.o. male with a PMH notable for CAD, carotid disease, HTN, HLD and Critical AS-s/p TAVR (June 2021) who presents today for annual follow-up.  2008 CAD -> PCI RCA  Pre-TAVR cath 05/2019: Patent RCA stent. pRCA ~55%, and 70% small D2. SEVERE AORTIC STENOSIS (progression to severe February 2021 -> mean gradient 42 mmHg.) ->  Became symptomatic early May 2021 with.  AVG up to 50 mmHg. TAVR procedure Date 07/29/2019: 29 mm Sapien prosthetic (TAVR) valve  I last saw [name] on 01/13/2020 -> this is his first follow-up with me after his TAVR.  He was doing well.  No further chest discomfort or dyspnea with rest or exertion.  Energy level notably improved.  NYHA I fatigue noted only.  Was somewhat frustrated that he had a "expected benefit from TAVR".  Still having fatigue, sleeping all the time..  Recent Hospitalizations: None  He was seen by [name], PA on August 06, 2020 1 year post TAVR -he noted that he is doing very well.  Still  having some fatigue issues.  Gets exhausted walking on [employer].  But no chest pain or pressure.  No exertional dyspnea.  No CHF symptoms of PND, orthopnea.  No syncope or near syncope.  Reviewed  CV studies:    The following studies were reviewed today: (if available, images/films reviewed: From Epic Chart or Care Everywhere) TTE 07/2020: 29 mm Sapien bioprosthetic TAVR well-positioned.  No PVL.  Mean gradient 9 mmHg-no residual AS.  Normal LV function with EF 55 to 60%.  No R WMA.  Mild concentric LVH.  GR 1 DD with mild LA and RA dilation.  Normal RV size and function.  Normal RVP/RAP.  Mild MV  degeneration but no MR or MS...  Interval History:   [name] returns here today overall doing much better.  He finally says that his energy levels and is improving.  He actually says he is trying to work out maybe 3 to 4 days a week for about an hour after walking.  Still has some fatigue, but much better than last year.  No heart failure symptoms of PND, orthopnea or edema.  He still gets little bit tired with doing more than usual walking, but not with routine activity.  Does not really get short of breath, not as tired/fatigued.  CV Review of Symptoms (Summary) Cardiovascular ROS: no chest pain or dyspnea on exertion positive for - -exercise intolerance, fatigue negative for - edema, irregular heartbeat, orthopnea, palpitations, paroxysmal nocturnal dyspnea, rapid heart rate, shortness of breath, or lightheadedness, dizziness, wooziness or syncope/near syncope, TIA/amaurosis fugax, claudication  REVIEWED OF SYSTEMS   Review of Systems  Constitutional:  Positive for malaise/fatigue (Still has fatigue, but not better.).  HENT:  Negative for congestion.   Respiratory:  Negative for cough and shortness of breath.   Cardiovascular:        Per HPI  Gastrointestinal:  Negative for blood in stool, constipation and melena.  Genitourinary:  Negative for hematuria.  Musculoskeletal:  Positive for joint pain (Mild arthritis).  Neurological:  Negative for dizziness, focal weakness and weakness.  Psychiatric/Behavioral:  Positive for memory loss. Negative for depression. The patient is not nervous/anxious and does not have insomnia.   All other systems reviewed and are negative.  I have reviewed and (if needed) personally updated the patient's problem list, medications, allergies, past medical and surgical history, social and family history.   PAST MEDICAL HISTORY   Past Medical History:  Diagnosis Date   Angina    Arthritis    BPH (benign prostatic hypertrophy)    CAD S/P percutaneous  coronary angioplasty 07/2006   PCI to RCA - Promus DES 2.5 mm x 23 mm; 2D ECHO - EF >55% --> Cath 05/2019 - ~50-60% pRCA with patent stent.  70% ost D1 (small).     Diabetes mellitus    Diverticulitis    s/p colectomy   GERD (gastroesophageal reflux disease)    H/O Severe aortic stenosis 05/2019   Pre-TAVR mean AVA 52.5 mmHg.  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  Squamous cell skin cancer, nasal tip     PAST SURGICAL HISTORY   Past Surgical History:  Procedure Laterality Date   CARDIAC CATHETERIZATION with PCI  08/27/2006   RCA-mid - 2.5x34m Promus stent   LEFT HEART CATHETERIZATION WITH CORONARY ANGIOGRAM N/A 03/22/2011   Procedure: LEFT HEART CATHETERIZATION WITH CORONARY ANGIOGRAM;  Surgeon: DLeonie Man MD;  [facility];  Service: Cardiovascular::: Patent RCA stent w/ progression of pRCA Dz to ~50-60%. Progression of oD3 lesion to 60-70% - not optimal for PCI (b/c ostial).  EF 55-60% - no RWMA. Med Rx.  Aortic valve gradient: Peak 22 mmHg, mean 13 mmHg   NM [facility]IEW LTD  08/2014   LOW RISK. NORMAL.  EF 45-54%.    RIGHT/LEFT HEART CATH AND CORONARY ANGIOGRAPHY N/A 06/25/2019   Procedure: RIGHT/LEFT HEART CATH AND CORONARY ANGIOGRAPHY;  Surgeon: HLeonie Man MD;  [facility];  Severe AS (mean gradent 44.2 mmHg). Stable CAD - 50-60% pRCA with widely patent stent.  Small caliber D2 w/ ost 70%. Normal RHC Pressures.   TEE WITHOUT CARDIOVERSION N/A 07/29/2019   Procedure: TRANSESOPHAGEAL ECHOCARDIOGRAM (TEE);  Surgeon: MBurnell Blanks MD;  [facility];  Service: Open Heart  Surgery;  Laterality: N/A;   TRANSCATHETER AORTIC VALVE REPLACEMENT, TRANSFEMORAL N/A 07/29/2019   Procedure: TRANSCATHETER AORTIC VALVE REPLACEMENT, TRANSFEMORAL;  Surgeon: MBurnell Blanks MD;  Location: [facility] LAB;; R TFA Approach -> Edwards Sapien 3 THV (size 29 mm, model # [id number] serial # [id number]   TRANSTHORACIC ECHOCARDIOGRAM  03/2019   a) 03/2019: calcified aortic valve-severe stenosis.  AVA 0.79 cm.  Mean gradient 42.3 mmHg.  EF 60 to 65%.;; b) 05/2019: Severe/critical AS -V-max 4.59m, mean gradient 52-53 mmHg, AVA 0.49 cm. EF 60-65%. No RWMA. Gr 1 DD?. Marland Kitchenod-Severe LA dilation   TRANSTHORACIC ECHOCARDIOGRAM  07/2019   a) 07/2019: POD #1 TAVR: EF 60 to 65%.  Moderate LA dilation.  Mild to moderate MR.  Well seated, nl fxning  TAVR valve.  Mean gradient 5 mmHg.;; b) 08/2019: EF 60 to 65%.  Normal RV function.  GR 1 DD.  No R WMA.  Moderate LA dilation.  Mild to moderate MR.  Mild to moderate TR.  Stable Edwards Sapien 29 mmTAVR valve in aortic position -normal function.  Mean gradient 8 mmHg.   R&LHC 06/25/2019: Severe AS (mean gradent 44.2 mmHg). Stable CAD - 50-60% pRCA with widely patent stent.  Small caliber D1 w/ ost 70%. Normal RHC Pressures.     Immunization History  Administered Date(s) Administered   Influenza Split 12/01/2014   Influenza,inj,Quad PF,6+ Mos 11/28/2019   Influenza-Unspecified 10/26/2011, 11/08/2012, 11/27/2013, 12/01/2014, 11/12/2015, 10/25/2016   PFIZER(Purple Top)SARS-COV-2 Vaccination 05/09/2019, 05/30/2019   Pneumococcal Polysaccharide-23 01/31/1999   Tdap 09/06/2016    MEDICATIONS/ALLERGIES   Current Meds  Medication Sig   acetaminophen (TYLENOL) 500 MG tablet Take 500 mg by mouth every 6 (six) hours as needed for moderate pain or headache.   Artificial Tear Solution (GENTEAL TEARS OP) Place 1 drop into both eyes daily.   ascorbic acid (VITAMIN C) 500 MG tablet Take 1,000-1,500 mg by mouth See admin instructions. Take 1000 mg in the  morning and 1500 mg at night   aspirin EC 81 MG tablet Take 81 mg by mouth daily.   benzonatate (TESSALON) 200 MG capsule Take 200 mg by mouth 3 (three) times daily as needed for cough.   bismuth subsalicylate (PEPTO BISMOL) 262 MG/15ML suspension Take 30  mLs by mouth every 6 (six) hours as needed for indigestion or diarrhea or loose stools.   calcium carbonate (OS-CAL) 600 MG TABS tablet Take 600 mg by mouth daily.   cetirizine (ZYRTEC) 10 MG tablet Take 10 mg by mouth daily.   Cholecalciferol (VITAMIN D3) 2000 UNITS TABS Take 4,000 Units by mouth 2 (two) times daily.    CINNAMON PO Take 1,000 mg by mouth 2 (two) times daily.   clindamycin (CLEOCIN) 300 MG capsule Take 2 capsules (600 mg) 1 hour prior to all dental visits.   Coenzyme Q10 (COQ-10) 100 MG CAPS Take 100 mg by mouth daily.   Cyanocobalamin (B-12) 2500 MCG TABS Take 2,500 mcg by mouth daily.   donepezil (ARICEPT) 10 MG tablet Take 10 mg by mouth daily.   EPIPEN 2-PAK 0.3 MG/0.3ML SOAJ injection Inject 0.3 mLs as directed as needed for anaphylaxis.    ezetimibe (ZETIA) 10 MG tablet Take 10 mg by mouth daily.   fluticasone (FLONASE) 50 MCG/ACT nasal spray Place 2 sprays into both nostrils 2 (two) times daily as needed for allergies.    gabapentin (NEURONTIN) 100 MG capsule Take 300 mg by mouth at bedtime.    glipiZIDE (GLUCOTROL XL) 2.5 MG 24 hr tablet Take 2.5 mg by mouth daily with breakfast.   Glucosamine HCl 1000 MG TABS Take 1,000 mg by mouth 2 (two) times daily.   ipratropium (ATROVENT) 0.03 % nasal spray Place 2 sprays into both nostrils 2 (two) times daily as needed for rhinitis.   lansoprazole (PREVACID) 30 MG capsule Take by mouth.   loperamide (IMODIUM A-D) 2 MG tablet Take 2 mg by mouth daily as needed for diarrhea or loose stools.   methocarbamol (ROBAXIN) 500 MG tablet Take 500 mg by mouth at bedtime as needed for muscle spasms.   Multiple Vitamin (MULITIVITAMIN WITH MINERALS) TABS Take 1 tablet by mouth 2 (two) times  daily.    Omega 3 1200 MG CAPS Take 2,400 mg by mouth 2 (two) times daily.    Probiotic CAPS Take 1 capsule by mouth daily.   pseudoephedrine (SUDAFED) 30 MG tablet Take 60 mg by mouth every 4 (four) hours as needed for congestion.   Saw Palmetto 450 MG CAPS Take 900 mg by mouth daily.    triamcinolone cream (KENALOG) 0.1 % Apply 1 application topically 3 (three) times daily as needed for itching.   zinc gluconate 50 MG tablet Take 50 mg by mouth daily.   [DISCONTINUED] metoprolol succinate (TOPROL-XL) 25 MG 24 hr tablet Take 1 tablet by mouth once daily    Allergies  Allergen Reactions   Azithromycin Rash and Anaphylaxis   Cephalosporins Anaphylaxis and Other (See Comments)   Codeine Anaphylaxis, Itching and Rash   Doxycycline Anaphylaxis   Phenylephrine-Guaifenesin     Unknown reaction    Pseudoephedrine Other (See Comments)    unknown    Amoxicillin Rash   Levofloxacin Rash   Penicillins Rash    Other reaction(s): Unknown   Sulfa Antibiotics Rash   Sulfasalazine Rash    SOCIAL HISTORY/FAMILY HISTORY   Reviewed in Epic:  Pertinent findings:  Social History   Tobacco Use   Smoking status: Never   Smokeless tobacco: Former    Types: Chew    Quit date: 01/31/1996  Vaping Use   Vaping Use: Never used  Substance Use Topics   Alcohol use: Yes    Alcohol/week: 6.0 standard drinks    Types: 6 Glasses of wine per week   Drug use:  No   Social History   Social History Narrative   Father of 2, grandfather 69.   Exercise for almost 2 hours a day, doing least 20 minutes on the elliptical trainer. He does his to 4 days a week. He'll also does weights and stretching exercises.    OBJCTIVE -PE, EKG, labs   Wt Readings from Last 3 Encounters:  12/21/20 162 lb 9.6 oz (73.8 kg)  08/05/20 161 lb 9.6 oz (73.3 kg)  01/13/20 165 lb (74.8 kg)    Physical Exam: BP 90/60 (BP Location: Right Arm)   Pulse (!) 56   Ht '5\' 8"'  (1.727 m)   Wt 162 lb 9.6 oz (73.8 kg)   SpO2 97%   BMI  24.72 kg/m  Physical Exam Vitals reviewed.  Constitutional:      General: He is not in acute distress.    Appearance: Normal appearance. He is normal weight. He is not ill-appearing.  HENT:     Head: Normocephalic and atraumatic.  Neck:     Vascular: No carotid bruit (Radiated soft aortic murmur.) or JVD.  Cardiovascular:     Rate and Rhythm: Normal rate and regular rhythm. No extrasystoles are present.    Chest Wall: PMI is not displaced.     Pulses: Normal pulses.     Heart sounds: S1 normal and S2 normal. Murmur (Soft 1/6 SEM at RUSB-neck) heard.    No friction rub. No gallop.  Pulmonary:     Effort: Pulmonary effort is normal. No respiratory distress.     Breath sounds: Normal breath sounds.  Chest:     Chest wall: No tenderness.  Musculoskeletal:        General: No swelling. Normal range of motion.     Cervical back: Normal range of motion and neck supple.  Skin:    General: Skin is warm and dry.  Neurological:     General: No focal deficit present.     Mental Status: He is alert and oriented to person, place, and time.  Psychiatric:        Mood and Affect: Mood normal.        Behavior: Behavior normal.        Thought Content: Thought content normal.        Judgment: Judgment normal.     Adult ECG Report  Rate: 56;  Rhythm: sinus bradycardia, sinus arrhythmia, and 1  AVB, septal MI, age-indeterminate. ;   Narrative Interpretation: Stable  Recent Labs: Reviewed from care everywhere. [place] Related to Comprehensive Metabolic Panel (CMP) Component 11/25/20 05/13/20 10/09/19   Glucose 115 High  126 High  118 High   Sodium 142 140 141  Potassium 4.8 4.5 4.7  Chloride 104 104 104  Carbon Dioxide (CO2) 34.7 High  31.5 32.6 High   Urea Nitrogen (BUN) '15 16 17  ' Creatinine 1.0 0.9 0.8  Glomerular Filtration Rate (eGFR), MDRD Estimate 71 80 92  Calcium 9.3 9.3 9.4  AST  '23 27 21  ' ALT  '21 23 21  ' Alk Phos (alkaline Phosphatase) 83 70 90   Albumin 4.2 4.2 4.3  Bilirubin, Total 0.6 0.5 0.7  Protein, Total 6.4 6.3 6.6   [place] Related to Lipid Panel w/calc LDL Component 11/25/20 05/13/20 10/09/19   Cholesterol, Total 120 115 137  Triglyceride 115 167 141  HDL (High Density Lipoprotein) Cholesterol 44.3 41.9 46.2  LDL Calculated 53 40 63  VLDL Cholesterol 23 33 28  Cholesterol/HDL Ratio 2.7 2.7  3.0   Hemoglobin A1C Component 09/15/20  05/13/20 03/25/19   Hemoglobin A1C 6.6 High  6.9 High  6.6 High   Thyroid Stimulating Hormone (TSH) 3.290     ==================================================  COVID-19 Education: The signs and symptoms of COVID-19 were discussed with the patient and how to seek care for testing (follow up with PCP or arrange E-visit).    I spent a total of 21 minutes with the patient spent in direct patient consultation.  Additional time spent with chart review  / charting (studies, outside notes, etc): 14 min Total Time: 35 min  Current medicines are reviewed at length with the patient today.  (+/- concerns) none  This visit occurred during the SARS-CoV-2 public health emergency.  Safety protocols were in place, including screening questions prior to the visit, additional usage of staff PPE, and extensive cleaning of exam room while observing appropriate contact time as indicated for disinfecting solutions.  Notice: This dictation was prepared with Dragon dictation along with smart phrase technology. Any transcriptional errors that result from this process are unintentional and may not be corrected upon review.  Studies Ordered:   Orders Placed This Encounter  Procedures   EKG 12-Lead    Patient Instructions / Medication Changes & Studies & Tests Ordered   Patient Instructions  Medication Instructions:  Stopping Metoprolol  follow instructions below   For the next 2 weeks take 1/2 tablet of 25 mg  Metoprolol succinate  everyday  and then next 2 weeks take 1/2 tablet  of 25 mg every other day then stop medication all together.  *If you need a refill on your cardiac medications before your next appointment, please call your pharmacy*   Lab Work:  Not needed    Testing/Procedures:  Not needed   Follow-Up: At [facility], you and your health needs are our priority.  As part of our continuing mission to provide you with exceptional heart care, we have created designated Provider Care Teams.  These Care Teams include your primary Cardiologist (physician) and Advanced Practice Providers (APPs -  Physician Assistants and Nurse Practitioners) who all work together to provide you with the care you need, when you need it.  We recommend signing up for the patient portal called "MyChart".  Sign up information is provided on this After Visit Summary.  MyChart is used to connect with patients for Virtual Visits (Telemedicine).  Patients are able to view lab/test results, encounter notes, upcoming appointments, etc.  Non-urgent messages can be sent to your provider as well.   To learn more about what you can do with MyChart, go to [email or web address].    Your next appointment:   6 month(s)  The format for your next appointment:   In Person  Provider:   [name], MD     Other Instructions     [name], M.D., M.S. Interventional Cardiologist   Pager # [phone] Phone # [phone] [address] [place], [place] [address]   Thank you for choosing [facility] at [facility]!!

## 2021-01-15 ENCOUNTER — Encounter: Payer: Self-pay | Admitting: Cardiology

## 2021-01-15 NOTE — Assessment & Plan Note (Signed)
No longer hypertensive.  In fact he is actually hypotensive.  Weaning off Toprol.

## 2021-01-15 NOTE — Assessment & Plan Note (Signed)
History of one-vessel CAD with RCA PCI.  Pre-TAVR cath showed patent stent.  On aspirin and rosuvastatin.  ARB stopped because of hypotension, and we are now weaning off beta-blocker because of fatigue and hypotension.

## 2021-01-15 NOTE — Assessment & Plan Note (Signed)
Converted from simvastatin to rosuvastatin last visit.  Lipids just checked by PCP showed LDL of 52.  Had been 40 back in April.  Well-controlled.  Continue current dose of rosuvastatin.

## 2021-01-15 NOTE — Assessment & Plan Note (Signed)
Follow-up echocardiogram shows stable aortic valve with no residual stenosis or regurgitation. NYHA class I at worst symptoms because of fatigue. Will continue following up echoes for the next few years annually.

## 2021-01-15 NOTE — Assessment & Plan Note (Signed)
Distant history of RCA PCI with patent stent on pre-TAVR cath. After initially being on Plavix through January 2022, he was placed back on aspirin 81 mg.  Finally started to get some energy level.  Valve looks stable on echo.  Plan:  Still having fatigue, blood pressure is quite low.-We will wean off beta-blocker over the next 2 weeks to completely off.  No longer on ARB.  This was also discontinued  On stable dose of rosuvastatin.  Converted from Plavix to aspirin now.  Okay to hold 5 to 7 days preop for surgery or procedure.

## 2021-02-10 ENCOUNTER — Ambulatory Visit (INDEPENDENT_AMBULATORY_CARE_PROVIDER_SITE_OTHER): Payer: Medicare Other | Admitting: Podiatry

## 2021-02-10 ENCOUNTER — Other Ambulatory Visit: Payer: Self-pay

## 2021-02-10 ENCOUNTER — Encounter: Payer: Self-pay | Admitting: Podiatry

## 2021-02-10 DIAGNOSIS — M79676 Pain in unspecified toe(s): Secondary | ICD-10-CM

## 2021-02-10 DIAGNOSIS — B351 Tinea unguium: Secondary | ICD-10-CM | POA: Diagnosis not present

## 2021-02-10 DIAGNOSIS — D689 Coagulation defect, unspecified: Secondary | ICD-10-CM | POA: Diagnosis not present

## 2021-02-10 DIAGNOSIS — E1159 Type 2 diabetes mellitus with other circulatory complications: Secondary | ICD-10-CM

## 2021-02-10 NOTE — Progress Notes (Signed)
This patient returns to my office for at risk foot care.  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  Patient is taking plavix.    This patient is unable to cut nails himself since the patient cannot reach his nails.These nails are painful walking and wearing shoes.  This patient presents for at risk foot care today.  General Appearance  Alert, conversant and in no acute stress.  Vascular  Dorsalis pedis and posterior tibial  pulses are weakly palpable  bilaterally.  Capillary return is within normal limits  bilaterally. Cold feet  Bilaterally.  Absent digital hair  B/L.  Neurologic  Senn-Weinstein monofilament wire test within normal limits  bilaterally. Muscle power within normal limits bilaterally.  Nails Thick disfigured discolored nails with subungual debris  from hallux to fifth toes bilaterally. No evidence of bacterial infection or drainage bilaterally.  Orthopedic  No limitations of motion  feet .  No crepitus or effusions noted.  No bony pathology or digital deformities noted. Midfoot  DJD  B/L.  HAV  B/L  Tailors bunion  B/L.  Skin  normotropic skin with no porokeratosis noted bilaterally.  No signs of infections or ulcers noted.     Onychomycosis    B/L.      Mechanical debridement of nails 1-5  bilaterally performed with a nail nipper.  Filed with dremel without incident.   Return office visit   3 months                   Told patient to return for periodic foot care and evaluation due to potential at risk complications.   Shariya Gaster DPM  

## 2021-03-06 ENCOUNTER — Other Ambulatory Visit: Payer: Self-pay | Admitting: Cardiology

## 2021-03-26 ENCOUNTER — Other Ambulatory Visit: Payer: Self-pay | Admitting: Cardiology

## 2021-03-28 ENCOUNTER — Other Ambulatory Visit: Payer: Self-pay | Admitting: Cardiology

## 2021-04-04 ENCOUNTER — Ambulatory Visit: Admit: 2021-04-04 | Payer: Medicare Other | Admitting: Gastroenterology

## 2021-04-04 SURGERY — COLONOSCOPY WITH PROPOFOL
Anesthesia: General

## 2021-05-23 ENCOUNTER — Encounter: Payer: Self-pay | Admitting: Podiatry

## 2021-05-23 ENCOUNTER — Ambulatory Visit (INDEPENDENT_AMBULATORY_CARE_PROVIDER_SITE_OTHER): Payer: Medicare Other | Admitting: Podiatry

## 2021-05-23 DIAGNOSIS — M79676 Pain in unspecified toe(s): Secondary | ICD-10-CM

## 2021-05-23 DIAGNOSIS — E1159 Type 2 diabetes mellitus with other circulatory complications: Secondary | ICD-10-CM

## 2021-05-23 DIAGNOSIS — B351 Tinea unguium: Secondary | ICD-10-CM | POA: Diagnosis not present

## 2021-05-23 DIAGNOSIS — D689 Coagulation defect, unspecified: Secondary | ICD-10-CM

## 2021-05-23 NOTE — Progress Notes (Signed)
This patient returns to my office for at risk foot care.  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  Patient is taking plavix.    This patient is unable to cut nails himself since the patient cannot reach his nails.These nails are painful walking and wearing shoes.  This patient presents for at risk foot care today.  General Appearance  Alert, conversant and in no acute stress.  Vascular  Dorsalis pedis and posterior tibial  pulses are weakly palpable  bilaterally.  Capillary return is within normal limits  bilaterally. Cold feet  Bilaterally.  Absent digital hair  B/L.  Neurologic  Senn-Weinstein monofilament wire test within normal limits  bilaterally. Muscle power within normal limits bilaterally.  Nails Thick disfigured discolored nails with subungual debris  from hallux to fifth toes bilaterally. No evidence of bacterial infection or drainage bilaterally.  Orthopedic  No limitations of motion  feet .  No crepitus or effusions noted.  No bony pathology or digital deformities noted. Midfoot  DJD  B/L.  HAV  B/L  Tailors bunion  B/L.  Skin  normotropic skin with no porokeratosis noted bilaterally.  No signs of infections or ulcers noted.     Onychomycosis    B/L.      Mechanical debridement of nails 1-5  bilaterally performed with a nail nipper.  Filed with dremel without incident.   Return office visit   3 months                   Told patient to return for periodic foot care and evaluation due to potential at risk complications.   Kyel Purk DPM  

## 2021-06-14 DIAGNOSIS — I459 Conduction disorder, unspecified: Secondary | ICD-10-CM | POA: Insufficient documentation

## 2021-07-12 ENCOUNTER — Other Ambulatory Visit: Payer: Self-pay | Admitting: Cardiology

## 2021-08-18 ENCOUNTER — Ambulatory Visit (INDEPENDENT_AMBULATORY_CARE_PROVIDER_SITE_OTHER): Payer: Medicare Other | Admitting: Podiatry

## 2021-08-18 ENCOUNTER — Encounter: Payer: Self-pay | Admitting: Podiatry

## 2021-08-18 DIAGNOSIS — E1159 Type 2 diabetes mellitus with other circulatory complications: Secondary | ICD-10-CM

## 2021-08-18 DIAGNOSIS — M79676 Pain in unspecified toe(s): Secondary | ICD-10-CM | POA: Diagnosis not present

## 2021-08-18 DIAGNOSIS — D689 Coagulation defect, unspecified: Secondary | ICD-10-CM | POA: Diagnosis not present

## 2021-08-18 DIAGNOSIS — B351 Tinea unguium: Secondary | ICD-10-CM | POA: Diagnosis not present

## 2021-08-18 NOTE — Progress Notes (Signed)
This patient returns to my office for at risk foot care.  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  Patient is taking plavix.    This patient is unable to cut nails himself since the patient cannot reach his nails.These nails are painful walking and wearing shoes.  This patient presents for at risk foot care today.  General Appearance  Alert, conversant and in no acute stress.  Vascular  Dorsalis pedis and posterior tibial  pulses are weakly palpable  bilaterally.  Capillary return is within normal limits  bilaterally. Cold feet  Bilaterally.  Absent digital hair  B/L.  Neurologic  Senn-Weinstein monofilament wire test within normal limits  bilaterally. Muscle power within normal limits bilaterally.  Nails Thick disfigured discolored nails with subungual debris  from hallux to fifth toes bilaterally. No evidence of bacterial infection or drainage bilaterally.  Orthopedic  No limitations of motion  feet .  No crepitus or effusions noted.  No bony pathology or digital deformities noted. Midfoot  DJD  B/L.  HAV  B/L  Tailors bunion  B/L.  Skin  normotropic skin with no porokeratosis noted bilaterally.  No signs of infections or ulcers noted.     Onychomycosis    B/L.      Mechanical debridement of nails 1-5  bilaterally performed with a nail nipper.  Filed with dremel without incident.   Return office visit   3 months                   Told patient to return for periodic foot care and evaluation due to potential at risk complications.   Cletis Clack DPM  

## 2021-08-25 IMAGING — CT CT HEART MORP W/ CTA COR W/ SCORE W/ CA W/CM &/OR W/O CM
2 of 7 series · 11 of 20 positions shown, 13 images · IV contrast (APPLIED)
Comparison: None.
COMPARISON: None.

Addendum:
EXAM:
OVER-READ INTERPRETATION  CT CHEST

The following report is an over-read performed by radiologist Dr.
Yole Rosy [REDACTED] on 07/21/2019. This
over-read does not include interpretation of cardiac or coronary
anatomy or pathology. The coronary calcium score/coronary CTA
interpretation by the cardiologist is attached.
CLINICAL DATA: Severe Aortic Stenosis.
Cardiac TAVR CT
TECHNIQUE: The patient was scanned on a [employer] Force scanner. A 120 kV
retrospective scan was triggered in the descending thoracic aorta at
111 HU's. Gantry rotation speed was 250 msecs and collimation was .6
mm. No beta blockade or nitro were given. The 3D data set was
reconstructed in 5% intervals of the R-R cycle. Systolic and
diastolic phases were analyzed on a dedicated work station using
MPR, MIP and VRT modes. The patient received 80 cc of contrast.

[Series 8: 0-90% · axial · 0.39mm/px · z∈[-269,-180]mm · 5 of 2220 slices shown]
[im 370/2220  vessel]
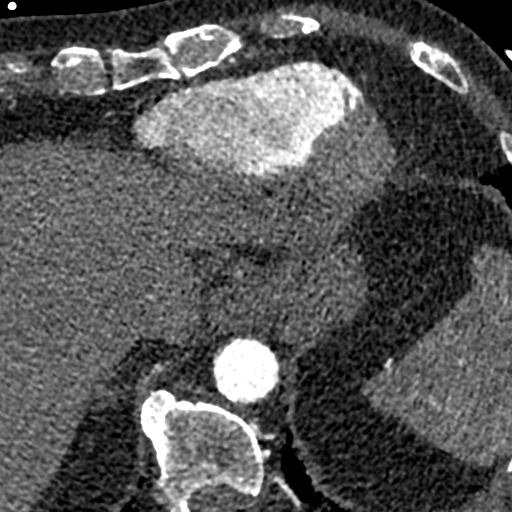
[im 740/2220  vessel]
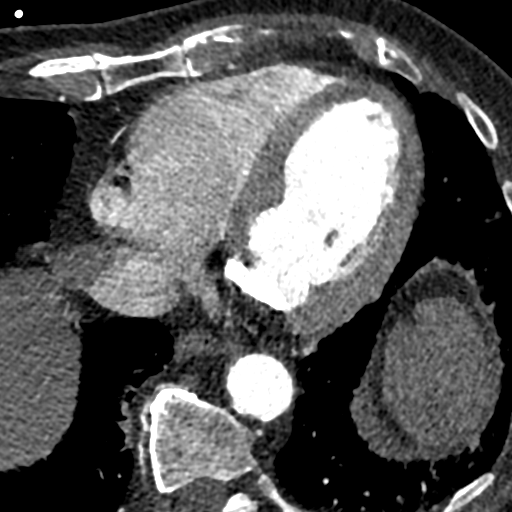
[im 1110/2220  vessel]
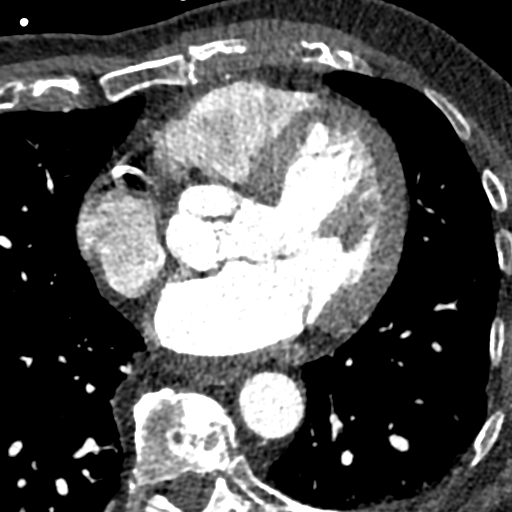
[im 1480/2220  vessel]
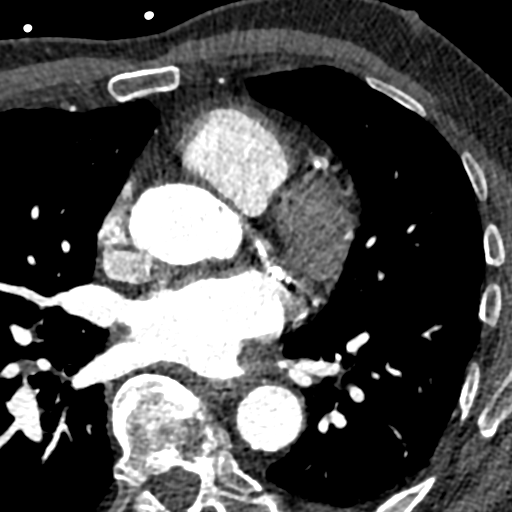
[im 1850/2220  vessel]
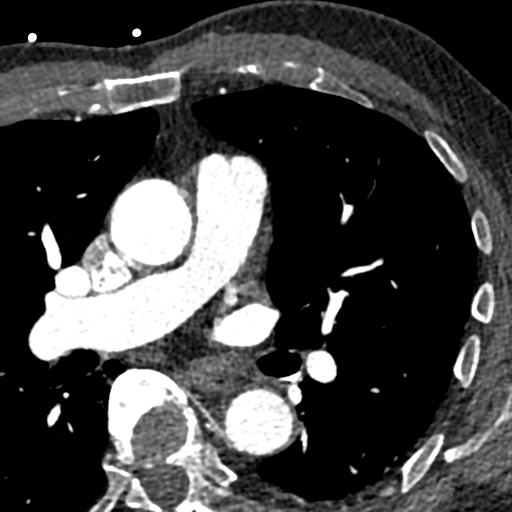

[Series 9: 5-95% · axial · 0.39mm/px · z∈[-272,-176]mm · 6 of 2220 slices shown, 8 images]
[im 318/2220  vessel]
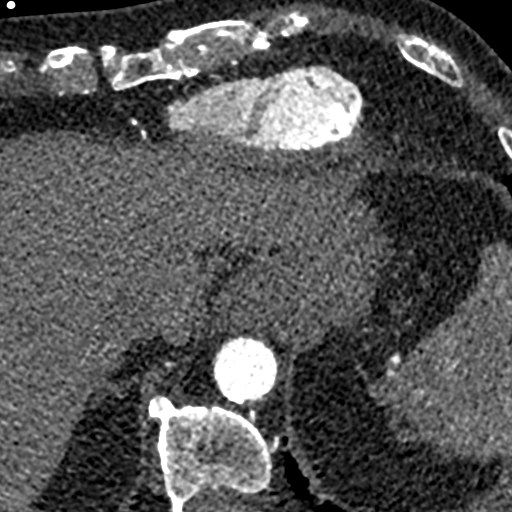
[im 318/2220  lung]
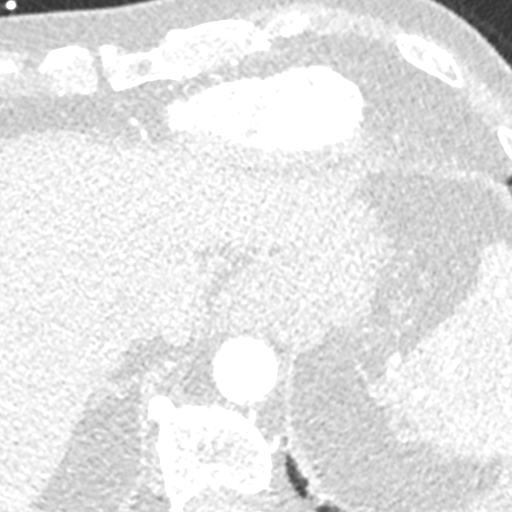
[im 635/2220  vessel]
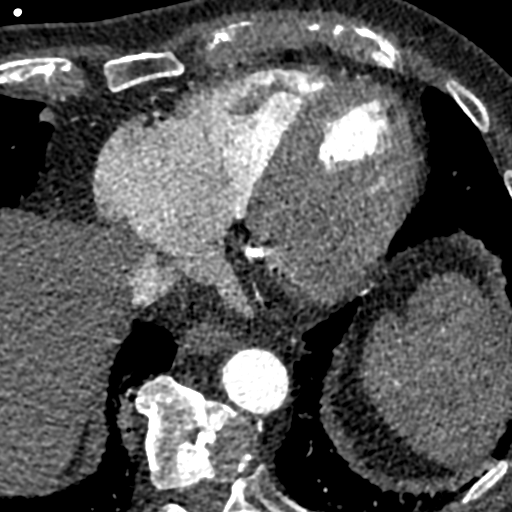
[im 952/2220  vessel]
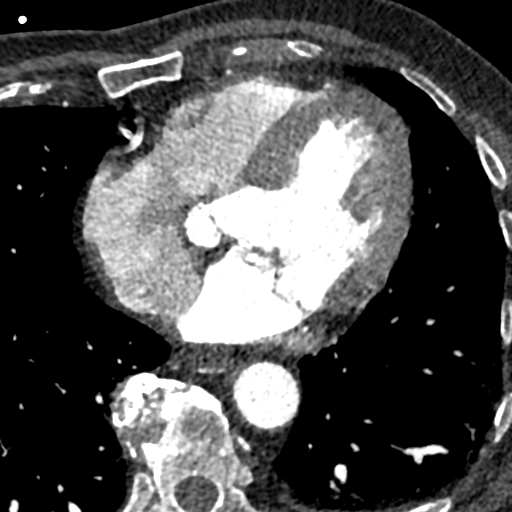
[im 1269/2220  vessel]
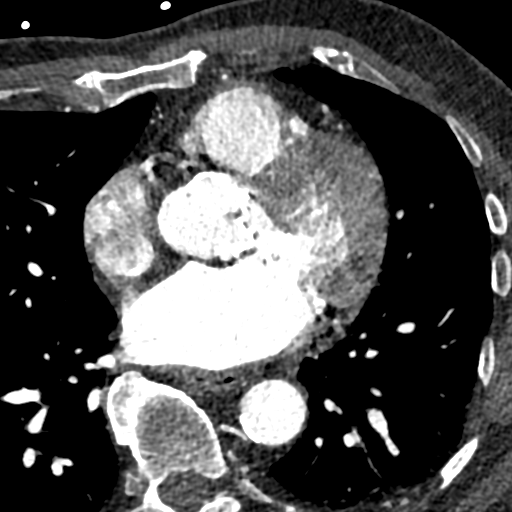
[im 1586/2220  vessel]
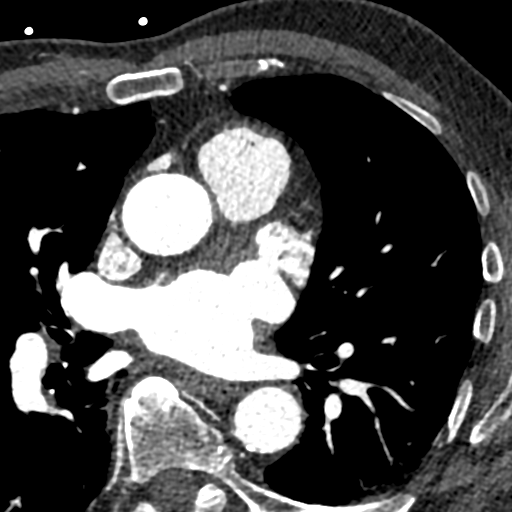
[im 1586/2220  lung]
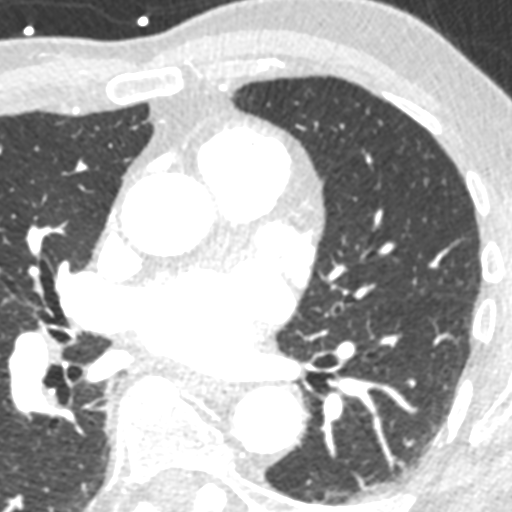
[im 1903/2220  vessel]
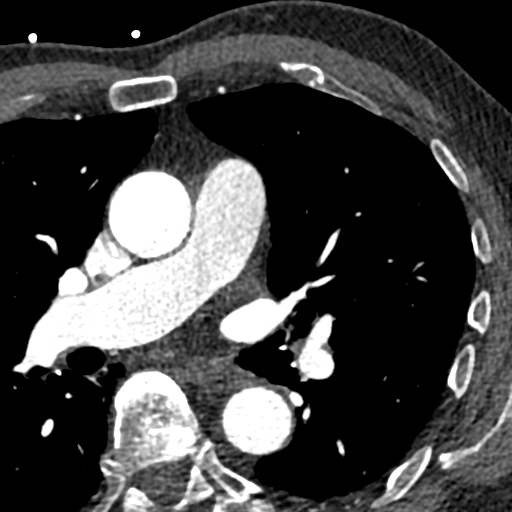

[11 of 20 positions shown; findings below may reference images not displayed]

FINDINGS: Aortic atherosclerosis. Within the visualized portions of the thorax
there are no suspicious appearing pulmonary nodules or masses, there
is no acute consolidative airspace disease, no pleural effusions, no
pneumothorax and no lymphadenopathy. Visualized portions of the
upper abdomen are unremarkable. There are no aggressive appearing
lytic or blastic lesions noted in the visualized portions of the
skeleton.
IMPRESSION: 1.  Aortic Atherosclerosis (U3RI8-4V5.5).
FINDINGS: Image quality: Excellent.

Noise artifact is: Limited.

Valve Morphology: The aortic valve is tricuspid. The NCC/LCC are
heavily calcified with bulky calcifications extending to the leaflet
bases. Leaflet motion is severely restricted in systole consistent
with severe aortic stenosis.

Aortic Valve Calcium score: 0753

Aortic annular dimension:

Phase assessed: 30%

Annular area: 542 mm2

Annular perimeter: 84.7 mm

Max diameter: 30.3 mm

Min diameter: 23.4 mm

Annular and subannular calcification: There is mild annular
calcification under the LCC extending into the LVOT and in
continuity with the anterior mitral valve annulus.

Optimal coplanar projection: LAO 11 WILTSE 3

Coronary Artery Height above Annulus:

Left Main: 20.4 mm

Right Coronary: 22.7 mm

Sinus of Valsalva Measurements:

Non-coronary: 35 mm

Right-coronary: 36 mm

Left-coronary: 36 mm

Sinus of Valsalva Height:

Non-coronary: 23.8 mm

Right-coronary: 26.7 mm

Left-coronary: 26.4 mm

Sinotubular Junction: 31 mm

Ascending Thoracic Aorta: 34 mm

Coronary Arteries: Normal coronary origin. Right dominance. 3-vessel
calcifications. The study was performed without use of NTG and is
insufficient for plaque evaluation. Please refer to recent cardiac
catheterization for coronary assessment.

Cardiac Morphology:

Right Atrium: Right atrial size is within normal limits.

Right Ventricle: The right ventricular cavity is within normal
limits.

Left Atrium: Left atrial size is normal in size with no left atrial
appendage filling defect.

Left Ventricle: The ventricular cavity size is within normal limits.
There are no stigmata of prior infarction. There is no abnormal
filling defect. Normal left ventricular function, LVEF 55%. No
regional wall motion abnormalities.

Pulmonary arteries: Normal in size without proximal filling defect.

Pulmonary veins: Normal pulmonary venous drainage.

Pericardium: Normal thickness with no significant effusion or
calcium present.

Mitral Valve: The mitral valve is degenerative with severe mitral
annular calcification.

Extra-cardiac findings: See attached radiology report for
non-cardiac structures.
IMPRESSION: 1. Annular measurements (542 mm2) appropriate for 26 mm [HOSPITAL].

2. There is mild annular calcification under the LCC extending into
the LVOT and in continuity with the anterior mitral valve annulus.

3. Sufficient coronary to annulus distance.

4. Optimal Fluoroscopic Angle for Delivery: LAO 11 WILTSE 3

5. Severe mitral annular calcification.

*** End of Addendum ***
EXAM:
OVER-READ INTERPRETATION  CT CHEST

The following report is an over-read performed by radiologist Dr.
Yole Rosy [REDACTED] on 07/21/2019. This
over-read does not include interpretation of cardiac or coronary
anatomy or pathology. The coronary calcium score/coronary CTA
interpretation by the cardiologist is attached.
FINDINGS: Aortic atherosclerosis. Within the visualized portions of the thorax
there are no suspicious appearing pulmonary nodules or masses, there
is no acute consolidative airspace disease, no pleural effusions, no
pneumothorax and no lymphadenopathy. Visualized portions of the
upper abdomen are unremarkable. There are no aggressive appearing
lytic or blastic lesions noted in the visualized portions of the
skeleton.
IMPRESSION: 1.  Aortic Atherosclerosis (U3RI8-4V5.5).

## 2021-09-26 ENCOUNTER — Ambulatory Visit: Payer: Medicare Other | Attending: Cardiology | Admitting: Cardiology

## 2021-09-26 ENCOUNTER — Encounter: Payer: Self-pay | Admitting: Cardiology

## 2021-09-26 VITALS — BP 110/64 | HR 67 | Ht 68.0 in | Wt 158.0 lb

## 2021-09-26 DIAGNOSIS — Z952 Presence of prosthetic heart valve: Secondary | ICD-10-CM | POA: Diagnosis not present

## 2021-09-26 DIAGNOSIS — I493 Ventricular premature depolarization: Secondary | ICD-10-CM | POA: Insufficient documentation

## 2021-09-26 DIAGNOSIS — E785 Hyperlipidemia, unspecified: Secondary | ICD-10-CM | POA: Insufficient documentation

## 2021-09-26 DIAGNOSIS — Z9861 Coronary angioplasty status: Secondary | ICD-10-CM | POA: Insufficient documentation

## 2021-09-26 DIAGNOSIS — I350 Nonrheumatic aortic (valve) stenosis: Secondary | ICD-10-CM | POA: Diagnosis present

## 2021-09-26 DIAGNOSIS — I2510 Atherosclerotic heart disease of native coronary artery without angina pectoris: Secondary | ICD-10-CM | POA: Diagnosis present

## 2021-09-26 DIAGNOSIS — I10 Essential (primary) hypertension: Secondary | ICD-10-CM | POA: Diagnosis not present

## 2021-09-26 DIAGNOSIS — E1169 Type 2 diabetes mellitus with other specified complication: Secondary | ICD-10-CM | POA: Diagnosis not present

## 2021-09-26 MED ORDER — ROSUVASTATIN CALCIUM 20 MG PO TABS: ORAL_TABLET | ORAL | 3 refills | Status: DC

## 2021-09-26 NOTE — Progress Notes (Signed)
Primary Care Provider: Sofie Hartigan, MD Cardiologist: [name], MD Electrophysiologist: None  Clinic Note: Chief Complaint  Patient presents with   Follow-up    Doing well.  No complaints   Coronary Artery Disease    No angina   Cardiac Valve Problem    Status post-TAVR.  Doing well.   ===================================  ASSESSMENT/PLAN   Problem List Items Addressed This Visit       Cardiology Problems   Severe calcific aortic valve stenosis (Chronic)    2 years out from TAVR.  Doing well. Due for follow-up echo      Relevant Medications   rosuvastatin (CRESTOR) 20 MG tablet   Other Relevant Orders   ECHOCARDIOGRAM COMPLETE   Essential hypertension (Chronic)    Not hypertensive.  No longer on medications.      Relevant Medications   rosuvastatin (CRESTOR) 20 MG tablet   Hyperlipidemia associated with type 2 diabetes mellitus (HCC) (Chronic)    Now on rosuvastatin plus Zetia.  He takes rosuvastatin 20 mg daily.  We will switch to Monday Wednesday Friday to allow for more energy level.  Labs followed by PCP.  A1c 6.7--diabetes management per PCP is on glipizide.  Would consider SGLT2 inhibitor.      Relevant Medications   rosuvastatin (CRESTOR) 20 MG tablet   Symptomatic PVCs; history of    No longer symptomatic.  Doing okay despite being off of Toprol.  He had PACs at PCP visit.  Not symptomatic.  No other arrhythmia symptoms.      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  Is now on aspirin along with statin/Zetia.  Not on beta-blocker because of bradycardia.  No longer hypertensive therefore not on ARB - was hypotensive, would not restart blood pressure medications.      Relevant Medications   rosuvastatin (CRESTOR) 20 MG tablet   CAD S/P percutaneous coronary  angioplasty --> PCI RCA Promus DES 2.5 mm x 23 mm (Chronic)    No longer on Plavix.  Is on aspirin monotherapy.  Okay to hold for procedures or surgeries.      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  Has been stable on echo.  NYHA class I symptoms.  Due for follow-up echocardiogram..  This can be done in [place] office. Can review results and follow-up visit.  Discussed importance of remembering need for SBE prophylaxis.      Relevant Orders   ECHOCARDIOGRAM COMPLETE   ===================================  HPI:    Victor L Barsamian-"Cotton" is a 88 y.o. male with a PMH below who presents today for 9 month f/u.  PMH notable for CAD, carotid disease, HTN, HLD and Critical AS-s/p TAVR (June 2021) who presents today for annual follow-up.   2008 CAD -> PCI RCA  Pre-TAVR Cath 05/2019: Patent RCA stent. pRCA ~55%, and 70% small D2. SEVERE AORTIC STENOSIS (progression to severe February 2021 -> mean gradient 42 mmHg.) ->  Became symptomatic early May 2021 with.  AVG up to 50 mmHg. TAVR procedure Date 07/29/2019: 29 mm Sapien prosthetic (TAVR) valve  Victor [name] was last seen on December 21, 2020 -  noted hypotension and bradycardia.  Beta-blocker was DC'd.  Had weaned rosuvastatin down to 20 mg.  Recent Hospitalizations: None  Has been seen by PCP with concerns for "irregular heartbeat ".  This was something noted on GI clinic evaluation.  Went for EKG showing PAC.  Total asymptomatic.  Reviewed  CV studies:    The following studies were reviewed today: (if available, images/films reviewed: From Epic Chart or Care Everywhere) None:   Interval History:   Victor [name] returns today overall doing well.  No major complaints.  Has not been exercising as much as usual because he has been going back and forth to the  hospital to visit his wife over the last 6 to 7 weeks.  She has been in the hospital off-and-on.  Had hip replacement and then had some complications afterwards.  Prior to that he was doing his routine exercise just not as much and as vigorous as before.  Still playing some golf but again not for the last 6 to 8 weeks.  Denies any chest pain pressure or dyspnea with exertion.  No PND, orthopnea edema.  No irregular heartbeats or palpitations that he senses.  No sensation of irregular rhythms.  No syncope/near syncope or TIA/amaurosis fugax.  No claudication.  REVIEWED OF SYSTEMS   Pertinent cardiac symptoms per HPI.  Otherwise negative Noncardiac symptoms: Some baseline fatigue, but improving-just notes not being as "spry as he used to ". Some limiting joint pains. Mild memory loss.  I have reviewed and (if needed) personally updated the patient's problem list, medications, allergies, past medical and surgical history, social and family history.   PAST MEDICAL HISTORY   Past Medical History:  Diagnosis Date   Angina    Arthritis    BPH (benign prostatic hypertrophy)    CAD S/P percutaneous coronary angioplasty 07/2006   PCI to RCA - Promus DES 2.5 mm x 23 mm; 2D ECHO - EF >55% --> Cath 05/2019 - ~50-60% pRCA with patent stent.  70% ost D1 (small).     Diabetes mellitus    Diverticulitis    s/p colectomy   GERD (gastroesophageal reflux disease)    H/O Severe aortic stenosis 05/2019   Pre-TAVR mean AVA 52.5 mmHg.  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   Squamous cell skin cancer, nasal tip     PAST SURGICAL HISTORY   Past Surgical  History:  Procedure Laterality Date   CARDIAC CATHETERIZATION with PCI  08/27/2006   RCA-mid - 2.5x84m Promus stent   CATARACT EXTRACTION W/ INTRAOCULAR LENS  IMPLANT, BILATERAL     CATARACT EXTRACTION W/PHACO Right 03/08/2015   Procedure: CATARACT EXTRACTION PHACO AND INTRAOCULAR LENS PLACEMENT (IOC);  Surgeon: SEstill Cotta MD;  Location: [facility] ORS;  Service: Ophthalmology;  Laterality: Right;  [place] 01:29.4    COLECTOMY  ~ 2000   LEFT HEART CATHETERIZATION WITH CORONARY ANGIOGRAM N/A 03/22/2011   Procedure: LEFT HEART CATHETERIZATION WITH CORONARY ANGIOGRAM;  Surgeon: DLeonie Man MD;  [facility];  Service: Cardiovascular::: Patent RCA stent w/  progression of pRCA Dz to ~50-60%. Progression of oD3 lesion to 60-70% - not optimal for PCI (b/c ostial).  EF 55-60% - no RWMA. Med Rx.  Aortic valve gradient: Peak 22 mmHg, mean 13 mmHg   NM MYOVIEW LTD  08/2014   LOW RISK. NORMAL.  EF 45-54%.    RIGHT/LEFT HEART CATH AND CORONARY ANGIOGRAPHY N/A 06/25/2019   Procedure: RIGHT/LEFT HEART CATH AND CORONARY ANGIOGRAPHY;  Surgeon: Leonie Man, MD;  Location: [place] CV LAB;  Severe AS (mean gradent 44.2 mmHg). Stable CAD - 50-60% pRCA with widely patent stent.  Small caliber D2 w/ ost 70%. Normal RHC Pressures.   TEE WITHOUT CARDIOVERSION N/A 07/29/2019   Procedure: TRANSESOPHAGEAL ECHOCARDIOGRAM (TEE);  Surgeon: Burnell Blanks, MD;  Location: [place] CV LAB;  Service: Open Heart Surgery;  Laterality: N/A;   TRANSCATHETER AORTIC VALVE REPLACEMENT, TRANSFEMORAL N/A 07/29/2019   Procedure: TRANSCATHETER AORTIC VALVE REPLACEMENT, TRANSFEMORAL;  Surgeon: Burnell Blanks, MD;  Location: [facility];; R TFA Approach -> Edwards Sapien 3 THV (size 29 mm, model # [id number], serial # [id number])   TRANSTHORACIC ECHOCARDIOGRAM  03/2019   a) 03/2019: calcified aortic valve-severe stenosis.  AVA 0.79 cm.  Mean gradient 42.3 mmHg.  EF 60 to 65%.;; b) 05/2019:  Severe/critical AS -V-max 4.72ms, mean gradient 52-53 mmHg, AVA 0.49 cm. EF 60-65%. No RWMA. Gr 1 DD?.Marland KitchenMod-Severe LA dilation   TRANSTHORACIC ECHOCARDIOGRAM  07/2019   a) 07/2019: POD #1 TAVR: EF 60 to 65%.  Moderate LA dilation.  Mild to moderate MR.  Well seated, nl fxning  TAVR valve.  Mean gradient 5 mmHg.;; b) 08/2019: EF 60 to 65%.  Normal RV function.  GR 1 DD.  No R WMA.  Moderate LA dilation.  Mild to moderate MR.  Mild to moderate TR.  Stable Edwards Sapien 29 mmTAVR valve in aortic position -normal function.  Mean gradient 8 mmHg.   TRANSTHORACIC ECHOCARDIOGRAM  08/18/2020   29 mm Sapien bioprosthetic TAVR well-positioned.  No PVL.  Mean gradient 9 mmHg-no residual AS.  Normal LV function with EF 55 to 60%.  No R WMA.  Mild concentric LVH.  GR 1 DD with mild LA and RA dilation.  Normal RV size and function.  Normal RVP/RAP.  Mild MV degeneration but no MR or MS.   TRANSURETHRAL RESECTION OF PROSTATE  ~ 2010     R&LHC 06/25/2019: Severe AS (mean gradent 44.2 mmHg). Stable CAD - 50-60% pRCA with widely patent stent.  Small caliber D1 w/ ost 70%. Normal RHC Pressures.      Immunization History  Administered Date(s) Administered   Influenza Split 12/01/2014   Influenza,inj,Quad PF,6+ Mos 11/28/2019   Influenza-Unspecified 10/26/2011, 11/08/2012, 11/27/2013, 12/01/2014, 11/12/2015, 10/25/2016   PFIZER Comirnaty(Gray Top)Covid-19 Tri-Sucrose Vaccine 05/09/2019, 05/30/2019   PFIZER(Purple Top)SARS-COV-2 Vaccination 05/09/2019, 05/30/2019   Pneumococcal Polysaccharide-23 01/31/1999   Tdap 09/06/2016    MEDICATIONS/ALLERGIES   Current Meds  Medication Sig   acetaminophen (TYLENOL) 500 MG tablet Take 500 mg by mouth every 6 (six) hours as needed for moderate pain or headache.   Artificial Tear Solution (GENTEAL TEARS OP) Place 1 drop into both eyes daily.   ascorbic acid (VITAMIN C) 500 MG tablet Take 1,000-1,500 mg by mouth See admin instructions. Take 1000 mg in the morning and 1500  mg at night   aspirin EC 81 MG tablet Take 81 mg by mouth daily.   benzonatate (TESSALON) 200 MG capsule Take 200 mg by mouth 3 (three) times  daily as needed for cough.   bismuth subsalicylate (PEPTO BISMOL) 262 MG/15ML suspension Take 30 mLs by mouth every 6 (six) hours as needed for indigestion or diarrhea or loose stools.   calcium carbonate (OS-CAL) 600 MG TABS tablet Take 600 mg by mouth daily.   cetirizine (ZYRTEC) 10 MG tablet Take 10 mg by mouth daily.   Cholecalciferol (VITAMIN D3) 2000 UNITS TABS Take 4,000 Units by mouth 2 (two) times daily.    CINNAMON PO Take 1,000 mg by mouth 2 (two) times daily.   clindamycin (CLEOCIN) 300 MG capsule Take 2 capsules (600 mg) 1 hour prior to all dental visits.   Coenzyme Q10 (COQ-10) 100 MG CAPS Take 100 mg by mouth daily.   Cyanocobalamin (B-12) 2500 MCG TABS Take 2,500 mcg by mouth daily.   donepezil (ARICEPT) 10 MG tablet Take 10 mg by mouth daily.   EPIPEN 2-PAK 0.3 MG/0.3ML SOAJ injection Inject 0.3 mLs as directed as needed for anaphylaxis.    ezetimibe (ZETIA) 10 MG tablet Take 10 mg by mouth daily.   fluticasone (FLONASE) 50 MCG/ACT nasal spray Place 2 sprays into both nostrils 2 (two) times daily as needed for allergies.    gabapentin (NEURONTIN) 100 MG capsule Take 300 mg by mouth at bedtime.    glipiZIDE (GLUCOTROL XL) 2.5 MG 24 hr tablet Take 2.5 mg by mouth daily with breakfast.   Glucosamine HCl 1000 MG TABS Take 1,000 mg by mouth 2 (two) times daily.   ipratropium (ATROVENT) 0.03 % nasal spray Place 2 sprays into both nostrils 2 (two) times daily as needed for rhinitis.   lansoprazole (PREVACID) 30 MG capsule Take by mouth.   loperamide (IMODIUM A-D) 2 MG tablet Take 2 mg by mouth daily as needed for diarrhea or loose stools.   methocarbamol (ROBAXIN) 500 MG tablet Take 500 mg by mouth at bedtime as needed for muscle spasms.   Multiple Vitamin (MULITIVITAMIN WITH MINERALS) TABS Take 1 tablet by mouth 2 (two) times daily.    Omega 3  1200 MG CAPS Take 2,400 mg by mouth 2 (two) times daily.    Probiotic CAPS Take 1 capsule by mouth daily.   pseudoephedrine (SUDAFED) 30 MG tablet Take 60 mg by mouth every 4 (four) hours as needed for congestion.   Saw Palmetto 450 MG CAPS Take 900 mg by mouth daily.    triamcinolone cream (KENALOG) 0.1 % Apply 1 application topically 3 (three) times daily as needed for itching.   zinc gluconate 50 MG tablet Take 50 mg by mouth daily.   [DISCONTINUED] rosuvastatin (CRESTOR) 20 MG tablet Take 1 tablet by mouth once daily    Allergies  Allergen Reactions   Azithromycin Rash and Anaphylaxis   Cephalosporins Anaphylaxis and Other (See Comments)   Codeine Anaphylaxis, Itching and Rash   Doxycycline Anaphylaxis   Phenylephrine-Guaifenesin     Unknown reaction    Pseudoephedrine Other (See Comments)    unknown    Amoxicillin Rash   Levofloxacin Rash   Penicillins Rash    Other reaction(s): Unknown   Sulfa Antibiotics Rash   Sulfasalazine Rash    SOCIAL HISTORY/FAMILY HISTORY   Reviewed in Epic:  Pertinent findings:  Social History   Tobacco Use   Smoking status: Never   Smokeless tobacco: Former    Types: Chew    Quit date: 01/31/1996  Vaping Use   Vaping Use: Never used  Substance Use Topics   Alcohol use: Yes    Alcohol/week: 1.0 standard drink of  alcohol    Types: 1 Glasses of wine per week    Comment: GLASS OF WINE A MONTH   Drug use: No   Social History   Social History Narrative   Father of 2, grandfather 67.   Exercise for almost 2 hours a day, doing least 20 minutes on the elliptical trainer. He does his to 4 days a week. He'll also does weights and stretching exercises.    OBJCTIVE -PE, EKG, labs   Wt Readings from Last 3 Encounters:  09/26/21 158 lb (71.7 kg)  12/21/20 162 lb 9.6 oz (73.8 kg)  08/05/20 161 lb 9.6 oz (73.3 kg)    Physical Exam: BP 110/64   Pulse 67   Ht '5\' 8"'$  (1.727 m)   Wt 158 lb (71.7 kg)   SpO2 96%   BMI 24.02 kg/m  Physical  Exam Vitals reviewed.  Constitutional:      General: He is not in acute distress.    Appearance: Normal appearance. He is normal weight. He is not toxic-appearing.     Comments: Stable weight.  Healthy-appearing.  Well-groomed.  HENT:     Head: Normocephalic and atraumatic.  Neck:     Vascular: No carotid bruit (Radiated aortic murmur).  Cardiovascular:     Rate and Rhythm: Normal rate and regular rhythm.     Pulses: Normal pulses.     Heart sounds: Murmur (1/6 SEM RUSB) heard.     No friction rub.  Pulmonary:     Effort: Pulmonary effort is normal. No respiratory distress.     Breath sounds: Normal breath sounds. No wheezing, rhonchi or rales.  Chest:     Chest wall: No tenderness.  Musculoskeletal:        General: No swelling. Normal range of motion.     Cervical back: Normal range of motion and neck supple.  Skin:    General: Skin is warm and dry.     Coloration: Skin is pale.  Neurological:     General: No focal deficit present.     Mental Status: He is alert and oriented to person, place, and time.     Gait: Gait normal.  Psychiatric:        Mood and Affect: Mood normal.        Behavior: Behavior normal.        Thought Content: Thought content normal.        Judgment: Judgment normal.     Adult ECG Report  Rate: 67 ;  Rhythm: normal sinus rhythm, sinus arrhythmia, and 1 AVB, left ax deviation.  Septal MI, age-indeterminate. ;   Narrative Interpretation: Stable  Recent Labs:    [place] Related to Lipid Panel w/calc LDL Component 05/19/21  11/25/20 05/13/20 10/09/19  03/25/19  09/03/18   Cholesterol, Total 149 120 115 137 149 125  Triglyceride 147 115 167 141 137 117  HDL (High Density Lipoprotein) Cholesterol 52.3 44.3 41.9 46.2 54.9 49.5  LDL Calculated 67 53 40 63    09/22/2021: A1c 6.7 06/14/2021: BMP NA 140, K5.0, CL 103, CO2 32.3, BUN 19, CR 1.0, CA 9.5, GFR 71; CBC W6.4, H/H14.5/44, PLT  146.  ================================================== I spent a total of 20 minutes with the patient spent in direct patient consultation.  Additional time spent with chart review  / charting (studies, outside notes, etc): 18 min Total Time: 38 min  Current medicines are reviewed at length with the patient today.  (+/- concerns) none  Notice: This dictation was  prepared with Dragon dictation along with Engineer, materials. Any transcriptional errors that result from this process are unintentional and may not be corrected upon review.  Studies Ordered:   Orders Placed This Encounter  Procedures   EKG 12-Lead   ECHOCARDIOGRAM COMPLETE   Meds ordered this encounter  Medications   rosuvastatin (CRESTOR) 20 MG tablet    Sig: Take 40 mg  ( 2 tablets) on Mondays, Wednesday, Fridays  and 20 mg ( 1 tablet ) Saturday,Sunday, Tuesday,Thursday    Dispense:  180 tablet    Refill:  3    Direction changed - discontinue previous  RX    Patient Instructions / Medication Changes & Studies & Tests Ordered   Patient Instructions  Medication Instructions:   Start taking  2 tablet of Rosuvastatin on Mondays,Wednesdays and Fridays  - the other days take 1 tablet - Sunday, Saturday, Tuesday,Thursday  *If you need a refill on your cardiac medications before your next appointment, please call your pharmacy*   Lab Work:  Not needed      Testing/Procedures:  In [place] has requested that you have an echocardiogram. Echocardiography is a painless test that uses sound waves to create images of your heart. It provides your doctor with information about the size and shape of your heart and how well your heart's chambers and valves are working. This procedure takes approximately one hour. There are no restrictions for this procedure.   Follow-Up: At [facility], you and your health needs are our priority.  As part of our continuing mission to provide you with exceptional  heart care, we have created designated Provider Care Teams.  These Care Teams include your primary Cardiologist (physician) and Advanced Practice Providers (APPs -  Physician Assistants and Nurse Practitioners) who all work together to provide you with the care you need, when you need it.  We recommend signing up for the patient portal called "MyChart".  Sign up information is provided on this After Visit Summary.  MyChart is used to connect with patients for Virtual Visits (Telemedicine).  Patients are able to view lab/test results, encounter notes, upcoming appointments, etc.  Non-urgent messages can be sent to your provider as well.   To learn more about what you can do with MyChart, go to [email or web address].    Your next appointment:   10 month(s) ( June or July 2024)   The format for your next appointment:   In Person  at [place] or [place]  Provider:   [name], MD    Other Instructions        Leonie Man, MD, MS [name], M.D., M.S. Interventional Cardiologist  [place]  Pager # [phone] Phone # [phone] [address] [place], [place] [address]   Thank you for choosing [facility] [place]!!

## 2021-09-26 NOTE — Patient Instructions (Addendum)
Medication Instructions:   Start taking  2 tablet of Rosuvastatin on Mondays,Wednesdays and Fridays  - the other days take 1 tablet - Sunday, Saturday, Tuesday,Thursday  *If you need a refill on your cardiac medications before your next appointment, please call your pharmacy*   Lab Work:  Not needed      Testing/Procedures:  In [place] has requested that you have an echocardiogram. Echocardiography is a painless test that uses sound waves to create images of your heart. It provides your doctor with information about the size and shape of your heart and how well your heart's chambers and valves are working. This procedure takes approximately one hour. There are no restrictions for this procedure.   Follow-Up: At [facility], you and your health needs are our priority.  As part of our continuing mission to provide you with exceptional heart care, we have created designated Provider Care Teams.  These Care Teams include your primary Cardiologist (physician) and Advanced Practice Providers (APPs -  Physician Assistants and Nurse Practitioners) who all work together to provide you with the care you need, when you need it.  We recommend signing up for the patient portal called "MyChart".  Sign up information is provided on this After Visit Summary.  MyChart is used to connect with patients for Virtual Visits (Telemedicine).  Patients are able to view lab/test results, encounter notes, upcoming appointments, etc.  Non-urgent messages can be sent to your provider as well.   To learn more about what you can do with MyChart, go to [email or web address].    Your next appointment:   10 month(s) ( June or July 2024)   The format for your next appointment:   In Person  at [place] or [place]  Provider:   [name], MD    Other Instructions

## 2021-10-03 ENCOUNTER — Encounter: Payer: Self-pay | Admitting: Cardiology

## 2021-10-03 NOTE — Assessment & Plan Note (Signed)
No longer on Plavix.  Is on aspirin monotherapy.  Okay to hold for procedures or surgeries.

## 2021-10-03 NOTE — Assessment & Plan Note (Addendum)
Now on rosuvastatin plus Zetia.  He takes rosuvastatin 20 mg daily.  We will switch to Monday Wednesday Friday to allow for more energy level.  Labs followed by PCP.  A1c 6.7--diabetes management per PCP is on glipizide.  Would consider SGLT2 inhibitor.

## 2021-10-03 NOTE — Assessment & Plan Note (Signed)
No longer symptomatic.  Doing okay despite being off of Toprol.  He had PACs at PCP visit.  Not symptomatic.  No other arrhythmia symptoms.

## 2021-10-03 NOTE — Assessment & Plan Note (Signed)
No active angina.  Is now on aspirin along with statin/Zetia.  Not on beta-blocker because of bradycardia.  No longer hypertensive therefore not on ARB - was hypotensive, would not restart blood pressure medications.

## 2021-10-03 NOTE — Assessment & Plan Note (Signed)
Doing well.  Has been stable on echo.  NYHA class I symptoms.  Due for follow-up echocardiogram..  This can be done in [place] office. Can review results and follow-up visit.  Discussed importance of remembering need for SBE prophylaxis.

## 2021-10-03 NOTE — Assessment & Plan Note (Signed)
Not hypertensive.  No longer on medications.

## 2021-10-03 NOTE — Assessment & Plan Note (Signed)
2 years out from TAVR.  Doing well. Due for follow-up echo

## 2021-10-06 ENCOUNTER — Ambulatory Visit: Payer: Medicare Other

## 2021-11-17 ENCOUNTER — Ambulatory Visit: Payer: Medicare Other | Attending: Cardiology

## 2021-11-17 DIAGNOSIS — Z952 Presence of prosthetic heart valve: Secondary | ICD-10-CM | POA: Diagnosis present

## 2021-11-17 DIAGNOSIS — I350 Nonrheumatic aortic (valve) stenosis: Secondary | ICD-10-CM | POA: Insufficient documentation

## 2021-11-17 LAB — ECHOCARDIOGRAM COMPLETE
AR max vel: 2.33 cm2
AV Area VTI: 2.73 cm2
AV Area mean vel: 2.3 cm2
AV Mean grad: 8 mmHg
AV Peak grad: 15.9 mmHg
AV Vena cont: 0.3 cm
Ao pk vel: 1.99 m/s
Area-P 1/2: 2.99 cm2
Calc EF: 54 %
S' Lateral: 3 cm
Single Plane A2C EF: 55.4 %
Single Plane A4C EF: 53.1 %

## 2021-11-24 ENCOUNTER — Encounter: Payer: Self-pay | Admitting: Podiatry

## 2021-11-24 ENCOUNTER — Ambulatory Visit (INDEPENDENT_AMBULATORY_CARE_PROVIDER_SITE_OTHER): Payer: Medicare Other | Admitting: Podiatry

## 2021-11-24 DIAGNOSIS — B351 Tinea unguium: Secondary | ICD-10-CM

## 2021-11-24 DIAGNOSIS — E1159 Type 2 diabetes mellitus with other circulatory complications: Secondary | ICD-10-CM

## 2021-11-24 DIAGNOSIS — M79676 Pain in unspecified toe(s): Secondary | ICD-10-CM | POA: Diagnosis not present

## 2021-11-24 DIAGNOSIS — D689 Coagulation defect, unspecified: Secondary | ICD-10-CM | POA: Diagnosis not present

## 2021-11-24 NOTE — Progress Notes (Signed)
This patient returns to my office for at risk foot care.  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  Patient is taking plavix.    This patient is unable to cut nails himself since the patient cannot reach his nails.These nails are painful walking and wearing shoes.  This patient presents for at risk foot care today.  General Appearance  Alert, conversant and in no acute stress.  Vascular  Dorsalis pedis and posterior tibial  pulses are weakly palpable  bilaterally.  Capillary return is within normal limits  bilaterally. Cold feet  Bilaterally.  Absent digital hair  B/L.  Neurologic  Senn-Weinstein monofilament wire test within normal limits  bilaterally. Muscle power within normal limits bilaterally.  Nails Thick disfigured discolored nails with subungual debris  from hallux to fifth toes bilaterally. No evidence of bacterial infection or drainage bilaterally.  Orthopedic  No limitations of motion  feet .  No crepitus or effusions noted.  No bony pathology or digital deformities noted. Midfoot  DJD  B/L.  HAV  B/L  Tailors bunion  B/L.  Skin  normotropic skin with no porokeratosis noted bilaterally.  No signs of infections or ulcers noted.     Onychomycosis    B/L.      Mechanical debridement of nails 1-5  bilaterally performed with a nail nipper.  Filed with dremel without incident.   Return office visit   3 months                   Told patient to return for periodic foot care and evaluation due to potential at risk complications.   Gardiner Barefoot DPM

## 2022-02-16 ENCOUNTER — Ambulatory Visit (INDEPENDENT_AMBULATORY_CARE_PROVIDER_SITE_OTHER): Payer: Medicare Other | Admitting: Podiatry

## 2022-02-16 ENCOUNTER — Encounter: Payer: Self-pay | Admitting: Podiatry

## 2022-02-16 VITALS — BP 137/74 | HR 76

## 2022-02-16 DIAGNOSIS — B351 Tinea unguium: Secondary | ICD-10-CM

## 2022-02-16 DIAGNOSIS — D689 Coagulation defect, unspecified: Secondary | ICD-10-CM

## 2022-02-16 DIAGNOSIS — E1159 Type 2 diabetes mellitus with other circulatory complications: Secondary | ICD-10-CM

## 2022-02-16 DIAGNOSIS — M79676 Pain in unspecified toe(s): Secondary | ICD-10-CM | POA: Diagnosis not present

## 2022-02-16 NOTE — Progress Notes (Signed)
This patient returns to my office for at risk foot care.  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  Patient is taking plavix.    This patient is unable to cut nails himself since the patient cannot reach his nails.These nails are painful walking and wearing shoes.  This patient presents for at risk foot care today.  General Appearance  Alert, conversant and in no acute stress.  Vascular  Dorsalis pedis and posterior tibial  pulses are weakly palpable  bilaterally.  Capillary return is within normal limits  bilaterally. Cold feet  Bilaterally.  Absent digital hair  B/L.  Neurologic  Senn-Weinstein monofilament wire test within normal limits  bilaterally. Muscle power within normal limits bilaterally.  Nails Thick disfigured discolored nails with subungual debris  from hallux to fifth toes bilaterally. No evidence of bacterial infection or drainage bilaterally.  Orthopedic  No limitations of motion  feet .  No crepitus or effusions noted.  No bony pathology or digital deformities noted. Midfoot  DJD  B/L.  HAV  B/L  Tailors bunion  B/L.  Skin  normotropic skin with no porokeratosis noted bilaterally.  No signs of infections or ulcers noted.     Onychomycosis    B/L.     debridement of nails 1-5  bilaterally performed with a nail nipper.  Filed with dremel without incident.   Return office visit   3 months                   Told patient to return for periodic foot care and evaluation due to potential at risk complications.   Gardiner Barefoot DPM

## 2022-02-27 ENCOUNTER — Ambulatory Visit: Payer: Medicare Other | Admitting: Podiatry

## 2022-05-18 ENCOUNTER — Ambulatory Visit (INDEPENDENT_AMBULATORY_CARE_PROVIDER_SITE_OTHER): Payer: Medicare Other | Admitting: Podiatry

## 2022-05-18 ENCOUNTER — Encounter: Payer: Self-pay | Admitting: Podiatry

## 2022-05-18 VITALS — BP 114/57 | HR 67

## 2022-05-18 DIAGNOSIS — M79676 Pain in unspecified toe(s): Secondary | ICD-10-CM

## 2022-05-18 DIAGNOSIS — B351 Tinea unguium: Secondary | ICD-10-CM

## 2022-05-18 DIAGNOSIS — E1159 Type 2 diabetes mellitus with other circulatory complications: Secondary | ICD-10-CM

## 2022-05-18 NOTE — Progress Notes (Signed)
This patient returns to my office for at risk foot care.  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  Patient is taking plavix.    This patient is unable to cut nails himself since the patient cannot reach his nails.These nails are painful walking and wearing shoes.  This patient presents for at risk foot care today.  General Appearance  Alert, conversant and in no acute stress.  Vascular  Dorsalis pedis and posterior tibial  pulses are weakly palpable  bilaterally.  Capillary return is within normal limits  bilaterally. Cold feet  Bilaterally.  Absent digital hair  B/L.  Neurologic  Senn-Weinstein monofilament wire test within normal limits  bilaterally. Muscle power within normal limits bilaterally.  Nails Thick disfigured discolored nails with subungual debris  from hallux to fifth toes bilaterally. No evidence of bacterial infection or drainage bilaterally.  Orthopedic  No limitations of motion  feet .  No crepitus or effusions noted.  No bony pathology or digital deformities noted. Midfoot  DJD  B/L.  HAV  B/L  Tailors bunion  B/L.  Skin  normotropic skin with no porokeratosis noted bilaterally.  No signs of infections or ulcers noted.     Onychomycosis    B/L.     debridement of nails 1-5  bilaterally performed with a nail nipper.  Filed with dremel without incident.   Return office visit   3 months                   Told patient to return for periodic foot care and evaluation due to potential at risk complications.   Adara Kittle DPM  

## 2022-07-25 ENCOUNTER — Emergency Department: Payer: Medicare Other

## 2022-07-25 ENCOUNTER — Other Ambulatory Visit: Payer: Self-pay

## 2022-07-25 ENCOUNTER — Emergency Department
Admission: EM | Admit: 2022-07-25 | Discharge: 2022-07-25 | Disposition: A | Discharge status: Home / Self Care | Payer: Medicare Other | Attending: Emergency Medicine | Admitting: Emergency Medicine

## 2022-07-25 DIAGNOSIS — W19XXXA Unspecified fall, initial encounter: Secondary | ICD-10-CM

## 2022-07-25 DIAGNOSIS — M25551 Pain in right hip: Secondary | ICD-10-CM | POA: Diagnosis not present

## 2022-07-25 DIAGNOSIS — S8000XA Contusion of unspecified knee, initial encounter: Secondary | ICD-10-CM

## 2022-07-25 DIAGNOSIS — Y92019 Unspecified place in single-family (private) house as the place of occurrence of the external cause: Secondary | ICD-10-CM | POA: Insufficient documentation

## 2022-07-25 DIAGNOSIS — T148XXA Other injury of unspecified body region, initial encounter: Secondary | ICD-10-CM

## 2022-07-25 DIAGNOSIS — S8001XA Contusion of right knee, initial encounter: Secondary | ICD-10-CM | POA: Diagnosis not present

## 2022-07-25 DIAGNOSIS — S8002XA Contusion of left knee, initial encounter: Secondary | ICD-10-CM | POA: Diagnosis not present

## 2022-07-25 DIAGNOSIS — S8992XA Unspecified injury of left lower leg, initial encounter: Secondary | ICD-10-CM | POA: Diagnosis present

## 2022-07-25 NOTE — ED Provider Notes (Signed)
   [facility] Provider Note    Event Date/Time   First MD Initiated Contact with Patient 07/25/22 1223     (approximate)   History   Fall   HPI  Victor [name] is a 89 y.o. male who presents after a fall.  Patient lost his balance and fell forward onto his knees.  Complains of bilateral knee pain and mild right hip pain.  No other complaints     Physical Exam   Triage Vital Signs: ED Triage Vitals [07/25/22 1135]  Enc Vitals Group     BP 114/67     Pulse Rate 71     Resp 20     Temp 98.3 F (36.8 C)     Temp Source Oral     SpO2 94 %     Weight      Height      Head Circumference      Peak Flow      Pain Score 5     Pain Loc      Pain Edu?      Excl. in GC?     Most recent vital signs: Vitals:   07/25/22 1235 07/25/22 1329  BP: 124/73   Pulse: 66 63  Resp: 18 17  Temp:    SpO2: 94% 94%     General: Awake, no distress.  CV:  Good peripheral perfusion.  Resp:  Normal effort.  Abd:  No distention.  Other:  Abrasion to the left knee, shallow, good range of motion of both knees bilaterally no bony normalities palpated, is able to bear weight on both knees.  Full range of motion of both hips as well.  No vertebral tenderness palpation   ED Results / Procedures / Treatments   Labs (all labs ordered are listed, but only abnormal results are displayed) Labs Reviewed - No data to display   EKG     RADIOLOGY Hip x-ray viewed interpret by me, no fracture, confirmed by radiology    PROCEDURES:  Critical Care performed:   Procedures   MEDICATIONS ORDERED IN ED: Medications - No data to display   IMPRESSION / MDM / ASSESSMENT AND PLAN / ED COURSE  I reviewed the triage vital signs and the nursing notes. Patient's presentation is most consistent with acute complicated illness / injury requiring diagnostic workup.  Patient presents for mechanical fall as detailed above, overall well-appearing and in no acute distress  exam is overall reassuring, hip x-ray is negative for fracture, pending knee x-rays.    Clinical Course as of 07/25/22 1347  Tue Jul 25, 2022  1339 DG Knee Complete 4 Views Left [RK]    Clinical Course User Index [RK] Jene Every, MD   X-rays are negative for fractures, patient appropriate for discharge at this time  FINAL CLINICAL IMPRESSION(S) / ED DIAGNOSES   Final diagnoses:  Fall, initial encounter  Abrasion  Contusion of knee, unspecified laterality, initial encounter     Rx / DC Orders   ED Discharge Orders     None        Note:  This document was prepared using Dragon voice recognition software and may include unintentional dictation errors.   Jene Every, MD 07/25/22 [id number]

## 2022-07-25 NOTE — ED Triage Notes (Addendum)
Pt presents to ED today with c/o of mechanical fall after dog tripped him. Pt states he fell onto bilateral knees and pt /co of R hip pain. Pt denies LOC, pt denies hitting head. NAD noted. Pt is A&Ox4. Pt has minimal swelling to bilateral knees, abrasion to L knee noted, no bleeding noted at this time.

## 2022-07-25 NOTE — ED Notes (Signed)
Pt assisted to restroom using wheelchair.

## 2022-07-25 NOTE — ED Notes (Signed)
Pt to ED via [facility] from home for fall. Pt c/o Bilateral knee pain.

## 2022-07-25 NOTE — ED Notes (Signed)
BG 164 currently.

## 2022-07-25 NOTE — ED Notes (Signed)
Pt in with bilateral knee pain from fall; R hip pain is very mild currently; R knee pain is worse than L knee pain per pt. Pt A&Ox4. Visitor at bedside. Pt in NAD.

## 2022-07-25 NOTE — ED Notes (Signed)
Pt in NAD; visitor remains at bedside.  

## 2022-07-25 NOTE — ED Notes (Signed)
Attempting to check pt's BG at his request since hasn't eaten in hours and has DM2. However, system down and glucometer will not allow for override currently. Will keep attempting and call charge for more assistance.

## 2022-07-25 NOTE — ED Notes (Signed)
Registration at bedside.

## 2022-07-26 LAB — CBG MONITORING, ED: Glucose-Capillary: 164 mg/dL — ABNORMAL HIGH (ref 70–99)

## 2022-08-21 ENCOUNTER — Encounter: Payer: Self-pay | Admitting: Podiatry

## 2022-08-21 ENCOUNTER — Ambulatory Visit (INDEPENDENT_AMBULATORY_CARE_PROVIDER_SITE_OTHER): Payer: Medicare Other | Admitting: Podiatry

## 2022-08-21 VITALS — BP 121/64

## 2022-08-21 DIAGNOSIS — D689 Coagulation defect, unspecified: Secondary | ICD-10-CM | POA: Diagnosis not present

## 2022-08-21 DIAGNOSIS — E1159 Type 2 diabetes mellitus with other circulatory complications: Secondary | ICD-10-CM

## 2022-08-21 DIAGNOSIS — M79676 Pain in unspecified toe(s): Secondary | ICD-10-CM

## 2022-08-21 DIAGNOSIS — B351 Tinea unguium: Secondary | ICD-10-CM

## 2022-08-21 NOTE — Progress Notes (Signed)
  Subjective:  Patient ID: Victor [name], male    DOB: 23-Jan-1932,  MRN: [id number]  Victor [name] presents to clinic today for at risk foot care. Pt has h/o NIDDM with PAD, with h/o coagulation defect, for painful thick toenails that are difficult to trim. Pain interferes with ambulation. Aggravating factors include wearing enclosed shoe gear. Pain is relieved with periodic professional debridement.  Chief Complaint  Patient presents with   Nail Problem    DFC,Referring Provider Victor Goodell, MD,lov:07/24      New problem(s): None.   PCP is [name], Victor Guthrie, MD.  Allergies  Allergen Reactions   Azithromycin Rash and Anaphylaxis   Cephalosporins Anaphylaxis and Other (See Comments)   Codeine Anaphylaxis, Itching and Rash   Doxycycline Anaphylaxis   Phenylephrine-Guaifenesin     Unknown reaction    Pseudoephedrine Other (See Comments)    unknown    Amoxicillin Rash   Levofloxacin Rash   Penicillins Rash    Other reaction(s): Unknown   Sulfa Antibiotics Rash   Sulfasalazine Rash    Review of Systems: Negative except as noted in the HPI.  Objective: No changes noted in today's physical examination. Vitals:   08/21/22 0945  BP: 121/64   Victor [name] is a pleasant 89 y.o. male WD, WN in NAD. AAO x 3.  Vascular Examination: CFT <3 seconds b/l. DP/PT pulses faintly palpable b/l. Skin temperature gradient warm to warm b/l. No pain with calf compression. No ischemia or gangrene. No cyanosis or clubbing noted b/l.    Neurological Examination: Sensation grossly intact b/l with 10 gram monofilament.  Dermatological Examination: Pedal skin warm and supple b/l.   No open wounds. No interdigital macerations.  Toenails 1-5 b/l thick, discolored, elongated with subungual debris and pain on dorsal palpation.    No corns, calluses nor porokeratotic lesions noted.  Musculoskeletal Examination: Muscle strength 5/5 to b/l LE. HAV with bunion deformity noted b/l LE.  Tailor's bunion deformity noted b/l LE.  Radiographs: None  Assessment/Plan: 1. Pain due to onychomycosis of toenail   2. Coagulation defect (HCC)   3. Type 2 diabetes mellitus with vascular disease (HCC)   Patient was evaluated and treated. All patient's and/or POA's questions/concerns addressed on today's visit. Toenails 1-5 debrided in length and girth without incident. Continue soft, supportive shoe gear daily. Report any pedal injuries to medical professional. Call office if there are any questions/concerns. -Patient/POA to call should there be question/concern in the interim.   Return in about 4 months (around 12/22/2022).  Freddie Breech, DPM

## 2022-10-03 ENCOUNTER — Ambulatory Visit: Payer: Medicare Other | Admitting: Podiatry

## 2022-11-06 ENCOUNTER — Other Ambulatory Visit: Payer: Self-pay | Admitting: Cardiology

## 2022-11-09 ENCOUNTER — Other Ambulatory Visit: Payer: Self-pay | Admitting: Cardiology

## 2022-11-24 ENCOUNTER — Ambulatory Visit: Payer: Medicare Other | Attending: Cardiology | Admitting: Cardiology

## 2022-11-24 ENCOUNTER — Encounter: Payer: Self-pay | Admitting: Cardiology

## 2022-11-24 VITALS — BP 120/64 | HR 67 | Ht 68.0 in | Wt 158.0 lb

## 2022-11-24 DIAGNOSIS — Z952 Presence of prosthetic heart valve: Secondary | ICD-10-CM | POA: Insufficient documentation

## 2022-11-24 DIAGNOSIS — I2510 Atherosclerotic heart disease of native coronary artery without angina pectoris: Secondary | ICD-10-CM | POA: Diagnosis present

## 2022-11-24 DIAGNOSIS — R5382 Chronic fatigue, unspecified: Secondary | ICD-10-CM | POA: Diagnosis present

## 2022-11-24 DIAGNOSIS — I10 Essential (primary) hypertension: Secondary | ICD-10-CM | POA: Diagnosis present

## 2022-11-24 DIAGNOSIS — E785 Hyperlipidemia, unspecified: Secondary | ICD-10-CM | POA: Insufficient documentation

## 2022-11-24 DIAGNOSIS — I493 Ventricular premature depolarization: Secondary | ICD-10-CM | POA: Insufficient documentation

## 2022-11-24 DIAGNOSIS — E1169 Type 2 diabetes mellitus with other specified complication: Secondary | ICD-10-CM | POA: Diagnosis present

## 2022-11-24 DIAGNOSIS — R413 Other amnesia: Secondary | ICD-10-CM | POA: Diagnosis present

## 2022-11-24 DIAGNOSIS — Z9861 Coronary angioplasty status: Secondary | ICD-10-CM | POA: Diagnosis present

## 2022-11-24 NOTE — Patient Instructions (Signed)
Other Instructions   Keep going to the Gym for exercise, if you don't go the Gym , go for a walk.   Medication Instructions:   No changes  *If you need a refill on your cardiac medications before your next appointment, please call your pharmacy*   Lab Work:  Not needed     Testing/Procedures:  Will be schedule in [facility]  or [facility] Your physician has requested that you have an echocardiogram. Echocardiography is a painless test that uses sound waves to create images of your heart. It provides your doctor with information about the size and shape of your heart and how well your heart's chambers and valves are working. This procedure takes approximately one hour. There are no restrictions for this procedure. Please do NOT wear cologne, perfume, aftershave, or lotions (deodorant is allowed). Please arrive 15 minutes prior to your appointment time.    Follow-Up: At [facility], you and your health needs are our priority.  As part of our continuing mission to provide you with exceptional heart care, we have created designated Provider Care Teams.  These Care Teams include your primary Cardiologist (physician) and Advanced Practice Providers (APPs -  Physician Assistants and Nurse Practitioners) who all work together to provide you with the care you need, when you need it.  We recommend signing up for the patient portal called "MyChart".  Sign up information is provided on this After Visit Summary.  MyChart is used to connect with patients for Virtual Visits (Telemedicine).  Patients are able to view lab/test results, encounter notes, upcoming appointments, etc.  Non-urgent messages can be sent to your provider as well.   To learn more about what you can do with MyChart, go to [email or web address].    Your next appointment:   1 month(s)  The format for your next appointment:   Virtual Visit   Provider:   Bryan Lemma, MD    Other Instructions   Keep going to the  Gym for exercise, if you don't go the Gym , go for a walk.

## 2022-11-24 NOTE — Progress Notes (Unsigned)
Cardiology Office Note:  .   Date:  11/25/2022  ID:  Victor [name] [birth year], MRN [id number] PCP: Marina Goodell, MD  [facility] Providers Cardiologist:  Bryan Lemma, MD     Chief Complaint  Patient presents with   Follow-up    Wife notes more than usual fatigue   Coronary Artery Disease    No angina.  Still works out routinely.   Cardiac Valve Problem    Status post TAVR.  Due for upcoming echo.    Patient Profile: .     Victor [name] is a 89 y.o. male with a PMH notable CAD-PCI, h/o Critical AS-s/p TAVR, HTN, & HLD who presents here for annual follow-up.  He continues to return at the request of Marina Goodell, MD.  PMH notable for CAD, carotid disease, HTN, HLD and Critical AS-s/p TAVR (June 2021) who presents today for annual follow-up.  2008 CAD -> PCI RCA  Pre-TAVR Cath 05/2019: Patent RCA stent. pRCA ~55%, and 70% small D2 SEVERE AORTIC STENOSIS (progression to severe February 2021 -> mean gradient 42 mmHg.) ->  Became symptomatic early May 2021 with.  AVG up to 50 mmHg. TAVR procedure Date 07/29/2019: 29 mm Sapien prosthetic (TAVR) valve    Manjinder L Blish was last seen on 09/26/2021: Noted that he had recently sent his PCP with concerns of irregular heartbeat.  EKG showed PACs but he was asymptomatic.  When I saw him he is doing well not exercising as much as usual because he is having Cornelius Moras for the hospital to consider his wife who was in hospital for hip replacement.  But he but no rigorous exercise bleeding after that.  Still playing golf up until his last hospitalization as well.  No chest pain pressure significant exertional dyspnea.  He states negative cardiac ROS.  Notes some baseline fatigue just not" (to be.  Limited by joint pains.  Some mild memory loss.  Subjective  Discussed the use of AI scribe software for clinical note transcription with the patient, who gave verbal consent to proceed.  History of Present Illness   The patient, an  89 year old with a history of aortic stenosis treated with a Transcatheter Aortic Valve Replacement (TAVR) in 2021, presented with a chief complaint of decreased energy levels. The patient reported feeling fatigued and lacking the energy to perform daily activities, although he continues to exercise three times a week, performing a full-body workout including chest press curls, back, and leg exercises. On non-exercise days, the patient reported being able to go for 10-15-minute walks.  In June, the patient experienced a fall on the back steps, landing on a concrete slab and injuring his knee and hip. The fall was attributed to entanglement with a dog leash, rather than a loss of balance or dizziness. No fractures were reported from this incident.  The patient's cardiac history includes a stent placement in 2008, which remained open as of the last heart catheterization in May 2021. At that time, moderate blockages were noted in some side branches and upstream of the stent. The patient denied experiencing any chest pain, pressure, or shortness of breath, either at rest or during exercise. There were no reports of swelling in the legs, blood in stools or urine, nosebleeds, or waking up short of breath.  The patient's diabetes is managed with glipizide, and he also takes gabapentin for neuropathy. He reported no anemia, with a hemoglobin level of 15.2. The patient's cholesterol levels were reported as good,  and he continues to take aspirin.  The patient's last echocardiogram, performed in October of the previous year, showed normal pump function with an ejection fraction of 60-65%. Grade 1 diastolic dysfunction was noted, which is considered normal for the patient's age. Mild dilation in the left atrium and some back leaking of the mitral valve were observed, likely due to the patient's history of aortic stenosis. The patient's aortic valve gradient had increased from 9 to 18 millimeters of mercury, indicating  potential wear and tear on the valve.  The patient denied any symptoms of heart racing, skipping, or flip-flopping. He also denied any pass out spells or significant changes in his symptoms. The patient's fatigue has been a gradual process, building up over several months.      Cardiovascular ROS: positive for - exercise intolerance, fatigue.  Seems worn out after exercise more than usual. negative for - chest pain, dyspnea on exertion, edema, irregular heartbeat, orthopnea, palpitations, paroxysmal nocturnal dyspnea, rapid heart rate, shortness of breath, or syncope or near syncope, TIA or amaurosis fugax, claudication  ROS:  Review of Systems - Negative except maybe will report a baseline fatigue.  Mild aches and pains.  Also mild memory loss.    Objective   Studies Reviewed: Marland Kitchen   EKG Interpretation Date/Time:  Friday November 24 2022 14:14:54 EDT Ventricular Rate:  67 PR Interval:  208 QRS Duration:  88 QT Interval:  390 QTC Calculation: 412 R Axis:   -44  Text Interpretation: Normal sinus rhythm Left axis deviation Septal infarct , age undetermined When compared with ECG of 31-Jul-2019 12:28, Premature ventricular complexes are no longer Present PR interval has increased Inverted T waves have replaced nonspecific T wave abnormality in Inferior leads Nonspecific T wave abnormality no longer evident in Lateral leads Confirmed by Bryan Lemma ([id number]) on 11/24/2022 2:27:25 PM    Results LABS Lipid Panel w/calc LDL Component 08/04/22 11/30/21 05/19/21 11/25/20 05/13/20 10/09/19  Cholesterol, Total 119 152 149 120 115 137  Triglyceride 138 125 147 115 167 141  HDL (High Density Lipoprotein) Cholesterol 51.7 62.0 52.3 44.3 41.9 46.2  LDL Calculated 40 65 67 53    Hemoglobin: 15.2 g/dL (86/5784) ONG2X: 5.2%; TSH: 3.22 (09/28/2022)   DIAGNOSTIC Echocardiogram: Normal pump function with ejection fraction 60-65%, grade 1 diastolic dysfunction, mild dilation in left atrium, mild mitral  valve regurgitation, increased aortic valve gradient from 9 to 18 mmHg (10/2021)  Pre-TAVR Right and Left Heart Cath: Stent open, moderate blockages in small side branches, moderate blockage upstream of stent (05/2019) Severe AS (mean gradent 44.2 mmHg). Stable CAD - 50-60% pRCA with widely patent stent. Small caliber D2 w/ ost 70%. Normal RHC Pressures.    Risk Assessment/Calculations:            Physical Exam:   VS:  BP 120/64   Pulse 67   Ht 5\' 8"  (1.727 m)   Wt 158 lb (71.7 kg)   SpO2 94%   BMI 24.02 kg/m    Wt Readings from Last 3 Encounters:  11/24/22 158 lb (71.7 kg)  09/26/21 158 lb (71.7 kg)  12/21/20 162 lb 9.6 oz (73.8 kg)    GEN: Well nourished, well groomed in no acute distress; appears well-developed for stated age.Baseline pale skin NECK: No JVD; No carotid bruits CARDIAC: RRR,Normal S1, S2; harsh 3/6 SEM at RUSB-MAC; otherwise no, rubs, gallops RESPIRATORY:  Clear to auscultation without rales, wheezing or rhonchi ; nonlabored, good air movement. ABDOMEN: Soft, non-tender, non-distended EXTREMITIES:  No edema;  No deformity     ASSESSMENT AND PLAN: .    Problem List Items Addressed This Visit       Cardiology Problems   Atherosclerotic heart disease of native coronary artery without angina pectoris (Chronic)    No active anginal symptoms.  Cardiac cath for preop TAVR in May 2021 showed widely patent stent in the RCA with proximal 60% RCA as well as moderate lesions in the ostial D1 and D2 location but these are relatively small diagonal branches.  Plan was for medical management. He is still exercises routinely without any active anginal symptoms despite having some exercise intolerance.  Plan: On stable regimen.  Would only consider ischemic evaluation if he is definitely noting some concerning chest discomfort symptoms along with his exertional fatigue. Remains on 81 mg aspirin-okay to hold for procedures or surgeries. He is on 20 mg rosuvastatin along with  10 mg ezetimibe (Zetia) for lipids Over the years, we have gradually weaned him off of antihypertensive agents-not on beta-blocker because of bradycardia and fatigue.  Not on ARB because of borderline hypotension.       CAD S/P percutaneous coronary angioplasty --> PCI RCA Promus DES 2.5 mm x 23 mm (Chronic)    Status post PCI to the RCA with widely patent stent in 2021.  No longer on Plavix.  He is only on aspirin daily.  Well enough away from his previous stent to be on monotherapy.  Okay to hold aspirin 5 to 7 days preop for surgeries or procedures.      Relevant Orders   EKG 12-Lead (Completed)   ECHOCARDIOGRAM COMPLETE   Essential hypertension (Chronic)   Hyperlipidemia associated with type 2 diabetes mellitus (HCC) (Chronic)    On Zetia 10 mg and rosuvastatin 20 mg with good control.-Most recent LDL was 40.  No yeah -Continue current management.   On Glipizide with last HbA1c of 6.3. -Continue current management.      Symptomatic PVCs; history of    Overall doing pretty well.  No further symptoms.  Trying to avoid beta-blockers.      Relevant Orders   EKG 12-Lead (Completed)   ECHOCARDIOGRAM COMPLETE     Other   Fatigue (Chronic)    Reports lack of energy but no associated chest pain, shortness of breath, or dizziness. No evidence of anemia, hypothyroidism, or or renal dysfunction on recent labs. -Encourage continued exercise regimen and incorporation of walking on non-gym days. -Plan for follow-up in early 2024 to reassess symptoms and discuss potential need for further evaluation.      Loss of memory   S/P TAVR (transcatheter aortic valve replacement) - Primary (Chronic)    History of TAVR in 2021 with noted increase in gradient from 9 to 18 mmHg on last echocardiogram. No symptoms of chest pain, shortness of breath, or syncope.  More prominent murmur noted on physical exam. -Order follow-up echo to assess valve function and progression of gradient. -Plan for  follow-up in early 2024 to discuss results and potential need for stress test or further intervention. SBE Prophylaxis: History of TAVR necessitates antibiotic prophylaxis for certain procedures. -Ensure antibiotic prophylaxis for any future dental work or invasive procedures.      Relevant Orders   EKG 12-Lead (Completed)   ECHOCARDIOGRAM COMPLETE         Dispo: Return in about 3 months (around 02/24/2023).  Total time spent: 28 min spent with patient + 19 min spent charting = 47 min    Signed, Piedad Climes.  Herbie Baltimore, MD, MS Bryan Lemma, M.D., M.S. Interventional Cardiologist  [facility]  Pager # [phone] Phone # [phone] [address] [place], [place] [address]

## 2022-11-25 ENCOUNTER — Encounter: Payer: Self-pay | Admitting: Cardiology

## 2022-11-25 NOTE — Assessment & Plan Note (Signed)
No active anginal symptoms.  Cardiac cath for preop TAVR in May 2021 showed widely patent stent in the RCA with proximal 60% RCA as well as moderate lesions in the ostial D1 and D2 location but these are relatively small diagonal branches.  Plan was for medical management. He is still exercises routinely without any active anginal symptoms despite having some exercise intolerance.  Plan: On stable regimen.  Would only consider ischemic evaluation if he is definitely noting some concerning chest discomfort symptoms along with his exertional fatigue. Remains on 81 mg aspirin-okay to hold for procedures or surgeries. He is on 20 mg rosuvastatin along with 10 mg ezetimibe (Zetia) for lipids Over the years, we have gradually weaned him off of antihypertensive agents-not on beta-blocker because of bradycardia and fatigue.  Not on ARB because of borderline hypotension.

## 2022-11-25 NOTE — Assessment & Plan Note (Signed)
Reports lack of energy but no associated chest pain, shortness of breath, or dizziness. No evidence of anemia, hypothyroidism, or or renal dysfunction on recent labs. -Encourage continued exercise regimen and incorporation of walking on non-gym days. -Plan for follow-up in early 2024 to reassess symptoms and discuss potential need for further evaluation.

## 2022-11-25 NOTE — Assessment & Plan Note (Signed)
Overall doing pretty well.  No further symptoms.  Trying to avoid beta-blockers.

## 2022-11-25 NOTE — Assessment & Plan Note (Signed)
Status post PCI to the RCA with widely patent stent in 2021.  No longer on Plavix.  He is only on aspirin daily.  Well enough away from his previous stent to be on monotherapy.  Okay to hold aspirin 5 to 7 days preop for surgeries or procedures.

## 2022-11-25 NOTE — Assessment & Plan Note (Signed)
On Zetia 10 mg and rosuvastatin 20 mg with good control.-Most recent LDL was 40.  No yeah -Continue current management.   On Glipizide with last HbA1c of 6.3. -Continue current management.

## 2022-11-25 NOTE — Assessment & Plan Note (Signed)
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 No symptoms of chest pain, shortness of breath, or syncope.  More prominent murmur noted on physical exam. -Order follow-up echo to assess valve function and progression of gradient. -Plan for follow-up in early 2024 to discuss results and potential need for stress test or further intervention. SBE Prophylaxis: History of TAVR necessitates antibiotic prophylaxis for certain procedures. -Ensure antibiotic prophylaxis for any future dental work or invasive procedures.

## 2022-12-01 ENCOUNTER — Other Ambulatory Visit: Payer: Self-pay | Admitting: Cardiology

## 2022-12-13 ENCOUNTER — Encounter: Payer: Self-pay | Admitting: Cardiology

## 2022-12-13 ENCOUNTER — Ambulatory Visit: Payer: Medicare Other | Attending: Cardiology

## 2022-12-13 DIAGNOSIS — Z952 Presence of prosthetic heart valve: Secondary | ICD-10-CM | POA: Insufficient documentation

## 2022-12-13 DIAGNOSIS — Z9861 Coronary angioplasty status: Secondary | ICD-10-CM | POA: Insufficient documentation

## 2022-12-13 DIAGNOSIS — I493 Ventricular premature depolarization: Secondary | ICD-10-CM | POA: Insufficient documentation

## 2022-12-13 DIAGNOSIS — I2510 Atherosclerotic heart disease of native coronary artery without angina pectoris: Secondary | ICD-10-CM | POA: Insufficient documentation

## 2022-12-13 LAB — ECHOCARDIOGRAM COMPLETE
AR max vel: 1.71 cm2
AV Area VTI: 1.66 cm2
AV Area mean vel: 1.65 cm2
AV Mean grad: 11 mm[Hg]
AV Peak grad: 19.9 mm[Hg]
Ao pk vel: 2.23 m/s
Area-P 1/2: 4.17 cm2
Calc EF: 58.9 %
S' Lateral: 3.2 cm
Single Plane A2C EF: 56.7 %
Single Plane A4C EF: 60.2 %

## 2022-12-18 ENCOUNTER — Encounter: Payer: Self-pay | Admitting: Cardiovascular Disease

## 2022-12-18 ENCOUNTER — Ambulatory Visit: Payer: Medicare Other | Attending: Cardiovascular Disease | Admitting: Cardiovascular Disease

## 2022-12-18 ENCOUNTER — Other Ambulatory Visit: Payer: Self-pay | Admitting: Cardiology

## 2022-12-18 VITALS — BP 115/50 | HR 66 | Ht 69.0 in | Wt 157.0 lb

## 2022-12-18 DIAGNOSIS — I340 Nonrheumatic mitral (valve) insufficiency: Secondary | ICD-10-CM | POA: Insufficient documentation

## 2022-12-18 DIAGNOSIS — Z01810 Encounter for preprocedural cardiovascular examination: Secondary | ICD-10-CM | POA: Diagnosis present

## 2022-12-18 NOTE — Progress Notes (Signed)
Cardiology Office Note:    Date:  12/18/2022   ID:  Victor [name], Victor [name] December 12, 1931, MRN [id number]  PCP:  Marina Goodell, MD   [facility] Providers Cardiologist:  Bryan Lemma, MD     Referring MD: Marina Goodell, MD   Chief Complaint  Patient presents with   Mitral Regurgitation    History of Present Illness:    Victor [name] is a 89 y.o. male referred by Dr. Herbie Baltimore for evaluation of mitral regurgitation. He has a history of severe AS and underwent TAVR in 2021 without complication. He complained of progressive fatigue at his recent office visit and was noted to have a more prominent murmur on exam. He underwent an echo that showed worsening mitral regurgitation compared with previous, now referred for evaluation of severe MR.   The patient is here with his wife today. She provides most of the history. He denies any symptoms of shortness of breath, edema, orthopnea, palpitations, lightheadedness, or chest pain. However, she has noticed that he is much more fatigued than in the past. He exercises at the gym on a regular basis and has no symptoms during exercise, but is more fatigued when he returns home. He has no other complaints today.    Current Medications: Current Meds  Medication Sig   acetaminophen (TYLENOL) 500 MG tablet Take 500 mg by mouth every 6 (six) hours as needed for moderate pain or headache.   Artificial Tear Solution (GENTEAL TEARS OP) Place 1 drop into both eyes daily.   ascorbic acid (VITAMIN C) 500 MG tablet Take 1,000-1,500 mg by mouth See admin instructions. Take 1000 mg in the morning and 1500 mg at night   aspirin EC 81 MG tablet Take 81 mg by mouth daily.   benzonatate (TESSALON) 200 MG capsule Take 200 mg by mouth 3 (three) times daily as needed for cough.   bismuth subsalicylate (PEPTO BISMOL) 262 MG/15ML suspension Take 30 mLs by mouth every 6 (six) hours as needed for indigestion or diarrhea or loose stools.   cetirizine (ZYRTEC)  10 MG tablet Take 10 mg by mouth daily.   Cholecalciferol (VITAMIN D3) 2000 UNITS TABS Take 4,000 Units by mouth 2 (two) times daily.    CINNAMON PO Take 1,000 mg by mouth 2 (two) times daily.   clindamycin (CLEOCIN) 300 MG capsule Take 2 capsules (600 mg) 1 hour prior to all dental visits.   Coenzyme Q10 (COQ-10) 100 MG CAPS Take 100 mg by mouth daily.   Cyanocobalamin (B-12) 2500 MCG TABS Take 2,500 mcg by mouth daily.   donepezil (ARICEPT) 10 MG tablet Take 10 mg by mouth daily.   EPIPEN 2-PAK 0.3 MG/0.3ML SOAJ injection Inject 0.3 mLs as directed as needed for anaphylaxis.    ezetimibe (ZETIA) 10 MG tablet Take 10 mg by mouth daily.   fluticasone (FLONASE) 50 MCG/ACT nasal spray Place 2 sprays into both nostrils 2 (two) times daily as needed for allergies.    gabapentin (NEURONTIN) 100 MG capsule Take 300 mg by mouth at bedtime.    glipiZIDE (GLUCOTROL XL) 2.5 MG 24 hr tablet Take 2.5 mg by mouth daily with breakfast.   Glucosamine HCl 1000 MG TABS Take 1,000 mg by mouth 2 (two) times daily.   ipratropium (ATROVENT) 0.03 % nasal spray Place 2 sprays into both nostrils 2 (two) times daily as needed for rhinitis.   loperamide (IMODIUM A-D) 2 MG tablet Take 2 mg by mouth daily as needed for diarrhea or loose  stools.   meloxicam (MOBIC) 15 MG tablet as needed for pain.   memantine (NAMENDA) 5 MG tablet Take 1 tablet by mouth daily.   Multiple Vitamin (MULITIVITAMIN WITH MINERALS) TABS Take 1 tablet by mouth 2 (two) times daily.    Omega 3 1200 MG CAPS Take 2,400 mg by mouth 2 (two) times daily.    Probiotic CAPS Take 1 capsule by mouth daily.   rosuvastatin (CRESTOR) 20 MG tablet TAKE 2 TABLET ON MONDAYS, WEDNESDAYS AND FRIDAYS AND 1 TABLET ON SATURDAY, SUNDAY, TUESDAY AND THURSDAY   Saw Palmetto 450 MG CAPS Take 900 mg by mouth daily.    triamcinolone cream (KENALOG) 0.1 % Apply 1 application topically 3 (three) times daily as needed for itching.   zinc gluconate 50 MG tablet Take 50 mg by  mouth daily.     Allergies:   Azithromycin, Cephalosporins, Codeine, Doxycycline, Phenylephrine-guaifenesin, Pseudoephedrine, Amoxicillin, Levofloxacin, Penicillins, Sulfa antibiotics, and Sulfasalazine   ROS:   Please see the history of present illness.    All other systems reviewed and are negative.  EKGs/Labs/Other Studies Reviewed:    The following studies were reviewed today: Cardiac Studies & Procedures   CARDIAC CATHETERIZATION  CARDIAC CATHETERIZATION 06/25/2019  Narrative  LV end diastolic pressure is normal.  Right heart cath pressures are normal  There is severe aortic valve stenosis. Mean gradient 44.2 mmHg  Prox RCA lesion is 60% stenosed. Stable from prior cath  Previously placed Prox RCA to Mid RCA stent (unknown type) is widely patent.  2nd Diag lesion is 70% stenosed. 3rd Diag lesion is 50% stenosed. -Both lesions are not favorable for PCI. Also stable.  SUMMARY  Confirmation of severe aortic stenosis with mean gradient 44.2 mmHg.  Peak to peak 45 mmHg. (By echo was 52 mmHg)  Stable coronary arteries with 50 to 60% proximal RCA followed by brief ectatic segment and then widely patent mid stent.  Otherwise small caliber 1st Diag ~70% ostial stenosis.  Normal right heart cath numbers.   Will refer to valve clinic for TAVR evaluation.    Bryan Lemma, MD  Findings Coronary Findings Diagnostic  Dominance: Right  Left Main Vessel was injected. Vessel is large. Vessel is angiographically normal.  Left Anterior Descending Vessel is angiographically normal. The vessel is tortuous.  First Diagonal Branch Vessel is large in size. Vessel is angiographically normal.  Second Diagonal Branch Vessel is small in size. 2nd Diag lesion is 70% stenosed. The lesion is focal, discrete and concentric.  Third Diagonal Branch Vessel is moderate in size. 3rd Diag lesion is 50% stenosed. The lesion is focal, discrete and concentric.  Left Circumflex Vessel  is large.  First Obtuse Marginal Branch Vessel is moderate in size. Vessel is angiographically normal.  Second Obtuse Marginal Branch Vessel is large in size. Vessel is angiographically normal. The vessel is tortuous.  First Left Posterolateral Branch Vessel is moderate in size.  Right Coronary Artery The vessel is mildly tortuous. The vessel is mildly ectatic. Focal ectasia after focal proximal stenosis Prox RCA lesion is 60% stenosed. The lesion is located at the bend, focal and discrete. Followed by Po stenotic ectasia Previously placed Prox RCA to Mid RCA stent (unknown type) is widely patent.  Right Ventricular Branch Vessel is small in size.  Right Posterior Atrioventricular Artery Vessel is small in size.  Intervention  No interventions have been documented.   STRESS TESTS  NM MYOCAR MULTI W/SPECT W 09/22/2014  Narrative  The study is normal.  This is a  low risk study.  The left ventricular ejection fraction is mildly decreased (45-54%).  Pharmacological myocardial perfusion imaging study with no significant  ischemia Normal wall motion, EF estimated at 46% No EKG changes concerning for ischemia. Low risk scan   ECHOCARDIOGRAM  ECHOCARDIOGRAM COMPLETE 12/13/2022  Narrative ECHOCARDIOGRAM REPORT    Patient Name:   Victor [name] Date of Exam: 12/13/2022 Medical Rec #:  [id number]      Height:       68.0 in Accession #:    [id number]     Weight:       158.0 lb Date of Birth:  1931/08/20      BSA:          1.849 m Patient Age:    89 years       BP:           122/60 mmHg Patient Gender: M              HR:           68 bpm. Exam Location:  [facility]  Procedure: 2D Echo, Cardiac Doppler and Color Doppler  Indications:    I35.8 Other nonrheumatic aortic valve disorders  History:        Patient has prior history of Echocardiogram examinations, most recent 11/17/2021. CAD, Aortic Valve Disease and Mitral Valve Disease; Risk Factors:Hypertension and  Diabetes. Aortic Valve: Sapien prosthetic, stented (TAVR) valve is present in the aortic position. Procedure Date: 07/29/19.  Sonographer:    Quentin Ore RDMS, RVT, RDCS Referring Phys: [id number] DAVID W HARDING  IMPRESSIONS   1. Left ventricular ejection fraction, by estimation, is 60 to 65%. The left ventricle has normal function. The left ventricle has no regional wall motion abnormalities. Left ventricular diastolic parameters are consistent with Grade I diastolic dysfunction (impaired relaxation). 2. Right ventricular systolic function is normal. The right ventricular size is normal. There is moderately elevated pulmonary artery systolic pressure. The estimated right ventricular systolic pressure is 45.7 mmHg. 3. Left atrial size was mildly dilated. 4. The mitral valve is normal in structure. Severe mitral valve regurgitation. No evidence of mitral stenosis. There is moderate holosystolic prolapse mitral valve, unable to determine leaflet. Moderate mitral annular calcification. 5. Tricuspid valve regurgitation is moderate to severe. 6. The aortic valve has been repaired/replaced. Aortic valve regurgitation is not visualized. No aortic stenosis is present. There is a Sapien prosthetic (TAVR) valve present in the aortic position. Procedure Date: 07/29/19. Aortic valve mean gradient measures 11.0 mmHg. 7. The inferior vena cava is normal in size with greater than 50% respiratory variability, suggesting right atrial pressure of 3 mmHg.  FINDINGS Left Ventricle: Left ventricular ejection fraction, by estimation, is 60 to 65%. The left ventricle has normal function. The left ventricle has no regional wall motion abnormalities. The left ventricular internal cavity size was normal in size. There is no left ventricular hypertrophy. Left ventricular diastolic parameters are consistent with Grade I diastolic dysfunction (impaired relaxation).  Right Ventricle: The right ventricular size is normal. No  increase in right ventricular wall thickness. Right ventricular systolic function is normal. There is moderately elevated pulmonary artery systolic pressure. The tricuspid regurgitant velocity is 3.19 m/s, and with an assumed right atrial pressure of 5 mmHg, the estimated right ventricular systolic pressure is 45.7 mmHg.  Left Atrium: Left atrial size was mildly dilated.  Right Atrium: Right atrial size was normal in size.  Pericardium: There is no evidence of pericardial effusion.  Mitral Valve: The mitral valve is  normal in structure. There is moderate holosystolic prolapse of both leaflets of the mitral valve. There is mild calcification of the mitral valve leaflet(s). Moderate mitral annular calcification. Severe mitral valve regurgitation. No evidence of mitral valve stenosis.  Tricuspid Valve: The tricuspid valve is normal in structure. Tricuspid valve regurgitation is moderate to severe. No evidence of tricuspid stenosis.  Aortic Valve: The aortic valve has been repaired/replaced. Aortic valve regurgitation is not visualized. No aortic stenosis is present. Aortic valve mean gradient measures 11.0 mmHg. Aortic valve peak gradient measures 19.9 mmHg. Aortic valve area, by VTI measures 1.66 cm. There is a Sapien prosthetic, stented (TAVR) valve present in the aortic position. Procedure Date: 07/29/19.  Pulmonic Valve: The pulmonic valve was normal in structure. Pulmonic valve regurgitation is mild. No evidence of pulmonic stenosis.  Aorta: The aortic root is normal in size and structure.  Venous: The inferior vena cava is normal in size with greater than 50% respiratory variability, suggesting right atrial pressure of 3 mmHg.  IAS/Shunts: No atrial level shunt detected by color flow Doppler.   LEFT VENTRICLE PLAX 2D LVIDd:         5.90 cm      Diastology LVIDs:         3.20 cm      LV e' medial:    6.96 cm/s LV PW:         1.00 cm      LV E/e' medial:  16.0 LV IVS:        0.90 cm       LV e' lateral:   6.20 cm/s LVOT diam:     2.00 cm      LV E/e' lateral: 18.0 LV SV:         78 LV SV Index:   42 LVOT Area:     3.14 cm  LV Volumes (MOD) LV vol d, MOD A2C: 96.9 ml LV vol d, MOD A4C: 123.0 ml LV vol s, MOD A2C: 42.0 ml LV vol s, MOD A4C: 48.9 ml LV SV MOD A2C:     54.9 ml LV SV MOD A4C:     123.0 ml LV SV MOD BP:      64.7 ml  RIGHT VENTRICLE             IVC RV S prime:     17.60 cm/s  IVC diam: 1.40 cm TAPSE (M-mode): 2.7 cm  LEFT ATRIUM           Index        RIGHT ATRIUM           Index LA diam:      4.40 cm 2.38 cm/m   RA Area:     21.00 cm LA Vol (A4C): 84.7 ml 45.81 ml/m  RA Volume:   60.30 ml  32.61 ml/m AORTIC VALVE                     PULMONIC VALVE AV Area (Vmax):    1.71 cm      PV Vmax:       1.33 m/s AV Area (Vmean):   1.65 cm      PV Peak grad:  7.0 mmHg AV Area (VTI):     1.66 cm AV Vmax:           223.00 cm/s AV Vmean:          150.400 cm/s AV VTI:  0.469 m AV Peak Grad:      19.9 mmHg AV Mean Grad:      11.0 mmHg LVOT Vmax:         121.33 cm/s LVOT Vmean:        79.200 cm/s LVOT VTI:          0.248 m LVOT/AV VTI ratio: 0.53  AORTA Ao Root diam: 3.40 cm Ao Asc diam:  3.20 cm Ao Arch diam: 2.5 cm  MITRAL VALVE                TRICUSPID VALVE MV Area (PHT): 4.17 cm     TR Peak grad:   40.7 mmHg MV Decel Time: 182 msec     TR Vmax:        319.00 cm/s MV E velocity: 111.50 cm/s MV A velocity: 140.00 cm/s  SHUNTS MV E/A ratio:  0.80         Systemic VTI:  0.25 m Systemic Diam: 2.00 cm  Julien Nordmann MD Electronically signed by Julien Nordmann MD Signature Date/Time: 12/13/2022/6:07:54 PM    Final     CT SCANS  CT CORONARY MORPH W/CTA COR W/SCORE 07/21/2019  Addendum 07/21/2019  5:49 PM ADDENDUM REPORT: 07/21/2019 17:46  CLINICAL DATA:  Severe Aortic Stenosis.  EXAM: Cardiac TAVR CT  TECHNIQUE: The patient was scanned on a [employer]. A 120 kV retrospective scan was triggered in the  descending thoracic aorta at 111 HU's. Gantry rotation speed was 250 msecs and collimation was .6 mm. No beta blockade or nitro were given. The 3D data set was reconstructed in 5% intervals of the R-R cycle. Systolic and diastolic phases were analyzed on a dedicated work station using MPR, MIP and VRT modes. The patient received 80 cc of contrast.  FINDINGS: Image quality: Excellent.  Noise artifact is: Limited.  Valve Morphology: The aortic valve is tricuspid. The NCC/LCC are heavily calcified with bulky calcifications extending to the leaflet bases. Leaflet motion is severely restricted in systole consistent with severe aortic stenosis.  Aortic Valve Calcium score: 5023  Aortic annular dimension:  Phase assessed: 30%  Annular area: 542 mm2  Annular perimeter: 84.7 mm  Max diameter: 30.3 mm  Min diameter: 23.4 mm  Annular and subannular calcification: There is mild annular calcification under the LCC extending into the LVOT and in continuity with the anterior mitral valve annulus.  Optimal coplanar projection: LAO 11 CAU 3  Coronary Artery Height above Annulus:  Left Main: 20.4 mm  Right Coronary: 22.7 mm  Sinus of Valsalva Measurements:  Non-coronary: 35 mm  Right-coronary: 36 mm  Left-coronary: 36 mm  Sinus of Valsalva Height:  Non-coronary: 23.8 mm  Right-coronary: 26.7 mm  Left-coronary: 26.4 mm  Sinotubular Junction: 31 mm  Ascending Thoracic Aorta: 34 mm  Coronary Arteries: Normal coronary origin. Right dominance. 3-vessel calcifications. The study was performed without use of NTG and is insufficient for plaque evaluation. Please refer to recent cardiac catheterization for coronary assessment.  Cardiac Morphology:  Right Atrium: Right atrial size is within normal limits.  Right Ventricle: The right ventricular cavity is within normal limits.  Left Atrium: Left atrial size is normal in size with no left atrial appendage filling  defect.  Left Ventricle: The ventricular cavity size is within normal limits. There are no stigmata of prior infarction. There is no abnormal filling defect. Normal left ventricular function, LVEF 55%. No regional wall motion abnormalities.  Pulmonary arteries: Normal in size without proximal filling defect.  Pulmonary  veins: Normal pulmonary venous drainage.  Pericardium: Normal thickness with no significant effusion or calcium present.  Mitral Valve: The mitral valve is degenerative with severe mitral annular calcification.  Extra-cardiac findings: See attached radiology report for non-cardiac structures.  IMPRESSION: 1. Annular measurements (542 mm2) appropriate for 26 mm Edwards Sapien 3 TAVR.  2. There is mild annular calcification under the LCC extending into the LVOT and in continuity with the anterior mitral valve annulus.  3. Sufficient coronary to annulus distance.  4. Optimal Fluoroscopic Angle for Delivery: LAO 11 CAU 3  5. Severe mitral annular calcification.  Gerri Spore T. Flora Lipps, MD   Electronically Signed By: Lennie Odor On: 07/21/2019 17:46  Narrative EXAM: OVER-READ INTERPRETATION  CT CHEST  The following report is an over-read performed by radiologist Dr. Trudie Reed of [facility] on 07/21/2019. This over-read does not include interpretation of cardiac or coronary anatomy or pathology. The coronary calcium score/coronary CTA interpretation by the cardiologist is attached.  COMPARISON:  None.  FINDINGS: Aortic atherosclerosis. Within the visualized portions of the thorax there are no suspicious appearing pulmonary nodules or masses, there is no acute consolidative airspace disease, no pleural effusions, no pneumothorax and no lymphadenopathy. Visualized portions of the upper abdomen are unremarkable. There are no aggressive appearing lytic or blastic lesions noted in the visualized portions of  the skeleton.  IMPRESSION: 1.  Aortic Atherosclerosis (ICD10-I70.0).  Electronically Signed: By: Trudie Reed M.D. On: 07/21/2019 11:46   CT SCANS  CT CORONARY MORPH W/CTA COR W/SCORE 07/16/2019  Addendum 07/20/2019  3:03 PM ADDENDUM REPORT: 07/20/2019 15:01  CLINICAL DATA:  Aortic stenosis  EXAM: Cardiac TAVR CT  TECHNIQUE: The patient was scanned on a Siemens Force 192 slice scanner. A 120 kV retrospective scan was triggered in the descending thoracic aorta at 111 HU's. Gantry rotation speed was 270 msecs and collimation was .9 mm. No beta blockade or nitro were given. The 3D data set was reconstructed in 5% intervals of the R-R cycle. Systolic and diastolic phases were analyzed on a dedicated work station using MPR, MIP and VRT modes.  FINDINGS: Inadequate contrast bolus timing to fully evaluate aortic annulus and aortic valve. All measurements made off of ECG gated sequence obtained at 65% of R-R interval with initial contrast bolus.  Aortic Valve: Unable to comment on aortic valve morphology or cusp separation. Severely thickened, severely calcified aortic valve cusps.  AV calcium score: 4790  Virtual Basal Annulus Measurements made at 65% of R-R interval:  Maximum/Minimum Diameter: 29.3 x 22.9 mm  Perimeter: 83.2 mm  Area: 523 mm2  LVOT calcifications present along aorto-mitral continuity.  Based on these measurements, the annulus would be suitable for a 26 mm Sapien 3 valve.  Sinus of Valsalva Measurements:  Non-coronary:  35 mm  Right - coronary:  35 mm  Left - coronary:  35 mm  Sinus of Valsalva Height:  Left: 23.6 mm  Right: 23.4 mm  Aorta: Normal variant common origin of the brachiocephalic artery and left common carotid artery. No significant plaque in ascending aorta or aortic arch. Moderate calcifications in descending thoracic aorta.  Sinotubular Junction: 31 mm  Ascending Thoracic Aorta:  33 mm  Aortic Arch: 27  mm  Descending Thoracic Aorta: 30 mm  Coronary Artery Height above Annulus:  Left Main: 17.7 mm  Right Coronary: 18.7 mm  Coronary Arteries: Three vessel coronary artery calcifications.  Optimum Fluoroscopic Angle for Delivery: LAO 7, CAU 7  Severe mitral annular calcifications.  IMPRESSION: 1.  Inadequate contrast bolus timing to fully evaluate aortic valve. Measurements obtained off single phase 65% R-R interval with adequately timed contrast bolus.  2. Unable to comment on aortic valve morphology or cusp separation. Severely thickened, severely calcified aortic valve cusps.  3.  AV calcium score: 4790  4.  Annulus area: 523 mm2, suitable for a 26 mm Sapien 3 valve.  5.  Adequate coronary artery height from annulus.  6. Optimum Fluoroscopic Angle for Delivery: LAO 7, CAU 7   Electronically Signed By: Weston Brass On: 07/20/2019 15:01  Narrative EXAM: OVER-READ INTERPRETATION  CT CHEST  The following report is an over-read performed by radiologist Dr. Trudie Reed of [facility] on 07/16/2019. This over-read does not include interpretation of cardiac or coronary anatomy or pathology. The coronary calcium score/coronary CTA interpretation by the cardiologist is attached.  COMPARISON:  None.  FINDINGS: Extracardiac findings are described separately under dictation for contemporaneously obtained CTA chest, abdomen and pelvis.  IMPRESSION: Please see separate dictation for contemporaneously obtained CTA chest, abdomen and pelvis 07/16/2019 for full description of relevant extracardiac findings.  Electronically Signed: By: Trudie Reed M.D. On: 07/16/2019 15:13          EKG:   EKG Interpretation Date/Time:  Monday December 18 2022 12:14:54 EST Ventricular Rate:  82 PR Interval:    QRS Duration:  88 QT Interval:  378 QTC Calculation: 441 R Axis:   -47  Text Interpretation: Accelerated Junctional rhythm with retrograde  conduction Left anterior fascicular block Septal infarct (cited on or before 24-Nov-2022) When compared with ECG of 24-Nov-2022 14:14, Junctional rhythm has replaced Sinus rhythm T wave inversion no longer evident in Inferior leads Nonspecific T wave abnormality now evident in Lateral leads Confirmed by Tonny Bollman [phone]) on 12/18/2022 12:48:17 PM    Recent Labs: No results found for requested labs within last 365 days.  Recent Lipid Panel    Component Value Date/Time   CHOL 127 03/22/2011 0545   TRIG 133 03/22/2011 0545   HDL 48 03/22/2011 0545   CHOLHDL 2.6 03/22/2011 0545   VLDL 27 03/22/2011 0545   LDLCALC 52 03/22/2011 0545     Risk Assessment/Calculations:                Physical Exam:    VS:  BP (!) 115/50 (BP Location: Left Arm, Patient Position: Sitting, Cuff Size: Normal)   Pulse 66   Ht 5\' 9"  (1.753 m)   Wt 157 lb (71.2 kg)   SpO2 92%   BMI 23.18 kg/m     Wt Readings from Last 3 Encounters:  12/18/22 157 lb (71.2 kg)  11/24/22 158 lb (71.7 kg)  09/26/21 158 lb (71.7 kg)     GEN:  Well nourished, well developed elderly male in no acute distress HEENT: Normal NECK: No JVD; No carotid bruits LYMPHATICS: No lymphadenopathy CARDIAC: RRR, 3/6 holosystolic murmur at the apex RESPIRATORY:  Clear to auscultation without rales, wheezing or rhonchi  ABDOMEN: Soft, non-tender, non-distended MUSCULOSKELETAL:  No edema; No deformity  SKIN: Warm and dry NEUROLOGIC:  Alert and oriented x 3 PSYCHIATRIC:  Normal affect   Assessment & Plan Nonrheumatic mitral (valve) insufficiency The patient's exam and echo are consistent with severe MR. He has a very prominent holosystolic MR murmur. His TAVR prosthesis appears to be functioning normally with normal transvalvular gradients and no paravalvular leak. He has NYHA functional class 2 symptoms of generalized fatigue but no specific exertional limitation. I have reviewed the natural history  of mitral regurgitation with  the patient and their family members who are present today. We have discussed the limitations of medical therapy and the poor prognosis associated with symptomatic mitral regurgitation. We have also reviewed potential treatment options, including palliative medical therapy, conventional surgical mitral valve repair or replacement, and percutaneous mitral valve therapies such as edge-to-edge mitral valve approximation with MitraClip. We discussed treatment options in the context of this patient's specific comorbid medical conditions. His age will limit his treatment options to TEER or medical therapy. If he has suitable anatomy for TEER, that would be a reasonable consideration. Fortunately he doesn't appear to be very symptomatic at this time. I have recommended a TEE to better define the functional anatomy of his mitral valve and assess anatomic suitability for TEER. I will follow-up with him after the TEE is completed for further recommendations . Pre-procedural cardiovascular examination Check pre-TEE labs       Informed Consent   Shared Decision Making/Informed Consent   The risks [esophageal damage, perforation (1:10,000 risk), bleeding, pharyngeal hematoma as well as other potential complications associated with conscious sedation including aspiration, arrhythmia, respiratory failure and death], benefits (treatment guidance and diagnostic support) and alternatives of a transesophageal echocardiogram were discussed in detail with Mr. Linthicum and he is willing to proceed.        Medication Adjustments/Labs and Tests Ordered: Current medicines are reviewed at length with the patient today.  Concerns regarding medicines are outlined above.  Orders Placed This Encounter  Procedures   CBC   Basic metabolic panel   EKG 12-Lead   No orders of the defined types were placed in this encounter.   Patient Instructions  Lab Work: CBC, BMET today If you have labs (blood work) drawn today and your  tests are completely normal, you will receive your results only by: MyChart Message (if you have MyChart) OR A paper copy in the mail If you have any lab test that is abnormal or we need to change your treatment, we will call you to review the results.  Testing/Procedures: Transesophageal Echocardiogram Your physician has requested that you have a TEE. During a TEE, sound waves are used to create images of your heart. It provides your doctor with information about the size and shape of your heart and how well your heart's chambers and valves are working. In this test, a transducer is attached to the end of a flexible tube that's guided down your throat and into your esophagus (the tube leading from you mouth to your stomach) to get a more detailed image of your heart. You are not awake for the procedure. Please see the instruction sheet given to you today. For further information please visit [email or web address]  Follow-Up: At [facility], you and your health needs are our priority.  As part of our continuing mission to provide you with exceptional heart care, we have created designated Provider Care Teams.  These Care Teams include your primary Cardiologist (physician) and Advanced Practice Providers (APPs -  Physician Assistants and Nurse Practitioners) who all work together to provide you with the care you need, when you need it.  We recommend signing up for the patient portal called "MyChart".  Sign up information is provided on this After Visit Summary.  MyChart is used to connect with patients for Virtual Visits (Telemedicine).  Patients are able to view lab/test results, encounter notes, upcoming appointments, etc.  Non-urgent messages can be sent to your provider as well.   To learn more  about what you can do with MyChart, go to [email or web address].    Your next appointment:   Structural Team will follow-up  Provider:   Tonny Bollman, MD   Other Instructions    Dear  Victor [name]  You are scheduled for a TEE (Transesophageal Echocardiogram) on Friday, November 29 with Dr. Flora Lipps.  Please arrive at the [facility] (Main Entrance A) at [facility]: [address] [place], [place] [address] at 12:15pm (This time is one hour(s) before your procedure to ensure your preparation).   Free valet parking service is available. You will check in at ADMITTING.   *Please Note: You will receive a call the day before your procedure to confirm the appointment time. That time may have changed from the original time based on the schedule for that day.*   DIET:  Nothing to eat or drink after midnight except a sip of water with medications (see medication instructions below)  MEDICATION INSTRUCTIONS: !!IF ANY NEW MEDICATIONS ARE STARTED AFTER TODAY, PLEASE NOTIFY YOUR PROVIDER AS SOON AS POSSIBLE!!  FYI: Medications such as Semaglutide (Ozempic, [place]), Tirzepatide (Mounjaro, Zepbound), Dulaglutide (Trulicity), etc ("GLP1 agonists") AND Canagliflozin (Invokana), Dapagliflozin (Farxiga), Empagliflozin (Jardiance), Ertugliflozin (Steglatro), Bexagliflozin [employer]) or any combination with one of these drugs such as Invokamet (Canagliflozin/Metformin), Synjardy (Empagliflozin/Metformin), etc ("SGLT2 inhibitors") must be held around the time of a procedure. This is not a comprehensive list of all of these drugs. Please review all of your medications and talk to your provider if you take any one of these. If you are not sure, ask your provider.   LABS: TODAY  FYI:  Your support person will be asked to wait in the waiting room during your procedure.  It is OK to have someone drop you off and come back when you are ready to be discharged.  You cannot drive after the procedure and will need someone to drive you home.  Bring your insurance cards.  *Special Note: Every effort is made to have your procedure done on time. Occasionally there are emergencies that occur at the  hospital that may cause delays. Please be patient if a delay does occur.       Signed, Tonny Bollman, MD  12/18/2022 12:52 PM    [facility]

## 2022-12-18 NOTE — Patient Instructions (Addendum)
Lab Work: CBC, BMET today If you have labs (blood work) drawn today and your tests are completely normal, you will receive your results only by: MyChart Message (if you have MyChart) OR A paper copy in the mail If you have any lab test that is abnormal or we need to change your treatment, we will call you to review the results.  Testing/Procedures: Transesophageal Echocardiogram Your physician has requested that you have a TEE. During a TEE, sound waves are used to create images of your heart. It provides your doctor with information about the size and shape of your heart and how well your heart's chambers and valves are working. In this test, a transducer is attached to the end of a flexible tube that's guided down your throat and into your esophagus (the tube leading from you mouth to your stomach) to get a more detailed image of your heart. You are not awake for the procedure. Please see the instruction sheet given to you today. For further information please visit [email or web address]  Follow-Up: At [facility], you and your health needs are our priority.  As part of our continuing mission to provide you with exceptional heart care, we have created designated Provider Care Teams.  These Care Teams include your primary Cardiologist (physician) and Advanced Practice Providers (APPs -  Physician Assistants and Nurse Practitioners) who all work together to provide you with the care you need, when you need it.  We recommend signing up for the patient portal called "MyChart".  Sign up information is provided on this After Visit Summary.  MyChart is used to connect with patients for Virtual Visits (Telemedicine).  Patients are able to view lab/test results, encounter notes, upcoming appointments, etc.  Non-urgent messages can be sent to your provider as well.   To learn more about what you can do with MyChart, go to [email or web address].    Your next appointment:   Structural Team will  follow-up  Provider:   Tonny Bollman, MD   Other Instructions    Dear Victor [name]  You are scheduled for a TEE (Transesophageal Echocardiogram) on Friday, November 29 with Dr. Flora Lipps.  Please arrive at the [facility] (Main Entrance A) at [facility]: [address] [place], [place] [address] at 12:15pm (This time is one hour(s) before your procedure to ensure your preparation).   Free valet parking service is available. You will check in at ADMITTING.   *Please Note: You will receive a call the day before your procedure to confirm the appointment time. That time may have changed from the original time based on the schedule for that day.*   DIET:  Nothing to eat or drink after midnight except a sip of water with medications (see medication instructions below)  MEDICATION INSTRUCTIONS: !!IF ANY NEW MEDICATIONS ARE STARTED AFTER TODAY, PLEASE NOTIFY YOUR PROVIDER AS SOON AS POSSIBLE!!  FYI: Medications such as Semaglutide (Ozempic, [place]), Tirzepatide (Mounjaro, Zepbound), Dulaglutide (Trulicity), etc ("GLP1 agonists") AND Canagliflozin (Invokana), Dapagliflozin (Farxiga), Empagliflozin (Jardiance), Ertugliflozin (Steglatro), Bexagliflozin [employer]) or any combination with one of these drugs such as Invokamet (Canagliflozin/Metformin), Synjardy (Empagliflozin/Metformin), etc ("SGLT2 inhibitors") must be held around the time of a procedure. This is not a comprehensive list of all of these drugs. Please review all of your medications and talk to your provider if you take any one of these. If you are not sure, ask your provider.   LABS: TODAY  FYI:  Your support person will be asked to wait in the  waiting room during your procedure.  It is OK to have someone drop you off and come back when you are ready to be discharged.  You cannot drive after the procedure and will need someone to drive you home.  Bring your insurance cards.  *Special Note: Every effort is made to have your  procedure done on time. Occasionally there are emergencies that occur at the hospital that may cause delays. Please be patient if a delay does occur.

## 2022-12-18 NOTE — H&P (View-Only) (Signed)
 Cardiology Office Note:    Date:  12/18/2022   ID:  [name], Victor December 12, 1931, MRN [id number]  PCP:  Marina Goodell, MD   [facility] Providers Cardiologist:  Bryan Lemma, MD     Referring MD: Marina Goodell, MD   Chief Complaint  Patient presents with   Mitral Regurgitation    History of Present Illness:    Victor [name] is a 89 y.o. male referred by Dr. Herbie Baltimore for evaluation of mitral regurgitation. He has a history of severe AS and underwent TAVR in 2021 without complication. He complained of progressive fatigue at his recent office visit and was noted to have a more prominent murmur on exam. He underwent an echo that showed worsening mitral regurgitation compared with previous, now referred for evaluation of severe MR.   The patient is here with his wife today. She provides most of the history. He denies any symptoms of shortness of breath, edema, orthopnea, palpitations, lightheadedness, or chest pain. However, she has noticed that he is much more fatigued than in the past. He exercises at the gym on a regular basis and has no symptoms during exercise, but is more fatigued when he returns home. He has no other complaints today.    Current Medications: Current Meds  Medication Sig   acetaminophen (TYLENOL) 500 MG tablet Take 500 mg by mouth every 6 (six) hours as needed for moderate pain or headache.   Artificial Tear Solution (GENTEAL TEARS OP) Place 1 drop into both eyes daily.   ascorbic acid (VITAMIN C) 500 MG tablet Take 1,000-1,500 mg by mouth See admin instructions. Take 1000 mg in the morning and 1500 mg at night   aspirin EC 81 MG tablet Take 81 mg by mouth daily.   benzonatate (TESSALON) 200 MG capsule Take 200 mg by mouth 3 (three) times daily as needed for cough.   bismuth subsalicylate (PEPTO BISMOL) 262 MG/15ML suspension Take 30 mLs by mouth every 6 (six) hours as needed for indigestion or diarrhea or loose stools.   cetirizine (ZYRTEC)  10 MG tablet Take 10 mg by mouth daily.   Cholecalciferol (VITAMIN D3) 2000 UNITS TABS Take 4,000 Units by mouth 2 (two) times daily.    CINNAMON PO Take 1,000 mg by mouth 2 (two) times daily.   clindamycin (CLEOCIN) 300 MG capsule Take 2 capsules (600 mg) 1 hour prior to all dental visits.   Coenzyme Q10 (COQ-10) 100 MG CAPS Take 100 mg by mouth daily.   Cyanocobalamin (B-12) 2500 MCG TABS Take 2,500 mcg by mouth daily.   donepezil (ARICEPT) 10 MG tablet Take 10 mg by mouth daily.   EPIPEN 2-PAK 0.3 MG/0.3ML SOAJ injection Inject 0.3 mLs as directed as needed for anaphylaxis.    ezetimibe (ZETIA) 10 MG tablet Take 10 mg by mouth daily.   fluticasone (FLONASE) 50 MCG/ACT nasal spray Place 2 sprays into both nostrils 2 (two) times daily as needed for allergies.    gabapentin (NEURONTIN) 100 MG capsule Take 300 mg by mouth at bedtime.    glipiZIDE (GLUCOTROL XL) 2.5 MG 24 hr tablet Take 2.5 mg by mouth daily with breakfast.   Glucosamine HCl 1000 MG TABS Take 1,000 mg by mouth 2 (two) times daily.   ipratropium (ATROVENT) 0.03 % nasal spray Place 2 sprays into both nostrils 2 (two) times daily as needed for rhinitis.   loperamide (IMODIUM A-D) 2 MG tablet Take 2 mg by mouth daily as needed for diarrhea or loose  stools.   meloxicam (MOBIC) 15 MG tablet as needed for pain.   memantine (NAMENDA) 5 MG tablet Take 1 tablet by mouth daily.   Multiple Vitamin (MULITIVITAMIN WITH MINERALS) TABS Take 1 tablet by mouth 2 (two) times daily.    Omega 3 1200 MG CAPS Take 2,400 mg by mouth 2 (two) times daily.    Probiotic CAPS Take 1 capsule by mouth daily.   rosuvastatin (CRESTOR) 20 MG tablet TAKE 2 TABLET ON MONDAYS, WEDNESDAYS AND FRIDAYS AND 1 TABLET ON SATURDAY, SUNDAY, TUESDAY AND THURSDAY   Saw Palmetto 450 MG CAPS Take 900 mg by mouth daily.    triamcinolone cream (KENALOG) 0.1 % Apply 1 application topically 3 (three) times daily as needed for itching.   zinc gluconate 50 MG tablet Take 50 mg by  mouth daily.     Allergies:   Azithromycin, Cephalosporins, Codeine, Doxycycline, Phenylephrine-guaifenesin, Pseudoephedrine, Amoxicillin, Levofloxacin, Penicillins, Sulfa antibiotics, and Sulfasalazine   ROS:   Please see the history of present illness.    All other systems reviewed and are negative.  EKGs/Labs/Other Studies Reviewed:    The following studies were reviewed today: Cardiac Studies & Procedures   CARDIAC CATHETERIZATION  CARDIAC CATHETERIZATION 06/25/2019  Narrative  LV end diastolic pressure is normal.  Right heart cath pressures are normal  There is severe aortic valve stenosis. Mean gradient 44.2 mmHg  Prox RCA lesion is 60% stenosed. Stable from prior cath  Previously placed Prox RCA to Mid RCA stent (unknown type) is widely patent.  2nd Diag lesion is 70% stenosed. 3rd Diag lesion is 50% stenosed. -Both lesions are not favorable for PCI. Also stable.  SUMMARY  Confirmation of severe aortic stenosis with mean gradient 44.2 mmHg.  Peak to peak 45 mmHg. (By echo was 52 mmHg)  Stable coronary arteries with 50 to 60% proximal RCA followed by brief ectatic segment and then widely patent mid stent.  Otherwise small caliber 1st Diag ~70% ostial stenosis.  Normal right heart cath numbers.   Will refer to valve clinic for TAVR evaluation.    Bryan Lemma, MD  Findings Coronary Findings Diagnostic  Dominance: Right  Left Main Vessel was injected. Vessel is large. Vessel is angiographically normal.  Left Anterior Descending Vessel is angiographically normal. The vessel is tortuous.  First Diagonal Branch Vessel is large in size. Vessel is angiographically normal.  Second Diagonal Branch Vessel is small in size. 2nd Diag lesion is 70% stenosed. The lesion is focal, discrete and concentric.  Third Diagonal Branch Vessel is moderate in size. 3rd Diag lesion is 50% stenosed. The lesion is focal, discrete and concentric.  Left Circumflex Vessel  is large.  First Obtuse Marginal Branch Vessel is moderate in size. Vessel is angiographically normal.  Second Obtuse Marginal Branch Vessel is large in size. Vessel is angiographically normal. The vessel is tortuous.  First Left Posterolateral Branch Vessel is moderate in size.  Right Coronary Artery The vessel is mildly tortuous. The vessel is mildly ectatic. Focal ectasia after focal proximal stenosis Prox RCA lesion is 60% stenosed. The lesion is located at the bend, focal and discrete. Followed by Po stenotic ectasia Previously placed Prox RCA to Mid RCA stent (unknown type) is widely patent.  Right Ventricular Branch Vessel is small in size.  Right Posterior Atrioventricular Artery Vessel is small in size.  Intervention  No interventions have been documented.   STRESS TESTS  NM MYOCAR MULTI W/SPECT W 09/22/2014  Narrative  The study is normal.  This is a  low risk study.  The left ventricular ejection fraction is mildly decreased (45-54%).  Pharmacological myocardial perfusion imaging study with no significant  ischemia Normal wall motion, EF estimated at 46% No EKG changes concerning for ischemia. Low risk scan   ECHOCARDIOGRAM  ECHOCARDIOGRAM COMPLETE 12/13/2022  Narrative ECHOCARDIOGRAM REPORT    Patient Name:   CLELAND GEHRIS Date of Exam: 12/13/2022 Medical Rec #:  [id number]      Height:       68.0 in Accession #:    [id number]     Weight:       158.0 lb Date of Birth:  1931/08/20      BSA:          1.849 m Patient Age:    89 years       BP:           122/60 mmHg Patient Gender: M              HR:           68 bpm. Exam Location:  [facility]  Procedure: 2D Echo, Cardiac Doppler and Color Doppler  Indications:    I35.8 Other nonrheumatic aortic valve disorders  History:        Patient has prior history of Echocardiogram examinations, most recent 11/17/2021. CAD, Aortic Valve Disease and Mitral Valve Disease; Risk Factors:Hypertension and  Diabetes. Aortic Valve: Sapien prosthetic, stented (TAVR) valve is present in the aortic position. Procedure Date: 07/29/19.  Sonographer:    Quentin Ore RDMS, RVT, RDCS Referring Phys: [id number] DAVID W HARDING  IMPRESSIONS   1. Left ventricular ejection fraction, by estimation, is 60 to 65%. The left ventricle has normal function. The left ventricle has no regional wall motion abnormalities. Left ventricular diastolic parameters are consistent with Grade I diastolic dysfunction (impaired relaxation). 2. Right ventricular systolic function is normal. The right ventricular size is normal. There is moderately elevated pulmonary artery systolic pressure. The estimated right ventricular systolic pressure is 45.7 mmHg. 3. Left atrial size was mildly dilated. 4. The mitral valve is normal in structure. Severe mitral valve regurgitation. No evidence of mitral stenosis. There is moderate holosystolic prolapse mitral valve, unable to determine leaflet. Moderate mitral annular calcification. 5. Tricuspid valve regurgitation is moderate to severe. 6. The aortic valve has been repaired/replaced. Aortic valve regurgitation is not visualized. No aortic stenosis is present. There is a Sapien prosthetic (TAVR) valve present in the aortic position. Procedure Date: 07/29/19. Aortic valve mean gradient measures 11.0 mmHg. 7. The inferior vena cava is normal in size with greater than 50% respiratory variability, suggesting right atrial pressure of 3 mmHg.  FINDINGS Left Ventricle: Left ventricular ejection fraction, by estimation, is 60 to 65%. The left ventricle has normal function. The left ventricle has no regional wall motion abnormalities. The left ventricular internal cavity size was normal in size. There is no left ventricular hypertrophy. Left ventricular diastolic parameters are consistent with Grade I diastolic dysfunction (impaired relaxation).  Right Ventricle: The right ventricular size is normal. No  increase in right ventricular wall thickness. Right ventricular systolic function is normal. There is moderately elevated pulmonary artery systolic pressure. The tricuspid regurgitant velocity is 3.19 m/s, and with an assumed right atrial pressure of 5 mmHg, the estimated right ventricular systolic pressure is 45.7 mmHg.  Left Atrium: Left atrial size was mildly dilated.  Right Atrium: Right atrial size was normal in size.  Pericardium: There is no evidence of pericardial effusion.  Mitral Valve: The mitral valve is  normal in structure. There is moderate holosystolic prolapse of both leaflets of the mitral valve. There is mild calcification of the mitral valve leaflet(s). Moderate mitral annular calcification. Severe mitral valve regurgitation. No evidence of mitral valve stenosis.  Tricuspid Valve: The tricuspid valve is normal in structure. Tricuspid valve regurgitation is moderate to severe. No evidence of tricuspid stenosis.  Aortic Valve: The aortic valve has been repaired/replaced. Aortic valve regurgitation is not visualized. No aortic stenosis is present. Aortic valve mean gradient measures 11.0 mmHg. Aortic valve peak gradient measures 19.9 mmHg. Aortic valve area, by VTI measures 1.66 cm. There is a Sapien prosthetic, stented (TAVR) valve present in the aortic position. Procedure Date: 07/29/19.  Pulmonic Valve: The pulmonic valve was normal in structure. Pulmonic valve regurgitation is mild. No evidence of pulmonic stenosis.  Aorta: The aortic root is normal in size and structure.  Venous: The inferior vena cava is normal in size with greater than 50% respiratory variability, suggesting right atrial pressure of 3 mmHg.  IAS/Shunts: No atrial level shunt detected by color flow Doppler.   LEFT VENTRICLE PLAX 2D LVIDd:         5.90 cm      Diastology LVIDs:         3.20 cm      LV e' medial:    6.96 cm/s LV PW:         1.00 cm      LV E/e' medial:  16.0 LV IVS:        0.90 cm       LV e' lateral:   6.20 cm/s LVOT diam:     2.00 cm      LV E/e' lateral: 18.0 LV SV:         78 LV SV Index:   42 LVOT Area:     3.14 cm  LV Volumes (MOD) LV vol d, MOD A2C: 96.9 ml LV vol d, MOD A4C: 123.0 ml LV vol s, MOD A2C: 42.0 ml LV vol s, MOD A4C: 48.9 ml LV SV MOD A2C:     54.9 ml LV SV MOD A4C:     123.0 ml LV SV MOD BP:      64.7 ml  RIGHT VENTRICLE             IVC RV S prime:     17.60 cm/s  IVC diam: 1.40 cm TAPSE (M-mode): 2.7 cm  LEFT ATRIUM           Index        RIGHT ATRIUM           Index LA diam:      4.40 cm 2.38 cm/m   RA Area:     21.00 cm LA Vol (A4C): 84.7 ml 45.81 ml/m  RA Volume:   60.30 ml  32.61 ml/m AORTIC VALVE                     PULMONIC VALVE AV Area (Vmax):    1.71 cm      PV Vmax:       1.33 m/s AV Area (Vmean):   1.65 cm      PV Peak grad:  7.0 mmHg AV Area (VTI):     1.66 cm AV Vmax:           223.00 cm/s AV Vmean:          150.400 cm/s AV VTI:  0.469 m AV Peak Grad:      19.9 mmHg AV Mean Grad:      11.0 mmHg LVOT Vmax:         121.33 cm/s LVOT Vmean:        79.200 cm/s LVOT VTI:          0.248 m LVOT/AV VTI ratio: 0.53  AORTA Ao Root diam: 3.40 cm Ao Asc diam:  3.20 cm Ao Arch diam: 2.5 cm  MITRAL VALVE                TRICUSPID VALVE MV Area (PHT): 4.17 cm     TR Peak grad:   40.7 mmHg MV Decel Time: 182 msec     TR Vmax:        319.00 cm/s MV E velocity: 111.50 cm/s MV A velocity: 140.00 cm/s  SHUNTS MV E/A ratio:  0.80         Systemic VTI:  0.25 m Systemic Diam: 2.00 cm  Julien Nordmann MD Electronically signed by Julien Nordmann MD Signature Date/Time: 12/13/2022/6:07:54 PM    Final     CT SCANS  CT CORONARY MORPH W/CTA COR W/SCORE 07/21/2019  Addendum 07/21/2019  5:49 PM ADDENDUM REPORT: 07/21/2019 17:46  CLINICAL DATA:  Severe Aortic Stenosis.  EXAM: Cardiac TAVR CT  TECHNIQUE: The patient was scanned on a [employer]. A 120 kV retrospective scan was triggered in the  descending thoracic aorta at 111 HU's. Gantry rotation speed was 250 msecs and collimation was .6 mm. No beta blockade or nitro were given. The 3D data set was reconstructed in 5% intervals of the R-R cycle. Systolic and diastolic phases were analyzed on a dedicated work station using MPR, MIP and VRT modes. The patient received 80 cc of contrast.  FINDINGS: Image quality: Excellent.  Noise artifact is: Limited.  Valve Morphology: The aortic valve is tricuspid. The NCC/LCC are heavily calcified with bulky calcifications extending to the leaflet bases. Leaflet motion is severely restricted in systole consistent with severe aortic stenosis.  Aortic Valve Calcium score: 5023  Aortic annular dimension:  Phase assessed: 30%  Annular area: 542 mm2  Annular perimeter: 84.7 mm  Max diameter: 30.3 mm  Min diameter: 23.4 mm  Annular and subannular calcification: There is mild annular calcification under the LCC extending into the LVOT and in continuity with the anterior mitral valve annulus.  Optimal coplanar projection: LAO 11 CAU 3  Coronary Artery Height above Annulus:  Left Main: 20.4 mm  Right Coronary: 22.7 mm  Sinus of Valsalva Measurements:  Non-coronary: 35 mm  Right-coronary: 36 mm  Left-coronary: 36 mm  Sinus of Valsalva Height:  Non-coronary: 23.8 mm  Right-coronary: 26.7 mm  Left-coronary: 26.4 mm  Sinotubular Junction: 31 mm  Ascending Thoracic Aorta: 34 mm  Coronary Arteries: Normal coronary origin. Right dominance. 3-vessel calcifications. The study was performed without use of NTG and is insufficient for plaque evaluation. Please refer to recent cardiac catheterization for coronary assessment.  Cardiac Morphology:  Right Atrium: Right atrial size is within normal limits.  Right Ventricle: The right ventricular cavity is within normal limits.  Left Atrium: Left atrial size is normal in size with no left atrial appendage filling  defect.  Left Ventricle: The ventricular cavity size is within normal limits. There are no stigmata of prior infarction. There is no abnormal filling defect. Normal left ventricular function, LVEF 55%. No regional wall motion abnormalities.  Pulmonary arteries: Normal in size without proximal filling defect.  Pulmonary  veins: Normal pulmonary venous drainage.  Pericardium: Normal thickness with no significant effusion or calcium present.  Mitral Valve: The mitral valve is degenerative with severe mitral annular calcification.  Extra-cardiac findings: See attached radiology report for non-cardiac structures.  IMPRESSION: 1. Annular measurements (542 mm2) appropriate for 26 mm Edwards Sapien 3 TAVR.  2. There is mild annular calcification under the LCC extending into the LVOT and in continuity with the anterior mitral valve annulus.  3. Sufficient coronary to annulus distance.  4. Optimal Fluoroscopic Angle for Delivery: LAO 11 CAU 3  5. Severe mitral annular calcification.  Gerri Spore T. Flora Lipps, MD   Electronically Signed By: Lennie Odor On: 07/21/2019 17:46  Narrative EXAM: OVER-READ INTERPRETATION  CT CHEST  The following report is an over-read performed by radiologist Dr. Trudie Reed of [facility] on 07/21/2019. This over-read does not include interpretation of cardiac or coronary anatomy or pathology. The coronary calcium score/coronary CTA interpretation by the cardiologist is attached.  COMPARISON:  None.  FINDINGS: Aortic atherosclerosis. Within the visualized portions of the thorax there are no suspicious appearing pulmonary nodules or masses, there is no acute consolidative airspace disease, no pleural effusions, no pneumothorax and no lymphadenopathy. Visualized portions of the upper abdomen are unremarkable. There are no aggressive appearing lytic or blastic lesions noted in the visualized portions of  the skeleton.  IMPRESSION: 1.  Aortic Atherosclerosis (ICD10-I70.0).  Electronically Signed: By: Trudie Reed M.D. On: 07/21/2019 11:46   CT SCANS  CT CORONARY MORPH W/CTA COR W/SCORE 07/16/2019  Addendum 07/20/2019  3:03 PM ADDENDUM REPORT: 07/20/2019 15:01  CLINICAL DATA:  Aortic stenosis  EXAM: Cardiac TAVR CT  TECHNIQUE: The patient was scanned on a Siemens Force 192 slice scanner. A 120 kV retrospective scan was triggered in the descending thoracic aorta at 111 HU's. Gantry rotation speed was 270 msecs and collimation was .9 mm. No beta blockade or nitro were given. The 3D data set was reconstructed in 5% intervals of the R-R cycle. Systolic and diastolic phases were analyzed on a dedicated work station using MPR, MIP and VRT modes.  FINDINGS: Inadequate contrast bolus timing to fully evaluate aortic annulus and aortic valve. All measurements made off of ECG gated sequence obtained at 65% of R-R interval with initial contrast bolus.  Aortic Valve: Unable to comment on aortic valve morphology or cusp separation. Severely thickened, severely calcified aortic valve cusps.  AV calcium score: 4790  Virtual Basal Annulus Measurements made at 65% of R-R interval:  Maximum/Minimum Diameter: 29.3 x 22.9 mm  Perimeter: 83.2 mm  Area: 523 mm2  LVOT calcifications present along aorto-mitral continuity.  Based on these measurements, the annulus would be suitable for a 26 mm Sapien 3 valve.  Sinus of Valsalva Measurements:  Non-coronary:  35 mm  Right - coronary:  35 mm  Left - coronary:  35 mm  Sinus of Valsalva Height:  Left: 23.6 mm  Right: 23.4 mm  Aorta: Normal variant common origin of the brachiocephalic artery and left common carotid artery. No significant plaque in ascending aorta or aortic arch. Moderate calcifications in descending thoracic aorta.  Sinotubular Junction: 31 mm  Ascending Thoracic Aorta:  33 mm  Aortic Arch: 27  mm  Descending Thoracic Aorta: 30 mm  Coronary Artery Height above Annulus:  Left Main: 17.7 mm  Right Coronary: 18.7 mm  Coronary Arteries: Three vessel coronary artery calcifications.  Optimum Fluoroscopic Angle for Delivery: LAO 7, CAU 7  Severe mitral annular calcifications.  IMPRESSION: 1.  Inadequate contrast bolus timing to fully evaluate aortic valve. Measurements obtained off single phase 65% R-R interval with adequately timed contrast bolus.  2. Unable to comment on aortic valve morphology or cusp separation. Severely thickened, severely calcified aortic valve cusps.  3.  AV calcium score: 4790  4.  Annulus area: 523 mm2, suitable for a 26 mm Sapien 3 valve.  5.  Adequate coronary artery height from annulus.  6. Optimum Fluoroscopic Angle for Delivery: LAO 7, CAU 7   Electronically Signed By: Weston Brass On: 07/20/2019 15:01  Narrative EXAM: OVER-READ INTERPRETATION  CT CHEST  The following report is an over-read performed by radiologist Dr. Trudie Reed of [facility] on 07/16/2019. This over-read does not include interpretation of cardiac or coronary anatomy or pathology. The coronary calcium score/coronary CTA interpretation by the cardiologist is attached.  COMPARISON:  None.  FINDINGS: Extracardiac findings are described separately under dictation for contemporaneously obtained CTA chest, abdomen and pelvis.  IMPRESSION: Please see separate dictation for contemporaneously obtained CTA chest, abdomen and pelvis 07/16/2019 for full description of relevant extracardiac findings.  Electronically Signed: By: Trudie Reed M.D. On: 07/16/2019 15:13          EKG:   EKG Interpretation Date/Time:  Monday December 18 2022 12:14:54 EST Ventricular Rate:  82 PR Interval:    QRS Duration:  88 QT Interval:  378 QTC Calculation: 441 R Axis:   -47  Text Interpretation: Accelerated Junctional rhythm with retrograde  conduction Left anterior fascicular block Septal infarct (cited on or before 24-Nov-2022) When compared with ECG of 24-Nov-2022 14:14, Junctional rhythm has replaced Sinus rhythm T wave inversion no longer evident in Inferior leads Nonspecific T wave abnormality now evident in Lateral leads Confirmed by Tonny Bollman [phone]) on 12/18/2022 12:48:17 PM    Recent Labs: No results found for requested labs within last 365 days.  Recent Lipid Panel    Component Value Date/Time   CHOL 127 03/22/2011 0545   TRIG 133 03/22/2011 0545   HDL 48 03/22/2011 0545   CHOLHDL 2.6 03/22/2011 0545   VLDL 27 03/22/2011 0545   LDLCALC 52 03/22/2011 0545     Risk Assessment/Calculations:                Physical Exam:    VS:  BP (!) 115/50 (BP Location: Left Arm, Patient Position: Sitting, Cuff Size: Normal)   Pulse 66   Ht 5\' 9"  (1.753 m)   Wt 157 lb (71.2 kg)   SpO2 92%   BMI 23.18 kg/m     Wt Readings from Last 3 Encounters:  12/18/22 157 lb (71.2 kg)  11/24/22 158 lb (71.7 kg)  09/26/21 158 lb (71.7 kg)     GEN:  Well nourished, well developed elderly male in no acute distress HEENT: Normal NECK: No JVD; No carotid bruits LYMPHATICS: No lymphadenopathy CARDIAC: RRR, 3/6 holosystolic murmur at the apex RESPIRATORY:  Clear to auscultation without rales, wheezing or rhonchi  ABDOMEN: Soft, non-tender, non-distended MUSCULOSKELETAL:  No edema; No deformity  SKIN: Warm and dry NEUROLOGIC:  Alert and oriented x 3 PSYCHIATRIC:  Normal affect   Assessment & Plan Nonrheumatic mitral (valve) insufficiency The patient's exam and echo are consistent with severe MR. He has a very prominent holosystolic MR murmur. His TAVR prosthesis appears to be functioning normally with normal transvalvular gradients and no paravalvular leak. He has NYHA functional class 2 symptoms of generalized fatigue but no specific exertional limitation. I have reviewed the natural history  of mitral regurgitation with  the patient and their family members who are present today. We have discussed the limitations of medical therapy and the poor prognosis associated with symptomatic mitral regurgitation. We have also reviewed potential treatment options, including palliative medical therapy, conventional surgical mitral valve repair or replacement, and percutaneous mitral valve therapies such as edge-to-edge mitral valve approximation with MitraClip. We discussed treatment options in the context of this patient's specific comorbid medical conditions. His age will limit his treatment options to TEER or medical therapy. If he has suitable anatomy for TEER, that would be a reasonable consideration. Fortunately he doesn't appear to be very symptomatic at this time. I have recommended a TEE to better define the functional anatomy of his mitral valve and assess anatomic suitability for TEER. I will follow-up with him after the TEE is completed for further recommendations . Pre-procedural cardiovascular examination Check pre-TEE labs       Informed Consent   Shared Decision Making/Informed Consent   The risks [esophageal damage, perforation (1:10,000 risk), bleeding, pharyngeal hematoma as well as other potential complications associated with conscious sedation including aspiration, arrhythmia, respiratory failure and death], benefits (treatment guidance and diagnostic support) and alternatives of a transesophageal echocardiogram were discussed in detail with Mr. Linthicum and he is willing to proceed.        Medication Adjustments/Labs and Tests Ordered: Current medicines are reviewed at length with the patient today.  Concerns regarding medicines are outlined above.  Orders Placed This Encounter  Procedures   CBC   Basic metabolic panel   EKG 12-Lead   No orders of the defined types were placed in this encounter.   Patient Instructions  Lab Work: CBC, BMET today If you have labs (blood work) drawn today and your  tests are completely normal, you will receive your results only by: MyChart Message (if you have MyChart) OR A paper copy in the mail If you have any lab test that is abnormal or we need to change your treatment, we will call you to review the results.  Testing/Procedures: Transesophageal Echocardiogram Your physician has requested that you have a TEE. During a TEE, sound waves are used to create images of your heart. It provides your doctor with information about the size and shape of your heart and how well your heart's chambers and valves are working. In this test, a transducer is attached to the end of a flexible tube that's guided down your throat and into your esophagus (the tube leading from you mouth to your stomach) to get a more detailed image of your heart. You are not awake for the procedure. Please see the instruction sheet given to you today. For further information please visit [email or web address]  Follow-Up: At [facility], you and your health needs are our priority.  As part of our continuing mission to provide you with exceptional heart care, we have created designated Provider Care Teams.  These Care Teams include your primary Cardiologist (physician) and Advanced Practice Providers (APPs -  Physician Assistants and Nurse Practitioners) who all work together to provide you with the care you need, when you need it.  We recommend signing up for the patient portal called "MyChart".  Sign up information is provided on this After Visit Summary.  MyChart is used to connect with patients for Virtual Visits (Telemedicine).  Patients are able to view lab/test results, encounter notes, upcoming appointments, etc.  Non-urgent messages can be sent to your provider as well.   To learn more  about what you can do with MyChart, go to [email or web address].    Your next appointment:   Structural Team will follow-up  Provider:   Tonny Bollman, MD   Other Instructions    Dear  Lucia Bitter  You are scheduled for a TEE (Transesophageal Echocardiogram) on Friday, November 29 with Dr. Flora Lipps.  Please arrive at the [facility] (Main Entrance A) at [facility]: [address] [place], [place] [address] at 12:15pm (This time is one hour(s) before your procedure to ensure your preparation).   Free valet parking service is available. You will check in at ADMITTING.   *Please Note: You will receive a call the day before your procedure to confirm the appointment time. That time may have changed from the original time based on the schedule for that day.*   DIET:  Nothing to eat or drink after midnight except a sip of water with medications (see medication instructions below)  MEDICATION INSTRUCTIONS: !!IF ANY NEW MEDICATIONS ARE STARTED AFTER TODAY, PLEASE NOTIFY YOUR PROVIDER AS SOON AS POSSIBLE!!  FYI: Medications such as Semaglutide (Ozempic, [place]), Tirzepatide (Mounjaro, Zepbound), Dulaglutide (Trulicity), etc ("GLP1 agonists") AND Canagliflozin (Invokana), Dapagliflozin (Farxiga), Empagliflozin (Jardiance), Ertugliflozin (Steglatro), Bexagliflozin [employer]) or any combination with one of these drugs such as Invokamet (Canagliflozin/Metformin), Synjardy (Empagliflozin/Metformin), etc ("SGLT2 inhibitors") must be held around the time of a procedure. This is not a comprehensive list of all of these drugs. Please review all of your medications and talk to your provider if you take any one of these. If you are not sure, ask your provider.   LABS: TODAY  FYI:  Your support person will be asked to wait in the waiting room during your procedure.  It is OK to have someone drop you off and come back when you are ready to be discharged.  You cannot drive after the procedure and will need someone to drive you home.  Bring your insurance cards.  *Special Note: Every effort is made to have your procedure done on time. Occasionally there are emergencies that occur at the  hospital that may cause delays. Please be patient if a delay does occur.       Signed, Tonny Bollman, MD  12/18/2022 12:52 PM    [facility]

## 2022-12-19 LAB — BASIC METABOLIC PANEL
BUN/Creatinine Ratio: 17 (ref 10–24)
BUN: 16 mg/dL (ref 8–27)
CO2: 29 mmol/L (ref 20–29)
Calcium: 9.5 mg/dL (ref 8.6–10.2)
Chloride: 104 mmol/L (ref 96–106)
Creatinine, Ser: 0.96 mg/dL (ref 0.76–1.27)
Glucose: 168 mg/dL — ABNORMAL HIGH (ref 70–99)
Potassium: 5 mmol/L (ref 3.5–5.2)
Sodium: 141 mmol/L (ref 134–144)
eGFR: 76 mL/min/{1.73_m2} (ref >59)

## 2022-12-19 LAB — CBC
Hematocrit: 41.6 % (ref 37.5–51.0)
Hemoglobin: 13.6 g/dL (ref 13.0–17.7)
MCH: 31.6 pg (ref 26.6–33.0)
MCHC: 32.7 g/dL (ref 31.5–35.7)
MCV: 97 fL (ref 79–97)
Platelets: 124 10*3/uL — ABNORMAL LOW (ref 150–450)
RBC: 4.31 x10E6/uL (ref 4.14–5.80)
RDW: 12 % (ref 11.6–15.4)
WBC: 7.8 10*3/uL (ref 3.4–10.8)

## 2022-12-22 ENCOUNTER — Encounter: Payer: Self-pay | Admitting: Podiatry

## 2022-12-22 ENCOUNTER — Ambulatory Visit (INDEPENDENT_AMBULATORY_CARE_PROVIDER_SITE_OTHER): Payer: Medicare Other | Admitting: Podiatry

## 2022-12-22 VITALS — Ht 69.0 in | Wt 157.0 lb

## 2022-12-22 DIAGNOSIS — M79676 Pain in unspecified toe(s): Secondary | ICD-10-CM | POA: Diagnosis not present

## 2022-12-22 DIAGNOSIS — M2011 Hallux valgus (acquired), right foot: Secondary | ICD-10-CM | POA: Diagnosis not present

## 2022-12-22 DIAGNOSIS — M2012 Hallux valgus (acquired), left foot: Secondary | ICD-10-CM

## 2022-12-22 DIAGNOSIS — B351 Tinea unguium: Secondary | ICD-10-CM | POA: Diagnosis not present

## 2022-12-22 DIAGNOSIS — E119 Type 2 diabetes mellitus without complications: Secondary | ICD-10-CM | POA: Diagnosis not present

## 2022-12-22 DIAGNOSIS — E1159 Type 2 diabetes mellitus with other circulatory complications: Secondary | ICD-10-CM

## 2022-12-27 NOTE — Progress Notes (Signed)
Unable to reach patient about procedure, but was able to leave a detailed message. Stated that the patient needed to arrive at the hospital at 1030 , remain NPO after 0000, needs to have a ride home and a responsible adult to stay with them for 24 hours after the procedure. Instructed the patient to call back if they had any questions.

## 2022-12-29 ENCOUNTER — Encounter (HOSPITAL_COMMUNITY): Payer: Self-pay | Admitting: Cardiovascular Disease

## 2022-12-29 ENCOUNTER — Ambulatory Visit (HOSPITAL_COMMUNITY)
Admission: RE | Admit: 2022-12-29 | Discharge: 2022-12-29 | Disposition: A | Discharge status: Home / Self Care | Payer: Medicare Other | Attending: Cardiovascular Disease | Admitting: Cardiovascular Disease

## 2022-12-29 ENCOUNTER — Ambulatory Visit (HOSPITAL_COMMUNITY): Payer: Medicare Other | Admitting: Anesthesiology

## 2022-12-29 ENCOUNTER — Encounter (HOSPITAL_COMMUNITY)
Admission: RE | Disposition: A | Discharge status: Home / Self Care | Payer: Self-pay | Source: Home / Self Care | Attending: Cardiovascular Disease

## 2022-12-29 ENCOUNTER — Other Ambulatory Visit: Payer: Self-pay

## 2022-12-29 ENCOUNTER — Ambulatory Visit (HOSPITAL_BASED_OUTPATIENT_CLINIC_OR_DEPARTMENT_OTHER)
Admission: RE | Admit: 2022-12-29 | Discharge: 2022-12-29 | Disposition: A | Discharge status: Home / Self Care | Payer: Medicare Other | Source: Ambulatory Visit | Attending: Cardiovascular Disease

## 2022-12-29 DIAGNOSIS — I340 Nonrheumatic mitral (valve) insufficiency: Secondary | ICD-10-CM | POA: Insufficient documentation

## 2022-12-29 DIAGNOSIS — F0390 Unspecified dementia without behavioral disturbance: Secondary | ICD-10-CM | POA: Diagnosis not present

## 2022-12-29 DIAGNOSIS — Z79899 Other long term (current) drug therapy: Secondary | ICD-10-CM | POA: Insufficient documentation

## 2022-12-29 DIAGNOSIS — I2510 Atherosclerotic heart disease of native coronary artery without angina pectoris: Secondary | ICD-10-CM | POA: Diagnosis not present

## 2022-12-29 DIAGNOSIS — Z952 Presence of prosthetic heart valve: Secondary | ICD-10-CM | POA: Insufficient documentation

## 2022-12-29 DIAGNOSIS — I083 Combined rheumatic disorders of mitral, aortic and tricuspid valves: Secondary | ICD-10-CM | POA: Diagnosis present

## 2022-12-29 DIAGNOSIS — K219 Gastro-esophageal reflux disease without esophagitis: Secondary | ICD-10-CM | POA: Insufficient documentation

## 2022-12-29 DIAGNOSIS — Z7984 Long term (current) use of oral hypoglycemic drugs: Secondary | ICD-10-CM | POA: Diagnosis not present

## 2022-12-29 DIAGNOSIS — I700 Atherosclerosis of aorta: Secondary | ICD-10-CM | POA: Diagnosis not present

## 2022-12-29 DIAGNOSIS — E785 Hyperlipidemia, unspecified: Secondary | ICD-10-CM | POA: Diagnosis not present

## 2022-12-29 DIAGNOSIS — E119 Type 2 diabetes mellitus without complications: Secondary | ICD-10-CM | POA: Diagnosis not present

## 2022-12-29 DIAGNOSIS — I10 Essential (primary) hypertension: Secondary | ICD-10-CM | POA: Diagnosis not present

## 2022-12-29 DIAGNOSIS — I350 Nonrheumatic aortic (valve) stenosis: Secondary | ICD-10-CM

## 2022-12-29 HISTORY — PX: TRANSESOPHAGEAL ECHOCARDIOGRAM (CATH LAB): EP1270

## 2022-12-29 LAB — ECHO TEE
AR max vel: 1.89 cm2
AV Area VTI: 2.4 cm2
AV Area mean vel: 1.87 cm2
AV Mean grad: 7 mm[Hg]
AV Peak grad: 13.2 mm[Hg]
Ao pk vel: 1.82 m/s
MV M vel: 5.59 m/s
MV Peak grad: 125 mm[Hg]
Radius: 1 cm

## 2022-12-29 LAB — GLUCOSE, CAPILLARY
Glucose-Capillary: 94 mg/dL (ref 70–99)
Glucose-Capillary: 80 mg/dL (ref 70–99)

## 2022-12-29 SURGERY — TRANSESOPHAGEAL ECHOCARDIOGRAM (TEE) (CATHLAB)
Anesthesia: Monitor Anesthesia Care

## 2022-12-29 MED ORDER — SODIUM CHLORIDE 0.9 % IV SOLN
INTRAVENOUS | Status: DC
  Administered 2022-12-29: 20 mL/h via INTRAVENOUS

## 2022-12-29 MED ORDER — PROPOFOL 10 MG/ML IV BOLUS
INTRAVENOUS | Status: DC | PRN
  Administered 2022-12-29 (×2): 20 mg via INTRAVENOUS
  Administered 2022-12-29: 50 mg via INTRAVENOUS

## 2022-12-29 NOTE — Anesthesia Preprocedure Evaluation (Addendum)
Anesthesia Evaluation  Patient identified by MRN, date of birth, ID band Patient awake    Reviewed: Allergy & Precautions, NPO status , Patient's Chart, lab work & pertinent test results, reviewed documented beta blocker date and time   Airway Mallampati: II       Dental  (+) Teeth Intact, Dental Advisory Given, Caps   Pulmonary neg pulmonary ROS   Pulmonary exam normal breath sounds clear to auscultation       Cardiovascular hypertension, Pt. on medications and Pt. 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 Left ventricular ejection fraction, by estimation, is 60 to 65%. The  left ventricle has normal function. The left ventricle has no regional  wall motion abnormalities. Left ventricular diastolic parameters are  consistent with Grade I diastolic  dysfunction (impaired relaxation).   2. Right ventricular systolic function is normal. The right ventricular  size is normal. There is moderately elevated pulmonary artery systolic  pressure. The estimated right ventricular systolic pressure is 45.7 mmHg.   3. Left atrial size was mildly dilated.   4. The mitral valve is normal in structure. Severe mitral valve  regurgitation. No evidence of mitral stenosis. There is moderate  holosystolic prolapse mitral valve, unable to determine leaflet. Moderate  mitral annular calcification.   5. Tricuspid valve regurgitation is  moderate to severe.   6. The aortic valve has been repaired/replaced. Aortic valve  regurgitation is not visualized. No aortic stenosis is present. There is a  Sapien prosthetic (TAVR) valve present in the aortic position. Procedure  Date: 07/29/19. Aortic valve mean gradient  measures 11.0 mmHg.   7. The inferior vena cava is normal in size with greater than 50%  respiratory variability, suggesting right atrial pressure of 3 mmHg.      Neuro/Psych  PSYCHIATRIC DISORDERS     Dementia negative neurological ROS     GI/Hepatic Neg liver ROS,GERD  Medicated,,  Endo/Other  diabetes, Well Controlled, Type 2, Oral Hypoglycemic Agents  Hyperlipidemia  Renal/GU negative Renal ROS  negative genitourinary   Musculoskeletal  (+) Arthritis , Osteoarthritis,    Abdominal   Peds  Hematology Thrombocytopenia- Plt 124k   Anesthesia Other Findings   Reproductive/Obstetrics                             Anesthesia Physical Anesthesia Plan  ASA: 3  Anesthesia Plan: MAC   Post-op Pain Management: Minimal or no pain anticipated   Induction: Intravenous  PONV Risk Score and Plan: 1 and Propofol infusion and Treatment may vary due to age or medical condition  Airway Management Planned: Natural Airway and Nasal Cannula  Additional Equipment: None  Intra-op Plan:   Post-operative Plan:   Informed Consent: I have reviewed the patients History and Physical, chart, labs and discussed the procedure including the risks, benefits and alternatives for the proposed anesthesia with the patient or authorized representative who has indicated his/her understanding and acceptance.     Dental advisory given  Plan Discussed with:  CRNA and Anesthesiologist  Anesthesia Plan Comments:         Anesthesia Quick Evaluation

## 2022-12-29 NOTE — Anesthesia Postprocedure Evaluation (Signed)
Anesthesia Post Note  Patient: Victor [name]  Procedure(s) Performed: TRANSESOPHAGEAL ECHOCARDIOGRAM     Patient location during evaluation: PACU Anesthesia Type: MAC Level of consciousness: awake and alert Pain management: pain level controlled Vital Signs Assessment: post-procedure vital signs reviewed and stable Respiratory status: spontaneous breathing, nonlabored ventilation, respiratory function stable and patient connected to nasal cannula oxygen Cardiovascular status: stable and blood pressure returned to baseline Postop Assessment: no apparent nausea or vomiting Anesthetic complications: no   There were no known notable events for this encounter.  Last Vitals:  Vitals:   12/29/22 1038  BP: 124/69  Pulse: 63  Resp: 14  Temp: 36.6 C  SpO2: 92%    Last Pain:  Vitals:   12/29/22 1038  TempSrc: Temporal                 Mariann Barter

## 2022-12-29 NOTE — Transfer of Care (Signed)
Immediate Anesthesia Transfer of Care Note  Patient: Victor [name]  Procedure(s) Performed: TRANSESOPHAGEAL ECHOCARDIOGRAM  Patient Location: PACU  Anesthesia Type:MAC  Level of Consciousness: awake  Airway & Oxygen Therapy: Patient Spontanous Breathing  Post-op Assessment: Post -op Vital signs reviewed and stable  Post vital signs: stable  Last Vitals:  Vitals Value Taken Time  BP    Temp    Pulse 63 12/29/22 1348  Resp 19 12/29/22 1348  SpO2 99 % 12/29/22 1348  Vitals shown include unfiled device data.  Last Pain:  Vitals:   12/29/22 1038  TempSrc: Temporal         Complications: There were no known notable events for this encounter.

## 2022-12-29 NOTE — CV Procedure (Signed)
    TRANSESOPHAGEAL ECHOCARDIOGRAM   NAME:  Victor [name]    MRN: [id number] DOB:  [birth year]    ADMIT DATE: 12/29/2022  INDICATIONS: Mitral regurgitation   PROCEDURE:   Informed consent was obtained prior to the procedure. The risks, benefits and alternatives for the procedure were discussed and the patient comprehended these risks.  Risks include, but are not limited to, cough, sore throat, vomiting, nausea, somnolence, esophageal and stomach trauma or perforation, bleeding, low blood pressure, aspiration, pneumonia, infection, trauma to the teeth and death.    Procedural time out performed. The oropharynx was anesthetized with topical 1% benzocaine.    Anesthesia was administered by Dr. Ace Gins.  The patient was administered 40 mg of propofol and 90 mg of lidocaine to achieve and maintain moderate conscious sedation.  The patient's heart rate, blood pressure, and oxygen saturation are monitored continuously during the procedure. The period of conscious sedation is 23 minutes, of which I was present face-to-face 100% of this time.   The transesophageal probe was inserted in the esophagus and stomach without difficulty and multiple views were obtained.   COMPLICATIONS:    There were no immediate complications.  KEY FINDINGS:  Severe MR due to flail P3 segment.  Normal LVEF 55-60%.  Full report to follow. Further management per primary team.   Gerri Spore T. Flora Lipps, MD, [facility]  [facility]  [address] [place], [place] [address] [phone]  1:44 PM

## 2022-12-29 NOTE — Progress Notes (Signed)
  Echocardiogram Echocardiogram Transesophageal has been performed.  Delcie Roch 12/29/2022, 2:01 PM

## 2022-12-29 NOTE — Interval H&P Note (Signed)
History and Physical Interval Note:  12/29/2022 11:10 AM  Victor [name]  has presented today for surgery, with the diagnosis of MITRAL REGURGITATION.  The various methods of treatment have been discussed with the patient and family. After consideration of risks, benefits and other options for treatment, the patient has consented to  Procedure(s): TRANSESOPHAGEAL ECHOCARDIOGRAM (N/A) as a surgical intervention.  The patient's history has been reviewed, patient examined, no change in status, stable for surgery.  I have reviewed the patient's chart and labs.  Questions were answered to the patient's satisfaction.    NPO for TEE for MR.  Gerri Spore T. Flora Lipps, MD, [facility]  [facility]  [address] [place], [place] [address] [phone]  11:10 AM

## 2022-12-30 NOTE — Progress Notes (Signed)
ANNUAL DIABETIC FOOT EXAM  Subjective: Victor [name] presents today for annual diabetic foot exam.  Chief Complaint  Patient presents with   Nail Problem    Pt is here for [facility], unsure of last A1C states the DR said it was excellent, PCP is Dr Maryjane Hurter and LOV was in October.   Patient confirms h/o diabetes.  Patient denies any h/o foot wounds.  Marina Goodell, MD is patient's PCP.  Past Medical History:  Diagnosis Date   Angina    Arthritis    BPH (benign prostatic hypertrophy)    CAD S/P percutaneous coronary angioplasty 07/2006   PCI to RCA - Promus DES 2.5 mm x 23 mm; 2D ECHO - EF >55% --> Cath 05/2019 - ~50-60% pRCA with patent stent.  70% ost D1 (small).     Diabetes mellitus    Diverticulitis    s/p colectomy   GERD (gastroesophageal reflux disease)    H/O Severe aortic stenosis 05/2019   Pre-TAVR mean AVA 52.5 mmHg.  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   Diverticulitis    Low back pain 05/24/2018   Knee osteoarthritis 01/11/2017   Primary osteoarthritis of both hands 01/11/2017   Fatigue 07/05/2016   Change in bowel habits 07/06/2015   Balance problem 05/02/2015   Other  symptoms and signs involving the nervous system 05/02/2015   Mild dementia (HCC) 03/16/2015   Symptomatic PVCs; history of 09/14/2014   Restless leg syndrome 08/19/2013   Loss of memory 07/14/2013   Esophageal reflux 05/06/2013   Allergic rhinitis 02/21/2013   Urticaria 02/21/2013   Essential hypertension 03/21/2011   Hyperlipidemia associated with type 2 diabetes mellitus (HCC) 03/21/2011   Diabetes mellitus type II, controlled (HCC) 03/21/2011   BPH (benign prostatic hyperplasia) 03/21/2011   Severe calcific aortic valve stenosis 08/31/2010   CAD S/P percutaneous coronary angioplasty --> PCI RCA Promus DES 2.5 mm x 23 mm 07/31/2006   Atherosclerotic heart disease of native coronary artery without angina pectoris 07/31/2006   Past Surgical History:  Procedure Laterality Date   CARDIAC CATHETERIZATION with PCI  08/27/2006   RCA-mid - 2.5x40mm Promus stent   CATARACT EXTRACTION W/ INTRAOCULAR LENS  IMPLANT, BILATERAL     CATARACT EXTRACTION W/PHACO Right 03/08/2015   Procedure: CATARACT EXTRACTION PHACO AND INTRAOCULAR LENS PLACEMENT (IOC);  Surgeon: Sallee Lange, MD;  Location: [facility] ORS;  Service: Ophthalmology;  Laterality: Right;  [place]: 01:29.4    COLECTOMY  ~ 2000   LEFT HEART CATHETERIZATION WITH CORONARY ANGIOGRAM N/A 03/22/2011   Procedure: LEFT HEART CATHETERIZATION WITH CORONARY ANGIOGRAM;  Surgeon: Marykay Lex, MD;  Location: [facility] CATH LAB;  Service: Cardiovascular::: Patent RCA stent w/ progression of pRCA Dz to ~50-60%. Progression of oD3 lesion to 60-70% - not optimal for PCI (b/c ostial).  EF 55-60% - no RWMA. Med Rx.  Aortic valve gradient: Peak 22 mmHg, mean 13 mmHg   NM MYOVIEW LTD  08/2014   LOW RISK. NORMAL.  EF 45-54%.    RIGHT/LEFT HEART CATH AND CORONARY ANGIOGRAPHY N/A 06/25/2019   Procedure: RIGHT/LEFT HEART CATH AND CORONARY ANGIOGRAPHY;  Surgeon: Marykay Lex, MD;  Location: [facility] INVASIVE CV LAB;  Severe AS (mean gradent 44.2 mmHg). Stable CAD - 50-60%  pRCA with widely patent stent.  Small caliber D2 w/ ost 70%. Normal RHC Pressures.   TEE WITHOUT CARDIOVERSION N/A 07/29/2019   Procedure: TRANSESOPHAGEAL ECHOCARDIOGRAM (TEE);  Surgeon: Kathleene Hazel, MD;  Location: [facility] INVASIVE CV LAB;  Service: Open Heart Surgery;  Laterality: N/A;   TRANSCATHETER AORTIC VALVE REPLACEMENT, TRANSFEMORAL N/A 07/29/2019   Procedure: TRANSCATHETER AORTIC VALVE REPLACEMENT, TRANSFEMORAL;  Surgeon: Kathleene Hazel, MD;  Location: [facility] INVASIVE CV LAB;; R TFA Approach -> Edwards Sapien 3 THV (size 29 mm, model # [id number], serial # [id number])   TRANSTHORACIC ECHOCARDIOGRAM  03/2019   a) 03/2019: calcified aortic valve-severe stenosis.  AVA 0.79 cm.  Mean gradient 42.3 mmHg.  EF 60 to 65%.;; b) 05/2019: Severe/critical AS -V-max 4.84m/s, mean gradient 52-53 mmHg, AVA 0.49 cm. EF 60-65%. No RWMA. Gr 1 DD?Marland Kitchen Mod-Severe LA dilation   TRANSTHORACIC ECHOCARDIOGRAM  07/2019   a) 07/2019: POD #1 TAVR: EF 60 to 65%.  Moderate LA dilation.  Mild to moderate MR.  Well seated, nl fxning  TAVR valve.  Mean gradient 5 mmHg.;; b) 08/2019: EF 60 to 65%.  Normal RV function.  GR 1 DD.  No R WMA.  Moderate LA dilation.  Mild to moderate MR.  Mild to moderate TR.  Stable Edwards Sapien 29 mmTAVR valve in aortic position -normal function.  Mean gradient 8 mmHg.   TRANSTHORACIC ECHOCARDIOGRAM  08/18/2020   29 mm Sapien bioprosthetic TAVR well-positioned.  No PVL.  Mean gradient 9 mmHg-no residual AS.  Normal LV function with EF 55 to 60%.  No R WMA.  Mild concentric LVH.  GR 1 DD with mild LA and RA dilation.  Normal RV size and function.  Normal RVP/RAP.  Mild MV degeneration but no MR or MS.   TRANSURETHRAL RESECTION OF PROSTATE  ~ 2010   Current Outpatient Medications on File Prior to Visit  Medication Sig Dispense Refill   acetaminophen (TYLENOL) 500 MG tablet Take 500 mg by mouth every 6 (six) hours as needed for moderate pain or headache.     ascorbic acid (VITAMIN C) 500  MG tablet Take 1,000-1,500 mg by mouth See admin instructions. Take 1000 mg in the morning and 1500 mg at night     aspirin EC 81 MG tablet Take 81 mg by mouth daily.     benzonatate (TESSALON) 200 MG capsule Take 200 mg by mouth 3 (three) times daily as needed for cough.     bismuth subsalicylate (PEPTO BISMOL) 262 MG/15ML suspension Take 30 mLs by mouth every 6 (six) hours as needed for indigestion or diarrhea or loose stools.     cetirizine (ZYRTEC) 10 MG tablet Take 10 mg by mouth daily.     Cholecalciferol (VITAMIN D3) 2000 UNITS TABS Take 4,000 Units by mouth 2 (two) times daily.      CINNAMON PO Take 1,000 mg by mouth 2 (  two) times daily.     clindamycin (CLEOCIN) 300 MG capsule Take 2 capsules (600 mg) 1 hour prior to all dental visits. 8 capsule 2   Coenzyme Q10 (COQ-10) 100 MG CAPS Take 100 mg by mouth daily.     Cyanocobalamin (B-12) 2500 MCG TABS Take 2,500 mcg by mouth daily.     divalproex (DEPAKOTE) 125 MG DR tablet Take 125 mg by mouth 2 (two) times daily.     donepezil (ARICEPT) 10 MG tablet Take 10 mg by mouth daily.     EPIPEN 2-PAK 0.3 MG/0.3ML SOAJ injection Inject 0.3 mLs as directed as needed for anaphylaxis.      ezetimibe (ZETIA) 10 MG tablet Take 10 mg by mouth daily.     fluticasone (FLONASE) 50 MCG/ACT nasal spray Place 2 sprays into both nostrils 2 (two) times daily as needed for allergies.      gabapentin (NEURONTIN) 100 MG capsule Take 300 mg by mouth at bedtime.      glipiZIDE (GLUCOTROL XL) 5 MG 24 hr tablet Take 5 mg by mouth daily with breakfast.     Glucosamine HCl 1000 MG TABS Take 1,000 mg by mouth 2 (two) times daily.     ipratropium (ATROVENT) 0.03 % nasal spray Place 2 sprays into both nostrils 2 (two) times daily as needed for rhinitis.     loperamide (IMODIUM A-D) 2 MG tablet Take 2 mg by mouth 2 (two) times daily.     meloxicam (MOBIC) 15 MG tablet Take 15 mg by mouth daily as needed for pain.     memantine (NAMENDA) 5 MG tablet Take 5 mg by mouth  daily.     mirtazapine (REMERON) 15 MG tablet Take 15 mg by mouth at bedtime.     Multiple Vitamin (MULITIVITAMIN WITH MINERALS) TABS Take 1 tablet by mouth 2 (two) times daily.      Omega 3 1200 MG CAPS Take 2,400 mg by mouth 2 (two) times daily.      Polyethyl Glycol-Propyl Glycol (SYSTANE) 0.4-0.3 % SOLN Place 1 drop into both eyes daily.     Probiotic CAPS Take 1 capsule by mouth daily.     rosuvastatin (CRESTOR) 20 MG tablet TAKE 2 TABLETS BY MOUTH ON MONDAYS, WEDNESDAYS, AND FRIDAYS AND 1 TABLET ON SATURDAY, SUNDAY, TUESDAY AND THURSDAY 30 tablet 1   Saw Palmetto 450 MG CAPS Take 900 mg by mouth daily.      triamcinolone cream (KENALOG) 0.1 % Apply 1 application topically 3 (three) times daily as needed for itching.     zinc gluconate 50 MG tablet Take 50 mg by mouth daily.     No current facility-administered medications on file prior to visit.    Allergies  Allergen Reactions   Azithromycin Rash and Anaphylaxis   Cephalosporins Anaphylaxis and Other (See Comments)   Codeine Anaphylaxis, Itching and Rash   Doxycycline Anaphylaxis   Phenylephrine-Guaifenesin     Unknown reaction    Pseudoephedrine Other (See Comments)    unknown    Amoxicillin Rash   Levofloxacin Rash   Penicillins Rash    Other reaction(s): Unknown   Sulfa Antibiotics Rash   Sulfasalazine Rash   Social History   Occupational History   Occupation: Catering manager  Tobacco Use   Smoking status: Never   Smokeless tobacco: Former    Types: Chew    Quit date: 01/31/1996  Vaping Use   Vaping status: Never Used  Substance and Sexual Activity   Alcohol use: Not Currently  Alcohol/week: 1.0 standard drink of alcohol    Types: 1 Glasses of wine per week    Comment: GLASS OF WINE A MONTH   Drug use: No   Sexual activity: Yes   Family History  Problem Relation Age of Onset   ALS Mother    Prostate cancer Father    Immunization History  Administered Date(s) Administered   Influenza Split  12/01/2014   Influenza,inj,Quad PF,6+ Mos 11/28/2019   Influenza-Unspecified 10/26/2011, 11/08/2012, 11/27/2013, 12/01/2014, 11/12/2015, 10/25/2016   PFIZER Comirnaty(Gray Top)Covid-19 Tri-Sucrose Vaccine 05/09/2019, 05/30/2019   PFIZER(Purple Top)SARS-COV-2 Vaccination 05/09/2019, 05/30/2019   Pneumococcal Polysaccharide-23 01/31/1999   Tdap 09/06/2016     Review of Systems: Negative except as noted in the HPI.   Objective: There were no vitals filed for this visit.  Seth L Kilpela is a pleasant 89 y.o. male in NAD. AAO X 3.  Title   Diabetic Foot Exam - detailed Date & Time: 12/22/2022 10:00 AM Diabetic Foot exam was performed with the following findings: Yes  Visual Foot Exam completed.: Yes  Is there a history of foot ulcer?: No Is there a foot ulcer now?: No Is there swelling?: No Is there elevated skin temperature?: No Is there abnormal foot shape?: Yes (Comment: HAV with bunion deformity b/l) Is there a claw toe deformity?: No Are the toenails long?: Yes Are the toenails thick?: Yes Are the toenails ingrown?: No Is the skin thin, fragile, shiny and hairless?": No Normal Range of Motion?: Yes Is there foot or ankle muscle weakness?: No Do you have pain in calf while walking?: No Are the shoes appropriate in style and fit?: Yes Can the patient see the bottom of their feet?: No Pulse Foot Exam completed.: Yes   Right Posterior Tibialis: Diminished Left posterior Tibialis: Diminished   Right Dorsalis Pedis: Diminished Left Dorsalis Pedis: Diminished     Sensory Foot Exam Completed.: Yes Semmes-Weinstein Monofilament Test "+" means "has sensation" and "-" means "no sensation"  R Foot Test Control: Pos L Foot Test Control: Pos   R Site 1-Great Toe: Pos L Site 1-Great Toe: Pos   R Site 4: Pos L Site 4: Pos   R site 5: Pos L Site 5: Pos  R Site 6: Pos L Site 6: Pos     Image components are not supported.   Image components are not supported. Image components are  not supported.  Tuning [place] Right vibratory: present Left vibratory: present  Comments     Lab Results  Component Value Date   HGBA1C 6.4 (H) 07/25/2019   ADA Risk Categorization: Low Risk :  Patient has all of the following: Intact protective sensation No prior foot ulcer  No severe deformity Pedal pulses present  Assessment: 1. Pain due to onychomycosis of toenail   2. Hallux valgus, acquired, bilateral   3. Type 2 diabetes mellitus with vascular disease (HCC)   4. Encounter for diabetic foot exam (HCC)     Plan:  -Consent given for treatment as described below: -Examined patient. -Diabetic foot examination performed today. -Continue diabetic foot care principles: inspect feet daily, monitor glucose as recommended by PCP and/or Endocrinologist, and follow prescribed diet per PCP, Endocrinologist and/or dietician. -Patient to continue soft, supportive shoe gear daily. -Toenails 1-5 b/l were debrided in length and girth with sterile nail nippers and dremel without iatrogenic bleeding.  -Patient/POA to call should there be question/concern in the interim. Return in about 4 months (around 04/21/2023).  Freddie Breech, DPM  [place] LOCATION: [address] [place] [address]                   Office [phone]   [facility] LOCATION: [address] [place], [place] [address] Office [phone]

## 2023-01-01 ENCOUNTER — Encounter (HOSPITAL_COMMUNITY): Payer: Self-pay | Admitting: Cardiovascular Disease

## 2023-01-01 ENCOUNTER — Telehealth: Payer: Self-pay

## 2023-01-01 ENCOUNTER — Ambulatory Visit: Payer: Medicare Other | Admitting: Cardiology

## 2023-01-01 NOTE — Telephone Encounter (Signed)
Katy let's review his TEE tomorrow. Probably need to refer to Dr Leafy Ro for surgical eval and plan on TEER if he wants to be treated. He won't be a surgical candidate. thx

## 2023-01-01 NOTE — Telephone Encounter (Signed)
Abbott review of 12/29/2022 TEE:  "For patient WW: This is Primary MR   This valve appears to suitable for a MitraClip implant. In the Bicaval and SAXB views, the fossa looks reasonable for a transseptal puncture. TR is noted. LA dimensions are large enough for device steering and straddle. P2 is prolapsed and has a flail. Both A3 and P3 are both prolapsed. MAC is noted. The posterior leaflet is 16 mm by MPR. Gradient measures 2 mmHg (63 BPM); MVA measures 6.1 cm2. Based on this information, I'd recommend starting with an NTW at A3/P3 and assess for gradient with a possible next clip (XTW) at A2/P2."

## 2023-01-01 NOTE — Telephone Encounter (Signed)
Per Edwards review of 12/29/2022 TEE, "Large prolapsing/flail segment across medial P2 to P3 region Anteriorly directed jet originating from P2-P3 region Suggess 1 ACEs medial A2/P2 and A3/P3"

## 2023-01-04 ENCOUNTER — Telehealth: Payer: Self-pay

## 2023-01-04 NOTE — Telephone Encounter (Signed)
Reviewed the patient's TEE during Valve Team meeting this week and the patient's anatomy is suitable for mTEER.  Per Dr. Excell Seltzer, scheduled the patient for follow-up to review results and to make a plan going forward. The patient is scheduled 01/08/2023. His wife was grateful for call and agreed with plan.

## 2023-01-08 ENCOUNTER — Encounter: Payer: Self-pay | Admitting: Cardiovascular Disease

## 2023-01-08 ENCOUNTER — Ambulatory Visit: Payer: Medicare Other | Attending: Cardiovascular Disease | Admitting: Cardiovascular Disease

## 2023-01-08 VITALS — BP 114/62 | HR 64 | Ht 68.0 in | Wt 158.2 lb

## 2023-01-08 DIAGNOSIS — I340 Nonrheumatic mitral (valve) insufficiency: Secondary | ICD-10-CM | POA: Insufficient documentation

## 2023-01-08 NOTE — Patient Instructions (Signed)
Testing/Procedures: ECHO (in 6 months) Your physician has requested that you have an echocardiogram. Echocardiography is a painless test that uses sound waves to create images of your heart. It provides your doctor with information about the size and shape of your heart and how well your heart's chambers and valves are working. This procedure takes approximately one hour. There are no restrictions for this procedure. Please do NOT wear cologne, perfume, aftershave, or lotions (deodorant is allowed). Please arrive 15 minutes prior to your appointment time.  Please note: We ask at that you not bring children with you during ultrasound (echo/ vascular) testing. Due to room size and safety concerns, children are not allowed in the ultrasound rooms during exams. Our front office staff cannot provide observation of children in our lobby area while testing is being conducted. An adult accompanying a patient to their appointment will only be allowed in the ultrasound room at the discretion of the ultrasound technician under special circumstances. We apologize for any inconvenience.  Follow-Up: At [facility], you and your health needs are our priority.  As part of our continuing mission to provide you with exceptional heart care, we have created designated Provider Care Teams.  These Care Teams include your primary Cardiologist (physician) and Advanced Practice Providers (APPs -  Physician Assistants and Nurse Practitioners) who all work together to provide you with the care you need, when you need it.  We recommend signing up for the patient portal called "MyChart".  Sign up information is provided on this After Visit Summary.  MyChart is used to connect with patients for Virtual Visits (Telemedicine).  Patients are able to view lab/test results, encounter notes, upcoming appointments, etc.  Non-urgent messages can be sent to your provider as well.   To learn more about what you can do with MyChart,  go to [email or web address].    Your next appointment:   6 month(s)  Provider:   Tonny Bollman, MD

## 2023-01-08 NOTE — Progress Notes (Signed)
Cardiology Office Note:    Date:  01/19/2023   ID:  Victor [name], Victor [name] [birth year], MRN [id number]  PCP:  Marina Goodell, MD   [facility] Providers Cardiologist:  Bryan Lemma, MD     Referring MD: Marina Goodell, MD   Chief Complaint  Patient presents with   Follow-up    Mitral Regurgitation    History of Present Illness:    Victor [name] is a 89 y.o. male presents for follow-up of mitral regurgitation.  The patient has a history of valvular heart disease with severe aortic stenosis and he underwent TAVR without complication in 2021.  He has complained of progressive fatigue and was found to have a prominent murmur on exam, prompting an echocardiogram.  This demonstrated worsening mitral regurgitation and I saw the patient initially in consultation last month.  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  He returns today for follow-up discussion of treatment options.  The patient is here with his wife today.  He continues to complain of fatigue.  He really has no other symptoms and denies chest pain, chest pressure, shortness of breath, orthopnea, or PND.  He has no leg or abdominal swelling.  He has fairly marked fatigue.   Current Medications: Current Meds  Medication Sig   acetaminophen (TYLENOL) 500 MG tablet Take 500 mg by mouth every 6 (six) hours as needed for moderate pain or headache.   ascorbic acid (VITAMIN C) 500 MG tablet Take 1,000-1,500 mg by mouth See admin instructions. Take 1000 mg in the morning and 1500 mg at night   aspirin EC 81 MG tablet Take 81 mg by mouth daily.   benzonatate (TESSALON) 200 MG capsule Take 200 mg by mouth 3 (three) times daily as needed for cough.   bismuth subsalicylate (PEPTO BISMOL) 262 MG/15ML suspension Take 30 mLs by mouth every 6 (six) hours as needed for indigestion or diarrhea or loose stools.    cetirizine (ZYRTEC) 10 MG tablet Take 10 mg by mouth as needed.   Cholecalciferol (VITAMIN D3) 2000 UNITS TABS Take 4,000 Units by mouth 2 (two) times daily.    CINNAMON PO Take 1,000 mg by mouth 2 (two) times daily.   clindamycin (CLEOCIN) 300 MG capsule Take 2 capsules (600 mg) 1 hour prior to all dental visits.   Coenzyme Q10 (COQ-10) 100 MG CAPS Take 100 mg by mouth daily.   Cyanocobalamin (B-12) 2500 MCG TABS Take 2,500 mcg by mouth daily.   divalproex (DEPAKOTE) 125 MG DR tablet Take 125 mg by mouth 2 (two) times daily.   donepezil (ARICEPT) 10 MG tablet Take 10 mg by mouth daily.   EPIPEN 2-PAK 0.3 MG/0.3ML SOAJ injection Inject 0.3 mLs as directed as needed for anaphylaxis.    ezetimibe (ZETIA) 10 MG tablet Take 10 mg by mouth daily.   fluticasone (FLONASE) 50 MCG/ACT nasal spray Place 2 sprays into both nostrils 2 (two) times daily as needed for allergies.    gabapentin (NEURONTIN) 100 MG capsule Take 300 mg by mouth at bedtime.    glipiZIDE (GLUCOTROL XL) 5 MG 24 hr tablet Take 5 mg by mouth daily with breakfast.   Glucosamine HCl 1000 MG TABS Take 1,000 mg by mouth 2 (two) times daily.   ipratropium (ATROVENT) 0.03 % nasal spray Place 2 sprays into both nostrils 2 (two) times daily as needed for rhinitis.   loperamide (IMODIUM A-D)  2 MG tablet Take 2 mg by mouth 2 (two) times daily.   meloxicam (MOBIC) 15 MG tablet Take 15 mg by mouth daily as needed for pain.   memantine (NAMENDA) 5 MG tablet Take 5 mg by mouth daily.   mirtazapine (REMERON) 15 MG tablet Take 15 mg by mouth at bedtime.   Multiple Vitamin (MULITIVITAMIN WITH MINERALS) TABS Take 1 tablet by mouth 2 (two) times daily.    Omega 3 1200 MG CAPS Take 2,400 mg by mouth 2 (two) times daily.    Polyethyl Glycol-Propyl Glycol (SYSTANE) 0.4-0.3 % SOLN Place 1 drop into both eyes daily.   Probiotic CAPS Take 1 capsule by mouth daily.   rosuvastatin (CRESTOR) 20 MG tablet TAKE 2 TABLETS BY MOUTH ON MONDAYS, WEDNESDAYS, AND  FRIDAYS AND 1 TABLET ON SATURDAY, SUNDAY, TUESDAY AND THURSDAY   Saw Palmetto 450 MG CAPS Take 900 mg by mouth daily.    zinc gluconate 50 MG tablet Take 50 mg by mouth daily.   [DISCONTINUED] triamcinolone cream (KENALOG) 0.1 % Apply 1 application topically 3 (three) times daily as needed for itching.     Allergies:   Azithromycin, Cephalosporins, Codeine, Doxycycline, Phenylephrine-guaifenesin, Pseudoephedrine, Amoxicillin, Levofloxacin, Penicillins, Sulfa antibiotics, and Sulfasalazine   ROS:   Please see the history of present illness.    All other systems reviewed and are negative.  EKGs/Labs/Other Studies Reviewed:    The following studies were reviewed today: Cardiac Studies & Procedures   CARDIAC CATHETERIZATION  CARDIAC CATHETERIZATION 06/25/2019  Narrative  LV end diastolic pressure is normal.  Right heart cath pressures are normal  There is severe aortic valve stenosis. Mean gradient 44.2 mmHg  Prox RCA lesion is 60% stenosed. Stable from prior cath  Previously placed Prox RCA to Mid RCA stent (unknown type) is widely patent.  2nd Diag lesion is 70% stenosed. 3rd Diag lesion is 50% stenosed. -Both lesions are not favorable for PCI. Also stable.  SUMMARY  Confirmation of severe aortic stenosis with mean gradient 44.2 mmHg.  Peak to peak 45 mmHg. (By echo was 52 mmHg)  Stable coronary arteries with 50 to 60% proximal RCA followed by brief ectatic segment and then widely patent mid stent.  Otherwise small caliber 1st Diag ~70% ostial stenosis.  Normal right heart cath numbers.   Will refer to valve clinic for TAVR evaluation.    Bryan Lemma, MD  Findings Coronary Findings Diagnostic  Dominance: Right  Left Main Vessel was injected. Vessel is large. Vessel is angiographically normal.  Left Anterior Descending Vessel is angiographically normal. The vessel is tortuous.  First Diagonal Branch Vessel is large in size. Vessel is angiographically  normal.  Second Diagonal Branch Vessel is small in size. 2nd Diag lesion is 70% stenosed. The lesion is focal, discrete and concentric.  Third Diagonal Branch Vessel is moderate in size. 3rd Diag lesion is 50% stenosed. The lesion is focal, discrete and concentric.  Left Circumflex Vessel is large.  First Obtuse Marginal Branch Vessel is moderate in size. Vessel is angiographically normal.  Second Obtuse Marginal Branch Vessel is large in size. Vessel is angiographically normal. The vessel is tortuous.  First Left Posterolateral Branch Vessel is moderate in size.  Right Coronary Artery The vessel is mildly tortuous. The vessel is mildly ectatic. Focal ectasia after focal proximal stenosis Prox RCA lesion is 60% stenosed. The lesion is located at the bend, focal and discrete. Followed by Po stenotic ectasia Previously placed Prox RCA to Mid RCA stent (unknown type) is widely  patent.  Right Ventricular Branch Vessel is small in size.  Right Posterior Atrioventricular Artery Vessel is small in size.  Intervention  No interventions have been documented.   STRESS TESTS  NM MYOCAR MULTI W/SPECT W 09/22/2014  Narrative  The study is normal.  This is a low risk study.  The left ventricular ejection fraction is mildly decreased (45-54%).  Pharmacological myocardial perfusion imaging study with no significant  ischemia Normal wall motion, EF estimated at 46% No EKG changes concerning for ischemia. Low risk scan  ECHOCARDIOGRAM  ECHOCARDIOGRAM COMPLETE 12/13/2022  Narrative ECHOCARDIOGRAM REPORT    Patient Name:   ZECHARY WASKEY Date of Exam: 12/13/2022 Medical Rec #:  [id number]      Height:       68.0 in Accession #:    [id number]     Weight:       158.0 lb Date of Birth:  03-Jan-1932      BSA:          1.849 m Patient Age:    89 years       BP:           122/60 mmHg Patient Gender: M              HR:           68 bpm. Exam Location:  [place]  Procedure:  2D Echo, Cardiac Doppler and Color Doppler  Indications:    I35.8 Other nonrheumatic aortic valve disorders  History:        Patient has prior history of Echocardiogram examinations, most recent 11/17/2021. CAD, Aortic Valve Disease and Mitral Valve Disease; Risk Factors:Hypertension and Diabetes. Aortic Valve: Sapien prosthetic, stented (TAVR) valve is present in the aortic position. Procedure Date: 07/29/19.  Sonographer:    Quentin Ore RDMS, RVT, RDCS Referring Phys: [id number] DAVID W HARDING  IMPRESSIONS   1. Left ventricular ejection fraction, by estimation, is 60 to 65%. The left ventricle has normal function. The left ventricle has no regional wall motion abnormalities. Left ventricular diastolic parameters are consistent with Grade I diastolic dysfunction (impaired relaxation). 2. Right ventricular systolic function is normal. The right ventricular size is normal. There is moderately elevated pulmonary artery systolic pressure. The estimated right ventricular systolic pressure is 45.7 mmHg. 3. Left atrial size was mildly dilated. 4. The mitral valve is normal in structure. Severe mitral valve regurgitation. No evidence of mitral stenosis. There is moderate holosystolic prolapse mitral valve, unable to determine leaflet. Moderate mitral annular calcification. 5. Tricuspid valve regurgitation is moderate to severe. 6. The aortic valve has been repaired/replaced. Aortic valve regurgitation is not visualized. No aortic stenosis is present. There is a Sapien prosthetic (TAVR) valve present in the aortic position. Procedure Date: 07/29/19. Aortic valve mean gradient measures 11.0 mmHg. 7. The inferior vena cava is normal in size with greater than 50% respiratory variability, suggesting right atrial pressure of 3 mmHg.  FINDINGS Left Ventricle: Left ventricular ejection fraction, by estimation, is 60 to 65%. The left ventricle has normal function. The left ventricle has no regional wall motion  abnormalities. The left ventricular internal cavity size was normal in size. There is no left ventricular hypertrophy. Left ventricular diastolic parameters are consistent with Grade I diastolic dysfunction (impaired relaxation).  Right Ventricle: The right ventricular size is normal. No increase in right ventricular wall thickness. Right ventricular systolic function is normal. There is moderately elevated pulmonary artery systolic pressure. The tricuspid regurgitant velocity is 3.19 m/s, and with an assumed right  atrial pressure of 5 mmHg, the estimated right ventricular systolic pressure is 45.7 mmHg.  Left Atrium: Left atrial size was mildly dilated.  Right Atrium: Right atrial size was normal in size.  Pericardium: There is no evidence of pericardial effusion.  Mitral Valve: The mitral valve is normal in structure. There is moderate holosystolic prolapse of both leaflets of the mitral valve. There is mild calcification of the mitral valve leaflet(s). Moderate mitral annular calcification. Severe mitral valve regurgitation. No evidence of mitral valve stenosis.  Tricuspid Valve: The tricuspid valve is normal in structure. Tricuspid valve regurgitation is moderate to severe. No evidence of tricuspid stenosis.  Aortic Valve: The aortic valve has been repaired/replaced. Aortic valve regurgitation is not visualized. No aortic stenosis is present. Aortic valve mean gradient measures 11.0 mmHg. Aortic valve peak gradient measures 19.9 mmHg. Aortic valve area, by VTI measures 1.66 cm. There is a Sapien prosthetic, stented (TAVR) valve present in the aortic position. Procedure Date: 07/29/19.  Pulmonic Valve: The pulmonic valve was normal in structure. Pulmonic valve regurgitation is mild. No evidence of pulmonic stenosis.  Aorta: The aortic root is normal in size and structure.  Venous: The inferior vena cava is normal in size with greater than 50% respiratory variability, suggesting right  atrial pressure of 3 mmHg.  IAS/Shunts: No atrial level shunt detected by color flow Doppler.   LEFT VENTRICLE PLAX 2D LVIDd:         5.90 cm      Diastology LVIDs:         3.20 cm      LV e' medial:    6.96 cm/s LV PW:         1.00 cm      LV E/e' medial:  16.0 LV IVS:        0.90 cm      LV e' lateral:   6.20 cm/s LVOT diam:     2.00 cm      LV E/e' lateral: 18.0 LV SV:         78 LV SV Index:   42 LVOT Area:     3.14 cm  LV Volumes (MOD) LV vol d, MOD A2C: 96.9 ml LV vol d, MOD A4C: 123.0 ml LV vol s, MOD A2C: 42.0 ml LV vol s, MOD A4C: 48.9 ml LV SV MOD A2C:     54.9 ml LV SV MOD A4C:     123.0 ml LV SV MOD BP:      64.7 ml  RIGHT VENTRICLE             IVC RV S prime:     17.60 cm/s  IVC diam: 1.40 cm TAPSE (M-mode): 2.7 cm  LEFT ATRIUM           Index        RIGHT ATRIUM           Index LA diam:      4.40 cm 2.38 cm/m   RA Area:     21.00 cm LA Vol (A4C): 84.7 ml 45.81 ml/m  RA Volume:   60.30 ml  32.61 ml/m AORTIC VALVE                     PULMONIC VALVE AV Area (Vmax):    1.71 cm      PV Vmax:       1.33 m/s AV Area (Vmean):   1.65 cm      PV Peak grad:  7.0  mmHg AV Area (VTI):     1.66 cm AV Vmax:           223.00 cm/s AV Vmean:          150.400 cm/s AV VTI:            0.469 m AV Peak Grad:      19.9 mmHg AV Mean Grad:      11.0 mmHg LVOT Vmax:         121.33 cm/s LVOT Vmean:        79.200 cm/s LVOT VTI:          0.248 m LVOT/AV VTI ratio: 0.53  AORTA Ao Root diam: 3.40 cm Ao Asc diam:  3.20 cm Ao Arch diam: 2.5 cm  MITRAL VALVE                TRICUSPID VALVE MV Area (PHT): 4.17 cm     TR Peak grad:   40.7 mmHg MV Decel Time: 182 msec     TR Vmax:        319.00 cm/s MV E velocity: 111.50 cm/s MV A velocity: 140.00 cm/s  SHUNTS MV E/A ratio:  0.80         Systemic VTI:  0.25 m Systemic Diam: 2.00 cm  Julien Nordmann MD Electronically signed by Julien Nordmann MD Signature Date/Time: 12/13/2022/6:07:54 PM    Final  TEE  ECHO TEE  12/29/2022  Narrative TRANSESOPHOGEAL ECHO REPORT    Patient Name:   Victor [name] Date of Exam: 12/29/2022 Medical Rec #:  [id number]      Height:       69.0 in Accession #:    [id number]     Weight:       157.0 lb Date of Birth:  Nov 05, 1931      BSA:          1.864 m Patient Age:    89 years       BP:           125/69 mmHg Patient Gender: M              HR:           73 bpm. Exam Location:  Outpatient  Procedure: Transesophageal Echo, Cardiac Doppler, Color Doppler and 3D Echo  Indications:     mitral regurgitation  History:         Patient has prior history of Echocardiogram examinations, most recent 12/13/2022. CAD; Risk Factors:Diabetes, Hypertension and Dyslipidemia. Aortic Valve: 29 mm Edwards valve is present in the aortic position. Procedure Date: 07/29/19.  Sonographer:     Lucendia Herrlich Sonographer#2:   Delcie Roch RDCS Referring Phys:  [id number] Ronnald Ramp O'NEAL Diagnosing Phys: Lennie Odor MD  PROCEDURE: After discussion of the risks and benefits of a TEE, an informed consent was obtained from the patient. TEE procedure time was 23 minutes. The transesophogeal probe was passed without difficulty through the esophogus of the patient. Imaged were obtained with the patient in a left lateral decubitus position. Sedation performed by different physician. The patient was monitored while under deep sedation. Anesthestetic sedation was provided intravenously by Anesthesiology: 90mg  of Propofol. Image quality was excellent. The patient's vital signs; including heart rate, blood pressure, and oxygen saturation; remained stable throughout the procedure. The patient developed no complications during the procedure.  IMPRESSIONS   1. Severe mitral regurgitation with P3 flail segment. Systolic reversal of flow in the R upper pulmonary vein. 2D ERO 0.43 cm2, R vol  80 cc. 3D VCA 0.47 cm2. MG not measured, but MVA 610 mm2, indicating no stenosis. PMVL length 1.6 cm. The  mitral valve is myxomatous. Severe mitral valve regurgitation. No evidence of mitral stenosis. 2. Left ventricular ejection fraction, by estimation, is 55 to 60%. The left ventricle has normal function. 3. Right ventricular systolic function is normal. The right ventricular size is normal. 4. Left atrial size was moderately dilated. No left atrial/left atrial appendage thrombus was detected. 5. The aortic valve has been repaired/replaced. Aortic valve regurgitation is not visualized. There is a 29 mm Edwards valve present in the aortic position. Procedure Date: 07/29/19. Echo findings are consistent with normal structure and function of the aortic valve prosthesis. Aortic valve area, by VTI measures 2.40 cm. Aortic valve mean gradient measures 7.0 mmHg. Aortic valve Vmax measures 1.82 m/s. 6. There is mild (Grade II) plaque involving the descending aorta and aortic arch. 7. Agitated saline contrast bubble study was negative, with no evidence of any interatrial shunt.  FINDINGS Left Ventricle: Left ventricular ejection fraction, by estimation, is 55 to 60%. The left ventricle has normal function. The left ventricular internal cavity size was normal in size.  Right Ventricle: The right ventricular size is normal. No increase in right ventricular wall thickness. Right ventricular systolic function is normal.  Left Atrium: Left atrial size was moderately dilated. No left atrial/left atrial appendage thrombus was detected.  Right Atrium: Right atrial size was normal in size.  Pericardium: There is no evidence of pericardial effusion.  Mitral Valve: Severe mitral regurgitation with P3 flail segment. Systolic reversal of flow in the R upper pulmonary vein. 2D ERO 0.43 cm2, R vol 80 cc. 3D VCA 0.47 cm2. MG not measured, but MVA 610 mm2, indicating no stenosis. PMVL length 1.6 cm. The mitral valve is myxomatous. Severe mitral valve regurgitation. No evidence of mitral valve stenosis.  Tricuspid Valve:  The tricuspid valve is grossly normal. Tricuspid valve regurgitation is mild . No evidence of tricuspid stenosis.  Aortic Valve: The aortic valve has been repaired/replaced. Aortic valve regurgitation is not visualized. Aortic valve mean gradient measures 7.0 mmHg. Aortic valve peak gradient measures 13.2 mmHg. Aortic valve area, by VTI measures 2.40 cm. There is a 29 mm Edwards valve present in the aortic position. Procedure Date: 07/29/19. Echo findings are consistent with normal structure and function of the aortic valve prosthesis.  Pulmonic Valve: The pulmonic valve was grossly normal. Pulmonic valve regurgitation is trivial. No evidence of pulmonic stenosis.  Aorta: The aortic root and ascending aorta are structurally normal, with no evidence of dilitation. There is mild (Grade II) plaque involving the descending aorta and aortic arch.  IAS/Shunts: The atrial septum is grossly normal. Agitated saline contrast was given intravenously to evaluate for intracardiac shunting. Agitated saline contrast bubble study was negative, with no evidence of any interatrial shunt.  Additional Comments: Spectral Doppler performed.  LEFT VENTRICLE PLAX 2D LVOT diam:     2.50 cm LV SV:         92 LV SV Index:   50 LVOT Area:     4.91 cm   AORTIC VALVE AV Area (Vmax):    1.89 cm AV Area (Vmean):   1.87 cm AV Area (VTI):     2.40 cm AV Vmax:           182.00 cm/s AV Vmean:          125.000 cm/s AV VTI:  0.384 m AV Peak Grad:      13.2 mmHg AV Mean Grad:      7.0 mmHg LVOT Vmax:         70.00 cm/s LVOT Vmean:        47.600 cm/s LVOT VTI:          0.188 m LVOT/AV VTI ratio: 0.49  AORTA Ao Root diam: 2.80 cm  MR Peak grad:    125.0 mmHg   TRICUSPID VALVE MR Mean grad:    79.0 mmHg    TR Peak grad:   26.8 mmHg MR Vmax:         559.00 cm/s  TR Vmax:        259.00 cm/s MR Vmean:        415.0 cm/s MR PISA:         6.28 cm     SHUNTS MR PISA Eff ROA: 43 mm       Systemic VTI:  0.19  m MR PISA Radius:  1.00 cm      Systemic Diam: 2.50 cm  Lennie Odor MD Electronically signed by Lennie Odor MD Signature Date/Time: 12/29/2022/3:04:18 PM    Final   CT SCANS  CT CORONARY MORPH W/CTA COR W/SCORE 07/21/2019  Addendum 07/21/2019  5:49 PM ADDENDUM REPORT: 07/21/2019 17:46  CLINICAL DATA:  Severe Aortic Stenosis.  EXAM: Cardiac TAVR CT  TECHNIQUE: The patient was scanned on a [employer]. A 120 kV retrospective scan was triggered in the descending thoracic aorta at 111 HU's. Gantry rotation speed was 250 msecs and collimation was .6 mm. No beta blockade or nitro were given. The 3D data set was reconstructed in 5% intervals of the R-R cycle. Systolic and diastolic phases were analyzed on a dedicated work station using MPR, MIP and VRT modes. The patient received 80 cc of contrast.  FINDINGS: Image quality: Excellent.  Noise artifact is: Limited.  Valve Morphology: The aortic valve is tricuspid. The NCC/LCC are heavily calcified with bulky calcifications extending to the leaflet bases. Leaflet motion is severely restricted in systole consistent with severe aortic stenosis.  Aortic Valve Calcium score: 5023  Aortic annular dimension:  Phase assessed: 30%  Annular area: 542 mm2  Annular perimeter: 84.7 mm  Max diameter: 30.3 mm  Min diameter: 23.4 mm  Annular and subannular calcification: There is mild annular calcification under the LCC extending into the LVOT and in continuity with the anterior mitral valve annulus.  Optimal coplanar projection: LAO 11 CAU 3  Coronary Artery Height above Annulus:  Left Main: 20.4 mm  Right Coronary: 22.7 mm  Sinus of Valsalva Measurements:  Non-coronary: 35 mm  Right-coronary: 36 mm  Left-coronary: 36 mm  Sinus of Valsalva Height:  Non-coronary: 23.8 mm  Right-coronary: 26.7 mm  Left-coronary: 26.4 mm  Sinotubular Junction: 31 mm  Ascending Thoracic Aorta: 34 mm  Coronary  Arteries: Normal coronary origin. Right dominance. 3-vessel calcifications. The study was performed without use of NTG and is insufficient for plaque evaluation. Please refer to recent cardiac catheterization for coronary assessment.  Cardiac Morphology:  Right Atrium: Right atrial size is within normal limits.  Right Ventricle: The right ventricular cavity is within normal limits.  Left Atrium: Left atrial size is normal in size with no left atrial appendage filling defect.  Left Ventricle: The ventricular cavity size is within normal limits. There are no stigmata of prior infarction. There is no abnormal filling defect. Normal left ventricular function, LVEF 55%. No regional wall motion abnormalities.  Pulmonary arteries: Normal in size without proximal filling defect.  Pulmonary veins: Normal pulmonary venous drainage.  Pericardium: Normal thickness with no significant effusion or calcium present.  Mitral Valve: The mitral valve is degenerative with severe mitral annular calcification.  Extra-cardiac findings: See attached radiology report for non-cardiac structures.  IMPRESSION: 1. Annular measurements (542 mm2) appropriate for 26 mm Edwards Sapien 3 TAVR.  2. There is mild annular calcification under the LCC extending into the LVOT and in continuity with the anterior mitral valve annulus.  3. Sufficient coronary to annulus distance.  4. Optimal Fluoroscopic Angle for Delivery: LAO 11 CAU 3  5. Severe mitral annular calcification.  Gerri Spore T. Flora Lipps, MD   Electronically Signed By: Lennie Odor On: 07/21/2019 17:46  Narrative EXAM: OVER-READ INTERPRETATION  CT CHEST  The following report is an over-read performed by radiologist Dr. Trudie Reed of [facility] on 07/21/2019. This over-read does not include interpretation of cardiac or coronary anatomy or pathology. The coronary calcium score/coronary CTA interpretation by the cardiologist  is attached.  COMPARISON:  None.  FINDINGS: Aortic atherosclerosis. Within the visualized portions of the thorax there are no suspicious appearing pulmonary nodules or masses, there is no acute consolidative airspace disease, no pleural effusions, no pneumothorax and no lymphadenopathy. Visualized portions of the upper abdomen are unremarkable. There are no aggressive appearing lytic or blastic lesions noted in the visualized portions of the skeleton.  IMPRESSION: 1.  Aortic Atherosclerosis (ICD10-I70.0).  Electronically Signed: By: Trudie Reed M.D. On: 07/21/2019 11:46   CT SCANS  CT CORONARY MORPH W/CTA COR W/SCORE 07/16/2019  Addendum 07/20/2019  3:03 PM ADDENDUM REPORT: 07/20/2019 15:01  CLINICAL DATA:  Aortic stenosis  EXAM: Cardiac TAVR CT  TECHNIQUE: The patient was scanned on a [employer] Force 192 slice scanner. A 120 kV retrospective scan was triggered in the descending thoracic aorta at 111 HU's. Gantry rotation speed was 270 msecs and collimation was .9 mm. No beta blockade or nitro were given. The 3D data set was reconstructed in 5% intervals of the R-R cycle. Systolic and diastolic phases were analyzed on a dedicated work station using MPR, MIP and VRT modes.  FINDINGS: Inadequate contrast bolus timing to fully evaluate aortic annulus and aortic valve. All measurements made off of ECG gated sequence obtained at 65% of R-R interval with initial contrast bolus.  Aortic Valve: Unable to comment on aortic valve morphology or cusp separation. Severely thickened, severely calcified aortic valve cusps.  AV calcium score: 4790  Virtual Basal Annulus Measurements made at 65% of R-R interval:  Maximum/Minimum Diameter: 29.3 x 22.9 mm  Perimeter: 83.2 mm  Area: 523 mm2  LVOT calcifications present along aorto-mitral continuity.  Based on these measurements, the annulus would be suitable for a 26 mm Sapien 3 valve.  Sinus of Valsalva  Measurements:  Non-coronary:  35 mm  Right - coronary:  35 mm  Left - coronary:  35 mm  Sinus of Valsalva Height:  Left: 23.6 mm  Right: 23.4 mm  Aorta: Normal variant common origin of the brachiocephalic artery and left common carotid artery. No significant plaque in ascending aorta or aortic arch. Moderate calcifications in descending thoracic aorta.  Sinotubular Junction: 31 mm  Ascending Thoracic Aorta:  33 mm  Aortic Arch: 27 mm  Descending Thoracic Aorta: 30 mm  Coronary Artery Height above Annulus:  Left Main: 17.7 mm  Right Coronary: 18.7 mm  Coronary Arteries: Three vessel coronary artery calcifications.  Optimum Fluoroscopic Angle for Delivery: LAO  7, CAU 7  Severe mitral annular calcifications.  IMPRESSION: 1. Inadequate contrast bolus timing to fully evaluate aortic valve. Measurements obtained off single phase 65% R-R interval with adequately timed contrast bolus.  2. Unable to comment on aortic valve morphology or cusp separation. Severely thickened, severely calcified aortic valve cusps.  3.  AV calcium score: 4790  4.  Annulus area: 523 mm2, suitable for a 26 mm Sapien 3 valve.  5.  Adequate coronary artery height from annulus.  6. Optimum Fluoroscopic Angle for Delivery: LAO 7, CAU 7   Electronically Signed By: Weston Brass On: 07/20/2019 15:01  Narrative EXAM: OVER-READ INTERPRETATION  CT CHEST  The following report is an over-read performed by radiologist Dr. Trudie Reed of [facility] on 07/16/2019. This over-read does not include interpretation of cardiac or coronary anatomy or pathology. The coronary calcium score/coronary CTA interpretation by the cardiologist is attached.  COMPARISON:  None.  FINDINGS: Extracardiac findings are described separately under dictation for contemporaneously obtained CTA chest, abdomen and pelvis.  IMPRESSION: Please see separate dictation for contemporaneously obtained  CTA chest, abdomen and pelvis 07/16/2019 for full description of relevant extracardiac findings.  Electronically Signed: By: Trudie Reed M.D. On: 07/16/2019 15:13          EKG:        Recent Labs: 12/18/2022: BUN 16; Creatinine, Ser 0.96; Hemoglobin 13.6; Platelets 124; Potassium 5.0; Sodium 141  Recent Lipid Panel    Component Value Date/Time   CHOL 127 03/22/2011 0545   TRIG 133 03/22/2011 0545   HDL 48 03/22/2011 0545   CHOLHDL 2.6 03/22/2011 0545   VLDL 27 03/22/2011 0545   LDLCALC 52 03/22/2011 0545          Physical Exam:    VS:  BP 114/62   Pulse 64   Ht 5\' 8"  (1.727 m)   Wt 158 lb 3.2 oz (71.8 kg)   SpO2 95%   BMI 24.05 kg/m     Wt Readings from Last 3 Encounters:  01/08/23 158 lb 3.2 oz (71.8 kg)  12/22/22 157 lb (71.2 kg)  12/18/22 157 lb (71.2 kg)     GEN: Elderly male in no acute distress HEENT: Normal NECK: No JVD; No carotid bruits LYMPHATICS: No lymphadenopathy CARDIAC: RRR, 3/6 holosystolic murmur at the apex RESPIRATORY:  Clear to auscultation without rales, wheezing or rhonchi  ABDOMEN: Soft, non-tender, non-distended MUSCULOSKELETAL:  No edema; No deformity  SKIN: Warm and dry NEUROLOGIC:  Alert and oriented x 3 PSYCHIATRIC:  Normal affect   Assessment & Plan Nonrheumatic mitral (valve) insufficiency The patient returns for follow-up evaluation of severe mitral regurgitation.  He is now undergone transesophageal echo which shows severe mitral regurgitation with a P3 flail segment and systolic flow reversal in the right upper pulmonary vein.  His echo findings are consistent with severe mitral regurgitation with an ERO of 0.43 cm.  LVEF is preserved at 55 to 60%.  The patient's TAVR prosthesis is functioning normally with a mean gradient of 7 mmHg and no paravalvular regurgitation.  Difficult to know if he is symptomatic from his mitral valve insufficiency.  He does have marked fatigue with no other associated symptoms.  If he had  straightforward mitral valve anatomy, I would favor an attempt at mitral valve TEER.  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  I think his procedure will be technically difficult and even with a successful procedure with initial reduction in MR,  I think he would be at high risk of recurrent MR during short-term follow-up.  This has been our experience with this type of anatomy.  I reviewed all of this with the patient and considering his lack of shortness of breath or clear-cut heart failure symptoms, I would favor ongoing surveillance and medical therapy.  The patient and his wife understand and will continue to follow-up regularly with both Dr. Herbie Baltimore and me.  Will check an echo in 6 months.            Medication Adjustments/Labs and Tests Ordered: Current medicines are reviewed at length with the patient today.  Concerns regarding medicines are outlined above.  Orders Placed This Encounter  Procedures   ECHOCARDIOGRAM COMPLETE   No orders of the defined types were placed in this encounter.   Patient Instructions  Testing/Procedures: ECHO (in 6 months) Your physician has requested that you have an echocardiogram. Echocardiography is a painless test that uses sound waves to create images of your heart. It provides your doctor with information about the size and shape of your heart and how well your heart's chambers and valves are working. This procedure takes approximately one hour. There are no restrictions for this procedure. Please do NOT wear cologne, perfume, aftershave, or lotions (deodorant is allowed). Please arrive 15 minutes prior to your appointment time.  Please note: We ask at that you not bring children with you during ultrasound (echo/ vascular) testing. Due to room size and safety concerns, children are not allowed in the ultrasound rooms during exams. Our front office staff cannot provide observation of children in our lobby area  while testing is being conducted. An adult accompanying a patient to their appointment will only be allowed in the ultrasound room at the discretion of the ultrasound technician under special circumstances. We apologize for any inconvenience.  Follow-Up: At [facility], you and your health needs are our priority.  As part of our continuing mission to provide you with exceptional heart care, we have created designated Provider Care Teams.  These Care Teams include your primary Cardiologist (physician) and Advanced Practice Providers (APPs -  Physician Assistants and Nurse Practitioners) who all work together to provide you with the care you need, when you need it.  We recommend signing up for the patient portal called "MyChart".  Sign up information is provided on this After Visit Summary.  MyChart is used to connect with patients for Virtual Visits (Telemedicine).  Patients are able to view lab/test results, encounter notes, upcoming appointments, etc.  Non-urgent messages can be sent to your provider as well.   To learn more about what you can do with MyChart, go to [email or web address].    Your next appointment:   6 month(s)  Provider:   Tonny Bollman, MD     Signed, Tonny Bollman, MD  01/19/2023 10:28 AM     [facility]

## 2023-01-18 ENCOUNTER — Encounter: Payer: Medicare Other | Admitting: Thoracic Surgery (Cardiothoracic Vascular Surgery)

## 2023-02-02 ENCOUNTER — Other Ambulatory Visit: Payer: Self-pay | Admitting: Cardiology

## 2023-02-06 ENCOUNTER — Other Ambulatory Visit: Payer: Self-pay | Admitting: Cardiology

## 2023-02-14 ENCOUNTER — Ambulatory Visit: Payer: Medicare Other | Attending: Cardiology | Admitting: Cardiology

## 2023-02-14 ENCOUNTER — Telehealth: Payer: Self-pay

## 2023-02-14 ENCOUNTER — Telehealth: Payer: Self-pay | Admitting: *Deleted

## 2023-02-14 ENCOUNTER — Encounter: Payer: Self-pay | Admitting: Cardiology

## 2023-02-14 NOTE — Telephone Encounter (Signed)
 Patient was called with no answer message was left with call back number will make another attempt to call back

## 2023-02-14 NOTE — Progress Notes (Signed)
 Virtual Visit via Telephone Note - Erroneous Encounter   Because of Finneus L Cowley's co-morbid illnesses, he is at least at moderate risk for complications without adequate follow up.  This format is felt to be most appropriate for this patient at this time.  The patient did not have access to video technology/had technical difficulties with video requiring transitioning to audio format only (telephone).  All issues noted in this document were discussed and addressed.  No physical exam could be performed with this format.  Please refer to the patient's chart for his consent to telehealth for [facility].   Patient has given verbal permission to conduct this visit via virtual appointment and to bill insurance 02/14/2023 11:40 AM     Evaluation Performed:  Follow-up visit  Date:  02/14/2023   ID:  Thorsen, Spiker [birth year], MRN [id number]  Patient Location: Home Provider Location: Office/Clinic  Cardiology Virtual Office Note  Date:  02/14/2023  ID:  Zariel, Boling July 01, 1931, MRN [id number] PCP: Lorrie Rothman, MD   [facility] Providers Cardiologist:  Randene Bustard, MD { No chief complaint on file.   Patient Profile: .     Annette GARET LIZ is a  89 y.o. male  with a PMH notable for  CAD-PCI, h/o Critical AS-s/p TAVR, HTN, & HLD who presents here for 36-month follow-up at the request of Feldpausch, Genetta Kenning, MD.  PMH notable for CAD, carotid disease, HTN, HLD and Critical AS-s/p TAVR (June 2021) who presents today for annual follow-up.  2008 CAD -> PCI RCA  Pre-TAVR Cath 05/2019: Patent RCA stent. pRCA ~55%, and 70% small D2 SEVERE AORTIC STENOSIS (progression to severe February 2021 -> mean gradient 42 mmHg.) ->  Became symptomatic early May 2021 with.  AVG up to 50 mmHg. TAVR procedure Date 07/29/2019: 29 mm Sapien prosthetic (TAVR) valve Over the years we have gradually weaned him off of BP medications due to fatigue, orthostasis.  He remains on rosuvastatin   and Zetia  for lipids along with aspirin . Severe MR with severe P2 MVP -> felt to have too many comorbidities and unfavorable anatomy for TEER    Nesanel L Trentham was last seen on November 24, 2022-most notable issue was decreased energy level with fatigue and exercise intolerance.  Feeling very weak but still trying to work out with some type of exercise 3 days a week.  Doing full body workouts with chest press curls, back and neck exercises.  Tries to do 10 to 15-minute walks as well.  He just notes prolonged recovery time.  No chest pain or pressure.  No arrhythmia symptoms.  No syncope or near syncope. => Follow-Up Echo ordered for TAVR and MV reassessment. => Based on results, TEE was recommended, but [name] was able to get great transthoracic images confirming P2 leaflet prolapse and severe MR. Follow-up evaluation with [name] on 01/08/2023 to discuss echo results-still noting fatigue but no real chest pain or dyspnea per se.  " The patient's TAVR prosthesis is functioning normally with a mean gradient of 7 mmHg and no paravalvular regurgitation.  Difficult to know if he is symptomatic from his mitral valve insufficiency.  He does have marked fatigue with no other associated symptoms.  If he had straightforward mitral valve anatomy, I would favor an attempt at mitral valve TEER.  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  I think his procedure will be technically difficult and even with a  successful procedure with initial reduction in MR, I think he would be at high risk of recurrent MR during short-term follow-up.  This has been our experience with this type of anatomy.  I reviewed all of this with the patient and considering his lack of shortness of breath or clear-cut heart failure symptoms, I would favor ongoing surveillance and medical therapy.  The patient and his wife understand and will continue to follow-up regularly with both Dr. [name] and me.  Will  check an echo in 6 months."    Studies Reviewed: [name]        ECHO 12/29/2022: EF 55 to 60%.  Normal LV size and function.  Edwards TAVR valve (07/29/2019)-normal function.  Mean AVG 7 mmHg.  Mitral valve shows Myxomatous Valve with Severe MR with P3 flail leaflet, and systolic flow reversal in the RU PV. Echo 12/13/2022: EF 60 to 65%.  No RWMA.  Severe MR with moderate holosystolic MVP and moderate MAC.  Moderate to severe TR.  Stable TAVR valve. Signed, Arleen Lacer, MD, MS  The patient was initially contacted and vital signs obtained for virtual visit, however when I tried to contact him via telephone, despite calling both his home and mobile phone numbers, I do not get any answer.  I tried multiple times.  Erroneous encounter.

## 2023-02-14 NOTE — Telephone Encounter (Signed)
 Patient was called for 2nd time with no answer message was left in regards to appointment

## 2023-02-14 NOTE — Progress Notes (Deleted)
 Virtual Visit via Telephone Note   Because of Victor [name]'s co-morbid illnesses, he is at least at moderate risk for complications without adequate follow up.  This format is felt to be most appropriate for this patient at this time.  The patient did not have access to video technology/had technical difficulties with video requiring transitioning to audio format only (telephone).  All issues noted in this document were discussed and addressed.  No physical exam could be performed with this format.  Please refer to the patient's chart for his consent to telehealth for [facility].   Patient has given verbal permission to conduct this visit via virtual appointment and to bill insurance 02/14/2023 11:40 AM     Evaluation Performed:  Follow-up visit  Date:  02/14/2023   ID:  [name], [name] 11-18-1931, MRN [id number]  Patient Location: Home Provider Location: Office/Clinic  Cardiology Virtual Office Note  Date:  02/14/2023  ID:  Rande, Kerstein 28-May-1931, MRN [id number] PCP: Lorrie Rothman, MD  [facility] Providers Cardiologist:  Randene Bustard, MD { Chief Complaint  Patient presents with  . Error     Patient Profile: .     Victor [name] is a *** 89 y.o. male *** with a PMH notable for  CAD-PCI, h/o Critical AS-s/p TAVR, HTN, & HLD who presents here for 78-month follow-up at the request of Feldpausch, Genetta Kenning, MD.  PMH notable for CAD, carotid disease, HTN, HLD and Critical AS-s/p TAVR (June 2021) who presents today for annual follow-up.  2008 CAD -> PCI RCA  Pre-TAVR Cath 05/2019: Patent RCA stent. pRCA ~55%, and 70% small D2 SEVERE AORTIC STENOSIS (progression to severe February 2021 -> mean gradient 42 mmHg.) ->  Became symptomatic early May 2021 with.  AVG up to 50 mmHg. TAVR procedure Date 07/29/2019: 29 mm Sapien prosthetic (TAVR) valve Over the years we have gradually weaned him off of BP medications due to fatigue, orthostasis.  He remains on  rosuvastatin  and Zetia  for lipids along with aspirin . Severe MR with severe P2 MVP -> felt to have too many comorbidities and unfavorable anatomy for TEER    Victor [name] was last seen on November 24, 2022-most notable issue was decreased energy level with fatigue and exercise intolerance.  Feeling very weak but still trying to work out with some type of exercise 3 days a week.  Doing full body workouts with chest press curls, back and neck exercises.  Tries to do 10 to 15-minute walks as well.  He just notes prolonged recovery time.  No chest pain or pressure.  No arrhythmia symptoms.  No syncope or near syncope. => Follow-Up Echo ordered for TAVR and MV reassessment. => Based on results, TEE was recommended, but [name] was able to get great transthoracic images confirming P2 leaflet prolapse and severe MR. Follow-up evaluation with [name] on 01/08/2023 to discuss echo results-still noting fatigue but no real chest pain or dyspnea per se.  " The patient's TAVR prosthesis is functioning normally with a mean gradient of 7 mmHg and no paravalvular regurgitation.  Difficult to know if he is symptomatic from his mitral valve insufficiency.  He does have marked fatigue with no other associated symptoms.  If he had straightforward mitral valve anatomy, I would favor an attempt at mitral valve TEER.  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  I think his procedure will be technically difficult and  even with a successful procedure with initial reduction in MR, I think he would be at high risk of recurrent MR during short-term follow-up.  This has been our experience with this type of anatomy.  I reviewed all of this with the patient and considering his lack of shortness of breath or clear-cut heart failure symptoms, I would favor ongoing surveillance and medical therapy.  The patient and his wife understand and will continue to follow-up regularly with both Dr. [name]  and me.  Will check an echo in 6 months."   Subjective  Discussed the use of AI scribe software for clinical note transcription with the patient, who gave verbal consent to proceed.  History of Present Illness            Cardiovascular ROS: {roscv:310661}  ROS:  Review of Systems - {ros master:310782}    Objective   Current Meds  Medication Sig  . acetaminophen  (TYLENOL ) 500 MG tablet Take 500 mg by mouth every 6 (six) hours as needed for moderate pain or headache.  Victor [name] ascorbic acid (VITAMIN C) 500 MG tablet Take 1,000-1,500 mg by mouth See admin instructions. Take 1000 mg in the morning and 1500 mg at night  . aspirin  EC 81 MG tablet Take 81 mg by mouth daily.  . benzonatate  (TESSALON ) 200 MG capsule Take 200 mg by mouth 3 (three) times daily as needed for cough.  . bismuth subsalicylate (PEPTO BISMOL) 262 MG/15ML suspension Take 30 mLs by mouth every 6 (six) hours as needed for indigestion or diarrhea or loose stools.  . cetirizine  (ZYRTEC ) 10 MG tablet Take 10 mg by mouth as needed.  . Cholecalciferol (VITAMIN D3) 2000 UNITS TABS Take 4,000 Units by mouth 2 (two) times daily.   Victor [name] CINNAMON PO Take 1,000 mg by mouth 2 (two) times daily.  . clindamycin  (CLEOCIN ) 300 MG capsule Take 2 capsules (600 mg) 1 hour prior to all dental visits.  . Coenzyme Q10 (COQ-10) 100 MG CAPS Take 100 mg by mouth daily.  . Cyanocobalamin  (B-12) 2500 MCG TABS Take 2,500 mcg by mouth daily.  . donepezil  (ARICEPT ) 10 MG tablet Take 10 mg by mouth daily.  . EPIPEN  2-PAK 0.3 MG/0.3ML SOAJ injection Inject 0.3 mLs as directed as needed for anaphylaxis.   . ezetimibe  (ZETIA ) 10 MG tablet Take 10 mg by mouth daily.  . fluticasone  (FLONASE ) 50 MCG/ACT nasal spray Place 2 sprays into both nostrils 2 (two) times daily as needed for allergies.   . gabapentin  (NEURONTIN ) 100 MG capsule Take 300 mg by mouth at bedtime.   Victor [name] glipiZIDE (GLUCOTROL XL) 5 MG 24 hr tablet Take 5 mg by mouth daily with breakfast.  .  Glucosamine HCl 1000 MG TABS Take 1,000 mg by mouth 2 (two) times daily.  Victor [name] ipratropium (ATROVENT ) 0.03 % nasal spray Place 2 sprays into both nostrils 2 (two) times daily as needed for rhinitis.  Victor [name] loperamide (IMODIUM A-D) 2 MG tablet Take 2 mg by mouth 2 (two) times daily.  . meloxicam (MOBIC) 15 MG tablet Take 15 mg by mouth daily as needed for pain.  . memantine (NAMENDA) 5 MG tablet Take 5 mg by mouth daily.  . mirtazapine (REMERON) 15 MG tablet Take 15 mg by mouth at bedtime.  . Multiple Vitamin (MULITIVITAMIN WITH MINERALS) TABS Take 1 tablet by mouth 2 (two) times daily.   . Omega 3 1200 MG CAPS Take 2,400 mg by mouth 2 (two) times daily.   . Polyethyl Glycol-Propyl Glycol (SYSTANE) 0.4-0.3 %  SOLN Place 1 drop into both eyes daily.  . Probiotic CAPS Take 1 capsule by mouth daily.  . rosuvastatin  (CRESTOR ) 20 MG tablet TAKE 2 TABLETS BY MOUTH ON MONDAYS, WEDNESDAYS AND FRIDAYS AND 1 TABLET SATURDAY, SUNDAY, TUESDAY AND THURSDAY  . Saw Palmetto 450 MG CAPS Take 900 mg by mouth daily.   Victor [name] zinc gluconate 50 MG tablet Take 50 mg by mouth daily.     Studies Reviewed: Victor [name]        ECHO 12/29/2022: EF 55 to 60%.  Normal LV size and function.  Edwards TAVR valve (07/29/2019)-normal function.  Mean AVG 7 mmHg.  Mitral valve shows Myxomatous Valve with Severe MR with P3 flail leaflet, and systolic flow reversal in the RU PV. Echo 12/13/2022: EF 60 to 65%.  No RWMA.  Severe MR with moderate holosystolic MVP and moderate MAC.  Moderate to severe TR.  Stable TAVR valve.   Risk Assessment/Calculations:   {Does this patient have ATRIAL FIBRILLATION?:[phone]}          Physical Exam:   VS:  BP 100/63   Pulse 69   Ht 5\' 8"  (1.727 m)   Wt 160 lb (72.6 kg)   BMI 24.33 kg/m    Wt Readings from Last 3 Encounters:  02/14/23 160 lb (72.6 kg)  01/08/23 158 lb 3.2 oz (71.8 kg)  12/22/22 157 lb (71.2 kg)    GEN: Well nourished, well developed in no acute distress; *** NECK: No JVD; No carotid  bruits CARDIAC: Normal S1, S2; RRR, no murmurs, rubs, gallops RESPIRATORY:  Clear to auscultation without rales, wheezing or rhonchi ; nonlabored, good air movement. ABDOMEN: Soft, non-tender, non-distended EXTREMITIES:  No edema; No deformity      ASSESSMENT AND PLAN: .    Problem List Items Addressed This Visit   None Visit Diagnoses      Erroneous encounter - disregard    -  Primary       Assessment and Plan                {Are you ordering a CV Procedure (e.g. stress test, cath, DCCV, TEE, etc)?   Press F2        :[id number]}   Follow-Up: No follow-ups on file.  Total time spent: *** min spent with patient + *** min spent charting = *** min    Signed, Arleen Lacer, MD, MS Randene Bustard, M.D., M.S. Interventional Cardiologist  [facility]  Pager # [phone] Phone # [phone] [address] [place], [place] [address] This encounter was created in error - please disregard.

## 2023-02-14 NOTE — Telephone Encounter (Signed)
 Open error

## 2023-02-14 NOTE — Telephone Encounter (Signed)
  Patient Consent for Virtual Visit        Victor [name] has provided verbal consent on 02/14/2023 for a virtual visit (video or telephone).   CONSENT FOR VIRTUAL VISIT FOR:  Victor [name]  By participating in this virtual visit I agree to the following:  I hereby voluntarily request, consent and authorize [facility] and its employed or contracted physicians, physician assistants, nurse practitioners or other licensed health care professionals (the Practitioner), to provide me with telemedicine health care services (the "Services") as deemed necessary by the treating Practitioner. I acknowledge and consent to receive the Services by the Practitioner via telemedicine. I understand that the telemedicine visit will involve communicating with the Practitioner through live audiovisual communication technology and the disclosure of certain medical information by electronic transmission. I acknowledge that I have been given the opportunity to request an in-person assessment or other available alternative prior to the telemedicine visit and am voluntarily participating in the telemedicine visit.  I understand that I have the right to withhold or withdraw my consent to the use of telemedicine in the course of my care at any time, without affecting my right to future care or treatment, and that the Practitioner or I may terminate the telemedicine visit at any time. I understand that I have the right to inspect all information obtained and/or recorded in the course of the telemedicine visit and may receive copies of available information for a reasonable fee.  I understand that some of the potential risks of receiving the Services via telemedicine include:  Delay or interruption in medical evaluation due to technological equipment failure or disruption; Information transmitted may not be sufficient (e.g. poor resolution of images) to allow for appropriate medical decision making by the Practitioner;  and/or  In rare instances, security protocols could fail, causing a breach of personal health information.  Furthermore, I acknowledge that it is my responsibility to provide information about my medical history, conditions and care that is complete and accurate to the best of my ability. I acknowledge that Practitioner's advice, recommendations, and/or decision may be based on factors not within their control, such as incomplete or inaccurate data provided by me or distortions of diagnostic images or specimens that may result from electronic transmissions. I understand that the practice of medicine is not an exact science and that Practitioner makes no warranties or guarantees regarding treatment outcomes. I acknowledge that a copy of this consent can be made available to me via my patient portal [facility] MyChart), or I can request a printed copy by calling the office of [facility].    I understand that my insurance will be billed for this visit.   I have read or had this consent read to me. I understand the contents of this consent, which adequately explains the benefits and risks of the Services being provided via telemedicine.  I have been provided ample opportunity to ask questions regarding this consent and the Services and have had my questions answered to my satisfaction. I give my informed consent for the services to be provided through the use of telemedicine in my medical care

## 2023-02-14 NOTE — Progress Notes (Deleted)
  Cardiology Office Note:  .   Date:  02/14/2023  ID:  Victor, [name] October 30, 1931, MRN [id number] PCP: Lorrie Rothman, MD  [facility] Providers Cardiologist:  Randene Bustard, MD { Click to update primary MD,subspecialty MD or APP then REFRESH:1}    Chief Complaint  Patient presents with   Error    Patient Profile: .     Victor [name] is a *** 89 y.o. male *** with a PMH notable for *** who presents here for *** at the request of Feldpausch, Genetta Kenning, MD.  Victor [name] is a 89 y.o. male with a PMH notable CAD-PCI, h/o Critical AS-s/p TAVR, HTN, & HLD who presents here for annual follow-up.  He continues to return at the request of Lorrie Rothman, MD.  PMH notable for CAD, carotid disease, HTN, HLD and Critical AS-s/p TAVR (June 2021) who presents today for annual follow-up.  2008 CAD -> PCI RCA  Pre-TAVR Cath 05/2019: Patent RCA stent. pRCA ~55%, and 70% small D2 SEVERE AORTIC STENOSIS (progression to severe February 2021 -> mean gradient 42 mmHg.) ->  Became symptomatic early May 2021 with.  AVG up to 50 mmHg. TAVR procedure Date 07/29/2019: 29 mm Sapien prosthetic (TAVR) valve        Victor [name] was last seen on ***  Subjective  Discussed the use of AI scribe software for clinical note transcription with the patient, who gave verbal consent to proceed.  History of Present Illness            Cardiovascular ROS: {roscv:310661}  ROS:  Review of Systems - {ros master:310782}    Objective   Studies Reviewed: [name]        ECHO: *** CATH: *** MONITOR: *** CT: ***  Risk Assessment/Calculations:   {Does this patient have ATRIAL FIBRILLATION?:[phone]}           Physical Exam:   VS:  BP 100/63   Pulse 69   Ht 5\' 8"  (1.727 m)   Wt 160 lb (72.6 kg)   BMI 24.33 kg/m    Wt Readings from Last 3 Encounters:  02/14/23 160 lb (72.6 kg)  01/08/23 158 lb 3.2 oz (71.8 kg)  12/22/22 157 lb (71.2 kg)    GEN: Well nourished, well developed in no  acute distress; *** NECK: No JVD; No carotid bruits CARDIAC: Normal S1, S2; RRR, no murmurs, rubs, gallops RESPIRATORY:  Clear to auscultation without rales, wheezing or rhonchi ; nonlabored, good air movement. ABDOMEN: Soft, non-tender, non-distended EXTREMITIES:  No edema; No deformity      ASSESSMENT AND PLAN: .    Problem List Items Addressed This Visit   None Visit Diagnoses       Erroneous encounter - disregard    -  Primary       Assessment and Plan                {Are you ordering a CV Procedure (e.g. stress test, cath, DCCV, TEE, etc)?   Press F2        :[id number]}   Follow-Up: No follow-ups on file.  Total time spent: *** min spent with patient + *** min spent charting = *** min    Signed, Arleen Lacer, MD, MS Randene Bustard, M.D., M.S. Interventional Cardiologist  [facility]  Pager # [phone] Phone # [phone] [address] [place], [place] [address]

## 2023-02-19 ENCOUNTER — Encounter: Payer: Self-pay | Admitting: Urology

## 2023-02-19 ENCOUNTER — Ambulatory Visit (INDEPENDENT_AMBULATORY_CARE_PROVIDER_SITE_OTHER): Payer: Medicare Other | Admitting: Urology

## 2023-02-19 VITALS — BP 120/69 | HR 80

## 2023-02-19 DIAGNOSIS — R399 Unspecified symptoms and signs involving the genitourinary system: Secondary | ICD-10-CM

## 2023-02-19 DIAGNOSIS — N368 Other specified disorders of urethra: Secondary | ICD-10-CM | POA: Diagnosis not present

## 2023-02-19 LAB — BLADDER SCAN AMB NON-IMAGING: Scan Result: 33

## 2023-02-19 LAB — URINALYSIS, COMPLETE
Bilirubin, UA: NEGATIVE
Glucose, UA: NEGATIVE
Ketones, UA: NEGATIVE
Leukocytes,UA: NEGATIVE
Nitrite, UA: NEGATIVE
RBC, UA: NEGATIVE
Specific Gravity, UA: >1.03 — ABNORMAL HIGH (ref 1.005–1.030)
Urobilinogen, Ur: 0.2 mg/dL (ref 0.2–1.0)
pH, UA: 5.5 (ref 5.0–7.5)

## 2023-02-19 LAB — MICROSCOPIC EXAMINATION

## 2023-02-19 MED ORDER — GEMTESA 75 MG PO TABS: 75.0000 mg | ORAL_TABLET | Freq: Every day | ORAL | Status: DC

## 2023-02-19 NOTE — Progress Notes (Signed)
I, Victor [name], acting as a scribe for Victor Altes, MD., have documented all relevant documentation on the behalf of Victor Altes, MD, as directed by Victor Altes, MD while in the presence of Victor Altes, MD.  02/19/2023 4:45 PM   Victor [name] 1931-11-01 [id number]  Referring provider: Marina Goodell, MD [address] [place],  [place] [address]  Chief Complaint  Patient presents with   Establish Care    HPI: Victor [name] is a 89 y.o. male referred for hematuria. He presents today with his daughter who contributed to the history.  Saw Dr. Maryjane [name] 01/15/23 with 1 episode of blood spotting on his underwear, which occurred 01/14/23.  He had no dysuria or gross hematuria. Urinalysis at that office visit showed no microhematuria. Past urological history TURP by Dr. Patsi [name] February 2011.  He was seeing Dr. Evelene [name] for "regular checkups" though no specific urologic diagnosis. Dr. Amada [name] records have been requested but not yet received.  He does have bothersome frequency, urgency, and occasional urge incontinence.   PMH: Past Medical History:  Diagnosis Date   Angina    Arthritis    BPH (benign prostatic hypertrophy)    CAD S/P percutaneous coronary angioplasty 07/2006   PCI to RCA - Promus DES 2.5 mm x 23 mm; 2D ECHO - EF >55% --> Cath 05/2019 - ~50-60% pRCA with patent stent.  70% ost D1 (small).     Diabetes mellitus    Diverticulitis    s/p colectomy   GERD (gastroesophageal reflux disease)    H/O Severe aortic stenosis 05/2019   Pre-TAVR mean AVA 52.5 mmHg.  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    Squamous cell skin cancer, nasal tip     Surgical History: Past Surgical History:  Procedure Laterality Date   CARDIAC CATHETERIZATION with PCI  08/27/2006   RCA-mid - 2.5x65mm Promus stent   CATARACT EXTRACTION W/ INTRAOCULAR LENS  IMPLANT, BILATERAL     CATARACT EXTRACTION W/PHACO Right 03/08/2015   Procedure: CATARACT EXTRACTION PHACO AND INTRAOCULAR LENS PLACEMENT (IOC);  Surgeon: Sallee Lange, MD;  Location: [facility] ORS;  Service: Ophthalmology;  Laterality: Right;  [place]: 01:29.4    COLECTOMY  ~ 2000   LEFT HEART CATHETERIZATION WITH CORONARY ANGIOGRAM N/A 03/22/2011   Procedure: LEFT HEART CATHETERIZATION WITH CORONARY ANGIOGRAM;  Surgeon: Marykay Lex, MD;  Location: [facility] CATH LAB;  Service: Cardiovascular::: Patent RCA stent w/ progression of pRCA Dz to ~50-60%. Progression of oD3 lesion to 60-70% - not optimal for PCI (b/c ostial).  EF 55-60% - no RWMA. Med Rx.  Aortic valve gradient: Peak 22 mmHg, mean 13 mmHg   NM MYOVIEW LTD  08/2014   LOW RISK. NORMAL.  EF 45-54%.    RIGHT/LEFT HEART CATH AND CORONARY ANGIOGRAPHY N/A 06/25/2019   Procedure: RIGHT/LEFT HEART CATH AND CORONARY ANGIOGRAPHY;  Surgeon: Marykay Lex, MD;  Location: [facility] INVASIVE CV LAB;  Severe AS (mean gradent 44.2 mmHg). Stable CAD - 50-60% pRCA with widely patent stent.  Small  caliber D2 w/ ost 70%. Normal RHC Pressures.   TEE WITHOUT CARDIOVERSION N/A 07/29/2019   Procedure: TRANSESOPHAGEAL ECHOCARDIOGRAM (TEE);  Surgeon: Kathleene Hazel, MD;  Location: [facility] INVASIVE CV LAB;  Service: Open Heart Surgery;  Laterality: N/A;   TRANSCATHETER AORTIC VALVE REPLACEMENT, TRANSFEMORAL N/A 07/29/2019   Procedure: TRANSCATHETER AORTIC VALVE REPLACEMENT, TRANSFEMORAL;  Surgeon: Kathleene Hazel, MD;  Location: [facility] INVASIVE CV LAB;; R TFA Approach -> Edwards Sapien 3 THV (size 29 mm, model # [id number], serial # [id number])   TRANSESOPHAGEAL ECHOCARDIOGRAM (CATH LAB) N/A 12/29/2022   Procedure: TRANSESOPHAGEAL  ECHOCARDIOGRAM;  Surgeon: Sande Rives, MD;  Location: [facility] INVASIVE CV LAB;  Service: Cardiovascular;  Laterality: N/A;   TRANSTHORACIC ECHOCARDIOGRAM  03/2019   a) 03/2019: calcified aortic valve-severe stenosis.  AVA 0.79 cm.  Mean gradient 42.3 mmHg.  EF 60 to 65%.;; b) 05/2019: Severe/critical AS -V-max 4.38m/s, mean gradient 52-53 mmHg, AVA 0.49 cm. EF 60-65%. No RWMA. Gr 1 DD?Marland Kitchen Mod-Severe LA dilation   TRANSTHORACIC ECHOCARDIOGRAM  07/2019   a) 07/2019: POD #1 TAVR: EF 60 to 65%.  Moderate LA dilation.  Mild to moderate MR.  Well seated, nl fxning  TAVR valve.  Mean gradient 5 mmHg.;; b) 08/2019: EF 60 to 65%.  Normal RV function.  GR 1 DD.  No R WMA.  Moderate LA dilation.  Mild to moderate MR.  Mild to moderate TR.  Stable Edwards Sapien 29 mmTAVR valve in aortic position -normal function.  Mean gradient 8 mmHg.   TRANSTHORACIC ECHOCARDIOGRAM  08/18/2020   29 mm Sapien bioprosthetic TAVR well-positioned.  No PVL.  Mean gradient 9 mmHg-no residual AS.  Normal LV function with EF 55 to 60%.  No R WMA.  Mild concentric LVH.  GR 1 DD with mild LA and RA dilation.  Normal RV size and function.  Normal RVP/RAP.  Mild MV degeneration but no MR or MS.   TRANSURETHRAL RESECTION OF PROSTATE  ~ 2010    Home Medications:  Allergies as of 02/19/2023       Reactions   Azithromycin Rash, Anaphylaxis   Cephalosporins Anaphylaxis, Other (See Comments)   Codeine Anaphylaxis, Itching, Rash   Doxycycline Anaphylaxis   Phenylephrine-guaifenesin    Unknown reaction    Pseudoephedrine Other (See Comments)   unknown   Amoxicillin Rash   Levofloxacin Rash   Penicillins Rash   Other reaction(s): Unknown   Sulfa Antibiotics Rash   Sulfasalazine Rash        Medication List        Accurate as of February 19, 2023  4:45 PM. If you have any questions, ask your nurse or doctor.          acetaminophen 500 MG tablet Commonly known as: TYLENOL Take 500 mg by mouth every 6 (six) hours as  needed for moderate pain or headache.   ascorbic acid 500 MG tablet Commonly known as: VITAMIN C Take 1,000-1,500 mg by mouth See admin instructions. Take 1000 mg in the morning and 1500 mg at night   aspirin EC 81 MG tablet Take 81 mg by mouth daily.   B-12 2500 MCG Tabs Take 2,500 mcg by mouth daily.   benzonatate 200 MG capsule Commonly known as: TESSALON Take 200 mg by mouth 3 (three) times daily as needed for cough.   bismuth subsalicylate 262 MG/15ML suspension Commonly known as: PEPTO BISMOL Take 30 mLs by mouth every 6 (six) hours as needed for indigestion or diarrhea or loose stools.   cetirizine  10 MG tablet Commonly known as: ZYRTEC Take 10 mg by mouth as needed.   CINNAMON PO Take 1,000 mg by mouth 2 (two) times daily.   clindamycin 300 MG capsule Commonly known as: Cleocin Take 2 capsules (600 mg) 1 hour prior to all dental visits.   CoQ-10 100 MG Caps Take 100 mg by mouth daily.   divalproex 125 MG DR tablet Commonly known as: DEPAKOTE Take 125 mg by mouth 2 (two) times daily.   donepezil 10 MG tablet Commonly known as: ARICEPT Take 10 mg by mouth daily.   EpiPen 2-Pak 0.3 MG/0.3ML Soaj injection Generic drug: EPINEPHrine Inject 0.3 mLs as directed as needed for anaphylaxis.   ezetimibe 10 MG tablet Commonly known as: ZETIA Take 10 mg by mouth daily.   fluticasone 50 MCG/ACT nasal spray Commonly known as: FLONASE Place 2 sprays into both nostrils 2 (two) times daily as needed for allergies.   gabapentin 100 MG capsule Commonly known as: NEURONTIN Take 300 mg by mouth at bedtime.   Gemtesa 75 MG Tabs Generic drug: Vibegron Take 1 tablet (75 mg total) by mouth daily. Started by: Victor [name]   glipiZIDE 5 MG 24 hr tablet Commonly known as: GLUCOTROL XL Take 5 mg by mouth daily with breakfast.   Glucosamine HCl 1000 MG Tabs Take 1,000 mg by mouth 2 (two) times daily.   ipratropium 0.03 % nasal spray Commonly known as:  ATROVENT Place 2 sprays into both nostrils 2 (two) times daily as needed for rhinitis.   loperamide 2 MG tablet Commonly known as: IMODIUM A-D Take 2 mg by mouth 2 (two) times daily.   meloxicam 15 MG tablet Commonly known as: MOBIC Take 15 mg by mouth daily as needed for pain.   memantine 5 MG tablet Commonly known as: NAMENDA Take 5 mg by mouth daily.   mirtazapine 15 MG tablet Commonly known as: REMERON Take 15 mg by mouth at bedtime.   multivitamin with minerals Tabs tablet Take 1 tablet by mouth 2 (two) times daily.   Omega 3 1200 MG Caps Take 2,400 mg by mouth 2 (two) times daily.   Probiotic Caps Take 1 capsule by mouth daily.   rosuvastatin 20 MG tablet Commonly known as: CRESTOR TAKE 2 TABLETS BY MOUTH ON MONDAYS, WEDNESDAYS AND FRIDAYS AND 1 TABLET SATURDAY, SUNDAY, TUESDAY AND THURSDAY   Saw Palmetto 450 MG Caps Take 900 mg by mouth daily.   Systane 0.4-0.3 % Soln Generic drug: Polyethyl Glycol-Propyl Glycol Place 1 drop into both eyes daily.   Vitamin D3 50 MCG (2000 UT) Tabs Take 4,000 Units by mouth 2 (two) times daily.   zinc gluconate 50 MG tablet Take 50 mg by mouth daily.        Allergies:  Allergies  Allergen Reactions   Azithromycin Rash and Anaphylaxis   Cephalosporins Anaphylaxis and Other (See Comments)   Codeine Anaphylaxis, Itching and Rash   Doxycycline Anaphylaxis   Phenylephrine-Guaifenesin     Unknown reaction    Pseudoephedrine Other (See Comments)    unknown    Amoxicillin Rash   Levofloxacin Rash   Penicillins Rash    Other reaction(s): Unknown   Sulfa Antibiotics Rash   Sulfasalazine Rash    Family History: Family History  Problem Relation Age of Onset   ALS Mother    Prostate cancer Father     Social History:  reports that he has never smoked. He quit smokeless tobacco use about 27 years ago.  His smokeless  tobacco use included chew. He reports that he does not currently use alcohol after a past usage of  about 1.0 standard drink of alcohol per week. He reports that he does not use drugs.   Physical Exam: BP 120/69   Pulse 80   Constitutional:  Alert and oriented, No acute distress. HEENT: [place] AT, moist mucus membranes.  Trachea midline, no masses. Cardiovascular: No clubbing, cyanosis, or edema. Respiratory: Normal respiratory effort, no increased work of breathing. GI: Abdomen is soft, nontender, nondistended, no abdominal masses Skin: No rashes, bruises or suspicious lesions. Neurologic: Grossly intact, no focal deficits, moving all 4 extremities. Psychiatric: Normal mood and affect.  Urinalysis Dipstick trace protein/microscopy negative    Assessment & Plan:    1. Urethral spotting No gross hematuria and UA's x2o have not shown microhematuria  Recommend getting 1 cystoscopy for lower tract evaluation  2. Lower urinary tract symptoms History of TURP and primarily storage-related voiding symptoms.  PVR today*** Trial Gemtesa 75 mg daily-samples given.  [facility] [address], [address] [place], [place] [address] [phone]

## 2023-02-20 ENCOUNTER — Other Ambulatory Visit: Payer: Self-pay | Admitting: Cardiology

## 2023-02-22 NOTE — Telephone Encounter (Signed)
Called patient with no answer. Left message to call back. Will offer patient an appt with Dr Herbie [place] for 1/29 at 1:20 per Burt Knack

## 2023-02-22 NOTE — Telephone Encounter (Signed)
Wife said no one call for his telephone visit to please call [phone]

## 2023-02-23 NOTE — Telephone Encounter (Signed)
2nd attempt called patient with no answer. Left vm to call back

## 2023-02-26 NOTE — Telephone Encounter (Signed)
3rd attempt to contact patient. Left message to return call. Nurse will wait for patient to return call.

## 2023-03-29 ENCOUNTER — Encounter: Payer: Self-pay | Admitting: Urology

## 2023-03-29 ENCOUNTER — Ambulatory Visit: Payer: Medicare Other | Admitting: Urology

## 2023-03-29 VITALS — BP 123/71 | HR 69 | Ht 68.0 in | Wt 160.0 lb

## 2023-03-29 DIAGNOSIS — N368 Other specified disorders of urethra: Secondary | ICD-10-CM

## 2023-03-29 DIAGNOSIS — N400 Enlarged prostate without lower urinary tract symptoms: Secondary | ICD-10-CM

## 2023-03-29 LAB — URINALYSIS, COMPLETE
Bilirubin, UA: NEGATIVE
Glucose, UA: NEGATIVE
Leukocytes,UA: NEGATIVE
Nitrite, UA: NEGATIVE
RBC, UA: NEGATIVE
Specific Gravity, UA: >1.03 — ABNORMAL HIGH (ref 1.005–1.030)
Urobilinogen, Ur: 0.2 mg/dL (ref 0.2–1.0)
pH, UA: 5.5 (ref 5.0–7.5)

## 2023-03-29 LAB — MICROSCOPIC EXAMINATION

## 2023-03-29 NOTE — Progress Notes (Signed)
   03/29/23  CC: No chief complaint on file.   HPI: Refer to my office note 02/19/2023 for details.  No further episodes of urethral spotting  See rooming tab for vitals NED. A&Ox3.   No respiratory distress   Abd soft, NT, ND Normal phallus with bilateral descended testicles  Cystoscopy Procedure Note  Patient identification was confirmed, informed consent was obtained, and patient was prepped using Betadine solution.  Lidocaine jelly was administered per urethral meatus.     Pre-Procedure: - Inspection reveals a normal caliber urethral meatus.  Procedure: The flexible cystoscope was introduced without difficulty - No urethral strictures/lesions are present. -  TUR changes   prostate with adenoma regrowth distally.  BPH nodules with hypervascularity/friability -Open bladder neck - Bilateral ureteral orifices identified - Bladder mucosa  reveals no ulcers, tumors, or lesions - No bladder stones -Moderate trabeculation  Retroflexion shows open bladder neck, no anterior lesions   Post-Procedure: - Patient tolerated the procedure well  Assessment/ Plan: Urethral spotting secondary to BPH Instructed to call for recurrent spotting and if increasing and bothersome will start on a 5-ARI    Riki Altes, MD

## 2023-04-10 ENCOUNTER — Other Ambulatory Visit: Payer: Self-pay | Admitting: *Deleted

## 2023-04-10 ENCOUNTER — Telehealth: Payer: Self-pay | Admitting: Urology

## 2023-04-10 MED ORDER — GEMTESA 75 MG PO TABS: 75.0000 mg | ORAL_TABLET | Freq: Every day | ORAL | 3 refills | Status: DC

## 2023-04-10 NOTE — Telephone Encounter (Signed)
 Sent medication to [facility]

## 2023-04-10 NOTE — Telephone Encounter (Signed)
 PT wife came in requesting [facility] Prescription refill sent to [address] [facility] [address]

## 2023-04-17 ENCOUNTER — Telehealth: Payer: Self-pay

## 2023-04-17 NOTE — Telephone Encounter (Signed)
 Patient's wife, Mrs Sanden, left message on triage line stating that Leslye Peer is not covered by insurance per pharmacy and thought they would send [place] something. Not sure if it needs PA or just not covered all together. She would like a call back to let them know how to proceed.

## 2023-04-17 NOTE — Telephone Encounter (Signed)
 Talked to patient wife and advised her that we have not got a prior [place] for [place] yet. She states his insurance will not cover that medication . I advised the patient they could call the insurance and see what they will cover.

## 2023-04-18 NOTE — Telephone Encounter (Signed)
 Pts wife called - LM on triage line  Insurance will not cover Leslye Peer she states she called the insurance and has an alternative. Requested a returned call.   Called [place]- n/a. VM not set up.

## 2023-04-18 NOTE — Telephone Encounter (Signed)
 Per [employer] will cover the following-  Oxybutynin Vesicare   Pharmacy- [facility] [address]  Pls advise.

## 2023-04-19 NOTE — Telephone Encounter (Addendum)
 I would not recommend either those medications with his age.  They can have significant side effects of mental status changes and increased fall risk.  Also side effects of dry mouth and constipation.  Trospium 20 mg twice daily is a medication in that class which does not have the CNS side effects but can cause dry mouth and constipation it is generic and probably cheaper than Gemtesa using Good[facility].

## 2023-04-20 NOTE — Telephone Encounter (Signed)
 LMTRC

## 2023-04-23 ENCOUNTER — Ambulatory Visit (INDEPENDENT_AMBULATORY_CARE_PROVIDER_SITE_OTHER): Payer: Medicare Other | Admitting: Podiatry

## 2023-04-23 ENCOUNTER — Encounter: Payer: Self-pay | Admitting: Podiatry

## 2023-04-23 VITALS — Ht 68.0 in | Wt 160.0 lb

## 2023-04-23 DIAGNOSIS — M79676 Pain in unspecified toe(s): Secondary | ICD-10-CM

## 2023-04-23 DIAGNOSIS — B351 Tinea unguium: Secondary | ICD-10-CM | POA: Diagnosis not present

## 2023-04-23 DIAGNOSIS — E1159 Type 2 diabetes mellitus with other circulatory complications: Secondary | ICD-10-CM | POA: Diagnosis not present

## 2023-04-23 NOTE — Progress Notes (Signed)
  Subjective:  Patient ID: Victor [name], male    DOB: 1931-11-06,  MRN: [id number]  89 y.o. male presents to clinic with  at risk foot care. Pt has h/o NIDDM with PAD and painful, elongated thickened toenails x 10 which are symptomatic when wearing enclosed shoe gear. This interferes with his/her daily activities.  Chief Complaint  Patient presents with   Nail Problem    Pt is here for [facility] last A1C was 7.7 PCP is Dr Maryjane Hurter and LOV was in January.    New problem(s): None   PCP is Feldpausch, Madaline Guthrie, MD.  Allergies  Allergen Reactions   Azithromycin Rash and Anaphylaxis   Cephalosporins Anaphylaxis and Other (See Comments)   Codeine Anaphylaxis, Itching and Rash   Doxycycline Anaphylaxis   Phenylephrine-Guaifenesin     Unknown reaction    Pseudoephedrine Other (See Comments)    unknown    Amoxicillin Rash   Levofloxacin Rash   Penicillins Rash    Other reaction(s): Unknown   Sulfa Antibiotics Rash   Sulfasalazine Rash    Review of Systems: Negative except as noted in the HPI.   Objective:  Victor [name] is a pleasant 89 y.o. male WD, WN in NAD. AAO x 3.  Vascular Examination:  CFT <3 seconds b/l LE. Faintly palpable pedal pulses b/l LE. Pedal hair absent b/l LE. Skin temperature gradient WNL b/l. No pain with calf compression b/l. No edema b/l LE. No cyanosis or clubbing noted b/l LE.  Neurological Examination: Sensation grossly intact b/l with 10 gram monofilament. Vibratory sensation intact b/l.   Dermatological Examination: Pedal skin with normal turgor, texture and tone b/l. Toenails 1-5 b/l thick, discolored, elongated with subungual debris and pain on dorsal palpation. No hyperkeratotic lesions noted b/l.   Musculoskeletal Examination: Muscle strength 5/5 to b/l LE. HAV with bunion deformity noted b/l LE. Patient ambulates independent of any assistive aids.  Radiographs: None  Last A1c:       No data to display           Assessment:   1. Pain  due to onychomycosis of toenail   2. Type 2 diabetes mellitus with vascular disease (HCC)    Plan:  Patient was evaluated and treated. All patient's and/or POA's questions/concerns addressed on today's visit. Mycotic toenails 1-5 debrided in length and girth without incident.  Continue daily foot inspections and monitor blood glucose per PCP/Endocrinologist's recommendations.Continue soft, supportive shoe gear daily. Report any pedal injuries to medical professional. Call office if there are any quesitons/concerns. -Patient/POA to call should there be question/concern in the interim.  Return in about 3 months (around 07/24/2023).  Freddie Breech, DPM      [facility] LOCATION: [address], [place] [address]                   Office [phone]   [facility] LOCATION: [address] [place], [place] [address] Office [phone]

## 2023-04-24 ENCOUNTER — Other Ambulatory Visit: Payer: Self-pay

## 2023-04-24 MED ORDER — TROSPIUM CHLORIDE 20 MG PO TABS: 20.0000 mg | ORAL_TABLET | Freq: Two times a day (BID) | ORAL | 11 refills | Status: AC

## 2023-04-24 NOTE — Telephone Encounter (Signed)
 Spoke with patient wife Kathie Rhodes.  She agrees to medication.

## 2023-04-24 NOTE — Telephone Encounter (Signed)
 VM not set up.

## 2023-05-17 ENCOUNTER — Telehealth: Payer: Self-pay

## 2023-05-17 NOTE — Telephone Encounter (Signed)
 The patient saw [name] in December 2024 and 6 month echo/ follow up was planned. Called the patient to arrange echo/visit in June 2025 but he declined.  The patient saw Dr. Vavalle 03/07/2023 for second opinion for mTEER and Dr. Hollis Lurie agreed his anatomy is high risk and to continue medical therapy. Because of this, the patient does not wish to meet again with [name]. Scheduled him with Dr. [name] next available per patient request. He and his wife were grateful for call.

## 2023-06-15 ENCOUNTER — Encounter: Payer: Self-pay | Admitting: Cardiology

## 2023-06-15 ENCOUNTER — Ambulatory Visit: Attending: Cardiology | Admitting: Cardiology

## 2023-06-15 VITALS — BP 120/72 | HR 74 | Ht 68.0 in | Wt 148.6 lb

## 2023-06-15 DIAGNOSIS — I10 Essential (primary) hypertension: Secondary | ICD-10-CM | POA: Diagnosis present

## 2023-06-15 DIAGNOSIS — I340 Nonrheumatic mitral (valve) insufficiency: Secondary | ICD-10-CM | POA: Insufficient documentation

## 2023-06-15 DIAGNOSIS — I2510 Atherosclerotic heart disease of native coronary artery without angina pectoris: Secondary | ICD-10-CM | POA: Diagnosis present

## 2023-06-15 DIAGNOSIS — E785 Hyperlipidemia, unspecified: Secondary | ICD-10-CM | POA: Insufficient documentation

## 2023-06-15 DIAGNOSIS — I493 Ventricular premature depolarization: Secondary | ICD-10-CM | POA: Diagnosis present

## 2023-06-15 DIAGNOSIS — E1169 Type 2 diabetes mellitus with other specified complication: Secondary | ICD-10-CM | POA: Diagnosis present

## 2023-06-15 DIAGNOSIS — Z952 Presence of prosthetic heart valve: Secondary | ICD-10-CM | POA: Diagnosis present

## 2023-06-15 NOTE — Progress Notes (Signed)
 Cardiology Office Note:  .   Date:  06/16/2023  ID:  Victor [name], Victor [name] 30-Dec-1931, MRN [id number] PCP: Lorrie Rothman, MD  [facility] Providers Cardiologist:  Randene Bustard, MD     Chief Complaint  Patient presents with   Follow-up    62-month follow-up.  Doing relatively well   Cardiac Valve Problem    Severe MR noted:  Has been seen both by [name] from our structural heart program but also CT surgery and cardiology structural heart from [facility].   Coronary Artery Disease    No angina    Patient Profile: .     Amrit L Gentle - "Cotton" is a 89 y.o. male with a PMH noted below who presents here for 6-44-month follow-up.  He returns at the request of Lorrie Rothman, MD.  PMH notable for CAD, carotid disease, HTN, HLD and Critical AS-s/p TAVR (June 2021) who presents today for annual follow-up.  2008 CAD -> PCI RCA  Pre-TAVR Cath 05/2019: Patent RCA stent. pRCA ~55%, and 70% small D2 SEVERE AORTIC STENOSIS (progression to severe February 2021 -> mean gradient 42 mmHg.) ->  Became symptomatic early May 2021 with.  AVG up to 50 mmHg. TAVR procedure Date 07/29/2019: 29 mm Sapien prosthetic (TAVR) valve Severe MR (progression of disease noted in October 2024 Echo) -> not favorable for surgical repair or repair TEER      I last saw Cory Dingwall in October 2024 for routine follow-up.  He was doing better with his TAVR valve but had noted more prominent fatigue with exercise intolerance, not able to do his routine level of exercise.  We ordered a routine follow-up echocardiogram which showed stable TAVR valve but pretty significant mitral regurgitation and therefore he was referred for evaluation by the structural heart team-[name].  He was referred to see [name] on November 18 to discuss potential options for TEER to treat his mitral regurgitation.  At this time he was denying any dyspnea edema orthopnea palpitations etc.  No chest pain.  But just noted more or more  progressive fatigue.  Still working out of the gym however.  Just more fatigued when he returns home.  Prominent HSM noted on exam..  Felt to have severe MR by exam and by echo with functional class II CHF.  Options of palliative medical therapy, conventional surgical mitral repair versus MitraClip/TEER were reviewed.  Based on his age, would not have been felt to be favorable for open even minimally invasive MVR.  Was felt to potentially have suitable anatomy for TEER with, but was felt that he was not overly symptomatic. 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  Still denied significant symptoms other than fatigue.  On review of the TEE [name] felt that that the anatomy was not necessarily favorable for HERE as there was extensive myxomatous degeneration and flail of both P2 and P3 leaflets making it very technically difficult for successful edge-to-edge repair to reduce functional MR.  Favored ongoing surveillance.  He sought out second opinion at [facility], seen both by CT Surgery (Dr. Bernardine Bridegroom) as well as Cardiology (Dr. Coralie Derrick). => Was felt to be unfavorable for minimally invasive MVR by CT surgery due to age and ongoing comorbidities.  Dr. Vavalle also felt that he was a poor candidate for MitraClip based on the high risk anatomy and advocated for continue medical therapy.  Subjective  Discussed the use of AI scribe software for clinical note transcription with the patient, who gave verbal consent to proceed.  History of Present Illness History of Present Illness Edmundo L Fahr returns today with his wife for routine follow-up stating he is actually feeling relatively well.  Most notable issue is that he has been experiencing balance issues, described as 'stumbling' and 'balance is off', with associated leg weakness that limits his walking.   He actually is pretty active-able to perform daily  activities and exercises three times a week, including full body workouts with machines and free weights, without experiencing shortness of breath or chest pain.  No chest pain, pressure, or tightness. No shortness of breath or swelling in his legs. No symptoms of heart failure such as orthopnea, paroxysmal nocturnal dyspnea, or significant edema. He sleeps on one pillow and does not wake up short of breath.  He also denies any rapid irregular heartbeats or palpitations.  Nothing to suggest valvular arrhythmia.  He does acknowledge that his energy level is just not where it used to be but has concluded that this is probably just related to his advanced age. He has a history of essential tremor, which has been noted by others, but he is not currently on medication for it due to potential interactions with his existing medications.  His current medications include aspirin , Zetia , CoQ10, rosuvastatin , and glipizide 5 mg for diabetes. He also takes Gemtesa  75 mg for benign prostatic hyperplasia (BPH). He is not on any fluid pills or blood pressure medications.    Objective   Pertinent Cardiac Meds: Medications - Aspirin  81 mg daily - Zetia  10 mg daily;- Rosuvastatin  20 mg daily; -omega-3 1200 mg caps-2 tabs twice daily;- CoQ10 100 mg daily - Glipizide 5 mg - Gemzar 75 mg   Studies Reviewed: [name]   EKG Interpretation Date/Time:  Friday Jun 15 2023 11:40:08 EDT Ventricular Rate:  74 PR Interval:  226 QRS Duration:  86 QT Interval:  382 QTC Calculation: 424 R Axis:   -37  Text Interpretation: Sinus rhythm with marked sinus arrhythmia with 1st degree A-V block Left axis deviation Anteroseptal infarct (cited on or before 24-Nov-2022) When compared with ECG of 18-Dec-2022 12:14, Sinus rhythm has replaced Junctional rhythm Questionable change in initial forces of Anterior leads Confirmed by Randene Bustard ([id number]) on 06/15/2023 12:26:54 PM    TTE 12/13/2022: Normal LVEF at 60 to 65%.  No RWMA.  GR 1 DD.   Normal RV size with moderately elevated PAP estimated 46 mmHg.  Mild LA dilation.  Severe MR with holosystolic prolapse of the MV.  Moderate MAC.  Repaired aortic valve with TAVR in place.  Mean gradient 11 mmHg.  Normal RAP.  TEE 12/29/2022: LVEF 55%.  No RWMA.  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  Systolic reversal of flow in the right upper pulmonary vein.  No stenosis.  Lab Results  Component Value Date   CHOL 127 03/22/2011   HDL 48 03/22/2011   LDLCALC 52 03/22/2011   TRIG 133 03/22/2011   CHOLHDL 2.6 03/22/2011     Risk Assessment/Calculations:              Physical Exam:   VS:  BP 120/72 (BP Location: Left Arm, Patient Position: Sitting, Cuff Size: Normal)   Pulse 74   Ht 5\' 8"  (1.727 m)   Wt 148 lb 9.6 oz (67.4 kg)   SpO2 93%   BMI 22.59 kg/m  Wt Readings from Last 3 Encounters:  06/15/23 148 lb 9.6 oz (67.4 kg)  04/23/23 160 lb (72.6 kg)  03/29/23 160 lb (72.6 kg)    GEN: Well nourished, well groomed in no acute distress; appears very healthy for stated age.  Has lost weight but still seems robust.  In good spirits.  ANO x 3.  Memory is somewhat off but stable. NECK: No JVD; No carotid bruits CARDIAC: Normal S1, S2; RRR, no murmurs, rubs, gallops RESPIRATORY:  Clear to auscultation without rales, wheezing or rhonchi ; nonlabored, good air movement. ABDOMEN: Soft, non-tender, non-distended EXTREMITIES:  No edema; No deformity      ASSESSMENT AND PLAN: .    Problem List Items Addressed This Visit       Cardiology Problems   Atherosclerotic heart disease of native coronary artery without angina pectoris (Chronic)   No further angina post PCI.  Is on statin and Zetia  along with aspirin . Continue current dose of 20 mg rosuvastatin  at 10 mg with Zetia . Okay to hold aspirin  if necessary for procedures or surgeries.      Essential hypertension (Chronic)   BP stable.  He is no longer on any medications.      Relevant Orders   EKG  12-Lead (Completed)   Hyperlipidemia associated with type 2 diabetes mellitus (HCC) (Chronic)   I do not have recent labs available but he remains on stable dose of rosuvastatin  20 mg daily and Zetia  10 mg daily. Most recent labs 5 cm were from 2024 with LDL of 40. Is on glipizide for diabetes and A1c has been well-controlled.  Monitored by PCP.      Severe mitral valve regurgitation - Primary (Chronic)   Severe MR noted-has been referred for evaluation of possible repair and not felt to be a good candidate.  Continue to monitor for symptoms and treat accordingly. Monitor for arrhythmias and signs of heart failure.      Symptomatic PVCs; history of   No longer symptomatic.  No longer on beta-blocker.        Other   S/P TAVR (transcatheter aortic valve replacement) (Chronic)   Doing well status post TAVR.  Both echo and TEE revealed stable normal function with normal gradient. - Routine surveillance of aortic valve function. - Discussed SBE prophylaxis.  Has clindamycin  ordered.            Follow-Up: Return in about 6 months (around 12/16/2023) for Routine follow up with me, [employer]. I spent 50 minutes in the care of Johsua L Wolpert today including reviewing studies (echo and TEE reviewed-4 minutes), face to face time discussing treatment options (23 minutes), reviewing records from consultation notes from [name] as well as return notes from [facility] (10 minutes), 13 minutes dictating, and documenting in the encounter.      Signed, Arleen Lacer, MD, MS Randene Bustard, M.D., M.S. Interventional Chartered certified accountant  Pager # [phone]

## 2023-06-15 NOTE — Patient Instructions (Addendum)
 Medication Instructions:  No changes  *If you need a refill on your cardiac medications before your next appointment, please call your pharmacy*   Lab Work: Not needed If you have labs (blood work) drawn today and your tests are completely normal, you will receive your results only by: MyChart Message (if you have MyChart) OR A paper copy in the mail If you have any lab test that is abnormal or we need to change your treatment, we will call you to review the results.   Testing/Procedures:  Not needed  Follow-Up: At [facility], you and your health needs are our priority.  As part of our continuing mission to provide you with exceptional heart care, we have created designated Provider Care Teams.  These Care Teams include your primary Cardiologist (physician) and Advanced Practice Providers (APPs -  Physician Assistants and Nurse Practitioners) who all work together to provide you with the care you need, when you need it.     Your next appointment:   6 month(s)  The format for your next appointment:   In Person  Provider:    [facility] - APP [place], [place]onna [place],---  Then, Randene Bustard, MD will plan to see you again in 12 month(s).

## 2023-06-16 ENCOUNTER — Encounter: Payer: Self-pay | Admitting: Cardiology

## 2023-06-16 DIAGNOSIS — I340 Nonrheumatic mitral (valve) insufficiency: Secondary | ICD-10-CM | POA: Insufficient documentation

## 2023-06-16 NOTE — Assessment & Plan Note (Signed)
 Severe MR noted-has been referred for evaluation of possible repair and not felt to be a good candidate.  Continue to monitor for symptoms and treat accordingly. Monitor for arrhythmias and signs of heart failure.

## 2023-06-16 NOTE — Assessment & Plan Note (Signed)
 No further angina post PCI.  Is on statin and Zetia  along with aspirin . Continue current dose of 20 mg rosuvastatin  at 10 mg with Zetia . Okay to hold aspirin  if necessary for procedures or surgeries.

## 2023-06-16 NOTE — Assessment & Plan Note (Signed)
 Doing well status post TAVR.  Both echo and TEE revealed stable normal function with normal gradient. - Routine surveillance of aortic valve function. - Discussed SBE prophylaxis.  Has clindamycin  ordered.

## 2023-06-16 NOTE — Assessment & Plan Note (Signed)
 I do not have recent labs available but he remains on stable dose of rosuvastatin  20 mg daily and Zetia  10 mg daily. Most recent labs 5 cm were from 2024 with LDL of 40. Is on glipizide for diabetes and A1c has been well-controlled.  Monitored by PCP.

## 2023-06-16 NOTE — Assessment & Plan Note (Signed)
 BP stable.  He is no longer on any medications.

## 2023-06-16 NOTE — Assessment & Plan Note (Signed)
 No longer symptomatic.  No longer on beta-blocker.

## 2023-06-25 ENCOUNTER — Emergency Department

## 2023-06-25 ENCOUNTER — Emergency Department
Admission: EM | Admit: 2023-06-25 | Discharge: 2023-06-25 | Disposition: A | Discharge status: Home / Self Care | Attending: Emergency Medicine | Admitting: Emergency Medicine

## 2023-06-25 ENCOUNTER — Other Ambulatory Visit: Payer: Self-pay

## 2023-06-25 DIAGNOSIS — R5383 Other fatigue: Secondary | ICD-10-CM | POA: Diagnosis present

## 2023-06-25 DIAGNOSIS — R531 Weakness: Secondary | ICD-10-CM

## 2023-06-25 DIAGNOSIS — N200 Calculus of kidney: Secondary | ICD-10-CM | POA: Insufficient documentation

## 2023-06-25 DIAGNOSIS — I2510 Atherosclerotic heart disease of native coronary artery without angina pectoris: Secondary | ICD-10-CM | POA: Insufficient documentation

## 2023-06-25 DIAGNOSIS — K8020 Calculus of gallbladder without cholecystitis without obstruction: Secondary | ICD-10-CM | POA: Diagnosis not present

## 2023-06-25 DIAGNOSIS — E119 Type 2 diabetes mellitus without complications: Secondary | ICD-10-CM | POA: Insufficient documentation

## 2023-06-25 DIAGNOSIS — I6381 Other cerebral infarction due to occlusion or stenosis of small artery: Secondary | ICD-10-CM | POA: Diagnosis not present

## 2023-06-25 DIAGNOSIS — I10 Essential (primary) hypertension: Secondary | ICD-10-CM | POA: Insufficient documentation

## 2023-06-25 LAB — URINALYSIS, W/ REFLEX TO CULTURE (INFECTION SUSPECTED)
Bacteria, UA: NONE SEEN
Bilirubin Urine: NEGATIVE
Glucose, UA: NEGATIVE mg/dL
Hgb urine dipstick: NEGATIVE
Ketones, ur: NEGATIVE mg/dL
Leukocytes,Ua: NEGATIVE
Nitrite: NEGATIVE
Protein, ur: NEGATIVE mg/dL
Specific Gravity, Urine: 1.024 (ref 1.005–1.030)
Squamous Epithelial / HPF: 0 /HPF (ref 0–5)
pH: 6 (ref 5.0–8.0)

## 2023-06-25 LAB — CBC WITH DIFFERENTIAL/PLATELET
Abs Immature Granulocytes: 0.01 10*3/uL (ref 0.00–0.07)
Basophils Absolute: 0 10*3/uL (ref 0.0–0.1)
Basophils Relative: 0 %
Eosinophils Absolute: 0.1 10*3/uL (ref 0.0–0.5)
Eosinophils Relative: 1 %
HCT: 38.6 % — ABNORMAL LOW (ref 39.0–52.0)
Hemoglobin: 12.6 g/dL — ABNORMAL LOW (ref 13.0–17.0)
Immature Granulocytes: 0 %
Lymphocytes Relative: 39 %
Lymphs Abs: 2.5 10*3/uL (ref 0.7–4.0)
MCH: 31.9 pg (ref 26.0–34.0)
MCHC: 32.6 g/dL (ref 30.0–36.0)
MCV: 97.7 fL (ref 80.0–100.0)
Monocytes Absolute: 0.5 10*3/uL (ref 0.1–1.0)
Monocytes Relative: 9 %
Neutro Abs: 3.2 10*3/uL (ref 1.7–7.7)
Neutrophils Relative %: 51 %
Platelets: 122 10*3/uL — ABNORMAL LOW (ref 150–400)
RBC: 3.95 MIL/uL — ABNORMAL LOW (ref 4.22–5.81)
RDW: 13.1 % (ref 11.5–15.5)
WBC: 6.3 10*3/uL (ref 4.0–10.5)
nRBC: 0 % (ref 0.0–0.2)

## 2023-06-25 LAB — COMPREHENSIVE METABOLIC PANEL WITH GFR
ALT: 25 U/L (ref 0–44)
AST: 27 U/L (ref 15–41)
Albumin: 3.3 g/dL — ABNORMAL LOW (ref 3.5–5.0)
Alkaline Phosphatase: 48 U/L (ref 38–126)
Anion gap: 9 (ref 5–15)
BUN: 17 mg/dL (ref 8–23)
CO2: 27 mmol/L (ref 22–32)
Calcium: 8 mg/dL — ABNORMAL LOW (ref 8.9–10.3)
Chloride: 104 mmol/L (ref 98–111)
Creatinine, Ser: 0.85 mg/dL (ref 0.61–1.24)
GFR, Estimated: >60 mL/min (ref >60)
Glucose, Bld: 192 mg/dL — ABNORMAL HIGH (ref 70–99)
Potassium: 4 mmol/L (ref 3.5–5.1)
Sodium: 140 mmol/L (ref 135–145)
Total Bilirubin: 0.5 mg/dL (ref 0.0–1.2)
Total Protein: 5.9 g/dL — ABNORMAL LOW (ref 6.5–8.1)

## 2023-06-25 LAB — BLOOD GAS, VENOUS
Acid-Base Excess: 5.6 mmol/L — ABNORMAL HIGH (ref 0.0–2.0)
Bicarbonate: 32.8 mmol/L — ABNORMAL HIGH (ref 20.0–28.0)
O2 Saturation: 51.7 %
Patient temperature: 37
pCO2, Ven: 58 mmHg (ref 44–60)
pH, Ven: 7.36 (ref 7.25–7.43)
pO2, Ven: 33 mmHg (ref 32–45)

## 2023-06-25 LAB — OSMOLALITY: Osmolality: 300 mosm/kg — ABNORMAL HIGH (ref 275–295)

## 2023-06-25 LAB — TROPONIN I (HIGH SENSITIVITY)
Troponin I (High Sensitivity): 8 ng/L (ref <18)
Troponin I (High Sensitivity): 6 ng/L (ref <18)

## 2023-06-25 LAB — TSH: TSH: 2.804 u[IU]/mL (ref 0.350–4.500)

## 2023-06-25 LAB — BETA-HYDROXYBUTYRIC ACID: Beta-Hydroxybutyric Acid: 0.2 mmol/L (ref 0.05–0.27)

## 2023-06-25 LAB — CBG MONITORING, ED: Glucose-Capillary: 211 mg/dL — ABNORMAL HIGH (ref 70–99)

## 2023-06-25 MED ORDER — IOHEXOL 300 MG/ML  SOLN
100.0000 mL | Freq: Once | INTRAMUSCULAR | Status: AC | PRN
  Administered 2023-06-25: 100 mL via INTRAVENOUS

## 2023-06-25 MED ORDER — SODIUM CHLORIDE 0.9 % IV BOLUS
500.0000 mL | Freq: Once | INTRAVENOUS | Status: AC
  Administered 2023-06-25: 500 mL via INTRAVENOUS

## 2023-06-25 MED ORDER — LACTATED RINGERS IV BOLUS: 500.0000 mL | Freq: Once | INTRAVENOUS | Status: DC

## 2023-06-25 NOTE — ED Provider Notes (Signed)
 [facility] Provider Note    Event Date/Time   First MD Initiated Contact with Patient 06/25/23 1531     (approximate)   History   Fatigue and Hyperglycemia   HPI  Gustavus L Kem is a 89 y.o. male with history of dementia, hypertension, hyperlipidemia, diabetes, BPH, presenting with fatigue.  Per EMS wife called because she thought his blood glucose was low, he given him orange juice and candy bars, when they read it it was noted to be high.  They reported that patient is currently at his mental baseline, but wife was concerned because he has been very fatigued today.  States that this has been going on since 9 AM this morning.  States that patient went to get lunch outside, came back and has been lying in bed.  Also reported that patient slid out of his chair onto the floor yesterday.  He says that he fell on his bottom, thinks he did not hit his head.  He denies any chest pain or shortness of breath, no headache or vision changes, no new weakness or numbness, no urinary symptoms.  Does have some abdominal pain.  Independent history obtained from EMS as above.  On independent chart review, he was seen by cardiology in mid May, history of CAD, history of TAVR, has been noted to have increased fatigue with exercise intolerance.     Physical Exam   Triage Vital Signs: ED Triage Vitals  Encounter Vitals Group     BP      Systolic BP Percentile      Diastolic BP Percentile      Pulse      Resp      Temp      Temp src      SpO2      Weight      Height      Head Circumference      Peak Flow      Pain Score      Pain Loc      Pain Education      Exclude from Growth Chart     Most recent vital signs: Vitals:   06/25/23 1730 06/25/23 1800  BP:    Pulse: 78 68  Resp: 13 13  Temp:    SpO2: 100% 100%     General: Awake, no distress.  ANO x 3 CV:  Good peripheral perfusion.  Resp:  Normal effort.  Clear Abd:  No distention.  Soft, tender in  the lower quadrants. Other:  Pupils are equal and reactive, extraocular movements are intact, no cranial nerve deficits, no focal weakness or numbness.  He has no lower extremity edema, no unilateral calf swelling or tenderness.  No palpable skull deformities or tenderness, no midline spinal tenderness, no bony tenderness to his extremities, thoracic cage is nontender   ED Results / Procedures / Treatments   Labs (all labs ordered are listed, but only abnormal results are displayed) Labs Reviewed  COMPREHENSIVE METABOLIC PANEL WITH GFR - Abnormal; Notable for the following components:      Result Value   Glucose, Bld 192 (*)    Calcium  8.0 (*)    Total Protein 5.9 (*)    Albumin 3.3 (*)    All other components within normal limits  CBC WITH DIFFERENTIAL/PLATELET - Abnormal; Notable for the following components:   RBC 3.95 (*)    Hemoglobin 12.6 (*)    HCT 38.6 (*)    Platelets 122 (*)  All other components within normal limits  URINALYSIS, W/ REFLEX TO CULTURE (INFECTION SUSPECTED) - Abnormal; Notable for the following components:   Color, Urine STRAW (*)    APPearance CLEAR (*)    All other components within normal limits  BLOOD GAS, VENOUS - Abnormal; Notable for the following components:   Bicarbonate 32.8 (*)    Acid-Base Excess 5.6 (*)    All other components within normal limits  OSMOLALITY - Abnormal; Notable for the following components:   Osmolality 300 (*)    All other components within normal limits  CBG MONITORING, ED - Abnormal; Notable for the following components:   Glucose-Capillary 211 (*)    All other components within normal limits  BETA-HYDROXYBUTYRIC ACID  TSH  TROPONIN I (HIGH SENSITIVITY)  TROPONIN I (HIGH SENSITIVITY)     EKG  EKG shows, sinus rhythm, rate 78, normal QS, normal QTc, no ischemic ST elevation, T wave flattening in 3, aVF, not significant change compared to prior   RADIOLOGY On my independent interpretation, CT head without  obvious intracranial hemorrhage   PROCEDURES:  Critical Care performed: No  Procedures   MEDICATIONS ORDERED IN ED: Medications  sodium chloride  0.9 % bolus 500 mL (0 mLs Intravenous Stopped 06/25/23 1702)  iohexol  (OMNIPAQUE ) 300 MG/ML solution 100 mL (100 mLs Intravenous Contrast Given 06/25/23 1626)     IMPRESSION / MDM / ASSESSMENT AND PLAN / ED COURSE  I reviewed the triage vital signs and the nursing notes.                              Differential diagnosis includes, but is not limited to, electrolyte derangements, dehydration, hyperglycemia, DKA, atypical ACS, occult infection, colitis, diverticulitis, for the fall, he has no midline spinal tenderness, will get a CT head and neck to make sure no acute fracture or intracranial hemorrhage.  Also get labs, EKG, troponin, UA.  Chest x-ray, CT abdomen pelvis, CT head and cervical spine.  Will given some IV fluids here.  Patient's presentation is most consistent with acute presentation with potential threat to life or bodily function.  Independent interpretation of labs and imaging below.  CT head shows an age indeterminant lacunar infarct, he has no focal deficits on exam at this time, suspect this is nonacute.  Discussed with patient and family about labs as well as imaging including incidental findings, offered MRI here but they would prefer to wait and follow-up with his neurologist tomorrow, patient has a neurology appointment with Dr. Walden Guise tomorrow.  Daughter states that patient is ambulatory with a cane at home, he was able to walk unassisted, slightly wobbly but he was not using a cane or walker at the time.  Otherwise he is feeling better.  Considered but no indication for inpatient admission at this time, he is safe for outpatient management.  Discussed with family about keeping him hydrated and outpatient follow-up.  They are agreeable with this plan.  Will discharge with strict return precautions.  The patient is on the  cardiac monitor to evaluate for evidence of arrhythmia and/or significant heart rate changes.   Clinical Course as of 06/25/23 1817  Mon Jun 25, 2023  1612 DG Chest 1 View 1. No acute intrathoracic process.  [TT]  1621 Independent review of labs, electrolyte severely deranged, LFTs are normal, creatinine is normal, pH on VBG is normal, no leukocytosis, beta hydroxybutyrate is not elevated, troponin is negative. [TT]  1705 CT ABDOMEN  PELVIS W CONTRAST IMPRESSION: No acute findings.  Colonic diverticulosis, without radiographic evidence of diverticulitis.  Tiny right renal calculi. No evidence of ureteral calculi or hydronephrosis.  Cholelithiasis. No radiographic evidence of cholecystitis.   [TT]  1727 CT Head Wo Contrast IMPRESSION: 1. Age indeterminate lacunar infarct in the lateral right thalamus, non-hemorrhagic. 2. No other acute intracranial abnormality related to the minor head trauma. 3. Moderate periventricular white matter hypoattenuation, progressed since the prior exam. 4. 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   [TT]  1727 CT Cervical Spine Wo Contrast IMPRESSION: 1. No acute fracture or traumatic malalignment. 2. 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 3. Multilevel degenerative disc disease in the cervical spine as described   [TT]  1746 Urinalysis, w/ Reflex to Culture (Infection Suspected) -Urine, Clean Catch(!) Not consistent with UTI [TT]  1814 Troponin I (High Sensitivity) Troponin x 2 is negative. [TT]    Clinical Course User Index [TT] Drenda Gentle, Richard Champion, MD     FINAL CLINICAL IMPRESSION(S) / ED DIAGNOSES   Final diagnoses:  Other fatigue  Weakness  Renal stones  Gallstones  Lacunar infarction [facility])     Rx / DC Orders   ED Discharge Orders     None        Note:  This document was prepared using Dragon voice recognition software and may include unintentional dictation errors.    Shane Darling, MD 06/25/23 [phone]

## 2023-06-25 NOTE — Discharge Instructions (Signed)
 Please make sure to go to your neurologist appointment tomorrow.  Please make sure to keep yourself hydrated.

## 2023-06-25 NOTE — ED Notes (Signed)
 Patient transported to CT

## 2023-06-25 NOTE — ED Notes (Signed)
 Pt ambulated in hallway

## 2023-06-25 NOTE — ED Triage Notes (Signed)
 Pt to ED via [facility] for c/o lethargy since 900. Pt's wife assumed glucose was low and patient orange juice and candy bars, Cbg read "high" for EMS. Pt has hx dementia.

## 2023-07-26 ENCOUNTER — Emergency Department

## 2023-07-26 ENCOUNTER — Other Ambulatory Visit: Payer: Self-pay

## 2023-07-26 ENCOUNTER — Encounter: Payer: Self-pay | Admitting: Podiatry

## 2023-07-26 ENCOUNTER — Emergency Department
Admission: EM | Admit: 2023-07-26 | Discharge: 2023-07-27 | Disposition: A | Discharge status: Home / Self Care | Attending: Emergency Medicine | Admitting: Emergency Medicine

## 2023-07-26 ENCOUNTER — Ambulatory Visit (INDEPENDENT_AMBULATORY_CARE_PROVIDER_SITE_OTHER): Admitting: Podiatry

## 2023-07-26 DIAGNOSIS — W19XXXA Unspecified fall, initial encounter: Secondary | ICD-10-CM

## 2023-07-26 DIAGNOSIS — M25512 Pain in left shoulder: Secondary | ICD-10-CM | POA: Diagnosis not present

## 2023-07-26 DIAGNOSIS — E1159 Type 2 diabetes mellitus with other circulatory complications: Secondary | ICD-10-CM

## 2023-07-26 DIAGNOSIS — I10 Essential (primary) hypertension: Secondary | ICD-10-CM | POA: Insufficient documentation

## 2023-07-26 DIAGNOSIS — I2510 Atherosclerotic heart disease of native coronary artery without angina pectoris: Secondary | ICD-10-CM | POA: Insufficient documentation

## 2023-07-26 DIAGNOSIS — W1839XA Other fall on same level, initial encounter: Secondary | ICD-10-CM | POA: Insufficient documentation

## 2023-07-26 DIAGNOSIS — M25552 Pain in left hip: Secondary | ICD-10-CM | POA: Insufficient documentation

## 2023-07-26 DIAGNOSIS — M79676 Pain in unspecified toe(s): Secondary | ICD-10-CM | POA: Diagnosis not present

## 2023-07-26 DIAGNOSIS — B351 Tinea unguium: Secondary | ICD-10-CM

## 2023-07-26 DIAGNOSIS — E119 Type 2 diabetes mellitus without complications: Secondary | ICD-10-CM | POA: Diagnosis not present

## 2023-07-26 LAB — COMPREHENSIVE METABOLIC PANEL WITH GFR
ALT: 26 U/L (ref 0–44)
AST: 30 U/L (ref 15–41)
Albumin: 3.9 g/dL (ref 3.5–5.0)
Alkaline Phosphatase: 55 U/L (ref 38–126)
Anion gap: 7 (ref 5–15)
BUN: 27 mg/dL — ABNORMAL HIGH (ref 8–23)
CO2: 26 mmol/L (ref 22–32)
Calcium: 9.1 mg/dL (ref 8.9–10.3)
Chloride: 105 mmol/L (ref 98–111)
Creatinine, Ser: 0.83 mg/dL (ref 0.61–1.24)
GFR, Estimated: >60 mL/min (ref >60)
Glucose, Bld: 161 mg/dL — ABNORMAL HIGH (ref 70–99)
Potassium: 4.1 mmol/L (ref 3.5–5.1)
Sodium: 138 mmol/L (ref 135–145)
Total Bilirubin: 0.7 mg/dL (ref 0.0–1.2)
Total Protein: 6.5 g/dL (ref 6.5–8.1)

## 2023-07-26 LAB — CBC
HCT: 40.5 % (ref 39.0–52.0)
Hemoglobin: 13.6 g/dL (ref 13.0–17.0)
MCH: 32 pg (ref 26.0–34.0)
MCHC: 33.6 g/dL (ref 30.0–36.0)
MCV: 95.3 fL (ref 80.0–100.0)
Platelets: 131 10*3/uL — ABNORMAL LOW (ref 150–400)
RBC: 4.25 MIL/uL (ref 4.22–5.81)
RDW: 13.2 % (ref 11.5–15.5)
WBC: 8.8 10*3/uL (ref 4.0–10.5)
nRBC: 0 % (ref 0.0–0.2)

## 2023-07-26 LAB — CBG MONITORING, ED: Glucose-Capillary: 70 mg/dL (ref 70–99)

## 2023-07-26 LAB — URINALYSIS, ROUTINE W REFLEX MICROSCOPIC
Bilirubin Urine: NEGATIVE
Glucose, UA: NEGATIVE mg/dL
Hgb urine dipstick: NEGATIVE
Ketones, ur: NEGATIVE mg/dL
Leukocytes,Ua: NEGATIVE
Nitrite: NEGATIVE
Protein, ur: NEGATIVE mg/dL
Specific Gravity, Urine: 1.025 (ref 1.005–1.030)
pH: 5 (ref 5.0–8.0)

## 2023-07-26 MED ORDER — EZETIMIBE 10 MG PO TABS
10.0000 mg | ORAL_TABLET | Freq: Every day | ORAL | Status: DC
  Administered 2023-07-27: 10 mg via ORAL
  Filled 2023-07-26: qty 1

## 2023-07-26 MED ORDER — MEMANTINE HCL 5 MG PO TABS
5.0000 mg | ORAL_TABLET | Freq: Every day | ORAL | Status: DC
  Administered 2023-07-27: 5 mg via ORAL
  Filled 2023-07-26: qty 1

## 2023-07-26 MED ORDER — FESOTERODINE FUMARATE ER 4 MG PO TB24
4.0000 mg | ORAL_TABLET | Freq: Every day | ORAL | Status: DC
  Administered 2023-07-27: 4 mg via ORAL
  Filled 2023-07-26: qty 1

## 2023-07-26 MED ORDER — ROSUVASTATIN CALCIUM 20 MG PO TABS
20.0000 mg | ORAL_TABLET | Freq: Every day | ORAL | Status: DC
  Administered 2023-07-27: 20 mg via ORAL
  Filled 2023-07-26: qty 1

## 2023-07-26 MED ORDER — DONEPEZIL HCL 5 MG PO TABS
10.0000 mg | ORAL_TABLET | Freq: Every day | ORAL | Status: DC
  Administered 2023-07-27: 10 mg via ORAL
  Filled 2023-07-26: qty 2

## 2023-07-26 MED ORDER — ASPIRIN 81 MG PO TBEC
81.0000 mg | DELAYED_RELEASE_TABLET | Freq: Every day | ORAL | Status: DC
  Administered 2023-07-27: 81 mg via ORAL
  Filled 2023-07-26: qty 1

## 2023-07-26 MED ORDER — GABAPENTIN 300 MG PO CAPS: 300.0000 mg | ORAL_CAPSULE | Freq: Every day | ORAL | Status: DC

## 2023-07-26 MED ORDER — GLUCOSAMINE HCL 1000 MG PO TABS: 1000.0000 mg | ORAL_TABLET | Freq: Two times a day (BID) | ORAL | Status: DC

## 2023-07-26 MED ORDER — VITAMIN B-12 1000 MCG PO TABS
2500.0000 ug | ORAL_TABLET | Freq: Every day | ORAL | Status: DC
  Administered 2023-07-27: 2500 ug via ORAL
  Filled 2023-07-26: qty 2.5

## 2023-07-26 MED ORDER — RISAQUAD PO CAPS
1.0000 | ORAL_CAPSULE | Freq: Every day | ORAL | Status: DC
  Administered 2023-07-27: 1 via ORAL
  Filled 2023-07-26: qty 1

## 2023-07-26 MED ORDER — ADULT MULTIVITAMIN W/MINERALS CH
1.0000 | ORAL_TABLET | Freq: Two times a day (BID) | ORAL | Status: DC
  Administered 2023-07-27 (×2): 1 via ORAL
  Filled 2023-07-26 (×2): qty 1

## 2023-07-26 MED ORDER — DIVALPROEX SODIUM 125 MG PO DR TAB
125.0000 mg | DELAYED_RELEASE_TABLET | Freq: Two times a day (BID) | ORAL | Status: DC
  Administered 2023-07-27 (×2): 125 mg via ORAL
  Filled 2023-07-26 (×2): qty 1

## 2023-07-26 MED ORDER — ACETAMINOPHEN 325 MG PO TABS
650.0000 mg | ORAL_TABLET | Freq: Once | ORAL | Status: AC
  Administered 2023-07-26: 650 mg via ORAL
  Filled 2023-07-26: qty 2

## 2023-07-26 MED ORDER — VITAMIN D 25 MCG (1000 UNIT) PO TABS
4000.0000 [IU] | ORAL_TABLET | Freq: Two times a day (BID) | ORAL | Status: DC
  Administered 2023-07-27: 4000 [IU] via ORAL
  Filled 2023-07-26: qty 4

## 2023-07-26 MED ORDER — VITAMIN C 500 MG PO TABS: 1000.0000 mg | ORAL_TABLET | ORAL | Status: DC

## 2023-07-26 MED ORDER — MIRABEGRON ER 25 MG PO TB24
25.0000 mg | ORAL_TABLET | Freq: Every evening | ORAL | Status: DC
  Filled 2023-07-26: qty 1

## 2023-07-26 MED ORDER — GLIPIZIDE ER 5 MG PO TB24
5.0000 mg | ORAL_TABLET | Freq: Every day | ORAL | Status: DC
  Administered 2023-07-27: 5 mg via ORAL
  Filled 2023-07-26: qty 1

## 2023-07-26 MED ORDER — POLYVINYL ALCOHOL 1.4 % OP SOLN
1.0000 [drp] | Freq: Every day | OPHTHALMIC | Status: DC
  Filled 2023-07-26: qty 15

## 2023-07-26 MED ORDER — OMEGA-3-ACID ETHYL ESTERS 1 G PO CAPS
2000.0000 mg | ORAL_CAPSULE | Freq: Two times a day (BID) | ORAL | Status: DC
  Administered 2023-07-27: 2000 mg via ORAL
  Filled 2023-07-26: qty 2

## 2023-07-26 MED ORDER — COQ-10 100 MG PO CAPS: 100.0000 mg | ORAL_CAPSULE | Freq: Every day | ORAL | Status: DC

## 2023-07-26 MED ORDER — MIRTAZAPINE 15 MG PO TABS
15.0000 mg | ORAL_TABLET | Freq: Every day | ORAL | Status: DC
  Administered 2023-07-27: 15 mg via ORAL
  Filled 2023-07-26: qty 1

## 2023-07-26 NOTE — ED Triage Notes (Signed)
 Pt to ED via [facility] from home. Pt reports was feeding his dogs and bent over and legs gave out. Pt reports back and left shoulder pain. No LOC or blood thinner.

## 2023-07-26 NOTE — ED Provider Notes (Signed)
 [facility] Provider Note    Event Date/Time   First MD Initiated Contact with Patient 07/26/23 1626     (approximate)   History   Fall   HPI  Victor [name] is a 89 y.o. male with PMH of CAD, hypertension, diabetes, aortic stenosis s/p TAVR presents for evaluation after a fall.  Patient was bending over to take his dog off the leash when his legs gave out.  Patient fell landing on his left hip and left shoulder.  Patient did not hit his head no loss of consciousness.      Physical Exam   Triage Vital Signs: ED Triage Vitals  Encounter Vitals Group     BP 07/26/23 1504 113/65     Girls Systolic BP Percentile --      Girls Diastolic BP Percentile --      Boys Systolic BP Percentile --      Boys Diastolic BP Percentile --      Pulse Rate 07/26/23 1504 (!) 108     Resp 07/26/23 1504 20     Temp 07/26/23 1504 98.6 F (37 C)     Temp Source 07/26/23 1504 Oral     SpO2 07/26/23 1504 98 %     Weight --      Height --      Head Circumference --      Peak Flow --      Pain Score 07/26/23 1502 5     Pain Loc --      Pain Education --      Exclude from Growth Chart --     Most recent vital signs: Vitals:   07/26/23 1504  BP: 113/65  Pulse: (!) 108  Resp: 20  Temp: 98.6 F (37 C)  SpO2: 98%   General: Awake, no distress.  CV:  Good peripheral perfusion. RRR, appreciable murmur. Resp:  Normal effort. CTAB. Abd:  No distention.  Other:  Mild TTP over the shoulder, ROM well maintained and strength is equal bilaterally, mild TTP over the greater trochanter, no pain with rocking of the pelvis, patient is unable to stand up from the side of the bed unassisted and is very unsteady standing bedside, cannot take a step   ED Results / Procedures / Treatments   Labs (all labs ordered are listed, but only abnormal results are displayed) Labs Reviewed  COMPREHENSIVE METABOLIC PANEL WITH GFR - Abnormal; Notable for the following components:       Result Value   Glucose, Bld 161 (*)    BUN 27 (*)    All other components within normal limits  CBC - Abnormal; Notable for the following components:   Platelets 131 (*)    All other components within normal limits  URINALYSIS, ROUTINE W REFLEX MICROSCOPIC - Abnormal; Notable for the following components:   Color, Urine YELLOW (*)    APPearance CLEAR (*)    All other components within normal limits  CBG MONITORING, ED   RADIOLOGY  Left shoulder x-ray obtained, interpreted the images as well as reviewed the radiologist report which was negative for fractures.  MRI of the brain does not show any acute abnormalities patient does have age-related cerebral atrophy with mild chronic small vessel ischemic disease, with small remote left cerebral infarct.  PROCEDURES:  Critical Care performed: No  Procedures   MEDICATIONS ORDERED IN ED: Medications  acetaminophen  (TYLENOL ) tablet 650 mg (650 mg Oral Given 07/26/23 1804)     IMPRESSION /  MDM / ASSESSMENT AND PLAN / ED COURSE  I reviewed the triage vital signs and the nursing notes.                             89 year old male presents for evaluation after a fall.  Patient was tachycardic on presentation otherwise vital signs are stable.  Patient NAD on exam.  Differential diagnosis includes, but is not limited to, shoulder fracture, shoulder dislocation, muscle strain, contusion, deconditioning, electrolyte abnormality, UTI.  Patient's presentation is most consistent with acute complicated illness / injury requiring diagnostic workup.  CBC is unremarkable.  BUN is elevated on CMP otherwise unremarkable.  Urinalysis without signs of infection.  Patient is very weak and is unable to stand up at the side of the bed without assistance.  He is very unsteady when attempting to walk. Family reports that he does not usually have trouble walking and that this began this morning, will obtain a brain MRI to further evaluate potential cause.    MRI of the brain does not show any acute abnormalities.    Do not feel patient would be safe for discharge as I feel confident he will have another fall if he goes home today. Patient's daughter and wife are present in the room and are concerned about him falling at home if he were to be discharged today as well.  Patient's blood work and workup is reassuring so do not have reason to admit to medicine, will admit patient as a TOC border for possible rehab facility placement. Patient and family in agreement with plan.      FINAL CLINICAL IMPRESSION(S) / ED DIAGNOSES   Final diagnoses:  Fall, initial encounter     Rx / DC Orders   ED Discharge Orders     None        Note:  This document was prepared using Dragon voice recognition software and may include unintentional dictation errors.   Cleaster Tinnie LABOR, PA-C 07/26/23 2046    Dorothyann Drivers, MD 07/29/23 1506

## 2023-07-26 NOTE — ED Notes (Addendum)
 This tech assisted patient to the bathroom across from 40H. The pt used a walker to ambulate. The patient had a bowel movement. The pt ambulated back to his room with walker and assistance from this tech. Pt tolerated ambulation well. Pt back in bed with call bell in reach and has no other requests at this time.

## 2023-07-27 DIAGNOSIS — M25552 Pain in left hip: Secondary | ICD-10-CM | POA: Diagnosis not present

## 2023-07-27 NOTE — ED Provider Notes (Signed)
-----------------------------------------   7:34 AM on 07/27/2023 -----------------------------------------   Blood pressure 113/65, pulse (!) 108, temperature 98.6 F (37 C), temperature source Oral, resp. rate 20, SpO2 98%.  The patient is calm and cooperative at this time.  There have been no acute events since the last update.  Awaiting disposition plan from [facility] team.  Home medications have been ordered and I ordered PT and OT evaluations   Gordan Huxley, MD 07/27/23 [phone]

## 2023-07-27 NOTE — TOC Transition Note (Addendum)
 Transition of Care [facility]) - Discharge Note   Patient Details  Name: Victor [name] MRN: [id number] Date of Birth: [birth year]  [facility]) CM/SW Contact:  Dalia GORMAN Fuse, RN Phone Number: 07/27/2023, 10:24 AM   Clinical Narrative:    TOC spoke with the patient and his son his room. The patient is from home with his wife but is independent of ADLs. Ocassionally he uses a cane to assist with ambulation. He no longer drives a vehicle, but his wife is able to drive him to appointments and to run errands. His PCP is Dr. Gearld and he uses [facility] on [address].  The plan is for the patient to discharge to home with [facility] PT/OT. Patient offered choice, but did not have a preference. Referral called to [facility]  [phone], accepted the referral. TOC will continue to follow.       Patient Goals and CMS Choice            Discharge Placement                       Discharge Plan and Services Additional resources added to the After Visit Summary for                                       Social Drivers of Health (SDOH) Interventions SDOH Screenings   Food Insecurity: No Food Insecurity (02/07/2023)   Received from [facility]  Housing: Low Risk  (02/15/2023)   Received from [facility]  Transportation Needs: No Transportation Needs (02/07/2023)   Received from [facility]  Utilities: Not At Risk (02/07/2023)   Received from [facility]  Depression [phone]): Low Risk  (12/16/2019)  Financial Resource Strain: Low Risk  (02/07/2023)   Received from [facility]  Tobacco Use: Medium Risk (07/26/2023)     Readmission Risk Interventions     No data to display

## 2023-07-27 NOTE — Evaluation (Signed)
 Physical Therapy Evaluation Patient Details Name: Victor [name] MRN: [id number] DOB: 02-15-1931 Today's Date: 07/27/2023  History of Present Illness  89 y/o male presented to ED on 07/26/23 after fall with legs giving out. PMH: CAD, HTN, diabetes, aortic stenosis s/p TAVR  Clinical Impression  Patient admitted with the above. PTA, patient lives with wife and was modI for mobility with use of quad cane and independent with ADLs. Patient presents with weakness, impaired balance, and decreased activity tolerance. Ambulated in hallway with HHAx1 and CGA-supervision with no overt LOB. Required minA to return to supine due to BLE soreness/weakness. Patient seems close to baseline functionally. Patient will benefit from skilled PT services during acute stay to address listed deficits. Patient will benefit from ongoing therapy at discharge to maximize functional independence and safety.        If plan is discharge home, recommend the following: A little help with bathing/dressing/bathroom;Assistance with cooking/housework;Assist for transportation;Help with stairs or ramp for entrance   Can travel by private vehicle        Equipment Recommendations None recommended by PT  Recommendations for Other Services       Functional Status Assessment Patient has had a recent decline in their functional status and demonstrates the ability to make significant improvements in function in a reasonable and predictable amount of time.     Precautions / Restrictions Precautions Precautions: Fall Recall of Precautions/Restrictions: Intact Restrictions Weight Bearing Restrictions Per Provider Order: No      Mobility  Bed Mobility Overal bed mobility: Needs Assistance Bed Mobility: Sit to Supine       Sit to supine: Min assist   General bed mobility comments: assist for BLE management due to soreness/weakness    Transfers Overall transfer level: Needs assistance Equipment used: Rolling Faythe Heitzenrater (2  wheels) Transfers: Sit to/from Stand Sit to Stand: Supervision                Ambulation/Gait Ambulation/Gait assistance: Supervision, Contact guard assist Gait Distance (Feet): 100 Feet Assistive device: 1 person hand held assist Gait Pattern/deviations: Step-through pattern, Decreased stride length Gait velocity: decreased     General Gait Details: CGA-supervision for safety with ambulation using HHAx1. Utilizes quad cane at home  Stairs            Wheelchair Mobility     Tilt Bed    Modified Rankin (Stroke Patients Only)       Balance Overall balance assessment: Needs assistance, History of Falls Sitting-balance support: No upper extremity supported, Feet supported Sitting balance-Leahy Scale: Good     Standing balance support: Single extremity supported, During functional activity Standing balance-Leahy Scale: Fair                               Pertinent Vitals/Pain Pain Assessment Pain Assessment: 0-10 Pain Score: 7  Pain Location: BLEs Pain Descriptors / Indicators: Sore Pain Intervention(s): Monitored during session, Limited activity within patient's tolerance    Home Living Family/patient expects to be discharged to:: Private residence Living Arrangements: Spouse/significant other Available Help at Discharge: Family;Available 24 hours/day Type of Home: House Home Access: Stairs to enter Entrance Stairs-Rails: Lawyer of Steps: 4   Home Layout: One level Home Equipment: Tub bench;Cane - Programmer, applications (2 wheels);Wheelchair - manual Additional Comments: daughter and a son nearby    Prior Function Prior Level of Function : Independent/Modified Independent;History of Falls (last six months)  Mobility Comments: MOD I with QC use community and household distances, does not drive, 1 recent fall ADLs Comments: IND with ADLs and assists with household chores     Extremity/Trunk  Assessment   Upper Extremity Assessment Upper Extremity Assessment: Defer to OT evaluation    Lower Extremity Assessment Lower Extremity Assessment: Generalized weakness    Cervical / Trunk Assessment Cervical / Trunk Assessment: Kyphotic  Communication   Communication Communication: Impaired Factors Affecting Communication: Hearing impaired    Cognition Arousal: Alert Behavior During Therapy: WFL for tasks assessed/performed   PT - Cognitive impairments: No apparent impairments                         Following commands: Intact       Cueing Cueing Techniques: Verbal cues     General Comments      Exercises     Assessment/Plan    PT Assessment Patient needs continued PT services  PT Problem List Decreased strength;Decreased activity tolerance;Decreased balance;Decreased mobility;Decreased knowledge of use of DME;Pain       PT Treatment Interventions DME instruction;Gait training;Functional mobility training;Therapeutic activities;Therapeutic exercise;Balance training;Patient/family education    PT Goals (Current goals can be found in the Care Plan section)  Acute Rehab PT Goals Patient Stated Goal: to go home PT Goal Formulation: With patient Time For Goal Achievement: 08/10/23 Potential to Achieve Goals: Good    Frequency Min 1X/week     Co-evaluation               AM-PAC PT 6 Clicks Mobility  Outcome Measure Help needed turning from your back to your side while in a flat bed without using bedrails?: A Little Help needed moving from lying on your back to sitting on the side of a flat bed without using bedrails?: A Little Help needed moving to and from a bed to a chair (including a wheelchair)?: A Little Help needed standing up from a chair using your arms (e.g., wheelchair or bedside chair)?: A Little Help needed to walk in hospital room?: A Little Help needed climbing 3-5 steps with a railing? : A Little 6 Click Score: 18    End  of Session Equipment Utilized During Treatment: Gait belt Activity Tolerance: Patient tolerated treatment well Patient left: in bed;with call bell/phone within reach Nurse Communication: Mobility status PT Visit Diagnosis: Unsteadiness on feet (R26.81);Muscle weakness (generalized) (M62.81)    Time: 9093-9081 PT Time Calculation (min) (ACUTE ONLY): 12 min   Charges:   PT Evaluation $PT Eval Moderate Complexity: 1 Mod   PT General Charges $$ ACUTE PT VISIT: 1 Visit         Maryanne Finder, PT, DPT Physical Therapist - [facility]  [facility]   [place] A Bryanne Riquelme 07/27/2023, 9:26 AM

## 2023-07-27 NOTE — Evaluation (Signed)
 Occupational Therapy Evaluation Patient Details Name: Victor [name] MRN: [id number] DOB: 23-Nov-1931 Today's Date: 07/27/2023   History of Present Illness   89 y/o male presented to ED on 07/26/23 after fall with legs giving out. PMH: CAD, HTN, diabetes, aortic stenosis s/p TAVR     Clinical Impressions Pt was seen for OT evaluation this date. PTA, pt resides in a one level home with his wife with 4 STE and bil HR. Reports he is ambulatory household and community distances with a QC. Reports 1 recent fall. He is MOD I with ADL performance and assists with household chores and taking care of his dog at home.  Pt presents to acute OT demonstrating impaired ADL performance and functional mobility 2/2 weakness, pain and mild balance deficits. Reports 7/10 soreness to BLEs. Pt currently requires Min A for bed mobility d/t soreness and weakness in BLEs. He demo STS from high ED gurney with CGA/SBA, then ambulated ~50 ft to the bathroom using RW with CGA/SBA and no LOB. Cues for safety with RW d/t never using one. SBA to SUP for toilet transfer, clothing management and hygiene. Able to stand at sink to perform hand hygiene with SBA and unilateral support on sink. Handoff to PT at end of session.  Pt would benefit from skilled OT services to address noted impairments and functional limitations to maximize safety and independence while minimizing falls risk and caregiver burden. Do anticipate the need for [facility] services on DC.      If plan is discharge home, recommend the following:   A little help with walking and/or transfers;A little help with bathing/dressing/bathroom;Help with stairs or ramp for entrance;Assistance with cooking/housework     Functional Status Assessment   Patient has had a recent decline in their functional status and demonstrates the ability to make significant improvements in function in a reasonable and predictable amount of time.     Equipment Recommendations   None  recommended by OT     Recommendations for Other Services         Precautions/Restrictions   Precautions Precautions: Fall Recall of Precautions/Restrictions: Intact Restrictions Weight Bearing Restrictions Per Provider Order: No     Mobility Bed Mobility Overal bed mobility: Needs Assistance Bed Mobility: Supine to Sit     Supine to sit: Min assist     General bed mobility comments: assist for BLE management due to soreness/weakness from high ED gurney    Transfers Overall transfer level: Needs assistance Equipment used: Rolling walker (2 wheels) Transfers: Sit to/from Stand Sit to Stand: Supervision, Contact guard assist           General transfer comment: CGA to SBA for STS from EOB to RW and ambulation to the bathroom ~50 ft with no LOB, cues for RW use/poximity and not lifting it up      Balance Overall balance assessment: Needs assistance, History of Falls Sitting-balance support: No upper extremity supported, Feet supported Sitting balance-Leahy Scale: Good     Standing balance support: Single extremity supported, During functional activity Standing balance-Leahy Scale: Fair Standing balance comment: able to stand at sink to wash hands without UE support at times                           ADL either performed or assessed with clinical judgement   ADL Overall ADL's : Needs assistance/impaired     Grooming: Wash/dry hands;Supervision/safety;Contact guard assist;Standing Grooming Details (indicate cue type and reason): at  sink                 Toilet Transfer: Contact guard assist;Supervision/safety;Rolling walker (2 wheels);Regular Toilet;Grab bars   Toileting- Clothing Manipulation and Hygiene: Supervision/safety;Sitting/lateral lean Toileting - Clothing Manipulation Details (indicate cue type and reason): after cont BM on toliet     Functional mobility during ADLs: Contact guard assist;Rolling walker (2 wheels)        Vision         Perception         Praxis         Pertinent Vitals/Pain Pain Assessment Pain Assessment: 0-10 Pain Score: 7  Pain Location: BLEs Pain Descriptors / Indicators: Sore Pain Intervention(s): Monitored during session, Repositioned     Extremity/Trunk Assessment Upper Extremity Assessment Upper Extremity Assessment: Overall WFL for tasks assessed   Lower Extremity Assessment Lower Extremity Assessment: Generalized weakness   Cervical / Trunk Assessment Cervical / Trunk Assessment: Kyphotic   Communication Communication Communication: Impaired Factors Affecting Communication: Hearing impaired   Cognition Arousal: Alert Behavior During Therapy: WFL for tasks assessed/performed Cognition: No apparent impairments             OT - Cognition Comments: A&Ox4-increased time to state year                 Following commands: Intact       Cueing  General Comments   Cueing Techniques: Verbal cues      Exercises Other Exercises Other Exercises: Edu on role of OT in acute setting   Shoulder Instructions      Home Living Family/patient expects to be discharged to:: Private residence Living Arrangements: Spouse/significant other Available Help at Discharge: Family;Available 24 hours/day Type of Home: House Home Access: Stairs to enter [employer] of Steps: 4 Entrance Stairs-Rails: Left;Right Home Layout: One level     Bathroom Shower/Tub: Chief Strategy Officer: Standard     Home Equipment: Tub bench;Cane - Programmer, applications (2 wheels);Wheelchair - manual   Additional Comments: daughter and a son nearby      Prior Functioning/Environment Prior Level of Function : Independent/Modified Independent;History of Falls (last six months)             Mobility Comments: MOD I with QC use community and household distances, does not drive, 1 recent fall ADLs Comments: IND with ADLs and assists with household  chores    OT Problem List: Decreased strength;Impaired balance (sitting and/or standing)   OT Treatment/Interventions: Self-care/ADL training;Therapeutic exercise;Therapeutic activities;Energy conservation;DME and/or AE instruction;Patient/family education;Balance training      OT Goals(Current goals can be found in the care plan section)   Acute Rehab OT Goals Patient Stated Goal: return home OT Goal Formulation: With patient Time For Goal Achievement: 08/10/23 Potential to Achieve Goals: Good ADL Goals Pt Will Perform Lower Body Bathing: Independently;sitting/lateral leans;sit to/from stand Pt Will Perform Lower Body Dressing: Independently;sit to/from stand;sitting/lateral leans Pt Will Transfer to Toilet: with modified independence;ambulating;regular height toilet   OT Frequency:  Min 2X/week    Co-evaluation              AM-PAC OT 6 Clicks Daily Activity     Outcome Measure Help from another person eating meals?: None Help from another person taking care of personal grooming?: None Help from another person toileting, which includes using toliet, bedpan, or urinal?: A Little Help from another person bathing (including washing, rinsing, drying)?: A Little Help from another person to put on and taking off regular upper  body clothing?: None Help from another person to put on and taking off regular lower body clothing?: A Little 6 Click Score: 21   End of Session Equipment Utilized During Treatment: Gait belt;Rolling walker (2 wheels) Nurse Communication: Mobility status  Activity Tolerance: Patient tolerated treatment well Patient left: in bed;with call bell/phone within reach (handoff to PT)  OT Visit Diagnosis: Other abnormalities of gait and mobility (R26.89);Muscle weakness (generalized) (M62.81)                Time: 9151-9094 OT Time Calculation (min): 17 min Charges:  OT General Charges $OT Visit: 1 Visit OT Evaluation $OT Eval Moderate Complexity: 1  Mod Katelind Pytel, OTR/L  07/27/23, 11:46 AM  Duwaine FORBES Saupe 07/27/2023, 11:43 AM

## 2023-07-27 NOTE — ED Provider Notes (Signed)
 Patient discharged with home health.   Floy Roberts, MD 07/27/23 1600

## 2023-07-27 NOTE — ED Notes (Signed)
 Pt D/C with family at this time.

## 2023-07-29 NOTE — Progress Notes (Signed)
  Subjective:  Patient ID: Victor [name], male    DOB: [birth year],  MRN: [id number]  Victor [name] presents to clinic today for at risk foot care. Pt has h/o NIDDM with PAD and painful thick toenails that are difficult to trim. Pain interferes with ambulation. Aggravating factors include wearing enclosed shoe gear. Pain is relieved with periodic professional debridement.  Chief Complaint  Patient presents with   Nail Problem    Thick painful toenails, 3 month follow up   New problem(s): None.   PCP is Feldpausch, Cheryl BRAVO, MD.  Allergies  Allergen Reactions   Azithromycin Rash and Anaphylaxis   Cephalosporins Anaphylaxis and Other (See Comments)   Codeine Anaphylaxis, Itching and Rash   Doxycycline Anaphylaxis   Phenylephrine -Guaifenesin     Unknown reaction    Pseudoephedrine Other (See Comments)    unknown    Amoxicillin Rash   Levofloxacin  Rash   Penicillins Rash    Other reaction(s): Unknown   Sulfa Antibiotics Rash   Sulfasalazine Rash    Review of Systems: Negative except as noted in the HPI.  Objective: No changes noted in today's physical examination. There were no vitals filed for this visit. Victor [name] is a pleasant 89 y.o. male WD, WN in NAD. AAO x 3.  Vascular Examination: CFT <3 seconds b/l. DP/PT pulses faintly palpable b/l. Skin temperature gradient warm to warm b/l. No pain with calf compression. No ischemia or gangrene. No cyanosis or clubbing noted b/l. Pedal hair absent.   Neurological Examination: Sensation grossly intact b/l with 10 gram monofilament. Vibratory sensation intact b/l.   Dermatological Examination: Pedal skin warm and supple b/l.   No open wounds. No interdigital macerations.  Toenails 1-5 b/l thick, discolored, elongated with subungual debris and pain on dorsal palpation.    No corns, calluses nor porokeratotic lesions noted.  Musculoskeletal Examination: Normal muscle strength 5/5 to all lower extremity muscle groups  bilaterally. HAV with bunion deformity noted b/l LE.SABRA No pain, crepitus or joint limitation noted with ROM b/l LE.  Patient ambulates independently without assistive aids.  Radiographs: None  Assessment/Plan: 1. Pain due to onychomycosis of toenail   2. Type 2 diabetes mellitus with vascular disease [facility])   Consent given for treatment. Patient examined. All patient's and/or POA's questions/concerns addressed on today's visit. Toenails 1-5 debrided in length and girth without incident. Continue foot and shoe inspections daily. Monitor blood glucose per PCP/Endocrinologist's recommendations. Continue soft, supportive shoe gear daily. Report any pedal injuries to medical professional. Call office if there are any questions/concerns. -Patient/POA to call should there be question/concern in the interim.   Return in about 9 weeks (around 09/27/2023).  Victor [name], DPM      [place] LOCATION: [address], [place] [address]                   Office [phone]   [facility] LOCATION: [address] [place], [place] [address] Office [phone]

## 2023-08-13 ENCOUNTER — Telehealth: Payer: Self-pay | Admitting: Urology

## 2023-08-13 NOTE — Telephone Encounter (Signed)
 Patient's wife Deane called and stated patient has misplaced his medication for Gemtesa . Wife would like to know if another script could be sent to pharmacy at [facility] on [address]. Please advise.

## 2023-08-14 NOTE — Telephone Encounter (Signed)
 Advised patient

## 2023-08-20 NOTE — Telephone Encounter (Signed)
 Pt wife called back and said that they pharmacy needs a pre auth for Gemtesa  to be sent in. I also advised pt to call his insrnace company to find out what other meds that they will covered so that way if they will not auth Gemtesa  them we already have a back up. I call pt back when auth is taken care of so they can pick up prescription.

## 2023-09-04 ENCOUNTER — Ambulatory Visit: Admitting: Podiatry

## 2023-09-13 ENCOUNTER — Ambulatory Visit (INDEPENDENT_AMBULATORY_CARE_PROVIDER_SITE_OTHER): Admitting: Podiatry

## 2023-09-13 DIAGNOSIS — G629 Polyneuropathy, unspecified: Secondary | ICD-10-CM | POA: Diagnosis not present

## 2023-09-13 DIAGNOSIS — E1159 Type 2 diabetes mellitus with other circulatory complications: Secondary | ICD-10-CM

## 2023-09-13 NOTE — Progress Notes (Unsigned)
 Subjective:  Patient ID: Victor [name], male    DOB: 1931-09-04,  MRN: [id number]  Chief Complaint  Patient presents with   Toe Pain    Right foot toe pain     89 y.o. male presents with the above complaint.  Patient presents with complaint of right foot severe neuropathy pain.  Patient is a diabetic.  Currently on gabapentin he wanted to get it evaluated has not seen anyone else prior to seeing me.  At this he states has been bothering him wakes him up at night.  Pain is 5 out of 10 dull aching nature.   Review of Systems: Negative except as noted in the HPI. Denies N/V/F/Ch.  Past Medical History:  Diagnosis Date   Angina    Arthritis    BPH (benign prostatic hypertrophy)    CAD S/P percutaneous coronary angioplasty 07/2006   PCI to RCA - Promus DES 2.5 mm x 23 mm; 2D ECHO - EF >55% --> Cath 05/2019 - ~50-60% pRCA with patent stent.  70% ost D1 (small).     Diabetes mellitus    Diverticulitis    s/p colectomy   GERD (gastroesophageal reflux disease)    H/O Severe aortic stenosis 05/2019   Pre-TAVR mean AVA 52.5 mmHg.  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   Severe mitral valve regurgitation with mitral prolapse and flail P3 segment    Squamous cell skin cancer, nasal tip     Current Outpatient Medications:    acetaminophen (TYLENOL) 500 MG tablet, Take 500 mg by mouth every 6 (six) hours as needed for moderate pain or headache., Disp: , Rfl:    ascorbic acid (VITAMIN C) 500 MG tablet, Take 1,000-1,500 mg by mouth See admin instructions. Take 1000 mg in the morning and 1500 mg at night, Disp: , Rfl:    aspirin EC 81 MG tablet,  Take 81 mg by mouth daily., Disp: , Rfl:    benzonatate (TESSALON) 200 MG capsule, Take 200 mg by mouth 3 (three) times daily as needed for cough., Disp: , Rfl:    bismuth subsalicylate (PEPTO BISMOL) 262 MG/15ML suspension, Take 30 mLs by mouth every 6 (six) hours as needed for indigestion or diarrhea or loose stools., Disp: , Rfl:    cetirizine (ZYRTEC) 10 MG tablet, Take 10 mg by mouth as needed., Disp: , Rfl:    Cholecalciferol (VITAMIN D3) 2000 UNITS TABS, Take 4,000 Units by mouth 2 (two) times daily. , Disp: , Rfl:    CINNAMON PO, Take 1,000 mg by mouth 2 (two) times daily., Disp: , Rfl:    clindamycin (CLEOCIN) 300 MG capsule, Take 2 capsules (600 mg) 1 hour prior to all dental visits., Disp: 8 capsule, Rfl: 2   Coenzyme Q10 (COQ-10) 100 MG CAPS, Take 100 mg by mouth daily., Disp: , Rfl:    Cyanocobalamin (B-12) 2500 MCG TABS, Take 2,500 mcg by mouth daily., Disp: , Rfl:    divalproex (DEPAKOTE) 125 MG DR tablet, Take 125 mg by mouth 2 (two) times daily., Disp: , Rfl:  donepezil (ARICEPT) 10 MG tablet, Take 10 mg by mouth daily., Disp: , Rfl:    EPIPEN 2-PAK 0.3 MG/0.3ML SOAJ injection, Inject 0.3 mLs as directed as needed for anaphylaxis. , Disp: , Rfl:    ezetimibe (ZETIA) 10 MG tablet, Take 10 mg by mouth daily., Disp: , Rfl:    fluticasone (FLONASE) 50 MCG/ACT nasal spray, Place 2 sprays into both nostrils 2 (two) times daily as needed for allergies. , Disp: , Rfl:    gabapentin (NEURONTIN) 100 MG capsule, Take 300 mg by mouth at bedtime. , Disp: , Rfl:    glipiZIDE (GLUCOTROL XL) 5 MG 24 hr tablet, Take 5 mg by mouth daily with breakfast., Disp: , Rfl:    Glucosamine HCl 1000 MG TABS, Take 1,000 mg by mouth 2 (two) times daily., Disp: , Rfl:    ipratropium (ATROVENT) 0.03 % nasal spray, Place 2 sprays into both nostrils 2 (two) times daily as needed for rhinitis., Disp: , Rfl:    loperamide (IMODIUM A-D) 2 MG tablet, Take 2 mg by mouth 2 (two) times daily., Disp: , Rfl:    meloxicam  (MOBIC) 15 MG tablet, Take 15 mg by mouth daily as needed for pain., Disp: , Rfl:    memantine (NAMENDA) 5 MG tablet, Take 5 mg by mouth daily., Disp: , Rfl:    MIEBO 1.338 GM/ML SOLN, Apply 1 drop to eye 4 (four) times daily., Disp: , Rfl:    mirtazapine (REMERON) 15 MG tablet, Take 15 mg by mouth at bedtime., Disp: , Rfl:    Multiple Vitamin (MULITIVITAMIN WITH MINERALS) TABS, Take 1 tablet by mouth 2 (two) times daily. , Disp: , Rfl:    nitroGLYCERIN (NITROSTAT) 0.4 MG SL tablet, Place 0.4 mg under the tongue every 5 (five) minutes as needed., Disp: , Rfl:    Omega 3 1200 MG CAPS, Take 2,400 mg by mouth 2 (two) times daily. , Disp: , Rfl:    Polyethyl Glycol-Propyl Glycol (SYSTANE) 0.4-0.3 % SOLN, Place 1 drop into both eyes daily., Disp: , Rfl:    Probiotic CAPS, Take 1 capsule by mouth daily., Disp: , Rfl:    rosuvastatin (CRESTOR) 20 MG tablet, TAKE 2 TABLETS BY MOUTH ON MONDAYS, WEDNESDAYS AND FRIDAYS AND 1 TABLET SATURDAY, SUNDAY, TUESDAY AND THURSDAY, Disp: 120 tablet, Rfl: 3   Saw Palmetto 450 MG CAPS, Take 900 mg by mouth daily. , Disp: , Rfl:    trospium (SANCTURA) 20 MG tablet, Take 1 tablet (20 mg total) by mouth 2 (two) times daily., Disp: 60 tablet, Rfl: 11   Vibegron (GEMTESA) 75 MG TABS, Take 1 tablet (75 mg total) by mouth daily., Disp: 90 tablet, Rfl: 3   zinc gluconate 50 MG tablet, Take 50 mg by mouth daily., Disp: , Rfl:   Social History   Tobacco Use  Smoking Status Never  Smokeless Tobacco Former   Types: Chew   Quit date: 01/31/1996    Allergies  Allergen Reactions   Azithromycin Rash and Anaphylaxis   Cephalosporins Anaphylaxis and Other (See Comments)   Codeine Anaphylaxis, Itching and Rash   Doxycycline Anaphylaxis   Phenylephrine-Guaifenesin     Unknown reaction    Pseudoephedrine Other (See Comments)    unknown    Amoxicillin Rash   Levofloxacin Rash   Penicillins Rash    Other reaction(s): Unknown   Sulfa Antibiotics Rash   Sulfasalazine Rash    Objective:  There were no vitals filed for this visit. There is no height or weight on  file to calculate BMI. Constitutional Well developed. Well nourished.  Vascular Dorsalis pedis pulses palpable bilaterally. Posterior tibial pulses palpable bilaterally. Capillary refill normal to all digits.  No cyanosis or clubbing noted. Pedal hair growth normal.  Neurologic Normal speech. Oriented to person, place, and time. Epicritic sensation to light touch grossly present bilaterally.  Decreased protective vibratory sensation noted to the right foot subjective component of nerve pain noted  Dermatologic Nails well groomed and normal in appearance. No open wounds. No skin lesions.  Orthopedic: Manual muscle strength 5 out of 5 no weakness noted mild hammertoe contractures noted.   Radiographs: None Assessment:   1. Neuropathy   2. Type 2 diabetes mellitus with vascular disease (HCC)    Plan:  Patient was evaluated and treated and all questions answered.  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 Marcelino for advanced management of neuropathy he states understanding and would like to proceed with that - Patient has failed gabapentin therapy  No follow-ups on file.

## 2023-09-27 ENCOUNTER — Encounter: Payer: Self-pay | Admitting: Podiatry

## 2023-09-27 ENCOUNTER — Ambulatory Visit (INDEPENDENT_AMBULATORY_CARE_PROVIDER_SITE_OTHER): Admitting: Podiatry

## 2023-09-27 DIAGNOSIS — R21 Rash and other nonspecific skin eruption: Secondary | ICD-10-CM | POA: Diagnosis not present

## 2023-09-27 DIAGNOSIS — G629 Polyneuropathy, unspecified: Secondary | ICD-10-CM | POA: Diagnosis not present

## 2023-09-27 DIAGNOSIS — E1159 Type 2 diabetes mellitus with other circulatory complications: Secondary | ICD-10-CM | POA: Diagnosis not present

## 2023-09-27 DIAGNOSIS — B351 Tinea unguium: Secondary | ICD-10-CM | POA: Diagnosis not present

## 2023-09-27 DIAGNOSIS — M79676 Pain in unspecified toe(s): Secondary | ICD-10-CM | POA: Diagnosis not present

## 2023-09-29 NOTE — Progress Notes (Signed)
  Subjective:  Patient ID: Victor [name], male    DOB: 25-Jun-1931,  MRN: [id number]  Victor [name] presents to clinic today for at risk foot care. Pt has h/o NIDDM with PAD and painful mycotic toenails x 10 which interfere with daily activities. Pain is relieved with periodic professional debridement.   PCP is Feldpausch, Cheryl BRAVO, MD.  Allergies  Allergen Reactions   Azithromycin Rash and Anaphylaxis   Cephalosporins Anaphylaxis and Other (See Comments)   Codeine Anaphylaxis, Itching and Rash   Doxycycline Anaphylaxis   Phenylephrine -Guaifenesin     Unknown reaction    Pseudoephedrine Other (See Comments)    unknown    Amoxicillin Rash   Levofloxacin  Rash   Penicillins Rash    Other reaction(s): Unknown   Sulfa Antibiotics Rash   Sulfasalazine Rash    Review of Systems: Negative except as noted in the HPI.  Objective:  There were no vitals filed for this visit. Victor [name] is a pleasant 89 y.o. male WD, WN in NAD. AAO x 3.  Vascular Examination: CFT <3 seconds b/l. DP/PT pulses faintly palpable b/l. Skin temperature gradient warm to warm b/l. No pain with calf compression. No ischemia or gangrene. No cyanosis or clubbing noted b/l. Pedal hair absent.   Neurological Examination: Sensation grossly intact b/l with 10 gram monofilament. Vibratory sensation intact b/l.   Dermatological Examination: Skin eruption noted lateral aspect right hallux IPJ.       Pedal skin warm and supple b/l.   No open wounds. No interdigital macerations.  Toenails 1-5 b/l thick, discolored, elongated with subungual debris and pain on dorsal palpation.    Musculoskeletal Examination: Normal muscle strength 5/5 to all lower extremity muscle groups bilaterally. HAV with bunion deformity noted b/l LE.SABRA No pain, crepitus or joint limitation noted with ROM b/l LE.  Patient ambulates with walker assistance.  Radiographs: None  Assessment/Plan: 1. Pain due to onychomycosis of toenail   2.  Skin eruption   3. Neuropathy   4. Type 2 diabetes mellitus with vascular disease (HCC)   -Patient was evaluated today. All questions/concerns addressed on today's visit. -Patient's wife instructed to apply Neosporin Cream to right great toe once daily..Call office if condition does not resolve. -Patient to continue soft, supportive shoe gear daily. -Toenails 1-5 b/l were debrided in length and girth with sterile nail nippers and dremel without iatrogenic bleeding.  -Dispensed tube foam. Apply to right great toe every morning. Remove every evening.  -Patient/POA to call should there be question/concern in the interim.   Return in about 3 months (around 12/28/2023).  Victor [name], DPM      [place] LOCATION: [address] [place] [address]                   Office [phone]   [facility] LOCATION: [address] [place], [place] [address] Office [phone]

## 2023-12-11 ENCOUNTER — Emergency Department
Admission: EM | Admit: 2023-12-11 | Discharge: 2023-12-11 | Disposition: A | Discharge status: Home / Self Care | Attending: Emergency Medicine | Admitting: Emergency Medicine

## 2023-12-11 ENCOUNTER — Emergency Department

## 2023-12-11 ENCOUNTER — Other Ambulatory Visit: Payer: Self-pay

## 2023-12-11 DIAGNOSIS — M1990 Unspecified osteoarthritis, unspecified site: Secondary | ICD-10-CM | POA: Insufficient documentation

## 2023-12-11 DIAGNOSIS — S0990XA Unspecified injury of head, initial encounter: Secondary | ICD-10-CM | POA: Diagnosis present

## 2023-12-11 DIAGNOSIS — M25552 Pain in left hip: Secondary | ICD-10-CM | POA: Diagnosis present

## 2023-12-11 DIAGNOSIS — I10 Essential (primary) hypertension: Secondary | ICD-10-CM | POA: Insufficient documentation

## 2023-12-11 DIAGNOSIS — M8589 Other specified disorders of bone density and structure, multiple sites: Secondary | ICD-10-CM | POA: Diagnosis not present

## 2023-12-11 DIAGNOSIS — W19XXXA Unspecified fall, initial encounter: Secondary | ICD-10-CM

## 2023-12-11 DIAGNOSIS — Y92019 Unspecified place in single-family (private) house as the place of occurrence of the external cause: Secondary | ICD-10-CM | POA: Insufficient documentation

## 2023-12-11 DIAGNOSIS — E785 Hyperlipidemia, unspecified: Secondary | ICD-10-CM | POA: Diagnosis not present

## 2023-12-11 DIAGNOSIS — E119 Type 2 diabetes mellitus without complications: Secondary | ICD-10-CM | POA: Diagnosis not present

## 2023-12-11 DIAGNOSIS — M545 Low back pain, unspecified: Secondary | ICD-10-CM | POA: Insufficient documentation

## 2023-12-11 DIAGNOSIS — W010XXA Fall on same level from slipping, tripping and stumbling without subsequent striking against object, initial encounter: Secondary | ICD-10-CM | POA: Insufficient documentation

## 2023-12-11 LAB — CBG MONITORING, ED
Glucose-Capillary: 95 mg/dL (ref 70–99)
Glucose-Capillary: 88 mg/dL (ref 70–99)

## 2023-12-11 NOTE — Discharge Instructions (Addendum)
 Your workup in the emergency department did not show any significant finding or fracture.  Please take Tylenol  1000 mg every 6 hours as needed for discomfort.  Please follow-up with your doctor in the next 1 to 2 days for recheck/reevaluation.

## 2023-12-11 NOTE — ED Triage Notes (Signed)
 Pt here for mechanical fall. C/o of pain on left hip to lower leg. Wife at bedside

## 2023-12-11 NOTE — ED Provider Notes (Signed)
 [facility] Provider Note    Event Date/Time   First MD Initiated Contact with Patient 12/11/23 1923     (approximate)  History   Chief Complaint: Fall  HPI  Victor [name] is a 89 y.o. male with a past medical history of diabetes, arthritis, hypertension, hyperlipidemia, presents to the emergency department after mechanical fall.  According to the patient and family member patient tripped and fell over a Christmas tree.  Patient is complaining of pain mostly in the left hip radiating somewhat around to the lower back.  He is not sure if he hit his head.  Did not pass out.  No anticoagulation.  Physical Exam   Triage Vital Signs: ED Triage Vitals  Encounter Vitals Group     BP 12/11/23 1605 129/67     Girls Systolic BP Percentile --      Girls Diastolic BP Percentile --      Boys Systolic BP Percentile --      Boys Diastolic BP Percentile --      Pulse Rate 12/11/23 1605 80     Resp 12/11/23 1605 18     Temp 12/11/23 1605 97.9 F (36.6 C)     Temp Source 12/11/23 1605 Oral     SpO2 12/11/23 1605 95 %     Weight 12/11/23 1606 155 lb (70.3 kg)     Height 12/11/23 1606 5' 9 (1.753 m)     Head Circumference --      Peak Flow --      Pain Score 12/11/23 1613 5     Pain Loc --      Pain Education --      Exclude from Growth Chart --     Most recent vital signs: Vitals:   12/11/23 1605  BP: 129/67  Pulse: 80  Resp: 18  Temp: 97.9 F (36.6 C)  SpO2: 95%    General: Awake, no distress.  CV:  Good peripheral perfusion.  Regular rate and rhythm  Resp:  Normal effort.  Equal breath sounds bilaterally.  Abd:  No distention.  Soft, nontender.  No rebound or guarding. Other:  Patient has mild tenderness palpation of the left hip with mild pain with range of motion although overall good range of motion.  Neurovascularly intact distally.  Good range of motion all other extremities without pain elicited.  Patient states some discomfort to the lower  back.   ED Results / Procedures / Treatments    RADIOLOGY  I have reviewed and interpreted the hip x-ray images no obvious fracture seen on my evaluation. Radiology has read the x-ray as no acute displaced fracture.   MEDICATIONS ORDERED IN ED: Medications - No data to display   IMPRESSION / MDM / ASSESSMENT AND PLAN / ED COURSE  I reviewed the triage vital signs and the nursing notes.  Patient's presentation is most consistent with acute presentation with potential threat to life or bodily function.  Patient presents emergency department after mechanical fall at home after tripping over a Christmas tree.  Patient is complaining of some mild pain in the left hip as well as lower back.  Overall the patient appears well does not believe he hit his head although he is not sure.  No LOC.  No anticoagulation.  Patient's hip x-ray read as negative however patient is having some pain with palpation as well as range of motion we will obtain a CT scan to further evaluate.  Given lower back pain  we will also obtain a CT lumbar scan, given the patient's age fall and possible shear force we will obtain CT of the head and C-spine as precaution.  Patient and family member agreeable to plan.  Patient CT imaging is negative including head, cervical spine, lumbar spine and left hip.  We will discharge the patient home with Tylenol  to be used as needed.  Patient and family member agreeable to plan.  FINAL CLINICAL IMPRESSION(S) / ED DIAGNOSES   Fall  Note:  This document was prepared using Dragon voice recognition software and may include unintentional dictation errors.   Dorothyann Drivers, MD 12/11/23 2032

## 2023-12-11 NOTE — ED Triage Notes (Signed)
 First Nurse Note: Patient to ED via [facility] from home for a fall. Pt tripping and falling over the Christmas tree. C/o lower back and left rib pain. Denies head injury, blood thinner or LOC.

## 2024-01-01 NOTE — Progress Notes (Addendum)
 HPI:  Victor [name] is a 89 y.o. male who presents for follow up type 2 diabetes.  He was diagnosed in the early 2000's.  He had his aortic valve replaced in 6/21.  I last saw him in 9/25.  At that time, I cut his Glipizide . His wife says he went back on 5 mg.  In 11/25, he went to the ED due to a fall. He is accompanied by his wife today who gave most of his history.  They are in the process of moving to [facility].  He is currently only on glipizide  XL 5 mg once a day.  He previously could not tolerate metformin  due to GI issues.    He tests his sugar 1-2 per day. His meter was not able to be downloaded today.  I did review the meter memory and his 14 day average is 138.  He tries to avoid concentrated carbohydrates.  He tries to exercise 3 times a week but is limited.  He currently reports no lower extremity pain/numbness/burning.  He is concerned about his diabetes.   ROS:  No chest pain.  No shortness of breath  Medical History: Past Medical History:  Diagnosis Date   BPH (benign prostatic hyperplasia)    Chicken pox    Coronary artery disease    Degenerative disk disease    Diabetes mellitus type 2, uncomplicated (CMS/HHS-HCC)    Diverticula of colon    GERD (gastroesophageal reflux disease)    Heart murmur    Hx of adenomatous colonic polyps    Hyperlipidemia    Knee osteoarthritis    Memory loss 07/14/2013   Seasonal allergies     Surgical History: Past Surgical History:  Procedure Laterality Date   COLONOSCOPY  03/19/1987   Hyperplastic Polyp   COLONOSCOPY  07/20/2000   05/22/1994; FH Colon Polyps (Brother/Sister)   EGD  11/09/2000   07/20/2000   COLONOSCOPY  05/27/2007   Adenomatous Polyp, FH Colon Polyps (Brother/Sister)   COLONOSCOPY  09/05/2012   PH Adenomatous Polyp, FH Colon Polyps (Brother/Sister); No repeat recommended due to age per RTE (dw)   cataract surgery Left 05/25/2014   COLECTOMY PARTIAL ABDOMINAL & TRANSANAL     eye surgery or  remote past     REPLACEMENT AORTIC VALVE VIA HEART PORT     squamous cancer cell removal     of the skin   transurethral resection of prostate      Social History:  reports that he has never smoked. He has never used smokeless tobacco. He reports current alcohol  use. He reports that he does not use drugs.  He is retired.  He was a technical brewer.  Family History: family history includes ALS in his mother; Colon polyps in his brother and sister; Prostate cancer in his father; Stroke in his sister.  Medications: Current Outpatient Medications  Medication Sig Dispense Refill   ascorbic acid, vitamin C , (VITAMIN C ) 1000 MG tablet Take 1,000 mg by mouth once daily     aspirin  81 MG EC tablet Take 81 mg by mouth once daily.     benzonatate  (TESSALON ) 200 MG capsule Take 1 capsule by mouth three times daily as needed for cough 15 capsule 0   blood glucose diagnostic (ONETOUCH ULTRA TEST) test strip USE 1 STRIP TO CHECK GLUCOSE ONCE DAILY 100 each 4   blood-glucose meter (ACCU-CHEK GUIDE GLUCOSE METER) Misc 1 each as directed 1 each 0   cetirizine  (ZYRTEC ) 10 MG tablet Take 10 mg  by mouth once daily.      cholecalciferol  (VITAMIN D3) 2,000 unit tablet Take 2,000 Units by mouth 2 (two) times daily     CINNAMON BARK (CINNAMON ORAL) Take 1 capsule by mouth once daily     co-enzyme Q-10, ubiquinone, 60 mg capsule Take 10 mg by mouth daily with breakfast       cyanocobalamin  (VITAMIN B12) 1000 MCG tablet Take 1,000 mcg by mouth once daily.     DOCOSAHEXANOIC ACID/EPA (FISH OIL ORAL) Take 1 capsule by mouth 2 (two) times daily     donepeziL  (ARICEPT ) 10 MG tablet Take 1 tablet (10 mg total) by mouth at bedtime 90 tablet 0   EPINEPHrine  (EPIPEN ) 0.3 mg/0.3 mL auto-injector INJECT 0.3ML INTO MUSCLE ONCE AS NEEDED FOR ANAPHYLAXIS 2 each 0   ezetimibe  (ZETIA ) 10 mg tablet Take 1 tablet by mouth once daily 90 tablet 3   fluticasone  (FLONASE ) 50 mcg/actuation nasal spray Place 2 sprays  into both nostrils once daily.     gabapentin  (NEURONTIN ) 100 MG capsule Take 100 mg twice a day, and then take 3 caps at night. 150 capsule 1   GLUCOSAMINE HCL/CHONDR SU A NA (OSTEO BI-FLEX ORAL) Take 1 tablet by mouth once daily     ipratropium (ATROVENT ) 0.03 % nasal spray 2 sprays by Nasal route once daily as needed       memantine  (NAMENDA ) 5 MG tablet Take 1 tablet (5 mg total) by mouth 2 (two) times daily 180 tablet 1   mirtazapine  (REMERON ) 15 MG tablet TAKE 1 TABLET BY MOUTH AT BEDTIME 90 tablet 0   MULTIVITAMIN ORAL Take 1 tablet by mouth once daily.      nitroGLYcerin  (NITROSTAT ) 0.4 MG SL tablet Place 0.4 mg under the tongue every 5 (five) minutes as needed     rosuvastatin  (CRESTOR ) 20 MG tablet Take 1 tablet by mouth once daily     saw palmetto fruit 450 mg Cap Take 1 tablet by mouth once daily.      trospium  (SANCTURA ) 20 mg tablet Take 20 mg by mouth 2 (two) times daily     blood glucose diagnostic (ACCU-CHEK GUIDE TEST STRIPS) test strip 1 each (1 strip total) once daily Use as instructed. E11.42 100 each 1   divalproex  (DEPAKOTE ) 125 MG DR tablet Take 1 tablet by mouth twice daily (Patient not taking: Reported on 01/01/2024) 60 tablet 0   gabapentin  (NEURONTIN ) 300 MG capsule Take 1 capsule (300 mg total) by mouth at bedtime (Patient not taking: Reported on 10/02/2023) 90 capsule 2   glipiZIDE  (GLUCOTROL  XL) 5 MG XL tablet Take 1 tablet (5 mg total) by mouth once daily 90 tablet 4   No current facility-administered medications for this visit.    Allergies: Allergies  Allergen Reactions   Azithromycin Anaphylaxis   Cephalosporins Anaphylaxis   Codeine Anaphylaxis   Doxycycline Anaphylaxis   Codeine Sulfate Unknown   Entex [Pseudoephedrine Tannate] Unknown    Other reaction(s): Unknown   Oraxyl [Doxycycline Hyclate] Unknown   Penicillins Rash   Sulfa (Sulfonamide Antibiotics) Rash   Amoxicillin Unknown and Rash   Levaquin  [Levofloxacin ] Rash     Quinolone antibiotic allergy - per patient can tolerate Cipro without any issue.    Sulfasalazine Rash    Physical Exam: Vitals:   01/01/24 1428  BP: 128/70  Pulse: 84  SpO2: 98%  Weight: 73.5 kg (162 lb)  Height: 168.9 cm (5' 6.5)     Body mass index is 25.76 kg/m. Gen:  WDWN in NAD    Physical exam otherwise deferred due to coronavirus precautions   Labs: 09/21/2016: A1c = 6.4 03/20/2017: A1c = 6.3 07/03/2017: TSH = 5.72 (0.45-4.5).  Testosterone = 391. 08/28/2017: K/Cr/Ca = 4.6/0.9/9.8.  Cholesterol = 126/107/50.9/54.  LFTs normal. 09/13/2017: A1c = 6.7. 01/17/2018: TSH = 4.66, total T4 = 7.9 03/06/2018: K/Cr/Ca = 5.0/0.9/9.7.  Cholesterol = 150/126/54.2/71.  LFTs normal. 05/21/2018: A1c = 6.7.  03/25/2019: A1c = 6.6. K/Cr/Ca = 4.7/0.9/9.6. Cholesterol = 149/137/54.9/67. LFTs normal. 07/25/2019: A1c = 6.4 08/07/2019: K/Cr/Ca = 4.7/0.88/9.6. TSH = 3.78.  05/13/2020: A1c = 6.9, K/Cr/Ca = 4.5/0.9/9.3, MA = 13.9, Chol = 115/167/41.9/40, LFTs nl. 09/14/2020: A1c = 6.6.  09/15/2020:  TSH=3.29.  05/19/2021:  K/Cr/Ca = 5.0/0.9/9.7.  MA = 23.8.  Chol = 149/147/52.3/67.  LFTs nl.  09/22/2021:  A1c = 6.7.  TSH = 3.017  08/04/2022:  K/Cr/Ca = 4.6/0.9/9.8.  MA = 30.1.  Chol = 119/138/51.7/40.  LFTs nl.  09/28/2022:  A1c = 6.3. TSH = 3.221.  06/25/2023: TSH = 2.804. 09/06/2023: A1c = 6.0. K/Cr/Ca = 4.6/0.8/9.8, eGFR = 85.  MA = 21.4.  Chol = 122/140/44.3/50.  LFTs nl. B12 = > 1,500.  01/01/2024: A1c = 6.2.  Assessment/Plan: 1.  Type 2 diabetes.  His A1c today is 6.2.   Based on review of his A1c and meter memory, I will make no changes to his medical regimen. I sent an rx for 5 mg of Glipizide .  He was previously intolerant of metformin  and also possibly Actos.   2.  Hyperlipidemia associated with diabetes.  His LDL in 8/25 was 50 on Zetia  and rosuvastatin .  3.  Elevated TSH.  His TSH was mildly elevated when checked in 6/19.  It was normal in 12/19 and has been since, most recently in 5/25.    4.  Hypertension associated with diabetes.  He currently only takes a low-dose of Lopressor  once a day.  Blood pressure good today.  5. Neuropathy.  His filament is intermittently abnormal by exam in 8/24,  which may have been due to diabetes or age.  We previously discussed foot care and he knows to be careful with his feet.  6.  Prophylaxis. I will defer a foot exam as he saw podiatry in 8/25.  We last got a copy from the eye doctor ([facility]) on 06/13/2023 and had a negative evaluation.  He is to f/u in 5/26.   7.  He will return to clinic in 6 months.  02/14/24:  Pt called.  C/o of higher blood sugars.  Asked if steroids, diet change, etc.  If yes, wait it out.  If no, increase to 10mg  glipizide  xl daily.  This note is partially prepared by Earla Daria Messier, Scribe, in the presence of and acting as the scribe of Dr. Debby Breaker , MD.    [facility], MD

## 2024-01-14 ENCOUNTER — Other Ambulatory Visit: Payer: Self-pay | Admitting: Cardiology

## 2024-01-14 ENCOUNTER — Ambulatory Visit: Admitting: Podiatry

## 2024-01-14 DIAGNOSIS — Z91199 Patient's noncompliance with other medical treatment and regimen due to unspecified reason: Secondary | ICD-10-CM

## 2024-01-14 NOTE — Progress Notes (Signed)
 1. No-show for appointment

## 2024-02-15 ENCOUNTER — Other Ambulatory Visit: Payer: Self-pay

## 2024-02-15 ENCOUNTER — Emergency Department

## 2024-02-15 ENCOUNTER — Inpatient Hospital Stay
Admission: EM | Admit: 2024-02-15 | Discharge: 2024-02-20 | DRG: 871 | Disposition: A | Discharge status: Skilled Nursing Facility | Attending: Osteopathic Medicine | Admitting: Osteopathic Medicine

## 2024-02-15 DIAGNOSIS — Z953 Presence of xenogenic heart valve: Secondary | ICD-10-CM

## 2024-02-15 DIAGNOSIS — N400 Enlarged prostate without lower urinary tract symptoms: Secondary | ICD-10-CM | POA: Diagnosis present

## 2024-02-15 DIAGNOSIS — Z7982 Long term (current) use of aspirin: Secondary | ICD-10-CM

## 2024-02-15 DIAGNOSIS — I10 Essential (primary) hypertension: Secondary | ICD-10-CM | POA: Diagnosis present

## 2024-02-15 DIAGNOSIS — Z8042 Family history of malignant neoplasm of prostate: Secondary | ICD-10-CM

## 2024-02-15 DIAGNOSIS — J9601 Acute respiratory failure with hypoxia: Principal | ICD-10-CM

## 2024-02-15 DIAGNOSIS — Z85828 Personal history of other malignant neoplasm of skin: Secondary | ICD-10-CM | POA: Diagnosis not present

## 2024-02-15 DIAGNOSIS — E7800 Pure hypercholesterolemia, unspecified: Secondary | ICD-10-CM | POA: Diagnosis present

## 2024-02-15 DIAGNOSIS — Z87891 Personal history of nicotine dependence: Secondary | ICD-10-CM

## 2024-02-15 DIAGNOSIS — A419 Sepsis, unspecified organism: Secondary | ICD-10-CM | POA: Diagnosis present

## 2024-02-15 DIAGNOSIS — Z79899 Other long term (current) drug therapy: Secondary | ICD-10-CM

## 2024-02-15 DIAGNOSIS — I2510 Atherosclerotic heart disease of native coronary artery without angina pectoris: Secondary | ICD-10-CM | POA: Diagnosis present

## 2024-02-15 DIAGNOSIS — Z9842 Cataract extraction status, left eye: Secondary | ICD-10-CM

## 2024-02-15 DIAGNOSIS — Z9841 Cataract extraction status, right eye: Secondary | ICD-10-CM

## 2024-02-15 DIAGNOSIS — I5032 Chronic diastolic (congestive) heart failure: Secondary | ICD-10-CM | POA: Diagnosis present

## 2024-02-15 DIAGNOSIS — Z882 Allergy status to sulfonamides status: Secondary | ICD-10-CM

## 2024-02-15 DIAGNOSIS — F03A0 Unspecified dementia, mild, without behavioral disturbance, psychotic disturbance, mood disturbance, and anxiety: Secondary | ICD-10-CM | POA: Diagnosis present

## 2024-02-15 DIAGNOSIS — I110 Hypertensive heart disease with heart failure: Secondary | ICD-10-CM | POA: Diagnosis present

## 2024-02-15 DIAGNOSIS — Z1152 Encounter for screening for COVID-19: Secondary | ICD-10-CM

## 2024-02-15 DIAGNOSIS — Z885 Allergy status to narcotic agent status: Secondary | ICD-10-CM

## 2024-02-15 DIAGNOSIS — Z9049 Acquired absence of other specified parts of digestive tract: Secondary | ICD-10-CM

## 2024-02-15 DIAGNOSIS — J9621 Acute and chronic respiratory failure with hypoxia: Secondary | ICD-10-CM | POA: Diagnosis present

## 2024-02-15 DIAGNOSIS — Z881 Allergy status to other antibiotic agents status: Secondary | ICD-10-CM | POA: Diagnosis not present

## 2024-02-15 DIAGNOSIS — J189 Pneumonia, unspecified organism: Secondary | ICD-10-CM | POA: Diagnosis present

## 2024-02-15 DIAGNOSIS — E1169 Type 2 diabetes mellitus with other specified complication: Secondary | ICD-10-CM | POA: Diagnosis present

## 2024-02-15 DIAGNOSIS — Z794 Long term (current) use of insulin: Secondary | ICD-10-CM

## 2024-02-15 DIAGNOSIS — Z7984 Long term (current) use of oral hypoglycemic drugs: Secondary | ICD-10-CM

## 2024-02-15 DIAGNOSIS — Z961 Presence of intraocular lens: Secondary | ICD-10-CM | POA: Diagnosis present

## 2024-02-15 DIAGNOSIS — Z515 Encounter for palliative care: Secondary | ICD-10-CM

## 2024-02-15 DIAGNOSIS — Z66 Do not resuscitate: Secondary | ICD-10-CM | POA: Diagnosis present

## 2024-02-15 DIAGNOSIS — E7849 Other hyperlipidemia: Secondary | ICD-10-CM | POA: Diagnosis present

## 2024-02-15 DIAGNOSIS — Z87442 Personal history of urinary calculi: Secondary | ICD-10-CM

## 2024-02-15 DIAGNOSIS — Z955 Presence of coronary angioplasty implant and graft: Secondary | ICD-10-CM

## 2024-02-15 DIAGNOSIS — Z9861 Coronary angioplasty status: Secondary | ICD-10-CM | POA: Diagnosis not present

## 2024-02-15 DIAGNOSIS — E119 Type 2 diabetes mellitus without complications: Secondary | ICD-10-CM

## 2024-02-15 DIAGNOSIS — D696 Thrombocytopenia, unspecified: Secondary | ICD-10-CM | POA: Diagnosis present

## 2024-02-15 DIAGNOSIS — N3281 Overactive bladder: Secondary | ICD-10-CM | POA: Diagnosis present

## 2024-02-15 LAB — TROPONIN T, HIGH SENSITIVITY
Troponin T High Sensitivity: 26 ng/L — ABNORMAL HIGH (ref 0–19)
Troponin T High Sensitivity: 25 ng/L — ABNORMAL HIGH (ref 0–19)

## 2024-02-15 LAB — VALPROIC ACID LEVEL: Valproic Acid Lvl: <10 ug/mL — ABNORMAL LOW (ref 50–100)

## 2024-02-15 LAB — CBC
HCT: 45.5 % (ref 39.0–52.0)
Hemoglobin: 14.8 g/dL (ref 13.0–17.0)
MCH: 30.9 pg (ref 26.0–34.0)
MCHC: 32.5 g/dL (ref 30.0–36.0)
MCV: 95 fL (ref 80.0–100.0)
Platelets: 135 K/uL — ABNORMAL LOW (ref 150–400)
RBC: 4.79 MIL/uL (ref 4.22–5.81)
RDW: 12.8 % (ref 11.5–15.5)
WBC: 12 K/uL — ABNORMAL HIGH (ref 4.0–10.5)
nRBC: 0 % (ref 0.0–0.2)

## 2024-02-15 LAB — BASIC METABOLIC PANEL WITH GFR
Anion gap: 12 (ref 5–15)
BUN: 19 mg/dL (ref 8–23)
CO2: 24 mmol/L (ref 22–32)
Calcium: 9.7 mg/dL (ref 8.9–10.3)
Chloride: 102 mmol/L (ref 98–111)
Creatinine, Ser: 0.85 mg/dL (ref 0.61–1.24)
GFR, Estimated: >60 mL/min
Glucose, Bld: 153 mg/dL — ABNORMAL HIGH (ref 70–99)
Potassium: 4.4 mmol/L (ref 3.5–5.1)
Sodium: 138 mmol/L (ref 135–145)

## 2024-02-15 LAB — LACTIC ACID, PLASMA: Lactic Acid, Venous: 2.3 mmol/L (ref 0.5–1.9)

## 2024-02-15 LAB — MAGNESIUM: Magnesium: 1.9 mg/dL (ref 1.7–2.4)

## 2024-02-15 LAB — RESP PANEL BY RT-PCR (RSV, FLU A&B, COVID)  RVPGX2
Influenza A by PCR: NEGATIVE
Influenza B by PCR: NEGATIVE
Resp Syncytial Virus by PCR: NEGATIVE
SARS Coronavirus 2 by RT PCR: NEGATIVE

## 2024-02-15 LAB — CBG MONITORING, ED: Glucose-Capillary: 128 mg/dL — ABNORMAL HIGH (ref 70–99)

## 2024-02-15 MED ORDER — INSULIN ASPART 100 UNIT/ML IJ SOLN: 0.0000 [IU] | Freq: Every day | INTRAMUSCULAR | Status: DC

## 2024-02-15 MED ORDER — ACETAMINOPHEN 650 MG RE SUPP: 650.0000 mg | Freq: Four times a day (QID) | RECTAL | Status: DC | PRN

## 2024-02-15 MED ORDER — SODIUM CHLORIDE 0.9 % IV SOLN
2.0000 g | INTRAVENOUS | Status: AC
  Administered 2024-02-16 – 2024-02-19 (×4): 2 g via INTRAVENOUS
  Filled 2024-02-15 (×5): qty 20

## 2024-02-15 MED ORDER — INSULIN ASPART 100 UNIT/ML IJ SOLN
0.0000 [IU] | Freq: Three times a day (TID) | INTRAMUSCULAR | Status: DC
  Administered 2024-02-16: 3 [IU] via SUBCUTANEOUS
  Administered 2024-02-17: 1 [IU] via SUBCUTANEOUS
  Administered 2024-02-17: 2 [IU] via SUBCUTANEOUS
  Filled 2024-02-15: qty 2
  Filled 2024-02-15: qty 3
  Filled 2024-02-15: qty 1

## 2024-02-15 MED ORDER — SODIUM CHLORIDE 0.9 % IV SOLN: 1.0000 g | INTRAVENOUS | Status: DC

## 2024-02-15 MED ORDER — ACETAMINOPHEN 325 MG PO TABS: 650.0000 mg | ORAL_TABLET | Freq: Four times a day (QID) | ORAL | Status: DC | PRN

## 2024-02-15 MED ORDER — LACTATED RINGERS IV SOLN: INTRAVENOUS | Status: AC

## 2024-02-15 MED ORDER — SODIUM CHLORIDE 0.9 % IV SOLN: 2.0000 g | Freq: Once | INTRAVENOUS | Status: DC

## 2024-02-15 MED ORDER — HEPARIN SODIUM (PORCINE) 5000 UNIT/ML IJ SOLN
5000.0000 [IU] | Freq: Three times a day (TID) | INTRAMUSCULAR | Status: DC
  Administered 2024-02-15 – 2024-02-17 (×7): 5000 [IU] via SUBCUTANEOUS
  Filled 2024-02-15 (×7): qty 1

## 2024-02-15 MED ORDER — SODIUM CHLORIDE 0.9 % IV BOLUS
1000.0000 mL | Freq: Once | INTRAVENOUS | Status: AC
  Administered 2024-02-15: 1000 mL via INTRAVENOUS

## 2024-02-15 MED ORDER — SODIUM CHLORIDE 0.9% FLUSH
3.0000 mL | Freq: Two times a day (BID) | INTRAVENOUS | Status: DC
  Administered 2024-02-15 – 2024-02-20 (×10): 3 mL via INTRAVENOUS

## 2024-02-15 MED ORDER — SENNOSIDES-DOCUSATE SODIUM 8.6-50 MG PO TABS: 1.0000 | ORAL_TABLET | Freq: Every evening | ORAL | Status: DC | PRN

## 2024-02-15 MED ORDER — SODIUM CHLORIDE 0.9 % IV SOLN
1.0000 g | Freq: Once | INTRAVENOUS | Status: AC
  Administered 2024-02-15: 1 g via INTRAVENOUS
  Filled 2024-02-15: qty 10

## 2024-02-15 NOTE — ED Provider Notes (Signed)
 "  [facility] Provider Note    Event Date/Time   First MD Initiated Contact with Patient 02/15/24 1210     (approximate)   History   Cough   HPI  Victor [name] is a 89 y.o. male with type 2 diabetes who comes in with a cough that started yesterday.  Patient noted to be 85% on room air so placed on 4 L.  According to patient's wife he has been having a cough over the past 1 week but it seemed to have gotten worse over the past 24 hours.  He denies any falls hitting his head no pain in his chest or abdomen.  Denies any leg swelling or leg pain.  He has no history of blood clots.  They report he is otherwise been acting his normal self.     Physical Exam   Triage Vital Signs: ED Triage Vitals [02/15/24 1105]  Encounter Vitals Group     BP 111/70     Girls Systolic BP Percentile      Girls Diastolic BP Percentile      Boys Systolic BP Percentile      Boys Diastolic BP Percentile      Pulse Rate (!) 113     Resp 20     Temp 98.2 F (36.8 C)     Temp Source Oral     SpO2 96 %     Weight      Height      Head Circumference      Peak Flow      Pain Score 0     Pain Loc      Pain Education      Exclude from Growth Chart     Most recent vital signs: Vitals:   02/15/24 1105  BP: 111/70  Pulse: (!) 113  Resp: 20  Temp: 98.2 F (36.8 C)  SpO2: 96%     General: Awake, no distress.  CV:  Good peripheral perfusion.  Resp:  Normal effort.  Clear lungs, wet sounding cough Abd:  No distention.  Soft and nontender Other:  Dressing over the top of the head as patient reports having cancer removed from the top of his head No swelling legs.  No calf tenderness Moving legs moving arms.  Cranial nerves grossly appear intact  ED Results / Procedures / Treatments   Labs (all labs ordered are listed, but only abnormal results are displayed) Labs Reviewed  BASIC METABOLIC PANEL WITH GFR - Abnormal; Notable for the following components:      Result  Value   Glucose, Bld 153 (*)    All other components within normal limits  CBC - Abnormal; Notable for the following components:   WBC 12.0 (*)    Platelets 135 (*)    All other components within normal limits  TROPONIN T, HIGH SENSITIVITY - Abnormal; Notable for the following components:   Troponin T High Sensitivity 26 (*)    All other components within normal limits     EKG  My interpretation of EKG:  Sinus tachycardia rate of 114 without any ST elevation or T wave inversions, normal intervals  RADIOLOGY I have reviewed the xray personally and interpreted concerning for possible infection in the left lung base   PROCEDURES:  Critical Care performed: Yes, see critical care procedure note(s)  .1-3 Lead EKG Interpretation  Performed by: Ernest Ronal BRAVO, MD Authorized by: Ernest Ronal BRAVO, MD     Interpretation: abnormal  ECG rate:  105   ECG rate assessment: tachycardic     Rhythm: sinus tachycardia     Ectopy: none     Conduction: normal   .Critical Care  Performed by: Ernest Ronal BRAVO, MD Authorized by: Ernest Ronal BRAVO, MD   Critical care provider statement:    Critical care time (minutes):  30   Critical care was necessary to treat or prevent imminent or life-threatening deterioration of the following conditions:  Respiratory failure   Critical care was time spent personally by me on the following activities:  Development of treatment plan with patient or surrogate, discussions with consultants, evaluation of patient's response to treatment, examination of patient, ordering and review of laboratory studies, ordering and review of radiographic studies, ordering and performing treatments and interventions, pulse oximetry, re-evaluation of patient's condition and review of old charts    MEDICATIONS ORDERED IN ED: Medications  sodium chloride  0.9 % bolus 1,000 mL (has no administration in time range)     IMPRESSION / MDM / ASSESSMENT AND PLAN / ED COURSE  I reviewed the  triage vital signs and the nursing notes.   Patient's presentation is most consistent with acute presentation with potential threat to life or bodily function.   Patient comes in with concerns for worsening cough slightly tachycardic.  X-ray concerning for pneumonia and symptoms seem consistent with pneumonia patient has a wet sounding cough they deny any blood.  They deny any history of blood clots, no evidence of DVT on examination and given x-ray concerning for pneumonia with patient's elevated white count of 12 I suspect that this is more likely related to pneumonia at this time.  Patient met sepsis criteria so blood cultures, lactate were ordered.  I will discuss the pharmacist on antibiotic choice given his allergies and I will discuss with the hospital team for admission.  Patient was hypoxic to 85% on room air so placed on 4 L.  The patient is on the cardiac monitor to evaluate for evidence of arrhythmia and/or significant heart rate changes.      FINAL CLINICAL IMPRESSION(S) / ED DIAGNOSES   Final diagnoses:  Acute respiratory failure with hypoxia (HCC)  Pneumonia of left lower lobe due to infectious organism     Rx / DC Orders   ED Discharge Orders     None        Note:  This document was prepared using Dragon voice recognition software and may include unintentional dictation errors.   Ernest Ronal BRAVO, MD 02/15/24 1231  "

## 2024-02-15 NOTE — ED Notes (Signed)
 Report given to [place], RN

## 2024-02-15 NOTE — ED Notes (Addendum)
 Vitals stable. Pt denies SOB, or any acute discomfort

## 2024-02-15 NOTE — ED Triage Notes (Signed)
 First nurse note: pt to ED [facility] from home for cough started yesterday, O2 85% RA. On 4 L [place].

## 2024-02-15 NOTE — ED Triage Notes (Signed)
 Pt to ED via [facility] from home. EMS reports wife is concerned about a cough that started yesterday. EMS states RA 85% and placed on 4L [place] with improvement to 96%. Pt A&Ox3 in triage and HOH.

## 2024-02-15 NOTE — H&P (Signed)
 " History and Physical    Victor [name]:[id number] DOB: 21-Jun-1931 DOA: 02/15/2024  DOS: the patient was seen and examined on 02/15/2024  PCP: [name], MD   Patient coming from: Home  I have personally briefly reviewed patient's old medical records in [facility] and CareEverywhere  HPI:   Victor [name] is a 89 y.o. year old male with medical history of hypertension, hyperlipidemia, type 2 diabetes, dementia, BPH presented to the ED with coughing and shortness of breath.  Patient states coughing has been present for 1 week but shortness of breath started yesterday.  Denies any sick contacts.  Per wife patient at baseline mobilizes with a walker.  He does not smoke cigarettes but drinks alcohol  occasionally.  On arrival to the ED patient was noted to be HDS stable.  Lab work and imaging obtained.  CBC with mild leukocytosis at 12k, mild thrombocytopenia that appears to be chronic and stable.  BMP overall unremarkable.  Troponin mildly elevated at 26 with repeat pending.  Respiratory panel ordered, blood cultures ordered, lactic acid ordered, chest x-ray concerning for pneumonia. Given need for continued care, [facility] contacted for admission.  It was noted patient had multiple allergies listed and was not started on antibiotics prior to call for admission.  Discussed with EDP and pharmacy to challenge him with ceftriaxone  as he was recently on cephalexin and appears to have tolerated that well.  Discussed risks and benefits associated with starting ceftriaxone  and wife is in agreement to start antibiotics with ceftriaxone .  States she would not like to start any medicine that ends in cillin.    Review of Systems: As mentioned in the history of present illness. All other systems reviewed and are negative.   Past Medical History:  Diagnosis Date   Angina    Arthritis    BPH (benign prostatic hypertrophy)    CAD S/P percutaneous coronary angioplasty 07/2006   PCI to RCA - Promus  DES 2.5 mm x 23 mm; 2D ECHO - EF >55% --> Cath 05/2019 - ~50-60% pRCA with patent stent.  70% ost D1 (small).     Diabetes mellitus    Diverticulitis    s/p colectomy   GERD (gastroesophageal reflux disease)    H/O Severe aortic stenosis 05/2019   Pre-TAVR mean AVA 52.5 mmHg.  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   Severe mitral valve regurgitation with mitral prolapse and flail P3 segment    Squamous cell skin cancer, nasal tip     Past Surgical History:  Procedure Laterality Date   CARDIAC CATHETERIZATION with PCI  08/27/2006   RCA-mid - 2.5x67mm Promus stent   CATARACT EXTRACTION W/ INTRAOCULAR LENS  IMPLANT, BILATERAL     CATARACT EXTRACTION W/PHACO Right 03/08/2015   Procedure: CATARACT EXTRACTION PHACO AND INTRAOCULAR LENS PLACEMENT (IOC);  Surgeon: Steven Dingeldein, MD;  Location: [facility] ORS;  Service: Ophthalmology;  Laterality: Right;  US : 01:29.4    COLECTOMY  ~ 2000   LEFT HEART CATHETERIZATION WITH CORONARY ANGIOGRAM N/A 03/22/2011   Procedure:  LEFT HEART CATHETERIZATION WITH CORONARY ANGIOGRAM;  Surgeon: Alm LELON Clay, MD;  Location: [facility] CATH LAB;  Service: Cardiovascular::: Patent RCA stent w/ progression of pRCA Dz to ~50-60%. Progression of oD3 lesion to 60-70% - not optimal for PCI (b/c ostial).  EF 55-60% - no RWMA. Med Rx.  Aortic valve gradient: Peak 22 mmHg, mean 13 mmHg   NM MYOVIEW  LTD  08/2014   LOW RISK. NORMAL.  EF 45-54%.    RIGHT/LEFT HEART CATH AND CORONARY ANGIOGRAPHY N/A 06/25/2019   Procedure: RIGHT/LEFT HEART CATH AND CORONARY ANGIOGRAPHY;  Surgeon: Clay Alm LELON, MD;  Location: [facility] INVASIVE CV LAB;   Severe AS (mean gradent 44.2 mmHg). Stable CAD - 50-60% pRCA with widely patent stent.  Small caliber D2 w/ ost 70%. Normal RHC Pressures.   TEE WITHOUT CARDIOVERSION N/A 07/29/2019   Procedure: TRANSESOPHAGEAL ECHOCARDIOGRAM (TEE);  Surgeon: Verlin Lonni BIRCH, MD;  Location: [facility] INVASIVE CV LAB;  Service: Open Heart Surgery;  Laterality: N/A;   TRANSCATHETER AORTIC VALVE REPLACEMENT, TRANSFEMORAL N/A 07/29/2019   Procedure: TRANSCATHETER AORTIC VALVE REPLACEMENT, TRANSFEMORAL;  Surgeon: Verlin Lonni BIRCH, MD;  Location: [facility] INVASIVE CV LAB;; R TFA Approach -> Edwards Sapien 3 THV (size 29 mm, model # [id number], serial # [id number])   TRANSESOPHAGEAL ECHOCARDIOGRAM (CATH LAB) N/A 12/29/2022   Procedure: TRANSESOPHAGEAL ECHOCARDIOGRAM;  Surgeon: Barbaraann Darryle Ned, MD;  Location: [facility] INVASIVE CV LAB;  Service: Cardiovascular;  Laterality: N/A;   TRANSTHORACIC ECHOCARDIOGRAM  03/2019   a) 03/2019: calcified aortic valve-severe stenosis.  AVA 0.79 cm.  Mean gradient 42.3 mmHg.  EF 60 to 65%.;; b) 05/2019: Severe/critical AS -V-max 4.32m/s, mean gradient 52-53 mmHg, AVA 0.49 cm. EF 60-65%. No RWMA. Gr 1 DD?SABRA Mod-Severe LA dilation   TRANSTHORACIC ECHOCARDIOGRAM  07/2019   a) 07/2019: POD #1 TAVR: EF 60 to 65%.  Moderate LA dilation.  Mild to moderate MR.  Well seated, nl fxning  TAVR valve.  Mean gradient 5 mmHg.;; b) 08/2019: EF 60 to 65%.  Normal RV function.  GR 1 DD.  No R WMA.  Moderate LA dilation.  Mild to moderate MR.  Mild to moderate TR.  Stable Edwards Sapien 29 mmTAVR valve in aortic position -normal function.  Mean gradient 8 mmHg.   TRANSTHORACIC ECHOCARDIOGRAM  08/18/2020   29 mm Sapien bioprosthetic TAVR well-positioned.  No PVL.  Mean gradient 9 mmHg-no residual AS.  Normal LV function with EF 55 to 60%.  No R WMA.  Mild concentric LVH.  GR 1 DD with mild LA and RA dilation.  Normal RV size and function.  Normal RVP/RAP.  Mild MV degeneration but no MR or MS.   TRANSURETHRAL  RESECTION OF PROSTATE  ~ 2010     Allergies[1]  Family History  Problem Relation Age of Onset   ALS Mother    Prostate cancer Father     Prior to Admission medications  Medication Sig Start Date End Date Taking? Authorizing Provider  acetaminophen  (TYLENOL ) 500 MG tablet Take 500 mg by mouth every 6 (six) hours as needed for moderate pain or headache.    [provider]  ascorbic acid  (VITAMIN C ) 500 MG tablet Take 1,000-1,500 mg by mouth See admin instructions. Take 1000 mg in the morning and 1500 mg at night    [provider]  aspirin  EC 81 MG tablet Take 81 mg by mouth daily.    [provider]  benzonatate  (TESSALON ) 200 MG capsule Take 200 mg by mouth 3 (three) times daily as  needed for cough.    [provider]  bismuth subsalicylate (PEPTO BISMOL) 262 MG/15ML suspension Take 30 mLs by mouth every 6 (six) hours as needed for indigestion or diarrhea or loose stools.    [provider]  cetirizine  (ZYRTEC ) 10 MG tablet Take 10 mg by mouth as needed.    [provider]  Cholecalciferol  (VITAMIN D3) 2000 UNITS TABS Take 4,000 Units by mouth 2 (two) times daily.     [provider]  CINNAMON PO Take 1,000 mg by mouth 2 (two) times daily.    [provider]  clindamycin  (CLEOCIN ) 300 MG capsule Take 2 capsules (600 mg) 1 hour prior to all dental visits. 08/05/20   Sebastian Lamarr SAUNDERS, PA-C  Coenzyme Q10 (COQ-10) 100 MG CAPS Take 100 mg by mouth daily.    [provider]  Cyanocobalamin  (B-12) 2500 MCG TABS Take 2,500 mcg by mouth daily.    [provider]  divalproex  (DEPAKOTE ) 125 MG DR tablet Take 125 mg by mouth 2 (two) times daily. 12/12/22 09/27/23  [provider]  donepezil  (ARICEPT ) 10 MG tablet Take 10 mg by mouth daily. 11/11/12   [provider]  EPIPEN  2-PAK 0.3 MG/0.3ML SOAJ injection Inject 0.3 mLs as directed as needed for anaphylaxis.  06/01/14   [provider]   ezetimibe  (ZETIA ) 10 MG tablet Take 10 mg by mouth daily. 04/04/18   [provider]  fluticasone  (FLONASE ) 50 MCG/ACT nasal spray Place 2 sprays into both nostrils 2 (two) times daily as needed for allergies.     [provider]  gabapentin  (NEURONTIN ) 100 MG capsule Take 300 mg by mouth at bedtime.     [provider]  glipiZIDE  (GLUCOTROL  XL) 5 MG 24 hr tablet Take 5 mg by mouth daily with breakfast.    [provider]  Glucosamine HCl 1000 MG TABS Take 1,000 mg by mouth 2 (two) times daily.    [provider]  ipratropium (ATROVENT ) 0.03 % nasal spray Place 2 sprays into both nostrils 2 (two) times daily as needed for rhinitis.    [provider]  loperamide (IMODIUM A-D) 2 MG tablet Take 2 mg by mouth 2 (two) times daily.    [provider]  meloxicam (MOBIC) 15 MG tablet Take 15 mg by mouth daily as needed for pain.    [provider]  memantine  (NAMENDA ) 5 MG tablet Take 5 mg by mouth daily. 06/06/22   [provider]  MIEBO 1.338 GM/ML SOLN Apply 1 drop to eye 4 (four) times daily. 07/02/23   [provider]  mirtazapine  (REMERON ) 15 MG tablet Take 15 mg by mouth at bedtime. 12/10/22   [provider]  Multiple Vitamin (MULITIVITAMIN WITH MINERALS) TABS Take 1 tablet by mouth 2 (two) times daily.     [provider]  nitroGLYCERIN  (NITROSTAT ) 0.4 MG SL tablet Place 0.4 mg under the tongue every 5 (five) minutes as needed. 03/22/11   [provider]  Omega 3 1200 MG CAPS Take 2,400 mg by mouth 2 (two) times daily.     [provider]  Polyethyl Glycol-Propyl Glycol (SYSTANE) 0.4-0.3 % SOLN Place 1 drop into both eyes daily.    [provider]  Probiotic CAPS Take 1 capsule by mouth daily.    [provider]  rosuvastatin  (CRESTOR ) 20 MG tablet TAKE 2 TABLETS BY MOUTH MONDAY, WEDNESDAY, AND FRIDAY AND 1 TABLET SATURDAY, SUNDAY, TUESDAY, AND THURSDAY 01/15/24    Anner Lenis  W, MD  Saw Palmetto 450 MG CAPS Take 900 mg by mouth daily.     [provider]  trospium  (SANCTURA ) 20 MG tablet Take 1 tablet (20 mg total) by mouth 2 (two) times daily. 04/24/23   Stoioff, Glendia BROCKS, MD  Vibegron  (GEMTESA ) 75 MG TABS Take 1 tablet (75 mg total) by mouth daily. 04/10/23   Stoioff, Glendia BROCKS, MD  zinc  gluconate 50 MG tablet Take 50 mg by mouth daily.    [provider]    Social History:  reports that he has never smoked. He quit smokeless tobacco use about 28 years ago.  His smokeless tobacco use included chew. He reports that he does not currently use alcohol  after a past usage of about 1.0 standard drink of alcohol  per week. He reports that he does not use drugs.    Physical Exam: Vitals:   02/15/24 1105 02/15/24 1249 02/15/24 1430 02/15/24 1459  BP: 111/70  106/68 (!) 109/94  Pulse: (!) 113  92 90  Resp: 20  18   Temp: 98.2 F (36.8 C)     TempSrc: Oral     SpO2: 96% 97% 100%     Gen: NAD HENT: NCAT CV: Tachycardic rate, sinus rhythm, good radial pulse Lung: Rhonchi present, diminished at the bases Abd: No TTP, normal bowel sounds MSK: No asymmetry, good bulk and tone Neuro: alert and oriented   Labs on Admission: I have personally reviewed following labs and imaging studies  CBC: Recent Labs  Lab 02/15/24 1107  WBC 12.0*  HGB 14.8  HCT 45.5  MCV 95.0  PLT 135*   Basic Metabolic Panel: Recent Labs  Lab 02/15/24 1107  NA 138  K 4.4  CL 102  CO2 24  GLUCOSE 153*  BUN 19  CREATININE 0.85  CALCIUM  9.7   GFR: CrCl cannot be calculated (Unknown ideal weight.). Liver Function Tests: No results for input(s): AST, ALT, ALKPHOS, BILITOT, PROT, ALBUMIN in the last 168 hours. No results for input(s): LIPASE, AMYLASE in the last 168 hours. No results for input(s): AMMONIA in the last 168 hours. Coagulation Profile: No results for input(s): INR, PROTIME in the last 168 hours. Cardiac  Enzymes: No results for input(s): CKTOTAL, CKMB, CKMBINDEX, TROPONINI, TROPONINIHS in the last 168 hours. BNP (last 3 results) No results for input(s): BNP in the last 8760 hours. HbA1C: No results for input(s): HGBA1C in the last 72 hours. CBG: No results for input(s): GLUCAP in the last 168 hours. Lipid Profile: No results for input(s): CHOL, HDL, LDLCALC, TRIG, CHOLHDL, LDLDIRECT in the last 72 hours. Thyroid  Function Tests: No results for input(s): TSH, T4TOTAL, FREET4, T3FREE, THYROIDAB in the last 72 hours. Anemia Panel: No results for input(s): VITAMINB12, FOLATE, FERRITIN, TIBC, IRON, RETICCTPCT in the last 72 hours. Urine analysis:    Component Value Date/Time   COLORURINE YELLOW (A) 07/26/2023 1651   APPEARANCEUR CLEAR (A) 07/26/2023 1651   APPEARANCEUR Clear 03/29/2023 1052   LABSPEC 1.025 07/26/2023 1651   PHURINE 5.0 07/26/2023 1651   GLUCOSEU NEGATIVE 07/26/2023 1651   HGBUR NEGATIVE 07/26/2023 1651   BILIRUBINUR NEGATIVE 07/26/2023 1651   BILIRUBINUR Negative 03/29/2023 1052   KETONESUR NEGATIVE 07/26/2023 1651   PROTEINUR NEGATIVE 07/26/2023 1651   NITRITE NEGATIVE 07/26/2023 1651   LEUKOCYTESUR NEGATIVE 07/26/2023 1651    Radiological Exams on Admission: I have personally reviewed images DG Chest 2 View Result Date: 02/15/2024 EXAM: 2 VIEW(S) XRAY OF THE CHEST 02/15/2024 11:32:00 AM COMPARISON: 06/25/2023 CLINICAL HISTORY: Hypoxia/cough.  FINDINGS: LUNGS AND PLEURA: Ill-defined opacity at left lung base, which could represent atelectasis or developing infection. No pleural effusion. No pneumothorax. HEART AND MEDIASTINUM: TAVR. Atherosclerotic calcification of aortic arch. No acute abnormality of the cardiac and mediastinal silhouettes. BONES AND SOFT TISSUES: Thoracic spondylosis. IMPRESSION: 1. Ill-defined opacity at the left lung base, which could represent atelectasis or developing infection. 2. Aortic  Atherosclerosis (ICD10-I70.0). Electronically signed by: Ryan Chess MD 02/15/2024 11:55 AM EST RP Workstation: [id number]    EKG: My personal interpretation of EKG shows: pending    Assessment/Plan Active Problems:   Essential hypertension   Hyperlipidemia associated with type 2 diabetes mellitus (HCC)   Diabetes mellitus type II, controlled (HCC)   BPH (benign prostatic hyperplasia)   CAD S/P percutaneous coronary angioplasty --> PCI RCA Promus DES 2.5 mm x 23 mm   Chronic diastolic heart failure (HCC)   Mild dementia (HCC)   PNA Pt presented with AHRF 2/2 to pneumonia.  Patient meets sepsis criteria image findings consistent with pneumonia. Blood cultures ordered. Pt is status post ceftriaxone  1 g.  Patient tolerated this well we will give 1 more gram later in the evening.  And start 2 g ceftriaxone  daily.  Patient has multiple antibiotic allergies but suspect cephalosporin allergy is not present has he tolerated cephalosporin late last year.  Will defer initiating azithromycin or doxycycline as it is listed anaphylactic reaction. -Monitor fever curve, trend WBC -Follow up blood cultures  -Wean oxygen as able -Continuous pluse ox  Chronic Problems: Restart home meds once medication reconciliation is completed.  HTN: Hold home blood pressure medicine given sepsis. HLD: continue home meds T2DM: hold home meds, start on SSI and titrate Dementia: continue home medications BPH/OAB: Continue home medicines  VTE prophylaxis:  SQ Heparin   Diet: HH/Carb Code Status:  Full Code Telemetry:  Admission status: Observation, Telemetry bed Patient is from: Home Anticipated d/c is to: Home Anticipated d/c is in: 1-2 days   Family Communication: Updated at bedside  Consults called: None   Severity of Illness: The appropriate patient status for this patient is OBSERVATION. Observation status is judged to be reasonable and necessary in order to provide the required intensity of  service to ensure the patient's safety. The patient's presenting symptoms, physical exam findings, and initial radiographic and laboratory data in the context of their medical condition is felt to place them at decreased risk for further clinical deterioration. Furthermore, it is anticipated that the patient will be medically stable for discharge from the hospital within 2 midnights of admission.    Morene Bathe, MD Jolynn DEL. [facility] it     [1]  Allergies Allergen Reactions   Azithromycin Rash and Anaphylaxis   Cephalosporins Anaphylaxis and Other (See Comments)   Codeine Anaphylaxis, Itching and Rash   Doxycycline Anaphylaxis   Phenylephrine -Guaifenesin     Unknown reaction    Pseudoephedrine Other (See Comments)    unknown    Amoxicillin Rash   Levofloxacin  Rash   Penicillins Rash    Other reaction(s): Unknown   Sulfa Antibiotics Rash   Sulfasalazine Rash   "

## 2024-02-16 LAB — BASIC METABOLIC PANEL WITH GFR
Anion gap: 8 (ref 5–15)
BUN: 18 mg/dL (ref 8–23)
CO2: 26 mmol/L (ref 22–32)
Calcium: 8.7 mg/dL — ABNORMAL LOW (ref 8.9–10.3)
Chloride: 105 mmol/L (ref 98–111)
Creatinine, Ser: 0.78 mg/dL (ref 0.61–1.24)
GFR, Estimated: >60 mL/min
Glucose, Bld: 90 mg/dL (ref 70–99)
Potassium: 4.1 mmol/L (ref 3.5–5.1)
Sodium: 138 mmol/L (ref 135–145)

## 2024-02-16 LAB — CBC
HCT: 38.2 % — ABNORMAL LOW (ref 39.0–52.0)
Hemoglobin: 12.4 g/dL — ABNORMAL LOW (ref 13.0–17.0)
MCH: 31.2 pg (ref 26.0–34.0)
MCHC: 32.5 g/dL (ref 30.0–36.0)
MCV: 96.2 fL (ref 80.0–100.0)
Platelets: 102 K/uL — ABNORMAL LOW (ref 150–400)
RBC: 3.97 MIL/uL — ABNORMAL LOW (ref 4.22–5.81)
RDW: 13 % (ref 11.5–15.5)
WBC: 8.2 K/uL (ref 4.0–10.5)
nRBC: 0 % (ref 0.0–0.2)

## 2024-02-16 LAB — GLUCOSE, CAPILLARY
Glucose-Capillary: 225 mg/dL — ABNORMAL HIGH (ref 70–99)
Glucose-Capillary: 118 mg/dL — ABNORMAL HIGH (ref 70–99)

## 2024-02-16 LAB — LACTIC ACID, PLASMA: Lactic Acid, Venous: 1 mmol/L (ref 0.5–1.9)

## 2024-02-16 LAB — HEMOGLOBIN A1C
Hgb A1c MFr Bld: 6.8 % — ABNORMAL HIGH (ref 4.8–5.6)
Mean Plasma Glucose: 148.46 mg/dL

## 2024-02-16 LAB — CBG MONITORING, ED: Glucose-Capillary: 86 mg/dL (ref 70–99)

## 2024-02-16 MED ORDER — POLYMYXIN B-TRIMETHOPRIM 10000-0.1 UNIT/ML-% OP SOLN
1.0000 [drp] | Freq: Two times a day (BID) | OPHTHALMIC | Status: DC
  Administered 2024-02-16 – 2024-02-20 (×9): 1 [drp] via OPHTHALMIC
  Filled 2024-02-16: qty 10

## 2024-02-16 MED ORDER — BENZONATATE 100 MG PO CAPS: 200.0000 mg | ORAL_CAPSULE | Freq: Three times a day (TID) | ORAL | Status: DC | PRN

## 2024-02-16 MED ORDER — DEXTROMETHORPHAN POLISTIREX ER 30 MG/5ML PO SUER
15.0000 mg | Freq: Three times a day (TID) | ORAL | Status: DC
  Administered 2024-02-16 – 2024-02-20 (×11): 15 mg via ORAL
  Filled 2024-02-16 (×15): qty 5

## 2024-02-16 MED ORDER — MEMANTINE HCL 5 MG PO TABS
5.0000 mg | ORAL_TABLET | Freq: Two times a day (BID) | ORAL | Status: DC
  Administered 2024-02-16 – 2024-02-20 (×8): 5 mg via ORAL
  Filled 2024-02-16 (×10): qty 1

## 2024-02-16 MED ORDER — VITAMIN C 500 MG PO TABS: 1000.0000 mg | ORAL_TABLET | ORAL | Status: DC

## 2024-02-16 MED ORDER — VITAMIN B-12 1000 MCG PO TABS
2500.0000 ug | ORAL_TABLET | Freq: Every day | ORAL | Status: DC
  Administered 2024-02-16: 2500 ug via ORAL
  Filled 2024-02-16 (×2): qty 3

## 2024-02-16 MED ORDER — VITAMIN D3 25 MCG (1000 UNIT) PO TABS
4000.0000 [IU] | ORAL_TABLET | Freq: Two times a day (BID) | ORAL | Status: DC
  Administered 2024-02-16: 4000 [IU] via ORAL
  Filled 2024-02-16 (×3): qty 4

## 2024-02-16 MED ORDER — ADULT MULTIVITAMIN W/MINERALS CH
1.0000 | ORAL_TABLET | Freq: Two times a day (BID) | ORAL | Status: DC
  Administered 2024-02-16 (×2): 1 via ORAL
  Filled 2024-02-16 (×3): qty 1

## 2024-02-16 MED ORDER — DONEPEZIL HCL 5 MG PO TABS
10.0000 mg | ORAL_TABLET | Freq: Every day | ORAL | Status: DC
  Administered 2024-02-16 – 2024-02-20 (×4): 10 mg via ORAL
  Filled 2024-02-16 (×5): qty 2

## 2024-02-16 MED ORDER — POLYVINYL ALCOHOL 1.4 % OP SOLN
2.0000 [drp] | Freq: Every day | OPHTHALMIC | Status: DC
  Administered 2024-02-16 – 2024-02-18 (×3): 2 [drp] via OPHTHALMIC
  Filled 2024-02-16: qty 15

## 2024-02-16 MED ORDER — VITAMIN C 500 MG PO TABS
1000.0000 mg | ORAL_TABLET | Freq: Every morning | ORAL | Status: DC
  Administered 2024-02-16: 1000 mg via ORAL
  Filled 2024-02-16 (×2): qty 2

## 2024-02-16 MED ORDER — ASPIRIN 81 MG PO TBEC
81.0000 mg | DELAYED_RELEASE_TABLET | Freq: Every day | ORAL | Status: DC
  Administered 2024-02-16 – 2024-02-20 (×4): 81 mg via ORAL
  Filled 2024-02-16 (×5): qty 1

## 2024-02-16 MED ORDER — VITAMIN C 500 MG PO TABS
1500.0000 mg | ORAL_TABLET | Freq: Every day | ORAL | Status: DC
  Administered 2024-02-16: 1500 mg via ORAL
  Filled 2024-02-16: qty 3

## 2024-02-16 MED ORDER — ZINC SULFATE 220 (50 ZN) MG PO CAPS
220.0000 mg | ORAL_CAPSULE | Freq: Every day | ORAL | Status: DC
  Administered 2024-02-16: 220 mg via ORAL
  Filled 2024-02-16 (×2): qty 1

## 2024-02-16 MED ORDER — RISAQUAD PO CAPS
1.0000 | ORAL_CAPSULE | Freq: Every day | ORAL | Status: DC
  Administered 2024-02-16 – 2024-02-20 (×4): 1 via ORAL
  Filled 2024-02-16 (×5): qty 1

## 2024-02-16 MED ORDER — EZETIMIBE 10 MG PO TABS
10.0000 mg | ORAL_TABLET | Freq: Every day | ORAL | Status: DC
  Administered 2024-02-16: 10 mg via ORAL
  Filled 2024-02-16 (×2): qty 1

## 2024-02-16 MED ORDER — OMEGA-3-ACID ETHYL ESTERS 1 G PO CAPS
2000.0000 mg | ORAL_CAPSULE | Freq: Two times a day (BID) | ORAL | Status: DC
  Administered 2024-02-16 (×2): 2000 mg via ORAL
  Filled 2024-02-16 (×3): qty 2

## 2024-02-16 NOTE — Evaluation (Signed)
 Physical Therapy Evaluation Patient Details Name: Victor [name] MRN: [id number] DOB: 22-Feb-1931 Today's Date: 02/16/2024  History of Present Illness  Victor [name] is a 89 y.o. year old male with medical history of hypertension, hyperlipidemia, type 2 diabetes, dementia, BPH presented to the ED with coughing and shortness of breath.  Clinical Impression  Patient noted to be in supine position at PT arrival in room, for an initial PT evaluation due to a decline in functional status, with baseline mobility reported as modI per chart review, and currently requiring modA+ to sit EOB. The patient is A&O x 1 not completely oriented to time, self and/or situation, presenting with good willingness to work with PT. The patient resides in a house and lives with spouse with family/friend support. There are 4 STE inside the residence.  Vitals are unstable with an SpO? of 83 % on 4 L/min with needed verbal cueing for proper breathing. Out of bed mobility held due to weakness in trunk musculature. The overall clinical impression is that the patient presents with moderate mobility limitations. Recommended skilled PT will address safety, mobility, and discharge planning.        If plan is discharge home, recommend the following: Two people to help with bathing/dressing/bathroom;Two people to help with walking and/or transfers;Help with stairs or ramp for entrance;Assistance with cooking/housework;Assist for transportation;Direct supervision/assist for financial management;Direct supervision/assist for medications management;Supervision due to cognitive status   Can travel by private vehicle        Equipment Recommendations Other (comment) (TBD)  Recommendations for Other Services       Functional Status Assessment Patient has had a recent decline in their functional status and/or demonstrates limited ability to make significant improvements in function in a reasonable and predictable amount of time      Precautions / Restrictions Precautions Precautions: Fall Restrictions Weight Bearing Restrictions Per Provider Order: No      Mobility  Bed Mobility Overal bed mobility: Needs Assistance Bed Mobility: Rolling, Sidelying to Sit, Supine to Sit Rolling: Min assist Sidelying to sit: Mod assist Supine to sit: Mod assist     General bed mobility comments: not able to sit without physical assistance for greater than ~1 min.    Transfers                   General transfer comment: standing deferred due to decreased trunk strength    Ambulation/Gait                  Stairs            Wheelchair Mobility     Tilt Bed    Modified Rankin (Stroke Patients Only)       Balance Overall balance assessment: Needs assistance Sitting-balance support: Feet supported, Bilateral upper extremity supported Sitting balance-Leahy Scale: Fair Sitting balance - Comments: limited ability to independently sit unsupported                                     Pertinent Vitals/Pain Pain Assessment Pain Assessment: PAINAD Breathing: normal Negative Vocalization: none Facial Expression: smiling or inexpressive Body Language: relaxed Consolability: no need to console PAINAD Score: 0    Home Living Family/patient expects to be discharged to:: Private residence Living Arrangements: Spouse/significant other Available Help at Discharge: Family;Available 24 hours/day Type of Home: House Home Access: Stairs to enter Entrance Stairs-Rails: Lawyer of Steps: 4  Home Layout: One level Home Equipment: Tub bench;Cane - Programmer, Applications (2 wheels);Wheelchair - manual Additional Comments: daughter and a son nearby    Prior Function Prior Level of Function : Independent/Modified Independent;History of Falls (last six months)             Mobility Comments: MOD I with QC use community and household distances, does not drive, 1  recent fall ADLs Comments: IND with ADLs and assists with household chores     Extremity/Trunk Assessment   Upper Extremity Assessment Upper Extremity Assessment: Generalized weakness    Lower Extremity Assessment Lower Extremity Assessment: Generalized weakness    Cervical / Trunk Assessment Cervical / Trunk Assessment: Normal  Communication   Communication Communication: Impaired Factors Affecting Communication: Hearing impaired    Cognition Arousal: Alert, Lethargic Behavior During Therapy: WFL for tasks assessed/performed   PT - Cognitive impairments: History of cognitive impairments                         Following commands: Intact       Cueing Cueing Techniques: Verbal cues     General Comments      Exercises     Assessment/Plan    PT Assessment Patient needs continued PT services  PT Problem List Decreased strength;Decreased activity tolerance;Decreased balance;Decreased mobility;Decreased cognition       PT Treatment Interventions Gait training;Functional mobility training;Therapeutic activities;Therapeutic exercise;Balance training;Neuromuscular re-education;Patient/family education    PT Goals (Current goals can be found in the Care Plan section)  Acute Rehab PT Goals PT Goal Formulation: Patient unable to participate in goal setting    Frequency Min 2X/week     Co-evaluation               AM-PAC PT 6 Clicks Mobility  Outcome Measure Help needed turning from your back to your side while in a flat bed without using bedrails?: A Little Help needed moving from lying on your back to sitting on the side of a flat bed without using bedrails?: A Little Help needed moving to and from a bed to a chair (including a wheelchair)?: A Lot Help needed standing up from a chair using your arms (e.g., wheelchair or bedside chair)?: A Lot Help needed to walk in hospital room?: A Lot Help needed climbing 3-5 steps with a railing? : Total 6  Click Score: 13    End of Session   Activity Tolerance: Patient limited by fatigue Patient left: in bed;with bed alarm set;with call bell/phone within reach Nurse Communication: Mobility status PT Visit Diagnosis: Muscle weakness (generalized) (M62.81);Other abnormalities of gait and mobility (R26.89)    Time: 0940-1001 PT Time Calculation (min) (ACUTE ONLY): 21 min   Charges:   PT Evaluation $PT Eval Low Complexity: 1 Low   PT General Charges $$ ACUTE PT VISIT: 1 Visit         Sherlean Lesches DPT, PT    Tashan Kreitzer A Veronica Fretz 02/16/2024, 10:59 AM

## 2024-02-16 NOTE — Evaluation (Signed)
 Occupational Therapy Evaluation Patient Details Name: Victor [name] MRN: [id number] DOB: Jun 17, 1931 Today's Date: 02/16/2024   History of Present Illness   Victor [name] is a 89 y.o. year old male with medical history of hypertension, hyperlipidemia, type 2 diabetes, dementia, BPH presented to the ED with coughing and shortness of breath.     Clinical Impressions Pt seen for OT evaluation this date.  Pt's wife present and able to provide additional details regarding patients level of function prior to admission.  Pt lives with his wife, they just moved from their home to an apartment, one level with no steps to enter.  Pt does require assistance at times at home with self care tasks, supervision to get in and out of the shower, moderate assist for lower body self care and dressing.  Pt currently presents with muscle weakness, decreased functional transfers, mobility, decreased ability to perform self care tasks, decreased balance and activity tolerance. Pt currently requiring increased assistance with all tasks.  Pt would benefit from skilled OT services to maximize safety and independence in necessary daily tasks and to reduce caregiver burden.       If plan is discharge home, recommend the following:   A lot of help with walking and/or transfers;A lot of help with bathing/dressing/bathroom;Assistance with cooking/housework;Direct supervision/assist for medications management;Assist for transportation;Direct supervision/assist for financial management;Supervision due to cognitive status     Functional Status Assessment   Patient has had a recent decline in their functional status and demonstrates the ability to make significant improvements in function in a reasonable and predictable amount of time.     Equipment Recommendations         Recommendations for Other Services         Precautions/Restrictions   Precautions Precautions: Fall Restrictions Weight Bearing  Restrictions Per Provider Order: No     Mobility Bed Mobility Overal bed mobility: Needs Assistance Bed Mobility: Rolling, Sidelying to Sit, Supine to Sit Rolling: Min assist Sidelying to sit: Mod assist            Transfers                   General transfer comment: unable to stand during session after working on bed mobility, pt fatigued      Balance Overall balance assessment: Needs assistance Sitting-balance support: Feet supported, Bilateral upper extremity supported Sitting balance-Leahy Scale: Fair Sitting balance - Comments: limited endurance for sitting unsuppported                                   ADL either performed or assessed with clinical judgement   ADL Overall ADL's : Needs assistance/impaired Eating/Feeding: Modified independent   Grooming: Set up;Sitting   Upper Body Bathing: Set up   Lower Body Bathing: Maximal assistance   Upper Body Dressing : Set up   Lower Body Dressing: Maximal assistance     Toilet Transfer Details (indicate cue type and reason): NT this date due to patient fatigue           General ADL Comments: Pt requiring increased assistance with self care tasks compared to home.     Vision         Perception         Praxis         Pertinent Vitals/Pain Pain Assessment Pain Assessment: 0-10     Extremity/Trunk Assessment Upper Extremity Assessment Upper Extremity  Assessment: Generalized weakness   Lower Extremity Assessment Lower Extremity Assessment: Defer to PT evaluation   Cervical / Trunk Assessment Cervical / Trunk Assessment: Normal   Communication Communication Communication: Impaired Factors Affecting Communication: Hearing impaired   Cognition Arousal: Alert Behavior During Therapy: WFL for tasks assessed/performed Cognition: History of cognitive impairments                               Following commands: Intact       Cueing  General Comments    Cueing Techniques: Verbal cues   Pt requiring increased assistance with self care tasks, moderate to max assist for lower body self care, min to moderate assist for supine to sit and sit to supine.     Exercises     Shoulder Instructions      Home Living Family/patient expects to be discharged to:: Private residence Living Arrangements: Spouse/significant other Available Help at Discharge: Family;Available 24 hours/day Type of Home: Apartment Home Access: Stairs to enter Entrance Stairs-Number of Steps: 4 Entrance Stairs-Rails: Left;Right Home Layout: One level     Bathroom Shower/Tub: Chief Strategy Officer: Handicapped height     Home Equipment: Tub bench;Cane - Programmer, [employer] (2 wheels);Wheelchair - manual   Additional Comments: daughter and a son nearby      Prior Functioning/Environment Prior Level of Function : Independent/Modified Independent;History of Falls (last six months)             Mobility Comments: MOD I with QC use community and household distances, does not drive, 1 recent fall ADLs Comments: Wife assists with ADLs, min to moderate assist at times.  Supervision with bathing especially to get in and out of tub/shower    OT Problem List: Decreased strength;Decreased activity tolerance;Decreased cognition;Decreased knowledge of use of DME or AE;Impaired balance (sitting and/or standing)   OT Treatment/Interventions:        OT Goals(Current goals can be found in the care plan section)   Acute Rehab OT Goals Patient Stated Goal: To get out of here and do as much as I can for myself OT Goal Formulation: With patient/family Time For Goal Achievement: 03/01/24 Potential to Achieve Goals: Good   OT Frequency:  Min 2X/week    Co-evaluation              AM-PAC OT 6 Clicks Daily Activity     Outcome Measure Help from another person eating meals?: None Help from another person taking care of personal grooming?: None Help  from another person toileting, which includes using toliet, bedpan, or urinal?: A Lot Help from another person bathing (including washing, rinsing, drying)?: A Lot Help from another person to put on and taking off regular upper body clothing?: A Little Help from another person to put on and taking off regular lower body clothing?: A Lot 6 Click Score: 17   End of Session Equipment Utilized During Treatment: Gait belt  Activity Tolerance: Patient tolerated treatment well;Patient limited by fatigue Patient left: in bed;with call bell/phone within reach;with bed alarm set  OT Visit Diagnosis: Unsteadiness on feet (R26.81);Repeated falls (R29.6);Muscle weakness (generalized) (M62.81);History of falling (Z91.81)                Time: 8776-8752 OT Time Calculation (min): 24 min Charges:  OT General Charges $OT Visit: 1 Visit OT Evaluation $OT Eval Moderate Complexity: 1 Mod OT Treatments $Self Care/Home Management : 8-22 mins Katrine Radich T Kayelyn Lemon, OTR/L,  CLT Czar Ysaguirre 02/16/2024, 1:40 PM

## 2024-02-16 NOTE — Evaluation (Signed)
 Clinical/Bedside Swallow Evaluation Patient Details  Name: Victor [name] MRN: [id number] Date of Birth: 1931-02-15  Today's Date: 02/16/2024 Time: SLP Start Time (ACUTE ONLY): 1300 SLP Stop Time (ACUTE ONLY): 1330 SLP Time Calculation (min) (ACUTE ONLY): 30 min  Past Medical History:  Past Medical History:  Diagnosis Date   Angina    Arthritis    BPH (benign prostatic hypertrophy)    CAD S/P percutaneous coronary angioplasty 07/2006   PCI to RCA - Promus DES 2.5 mm x 23 mm; 2D ECHO - EF >55% --> Cath 05/2019 - ~50-60% pRCA with patent stent.  70% ost D1 (small).     Diabetes mellitus    Diverticulitis    s/p colectomy   GERD (gastroesophageal reflux disease)    H/O Severe aortic stenosis 05/2019   Pre-TAVR mean AVA 52.5 mmHg.  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   Severe mitral valve regurgitation with mitral prolapse and flail P3 segment    Squamous cell skin cancer, nasal tip    Past Surgical History:  Past Surgical History:  Procedure Laterality Date   CARDIAC CATHETERIZATION with PCI  08/27/2006   RCA-mid - 2.5x57mm Promus stent   CATARACT EXTRACTION W/ INTRAOCULAR LENS  IMPLANT, BILATERAL     CATARACT EXTRACTION W/PHACO Right 03/08/2015   Procedure: CATARACT EXTRACTION PHACO AND INTRAOCULAR LENS PLACEMENT (IOC);  Surgeon: Steven Dingeldein, MD;  Location: [facility] ORS;  Service: Ophthalmology;  Laterality: Right;  US : 01:29.4    COLECTOMY  ~ 2000   LEFT HEART CATHETERIZATION WITH CORONARY ANGIOGRAM N/A 03/22/2011   Procedure: LEFT HEART CATHETERIZATION WITH CORONARY ANGIOGRAM;  Surgeon: Alm LELON Clay, MD;  Location: [facility] CATH  LAB;  Service: Cardiovascular::: Patent RCA stent w/ progression of pRCA Dz to ~50-60%. Progression of oD3 lesion to 60-70% - not optimal for PCI (b/c ostial).  EF 55-60% - no RWMA. Med Rx.  Aortic valve gradient: Peak 22 mmHg, mean 13 mmHg   NM MYOVIEW  LTD  08/2014   LOW RISK. NORMAL.  EF 45-54%.    RIGHT/LEFT HEART CATH AND CORONARY ANGIOGRAPHY N/A 06/25/2019   Procedure: RIGHT/LEFT HEART CATH AND CORONARY ANGIOGRAPHY;  Surgeon: Clay Alm LELON, MD;  Location: [facility] INVASIVE CV LAB;  Severe AS (mean gradent 44.2 mmHg). Stable CAD - 50-60% pRCA with widely patent stent.  Small caliber D2 w/ ost 70%. Normal RHC Pressures.   TEE WITHOUT CARDIOVERSION N/A 07/29/2019   Procedure: TRANSESOPHAGEAL ECHOCARDIOGRAM (TEE);  Surgeon: Verlin Lonni BIRCH, MD;  Location: [facility] INVASIVE CV LAB;  Service: Open Heart Surgery;  Laterality: N/A;   TRANSCATHETER AORTIC VALVE REPLACEMENT, TRANSFEMORAL N/A 07/29/2019   Procedure: TRANSCATHETER AORTIC VALVE REPLACEMENT, TRANSFEMORAL;  Surgeon: Verlin Lonni BIRCH, MD;  Location: [facility] INVASIVE CV LAB;; R TFA Approach -> Edwards Sapien 3 THV (size 29 mm, model # [id number], serial # [id number])   TRANSESOPHAGEAL ECHOCARDIOGRAM (CATH LAB) N/A 12/29/2022   Procedure: TRANSESOPHAGEAL ECHOCARDIOGRAM;  Surgeon: Barbaraann Darryle Ned, MD;  Location: [facility] INVASIVE CV LAB;  Service: Cardiovascular;  Laterality: N/A;   TRANSTHORACIC ECHOCARDIOGRAM  03/2019  a) 03/2019: calcified aortic valve-severe stenosis.  AVA 0.79 cm.  Mean gradient 42.3 mmHg.  EF 60 to 65%.;; b) 05/2019: Severe/critical AS -V-max 4.26m/s, mean gradient 52-53 mmHg, AVA 0.49 cm. EF 60-65%. No RWMA. Gr 1 DD?SABRA Mod-Severe LA dilation   TRANSTHORACIC ECHOCARDIOGRAM  07/2019   a) 07/2019: POD #1 TAVR: EF 60 to 65%.  Moderate LA dilation.  Mild to moderate MR.  Well seated, nl fxning  TAVR valve.  Mean gradient 5 mmHg.;; b) 08/2019: EF 60 to 65%.  Normal RV function.  GR 1 DD.  No R WMA.  Moderate LA dilation.  Mild to  moderate MR.  Mild to moderate TR.  Stable Edwards Sapien 29 mmTAVR valve in aortic position -normal function.  Mean gradient 8 mmHg.   TRANSTHORACIC ECHOCARDIOGRAM  08/18/2020   29 mm Sapien bioprosthetic TAVR well-positioned.  No PVL.  Mean gradient 9 mmHg-no residual AS.  Normal LV function with EF 55 to 60%.  No R WMA.  Mild concentric LVH.  GR 1 DD with mild LA and RA dilation.  Normal RV size and function.  Normal RVP/RAP.  Mild MV degeneration but no MR or MS.   TRANSURETHRAL RESECTION OF PROSTATE  ~ 2010   HPI:  Per H&P, Victor [name] is a 89 y.o. year old male with medical history of hypertension, hyperlipidemia, type 2 diabetes, dementia, BPH presented to the ED with coughing and shortness of breath.  Patient states coughing has been present for 1 week but shortness of breath started yesterday.  Denies any sick contacts.  Per wife patient at baseline mobilizes with a walker.  He does not smoke cigarettes but drinks alcohol  occasionally. CXR:  Ill-defined opacity at the left lung base, which could represent atelectasis  or developing infection.    Assessment / Plan / Recommendation  Clinical Impression  Pt seen for bedside swallow evaluation in the setting of AHRF 2/2 to pneumonia. Chart review completed, no hx of dysphagia. Pt currently on 4L nasal canula with no overt, reported SOB. RN reporting significant coughing with secretions/phlegm. Wife present for evaluation- reporting intermittent coughing, though not consistent or triggered with particular consistency. Pt in the midst of lunch meal upon therapist entrance. Pt observed with trials of regular solids and thin liquid via straw- self fed. Slow rate and small bites maintained t/o trials. Single delayed, congested cough noted- though grossly consistent with baseline congested cough per family report.   Education shared regarding risk factors for aspiration, aspiration precautions and monitoring for application/safety with intake. Wife  and brother in law reported understanding.   Given age, current respiratory/pulmonary function, baseline cognitive decline (dementia), and acute deconditioning, pt is at increased risk for aspiration. However, suspect that risk is managed with application of aspiration precautions (slow rate, small bites, elevated HOB, and alert for PO intake). Continue with current unrestricted diet, with slight modification (chopped meats). Medications whole in puree. SLP will monitor peripherally for additional needs. MD and RN aware of recommendations.   SLP Visit Diagnosis: Dysphagia, unspecified (R13.10) (related to acute deconditioning and current respiratory/pulmonary function)    Aspiration Risk  Moderate aspiration risk    Diet Recommendation   Thin;Age appropriate regular- cut meats  Medication Administration: Whole meds with puree    Other Recommendations Oral Care Recommendations: Oral care BID;Staff/trained caregiver to provide oral care      Functional Status Assessment  (suspect pt is near to baseline function)    Swallow Study   General Date of Onset: 02/16/24  HPI: Per H&P, Victor [name] is a 89 y.o. year old male with medical history of hypertension, hyperlipidemia, type 2 diabetes, dementia, BPH presented to the ED with coughing and shortness of breath.  Patient states coughing has been present for 1 week but shortness of breath started yesterday.  Denies any sick contacts.  Per wife patient at baseline mobilizes with a walker.  He does not smoke cigarettes but drinks alcohol  occasionally. CXR:  Ill-defined opacity at the left lung base, which could represent atelectasis  or developing infection. Type of Study: Bedside Swallow Evaluation Previous Swallow Assessment: none in chart Diet Prior to this Study: Regular;Thin liquids (Level 0) Temperature Spikes Noted: No Respiratory Status: Nasal cannula (4L) History of Recent Intubation: No Behavior/Cognition: Alert;Cooperative Oral  Cavity Assessment: Within Functional Limits Oral Care Completed by SLP: Recent completion by staff Vision: Functional for self-feeding Self-Feeding Abilities: Able to feed self Patient Positioning: Upright in bed Baseline Vocal Quality: Normal Volitional Cough: Congested Volitional Swallow: Able to elicit    Oral/Motor/Sensory Function Overall Oral Motor/Sensory Function: Within functional limits   Ice Chips Ice chips: Not tested   Thin Liquid Thin Liquid: Within functional limits Presentation: Straw;Self Fed    Nectar Thick Nectar Thick Liquid: Not tested   Honey Thick Honey Thick Liquid: Not tested   Puree Puree: Not tested   Solid     Solid: Within functional limits Presentation: Self Fed     Victor Band Clapp, MS, CCC-SLP Speech Language Pathologist Rehab Services; [facility] [phone] (ascom)   Victor [name] 02/16/2024,3:55 PM

## 2024-02-17 DIAGNOSIS — Z9861 Coronary angioplasty status: Secondary | ICD-10-CM

## 2024-02-17 DIAGNOSIS — I2510 Atherosclerotic heart disease of native coronary artery without angina pectoris: Secondary | ICD-10-CM

## 2024-02-17 DIAGNOSIS — I5032 Chronic diastolic (congestive) heart failure: Secondary | ICD-10-CM | POA: Diagnosis not present

## 2024-02-17 DIAGNOSIS — Z515 Encounter for palliative care: Secondary | ICD-10-CM

## 2024-02-17 DIAGNOSIS — A419 Sepsis, unspecified organism: Secondary | ICD-10-CM | POA: Diagnosis not present

## 2024-02-17 DIAGNOSIS — J189 Pneumonia, unspecified organism: Secondary | ICD-10-CM | POA: Diagnosis not present

## 2024-02-17 DIAGNOSIS — J9601 Acute respiratory failure with hypoxia: Secondary | ICD-10-CM | POA: Diagnosis not present

## 2024-02-17 LAB — GLUCOSE, CAPILLARY
Glucose-Capillary: 141 mg/dL — ABNORMAL HIGH (ref 70–99)
Glucose-Capillary: 179 mg/dL — ABNORMAL HIGH (ref 70–99)
Glucose-Capillary: 145 mg/dL — ABNORMAL HIGH (ref 70–99)

## 2024-02-17 MED ORDER — SODIUM CHLORIDE 0.9 % IV SOLN: INTRAVENOUS | Status: DC

## 2024-02-17 MED ORDER — GLYCOPYRROLATE 0.2 MG/ML IJ SOLN
0.2000 mg | INTRAMUSCULAR | Status: DC | PRN
  Filled 2024-02-17: qty 1

## 2024-02-17 MED ORDER — GLYCOPYRROLATE 1 MG PO TABS
1.0000 mg | ORAL_TABLET | ORAL | Status: DC | PRN
  Filled 2024-02-17: qty 1

## 2024-02-17 MED ORDER — POLYVINYL ALCOHOL 1.4 % OP SOLN
1.0000 [drp] | Freq: Four times a day (QID) | OPHTHALMIC | Status: DC | PRN
  Filled 2024-02-17: qty 15

## 2024-02-17 MED ORDER — GLYCOPYRROLATE 0.2 MG/ML IJ SOLN
0.1000 mg | Freq: Once | INTRAMUSCULAR | Status: AC
  Administered 2024-02-17: 0.1 mg via INTRAVENOUS
  Filled 2024-02-17: qty 0.5

## 2024-02-17 MED ORDER — SODIUM CHLORIDE 3 % IN NEBU
4.0000 mL | INHALATION_SOLUTION | Freq: Once | RESPIRATORY_TRACT | Status: AC
  Administered 2024-02-17: 4 mL via RESPIRATORY_TRACT
  Filled 2024-02-17: qty 4

## 2024-02-17 MED ORDER — HYDROMORPHONE BOLUS VIA INFUSION: 1.0000 mg | INTRAVENOUS | Status: DC | PRN

## 2024-02-17 MED ORDER — ACETAMINOPHEN 325 MG PO TABS: 650.0000 mg | ORAL_TABLET | Freq: Four times a day (QID) | ORAL | Status: DC | PRN

## 2024-02-17 MED ORDER — HALOPERIDOL LACTATE 5 MG/ML IJ SOLN: 2.5000 mg | INTRAMUSCULAR | Status: DC | PRN

## 2024-02-17 MED ORDER — ACETAMINOPHEN 650 MG RE SUPP: 650.0000 mg | Freq: Four times a day (QID) | RECTAL | Status: DC | PRN

## 2024-02-17 NOTE — IPAL (Signed)
" °  Interdisciplinary Goals of Care Family Meeting   Date carried out: 02/17/2024  Location of the meeting: Bedside  Member's involved: Physician and Family Member or next of kin  Durable Power of Attorney or environmental health practitioner: son, daughter, wife - all family members (3/4)  Discussion: We discussed goals of care for [employer] .  Code status:   Code Status: Do not attempt resuscitation (DNR) - Comfort care   Disposition: Home with Hospice vs Hospice Home  Time spent for the meeting: 35 mins    Cresencio Fairly, MD  02/17/2024, 2:57 PM   "

## 2024-02-17 NOTE — Progress Notes (Signed)
 Yankauer suction setup for respiratory secretions. Instructions given to pt on proper usage.

## 2024-02-17 NOTE — Progress Notes (Signed)
 OT Cancellation Note  Patient Details Name: Victor [name] MRN: [id number] DOB: 02-21-1931   Cancelled Treatment:    Reason Eval/Treat Not Completed: Other (comment). Chart reviewed. Pt is comfort care measures now. Will sign off for OT.   Antonette Hendricks R., MPH, MS, OTR/L ascom [phone] 02/17/24, 4:34 PM

## 2024-02-17 NOTE — Consult Note (Signed)
" °  Consult Note   Patient Name: Victor [name]  DOB: 1931/08/06  MRN: [id number]  Age / Sex: 89 y.o., male   PCP: [name], MD Referring Physician: Maree Hue, MD Admit Date: 02/15/2024 Length of Stay: 2 days  Reason for Consultation/Follow-up: Establishing goals of care   Primary Diagnoses  Present on Admission:  Essential hypertension  Hyperlipidemia associated with type 2 diabetes mellitus (HCC)  BPH (benign prostatic hyperplasia)  Chronic diastolic heart failure (HCC)  Mild dementia (HCC)  Sepsis due to pneumonia [facility])   CODE STATUS: DNR  Subjective:  Chief Complaint/History of Present Illness: Victor [name] is a 89 y.o. male with multiple medical problems including dementia, hyperlipidemia, diabetes, who was admitted to the hospital 02/15/2024 with sepsis from pneumonia.  Palliative care was consulted to address goals.  Social Hx: Patient married lives at home with his wife.  He has a son and daughter who are involved  Review of Systems  Unable to perform ROS   Objective:   Vital Signs:  BP (!) 95/49 (BP Location: Left Arm)   Pulse 81   Temp 98.5 F (36.9 C)   Resp 20   Wt 156 lb 8.4 oz (71 kg)   SpO2 97%   BMI 23.11 kg/m   Physical Exam: General: Ill-appearing Respiratory: Congested, not in respiratory distress Abdomen: not distended Extremities:  Skin: no rashes or lesions on visible skin Neuro: Lethargic   Assessment & Plan:   Assessment: Patient is a 89 year old man with multiple medical problems including dementia, who was admitted to the hospital on 02/15/2024 with sepsis from pneumonia.  After meeting with Dr. Maree, family opted to transition patient to comfort care.  I spoke with patient's wife, son, and daughter.  All confirmed desire for comfort care.  Their goal is to ensure the patient is comfortable until end-of-life.  They are interested in possible transfer to inpatient hospice facility - Hospice Home. Will consult hospice  liaison to coordinate.   Comfort orders reviewed.  Patient is comfortable appearing at present.  Recommendations/Plan: # Complex medical decision making/goals of care: -Comfort care -Hospice IPU -  Code Status: Do not attempt resuscitation (DNR) - Comfort care  Discussed with: Dr. Maree, [facility], [facility] - hospice liaison   Time Total: 30 minutes  Visit consisted of counseling and education dealing with the complex and emotionally intense issues of symptom management and palliative care in the setting of serious and potentially life-threatening illness.Greater than 50%  of this time was spent counseling and coordinating care related to the above assessment and plan.  Signed by: Fonda Mower, PhD, NP-C Palliative Care Provider PMT # [phone]  Thank you for allowing the palliative care team to participate in the care [facility].  "

## 2024-02-17 NOTE — Progress Notes (Signed)
 " PROGRESS NOTE    Victor [name]  FMW:[id number] DOB: [birth year] DOA: 02/15/2024 PCP: [name], MD    Brief Narrative:   89 y.o. year old male with medical history of hypertension, hyperlipidemia, type 2 diabetes, dementia, BPH presented to the ED with coughing and shortness of breath.  Patient states coughing has been present for 1 week but shortness of breath started yesterday.   1/18: family chose comfort care/Hospice   Assessment & Plan:   Principal Problem:   Sepsis due to pneumonia [facility]) Active Problems:   Essential hypertension   Hyperlipidemia associated with type 2 diabetes mellitus (HCC)   Diabetes mellitus type II, controlled (HCC)   BPH (benign prostatic hyperplasia)   CAD S/P percutaneous coronary angioplasty --> PCI RCA Promus DES 2.5 mm x 23 mm   Chronic diastolic heart failure (HCC)   Mild dementia (HCC)   Acute respiratory failure with hypoxia (HCC)   Palliative care encounter   PNA Continue Abx for now.   HTN: HLD:  T2DM:  Dementia BPH/OAB:   Plan for full comfort care/hospice.   DVT prophylaxis: comfort care) heparin  injection 5,000 Units Start: 02/15/24 1545     Code Status: DNR - comfort care Family Communication: updated family at bedside Disposition Plan: possibly tomorrow     Antimicrobials:  Rocephin     Subjective:  Awake, oral secretions, cough & sob +  Objective: Vitals:   02/17/24 0500 02/17/24 0509 02/17/24 0820 02/17/24 1143  BP:   103/60 (!) 95/49  Pulse:   81 81  Resp: (!) 21 18 15 20   Temp:   98 F (36.7 C) 98.5 F (36.9 C)  TempSrc:   Oral Oral  SpO2:   93% 97%  Weight:  71 kg      Intake/Output Summary (Last 24 hours) at 02/17/2024 1910 Last data filed at 02/17/2024 1831 Gross per 24 hour  Intake 1100.76 ml  Output 450 ml  Net 650.76 ml   Filed Weights   02/16/24 0500 02/17/24 0509  Weight: 70.7 kg 71 kg    Examination:  General exam: Appears calm and comfortable, gurgling oral  secretions Respiratory system: Clear to auscultation. Respiratory effort normal. Cardiovascular system: S1 & S2 heard, RRR. No  murmurs. No pedal edema. Gastrointestinal system: Abdomen is soft, benign Central nervous system: Alert and oriented. No focal neurological deficits. Extremities: Symmetric 5 x 5 power. Skin: No rashes, lesions or ulcers Psychiatry: Judgement and insight appear normal. Mood & affect appropriate.     Data Reviewed: I have personally reviewed following labs and imaging studies  CBC: Recent Labs  Lab 02/15/24 1107 02/16/24 0413  WBC 12.0* 8.2  HGB 14.8 12.4*  HCT 45.5 38.2*  MCV 95.0 96.2  PLT 135* 102*   Basic Metabolic Panel: Recent Labs  Lab 02/15/24 1107 02/15/24 2152 02/16/24 0413  NA 138  --  138  K 4.4  --  4.1  CL 102  --  105  CO2 24  --  26  GLUCOSE 153*  --  90  BUN 19  --  18  CREATININE 0.85  --  0.78  CALCIUM  9.7  --  8.7*  MG  --  1.9  --     HbA1C: Recent Labs    02/15/24 2152  HGBA1C 6.8*   CBG: Recent Labs  Lab 02/16/24 0719 02/16/24 1505 02/16/24 2120 02/17/24 0819 02/17/24 1138  GLUCAP 86 225* 118* 141* 179*    Sepsis Labs: Recent Labs  Lab 02/15/24 1247  02/16/24 0413  LATICACIDVEN 2.3* 1.0    Recent Results (from the past 240 hours)  Resp panel by RT-PCR (RSV, Flu A&B, Covid) Anterior Nasal Swab     Status: None   Collection Time: 02/15/24 12:47 PM   Specimen: Anterior Nasal Swab  Result Value Ref Range Status   SARS Coronavirus 2 by RT PCR NEGATIVE NEGATIVE Final    Comment: (NOTE) SARS-CoV-2 target nucleic acids are NOT DETECTED.  The SARS-CoV-2 RNA is generally detectable in upper respiratory specimens during the acute phase of infection. The lowest concentration of SARS-CoV-2 viral copies this assay can detect is 138 copies/mL. A negative result does not preclude SARS-Cov-2 infection and should not be used as the sole basis for treatment or other patient management decisions. A negative  result may occur with  improper specimen collection/handling, submission of specimen other than nasopharyngeal swab, presence of viral mutation(s) within the areas targeted by this assay, and inadequate number of viral copies(<138 copies/mL). A negative result must be combined with clinical observations, patient history, and epidemiological information. The expected result is Negative.  Fact Sheet for Patients:  bloggercourse.com  Fact Sheet for Healthcare Providers:  seriousbroker.it  This test is no t yet approved or cleared by the [place]  FDA and  has been authorized for detection and/or diagnosis of SARS-CoV-2 by FDA under an Emergency Use Authorization (EUA). This EUA will remain  in effect (meaning this test can be used) for the duration of the COVID-19 declaration under Section 564(b)(1) of the Act, 21 U.S.C.section 360bbb-3(b)(1), unless the authorization is terminated  or revoked sooner.       Influenza A by PCR NEGATIVE NEGATIVE Final   Influenza B by PCR NEGATIVE NEGATIVE Final    Comment: (NOTE) The Xpert Xpress SARS-CoV-2/FLU/RSV plus assay is intended as an aid in the diagnosis of influenza from Nasopharyngeal swab specimens and should not be used as a sole basis for treatment. Nasal washings and aspirates are unacceptable for Xpert Xpress SARS-CoV-2/FLU/RSV testing.  Fact Sheet for Patients: bloggercourse.com  Fact Sheet for Healthcare Providers: seriousbroker.it  This test is not yet approved or cleared by the [place]  FDA and has been authorized for detection and/or diagnosis of SARS-CoV-2 by FDA under an Emergency Use Authorization (EUA). This EUA will remain in effect (meaning this test can be used) for the duration of the COVID-19 declaration under Section 564(b)(1) of the Act, 21 U.S.C. section 360bbb-3(b)(1), unless the authorization is  terminated or revoked.     Resp Syncytial Virus by PCR NEGATIVE NEGATIVE Final    Comment: (NOTE) Fact Sheet for Patients: bloggercourse.com  Fact Sheet for Healthcare Providers: seriousbroker.it  This test is not yet approved or cleared by the [place]  FDA and has been authorized for detection and/or diagnosis of SARS-CoV-2 by FDA under an Emergency Use Authorization (EUA). This EUA will remain in effect (meaning this test can be used) for the duration of the COVID-19 declaration under Section 564(b)(1) of the Act, 21 U.S.C. section 360bbb-3(b)(1), unless the authorization is terminated or revoked.  Performed at [facility], [address] [place], [place] [address]   Blood culture (routine x 2)     Status: None (Preliminary result)   Collection Time: 02/15/24 12:47 PM   Specimen: BLOOD RIGHT ARM  Result Value Ref Range Status   Specimen Description BLOOD RIGHT ARM  Final   Special Requests   Final    BOTTLES DRAWN AEROBIC AND ANAEROBIC Blood Culture results may not be optimal due  to an inadequate volume of blood received in culture bottles   Culture   Final    NO GROWTH 2 DAYS Performed at [facility], [address] [place], [place] [address]    Report Status PENDING  Incomplete  Blood culture (routine x 2)     Status: None (Preliminary result)   Collection Time: 02/15/24 12:47 PM   Specimen: BLOOD LEFT ARM  Result Value Ref Range Status   Specimen Description BLOOD LEFT ARM  Final   Special Requests   Final    BOTTLES DRAWN AEROBIC AND ANAEROBIC Blood Culture results may not be optimal due to an inadequate volume of blood received in culture bottles   Culture   Final    NO GROWTH 2 DAYS Performed at [facility], [address], [place], [place] [address]    Report Status PENDING  Incomplete         Radiology Studies: No results found.      Scheduled Meds:   acidophilus  1 capsule Oral Daily   artificial tears  2 drop Both Eyes Daily   aspirin  EC  81 mg Oral Daily   cyanocobalamin   2,500 mcg Oral Daily   dextromethorphan   15 mg Oral TID   donepezil   10 mg Oral Daily   heparin   5,000 Units Subcutaneous Q8H   memantine   5 mg Oral BID   omega-3 acid ethyl esters  2,000 mg Oral BID   sodium chloride  flush  3 mL Intravenous Q12H   trimethoprim -polymyxin b   1 drop Both Eyes BID   Continuous Infusions:  sodium chloride  20 mL/hr at 02/17/24 1819   cefTRIAXone  (ROCEPHIN )  IV 2 g (02/17/24 1821)     LOS: 2 days    Time spent: 35 mins    Juvenal Umar Maree, MD [facility] Pager [phone]  If 7PM-7AM, please contact night-coverage [email or web address]  02/17/2024, 7:10 PM   "

## 2024-02-17 NOTE — Progress Notes (Signed)
 Copious amount of frothy tan sputum that is not easily expelled by pt. However, with some encouragement, pt was able to cough up quit a large amount at this time. HR sustained around 120s ST. Provider Rockie Rams informed of this issue via secure chat.

## 2024-02-18 DIAGNOSIS — F03A0 Unspecified dementia, mild, without behavioral disturbance, psychotic disturbance, mood disturbance, and anxiety: Secondary | ICD-10-CM

## 2024-02-18 DIAGNOSIS — A419 Sepsis, unspecified organism: Secondary | ICD-10-CM | POA: Diagnosis not present

## 2024-02-18 DIAGNOSIS — I5032 Chronic diastolic (congestive) heart failure: Secondary | ICD-10-CM | POA: Diagnosis not present

## 2024-02-18 DIAGNOSIS — J189 Pneumonia, unspecified organism: Secondary | ICD-10-CM | POA: Diagnosis not present

## 2024-02-18 LAB — GLUCOSE, CAPILLARY
Glucose-Capillary: 128 mg/dL — ABNORMAL HIGH (ref 70–99)
Glucose-Capillary: 189 mg/dL — ABNORMAL HIGH (ref 70–99)
Glucose-Capillary: 191 mg/dL — ABNORMAL HIGH (ref 70–99)
Glucose-Capillary: 130 mg/dL — ABNORMAL HIGH (ref 70–99)

## 2024-02-18 MED ORDER — INSULIN ASPART 100 UNIT/ML IJ SOLN
0.0000 [IU] | Freq: Three times a day (TID) | INTRAMUSCULAR | Status: DC
  Administered 2024-02-18: 2 [IU] via SUBCUTANEOUS
  Administered 2024-02-19: 1 [IU] via SUBCUTANEOUS
  Administered 2024-02-19 (×2): 2 [IU] via SUBCUTANEOUS
  Administered 2024-02-20 (×2): 1 [IU] via SUBCUTANEOUS
  Filled 2024-02-18 (×2): qty 2
  Filled 2024-02-18 (×2): qty 1
  Filled 2024-02-18: qty 2
  Filled 2024-02-18: qty 1

## 2024-02-18 MED ORDER — GABAPENTIN 300 MG PO CAPS
300.0000 mg | ORAL_CAPSULE | Freq: Every day | ORAL | Status: DC
  Administered 2024-02-18 – 2024-02-19 (×2): 300 mg via ORAL
  Filled 2024-02-18 (×2): qty 1

## 2024-02-18 MED ORDER — MIRTAZAPINE 15 MG PO TABS
15.0000 mg | ORAL_TABLET | Freq: Every day | ORAL | Status: DC
  Administered 2024-02-18 – 2024-02-19 (×2): 15 mg via ORAL
  Filled 2024-02-18 (×2): qty 1

## 2024-02-18 MED ORDER — ENOXAPARIN SODIUM 40 MG/0.4ML IJ SOSY
40.0000 mg | PREFILLED_SYRINGE | INTRAMUSCULAR | Status: DC
  Administered 2024-02-18 – 2024-02-19 (×2): 40 mg via SUBCUTANEOUS
  Filled 2024-02-18 (×2): qty 0.4

## 2024-02-18 MED ORDER — FESOTERODINE FUMARATE ER 4 MG PO TB24
4.0000 mg | ORAL_TABLET | Freq: Every day | ORAL | Status: DC
  Administered 2024-02-18 – 2024-02-20 (×3): 4 mg via ORAL
  Filled 2024-02-18 (×3): qty 1

## 2024-02-18 MED ORDER — ROSUVASTATIN CALCIUM 20 MG PO TABS
20.0000 mg | ORAL_TABLET | Freq: Every day | ORAL | Status: DC
  Administered 2024-02-18 – 2024-02-20 (×3): 20 mg via ORAL
  Filled 2024-02-18 (×3): qty 1

## 2024-02-18 MED ORDER — INSULIN ASPART 100 UNIT/ML IJ SOLN: 0.0000 [IU] | Freq: Every day | INTRAMUSCULAR | Status: DC

## 2024-02-18 NOTE — Progress Notes (Signed)
 PT Cancellation Note  Patient Details Name: Victor [name] MRN: [id number] DOB: 07/11/1931   Cancelled Treatment:    Reason Eval/Treat Not Completed: Other (comment). Per chart, patient has gone comfort care. Will sign off.    Dorinne Graeff 02/18/2024, 1:31 PM

## 2024-02-18 NOTE — Progress Notes (Addendum)
 " PROGRESS NOTE    Victor [name]  FMW:[id number] DOB: 02-Nov-1931 DOA: 02/15/2024 PCP: [name], MD    Brief Narrative:   89 y.o. year old male with medical history of hypertension, hyperlipidemia, type 2 diabetes, dementia, BPH presented to the ED with coughing and shortness of breath.  Patient states coughing has been present for 1 week but shortness of breath started yesterday.   1/18: family chose comfort care/Hospice 1/19: Family has reversed the comfort care and would like to pursue short-term rehab/SNF instead.  Will have PT and OT reevaluate   Assessment & Plan:   Principal Problem:   Sepsis due to pneumonia [facility]) Active Problems:   Essential hypertension   Hyperlipidemia associated with type 2 diabetes mellitus (HCC)   Diabetes mellitus type II, controlled (HCC)   BPH (benign prostatic hyperplasia)   CAD S/P percutaneous coronary angioplasty --> PCI RCA Promus DES 2.5 mm x 23 mm   Chronic diastolic heart failure (HCC)   Mild dementia (HCC)   Acute respiratory failure with hypoxia (HCC)   Palliative care encounter  Acute on chronic hypoxic respiratory failure likely due to pneumonia Community-acquired pneumonia Continue IV ceftriaxone  for now.  He is currently requiring 5 L oxygen.  Will wean as able   HTN: Controlled HLD: Crestor  20 mg daily T2DM: Hemoglobin A1c 6.8.  Start sliding scale insulin  Dementia: At baseline BPH/OAB: Continue home medications  The initial plan after family meeting yesterday was comfort care based on his progressive decline over last 1 to 2 years.  He has been eating more help with his ADL and family is not able to take care of him at home.   DVT prophylaxis: Lovenox       Code Status: DNR Family Communication: updated family over the phone Disposition Plan: Family seeking SNF/STR.  Await PT and OT reevaluation     Antimicrobials:  Rocephin     Subjective:  Eating breakfast.  Minimal cough when he  eats  Objective: Vitals:   02/17/24 1143 02/18/24 0500 02/18/24 0819 02/18/24 1042  BP: (!) 95/49  124/78 111/72  Pulse: 81  91 91  Resp: 20  20 20   Temp: 98.5 F (36.9 C)  98.1 F (36.7 C) 98.4 F (36.9 C)  TempSrc: Oral     SpO2: 97%  95% 94%  Weight:  71.2 kg      Intake/Output Summary (Last 24 hours) at 02/18/2024 1318 Last data filed at 02/18/2024 1020 Gross per 24 hour  Intake 708.1 ml  Output 1050 ml  Net -341.9 ml   Filed Weights   02/16/24 0500 02/17/24 0509 02/18/24 0500  Weight: 70.7 kg 71 kg 71.2 kg    Examination:  General exam: Appears calm and comfortable, gurgling oral secretions Respiratory system: Clear to auscultation. Respiratory effort normal. Cardiovascular system: S1 & S2 heard, RRR. No  murmurs. No pedal edema. Gastrointestinal system: Abdomen is soft, benign Central nervous system: Alert and oriented. No focal neurological deficits. Extremities: Symmetric 5 x 5 power. Skin: No rashes, lesions or ulcers Psychiatry: Judgement and insight appear normal. Mood & affect appropriate.     Data Reviewed: I have personally reviewed following labs and imaging studies  CBC: Recent Labs  Lab 02/15/24 1107 02/16/24 0413  WBC 12.0* 8.2  HGB 14.8 12.4*  HCT 45.5 38.2*  MCV 95.0 96.2  PLT 135* 102*   Basic Metabolic Panel: Recent Labs  Lab 02/15/24 1107 02/15/24 2152 02/16/24 0413  NA 138  --  138  K 4.4  --  4.1  CL 102  --  105  CO2 24  --  26  GLUCOSE 153*  --  90  BUN 19  --  18  CREATININE 0.85  --  0.78  CALCIUM  9.7  --  8.7*  MG  --  1.9  --     HbA1C: Recent Labs    02/15/24 2152  HGBA1C 6.8*   CBG: Recent Labs  Lab 02/17/24 0819 02/17/24 1138 02/17/24 2314 02/18/24 0826 02/18/24 1215  GLUCAP 141* 179* 145* 128* 189*    Sepsis Labs: Recent Labs  Lab 02/15/24 1247 02/16/24 0413  LATICACIDVEN 2.3* 1.0    Recent Results (from the past 240 hours)  Resp panel by RT-PCR (RSV, Flu A&B, Covid) Anterior Nasal Swab      Status: None   Collection Time: 02/15/24 12:47 PM   Specimen: Anterior Nasal Swab  Result Value Ref Range Status   SARS Coronavirus 2 by RT PCR NEGATIVE NEGATIVE Final    Comment: (NOTE) SARS-CoV-2 target nucleic acids are NOT DETECTED.  The SARS-CoV-2 RNA is generally detectable in upper respiratory specimens during the acute phase of infection. The lowest concentration of SARS-CoV-2 viral copies this assay can detect is 138 copies/mL. A negative result does not preclude SARS-Cov-2 infection and should not be used as the sole basis for treatment or other patient management decisions. A negative result may occur with  improper specimen collection/handling, submission of specimen other than nasopharyngeal swab, presence of viral mutation(s) within the areas targeted by this assay, and inadequate number of viral copies(<138 copies/mL). A negative result must be combined with clinical observations, patient history, and epidemiological information. The expected result is Negative.  Fact Sheet for Patients:  bloggercourse.com  Fact Sheet for Healthcare Providers:  seriousbroker.it  This test is no t yet approved or cleared by the [place]  FDA and  has been authorized for detection and/or diagnosis of SARS-CoV-2 by FDA under an Emergency Use Authorization (EUA). This EUA will remain  in effect (meaning this test can be used) for the duration of the COVID-19 declaration under Section 564(b)(1) of the Act, 21 U.S.C.section 360bbb-3(b)(1), unless the authorization is terminated  or revoked sooner.       Influenza A by PCR NEGATIVE NEGATIVE Final   Influenza B by PCR NEGATIVE NEGATIVE Final    Comment: (NOTE) The Xpert Xpress SARS-CoV-2/FLU/RSV plus assay is intended as an aid in the diagnosis of influenza from Nasopharyngeal swab specimens and should not be used as a sole basis for treatment. Nasal washings and aspirates are  unacceptable for Xpert Xpress SARS-CoV-2/FLU/RSV testing.  Fact Sheet for Patients: bloggercourse.com  Fact Sheet for Healthcare Providers: seriousbroker.it  This test is not yet approved or cleared by the [place]  FDA and has been authorized for detection and/or diagnosis of SARS-CoV-2 by FDA under an Emergency Use Authorization (EUA). This EUA will remain in effect (meaning this test can be used) for the duration of the COVID-19 declaration under Section 564(b)(1) of the Act, 21 U.S.C. section 360bbb-3(b)(1), unless the authorization is terminated or revoked.     Resp Syncytial Virus by PCR NEGATIVE NEGATIVE Final    Comment: (NOTE) Fact Sheet for Patients: bloggercourse.com  Fact Sheet for Healthcare Providers: seriousbroker.it  This test is not yet approved or cleared by the [place]  FDA and has been authorized for detection and/or diagnosis of SARS-CoV-2 by FDA under an Emergency Use Authorization (EUA). This EUA will remain in effect (meaning this test can be used) for the  duration of the COVID-19 declaration under Section 564(b)(1) of the Act, 21 U.S.C. section 360bbb-3(b)(1), unless the authorization is terminated or revoked.  Performed at [facility], [address] [place], [place] [address]   Blood culture (routine x 2)     Status: None (Preliminary result)   Collection Time: 02/15/24 12:47 PM   Specimen: BLOOD RIGHT ARM  Result Value Ref Range Status   Specimen Description BLOOD RIGHT ARM  Final   Special Requests   Final    BOTTLES DRAWN AEROBIC AND ANAEROBIC Blood Culture results may not be optimal due to an inadequate volume of blood received in culture bottles   Culture   Final    NO GROWTH 3 DAYS Performed at [facility], [address] [place], [place] [address]    Report Status PENDING  Incomplete  Blood culture  (routine x 2)     Status: None (Preliminary result)   Collection Time: 02/15/24 12:47 PM   Specimen: BLOOD LEFT ARM  Result Value Ref Range Status   Specimen Description BLOOD LEFT ARM  Final   Special Requests   Final    BOTTLES DRAWN AEROBIC AND ANAEROBIC Blood Culture results may not be optimal due to an inadequate volume of blood received in culture bottles   Culture   Final    NO GROWTH 3 DAYS Performed at [facility], [address] [place], [place] [address]    Report Status PENDING  Incomplete         Radiology Studies: No results found.      Scheduled Meds:  acidophilus  1 capsule Oral Daily   artificial tears  2 drop Both Eyes Daily   aspirin  EC  81 mg Oral Daily   dextromethorphan   15 mg Oral TID   donepezil   10 mg Oral Daily   memantine   5 mg Oral BID   sodium chloride  flush  3 mL Intravenous Q12H   trimethoprim -polymyxin b   1 drop Both Eyes BID   Continuous Infusions:  sodium chloride  20 mL/hr at 02/17/24 1819   cefTRIAXone  (ROCEPHIN )  IV 2 g (02/17/24 1821)     LOS: 3 days    Time spent: 35 mins    Joshwa Hemric Maree, MD [facility] Pager [phone]  If 7PM-7AM, please contact night-coverage [email or web address]  02/18/2024, 1:18 PM   "

## 2024-02-18 NOTE — Progress Notes (Addendum)
" °  [facility] rm [address] [facility] liaison note:    Met with patient and family at the bedside. Patient alert, sitting up in bed. No acute symptom management needs at this time.  Currently not IPU appropriate, but will continue to follow for any changes in his condition.  Please call with any hospice related questions or concerns.  Thank you for the opportunity to participate in this patient's care  [facility] Liaison [phone] "

## 2024-02-18 NOTE — Plan of Care (Signed)
" °  Problem: Fluid Volume: Goal: Hemodynamic stability will improve Outcome: Progressing   Problem: Clinical Measurements: Goal: Diagnostic test results will improve Outcome: Progressing Goal: Signs and symptoms of infection will decrease Outcome: Progressing   Problem: Respiratory: Goal: Ability to maintain adequate ventilation will improve Outcome: Progressing   Problem: Education: Goal: Ability to describe self-care measures that may prevent or decrease complications (Diabetes Survival Skills Education) will improve Outcome: Progressing Goal: Individualized Educational Video(s) Outcome: Progressing   Problem: Coping: Goal: Ability to adjust to condition or change in health will improve Outcome: Progressing   Problem: Fluid Volume: Goal: Ability to maintain a balanced intake and output will improve Outcome: Progressing   Problem: Health Behavior/Discharge Planning: Goal: Ability to identify and utilize available resources and services will improve Outcome: Progressing Goal: Ability to manage health-related needs will improve Outcome: Progressing   Problem: Metabolic: Goal: Ability to maintain appropriate glucose levels will improve Outcome: Progressing   Problem: Nutritional: Goal: Maintenance of adequate nutrition will improve Outcome: Progressing Goal: Progress toward achieving an optimal weight will improve Outcome: Progressing   Problem: Skin Integrity: Goal: Risk for impaired skin integrity will decrease Outcome: Progressing   Problem: Tissue Perfusion: Goal: Adequacy of tissue perfusion will improve Outcome: Progressing   Problem: Education: Goal: Knowledge of General Education information will improve Description: Including pain rating scale, medication(s)/side effects and non-pharmacologic comfort measures Outcome: Progressing   Problem: Health Behavior/Discharge Planning: Goal: Ability to manage health-related needs will improve Outcome:  Progressing   Problem: Clinical Measurements: Goal: Ability to maintain clinical measurements within normal limits will improve Outcome: Progressing Goal: Will remain free from infection Outcome: Progressing Goal: Diagnostic test results will improve Outcome: Progressing Goal: Respiratory complications will improve Outcome: Progressing Goal: Cardiovascular complication will be avoided Outcome: Progressing   Problem: Activity: Goal: Risk for activity intolerance will decrease Outcome: Progressing   Problem: Nutrition: Goal: Adequate nutrition will be maintained Outcome: Progressing   Problem: Coping: Goal: Level of anxiety will decrease Outcome: Progressing   Problem: Elimination: Goal: Will not experience complications related to bowel motility Outcome: Progressing Goal: Will not experience complications related to urinary retention Outcome: Progressing   Problem: Pain Managment: Goal: General experience of comfort will improve and/or be controlled Outcome: Progressing   Problem: Safety: Goal: Ability to remain free from injury will improve Outcome: Progressing   Problem: Skin Integrity: Goal: Risk for impaired skin integrity will decrease Outcome: Progressing   Problem: Education: Goal: Ability to describe self-care measures that may prevent or decrease complications (Diabetes Survival Skills Education) will improve Outcome: Progressing   Problem: Coping: Goal: Ability to adjust to condition or change in health will improve Outcome: Progressing   Problem: Fluid Volume: Goal: Ability to maintain a balanced intake and output will improve Outcome: Progressing   Problem: Health Behavior/Discharge Planning: Goal: Ability to identify and utilize available resources and services will improve Outcome: Progressing Goal: Ability to manage health-related needs will improve Outcome: Progressing   Problem: Metabolic: Goal: Ability to maintain appropriate glucose  levels will improve Outcome: Progressing   Problem: Nutritional: Goal: Maintenance of adequate nutrition will improve Outcome: Progressing Goal: Progress toward achieving an optimal weight will improve Outcome: Progressing   Problem: Skin Integrity: Goal: Risk for impaired skin integrity will decrease Outcome: Progressing   Problem: Tissue Perfusion: Goal: Adequacy of tissue perfusion will improve Outcome: Progressing   "

## 2024-02-18 NOTE — Progress Notes (Signed)
 SPIRITUAL CARE AND COUNSELING CONSULT NOTE   VISIT SUMMARY  Chaplain met and spoke to patient about encroaching EOL.  SPIRITUAL ENCOUNTER                                                                                                                                                                      Type of Visit: Initial Care provided to:: Patient Referral source: Nurse (RN/NT/LPN) Reason for visit: End-of-life OnCall Visit: No   SPIRITUAL FRAMEWORK  Community/Connection: None   GOALS       INTERVENTIONS        INTERVENTION OUTCOMES      SPIRITUAL CARE PLAN        If immediate needs arise,    Avante Carneiro  02/18/2024 2:54 PM

## 2024-02-18 NOTE — Plan of Care (Signed)

## 2024-02-18 NOTE — TOC Initial Note (Signed)
 Transition of Care [facility]) - Initial/Assessment Note    Patient Details  Name: Victor [name] MRN: [id number] Date of Birth: April 28, 1931  Transition of Care [facility]) CM/SW Contact:    Daved JONETTA Hamilton, RN Phone Number: 02/18/2024, 3:52 PM  Clinical Narrative:                  Met with patient, several family members present at bedside, introduced self and explained role. Patient is oriented to Person only so discussion took place with patient's spouse Victor [name], daughter Victor [name], son Victor [name], daughter-in-law Victor [name], and granddaughter Victor [name].   Discussed my understanding of patient/family wishes is for patient to be placed in LTC w/ Hospice. Family all agree that this is the case. Victor [name] explains that patient's needs are no longer able to be met by his spouse so he is not able to return home.  This CM inquired about patient's current insurance and if the patient has any additional policies that cover LTC, the family stated he does not. The secondary Generic Insurance on file is [facility] and it only covers what Medicare doesn't. Discussed with family that Medicare will cover Hospice however, they will not pay for room/board at a facility. Discussed with family having our Financial Navigator reach out to discuss the Medicaid application process. Discussed with family about out of pocket pay which they advised was not an option. Family then requested to know what they were supposed to do now. Discussed with family that once patient is medically ready for discharge the expectation is that they leave the hospital as the hospital is an acute care facility and not for LTC. Discussed with family that if they are unable to provide care at home for the patient, they would need to arrange care which could include paying out of pocket.   Discussed with family that I understand the patient isn't currently a candidate for [facility] IPU and that I can send referrals to other IPU Agencies however, most have the same criteria for  admittance.    Victor [name] requested to know if rehab was a possibility for the patient. Discussed that the patient has been moved to comfort care and PT/OT has signed off meaning they won't be working with the patient moving forward so he would not qualify for short term rehab. The family advised that they have been here with the patient since admission and they don't recall PT/OT working with him. Reviewed patient chart, briefly reviewed PT/OT notes from 02/16/24 that recommends skilled PT/OT to continue to work with patient. Family verbalized they were upset at this information, they verbalized they were not provided with this information before making the decision to move patient to comfort care. The family verbalized that they feel they were pressured into comfort care and that their feedback and wishes were not taken into consideration. The family verbalized they would like the patient re-evaluated by PT/OT and if the recommendations are for STR, they want to pursue that. The family also verbalized they would like a different Attending for the patient. Discussed with family I will discuss with my Leadership to facilitate their requests, the family verbalized understanding and agreement with this plan.   Victor [name] is the primary contact [phone] and Victor [name] is the secondary contact [phone]  This CM discussed case with Leadership and sent notification to Patient Satisfaction Manager. Attending notified of family's wishes. PT/OT requested to re-evaluate patient, they are unable to do so today and will plan to see patient tomorrow.   This CM requested  Financial Navigator to reach out to [place] to discuss LTC Medicaid application/process tomorrow. This CM notified Victor [name], she verbalized understanding.   TOC will await PT/OT recommendations tomorrow for discharge planning.    Expected Discharge Plan: Skilled Nursing Facility     Patient Goals and CMS Choice            Expected Discharge Plan and  Services                                              Prior Living Arrangements/Services     Patient language and need for interpreter reviewed:: Yes Do you feel safe going back to the place where you live?: No   Family states that his spouse can no longer meet his needs at home and that he is falling and unable to care for himself.  Need for Family Participation in Patient Care: Yes (Comment) Care giver support system in place?: No (comment)   Criminal Activity/Legal Involvement Pertinent to Current Situation/Hospitalization: No - Comment as needed  Activities of Daily Living      Permission Sought/Granted Permission sought to share information with : Case Manager, Family Supports    Share Information with NAME: Victor [name] or Victor [name]     Permission granted to share info w Relationship: Daughter and Son  Permission granted to share info w Contact Information: Victor [name] Contact [phone], Victor [name], Secondary Contact [phone]  Emotional Assessment Appearance:: Appears stated age     Orientation: : Oriented to Self Alcohol  / Substance Use: Not Applicable Psych Involvement: No (comment)  Admission diagnosis:  Acute respiratory failure with hypoxia (HCC) [J96.01] Sepsis due to pneumonia (HCC) [J18.9, A41.9] Pneumonia of left lower lobe due to infectious organism [J18.9] Patient Active Problem List   Diagnosis Date Noted   Acute respiratory failure with hypoxia (HCC) 02/17/2024   Palliative care encounter 02/17/2024   Sepsis due to pneumonia (HCC) 02/15/2024   Severe mitral valve regurgitation 06/16/2023   Skipped heart beats 06/14/2021   Type 2 diabetes mellitus with vascular disease (HCC) 03/25/2020   Mood change 03/24/2020   Obesity, diabetes, and hypertension syndrome (HCC) 09/15/2019   Coagulation defect 09/15/2019   Chronic diastolic heart failure (HCC) 07/29/2019   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   Diverticulitis    Low  back pain 05/24/2018   Knee osteoarthritis 01/11/2017   Primary osteoarthritis of both hands 01/11/2017   Fatigue 07/05/2016   Change in bowel habits 07/06/2015   Balance problem 05/02/2015   Other symptoms and signs involving the nervous system 05/02/2015   Mild dementia (HCC) 03/16/2015   Symptomatic PVCs; history of 09/14/2014   Restless leg syndrome 08/19/2013   Loss of memory 07/14/2013   Esophageal reflux 05/06/2013   Allergic rhinitis 02/21/2013   Urticaria 02/21/2013   Essential hypertension 03/21/2011   Hyperlipidemia associated with type 2 diabetes mellitus (HCC) 03/21/2011   Diabetes mellitus type II, controlled (HCC) 03/21/2011   BPH (benign prostatic hyperplasia) 03/21/2011   Severe calcific aortic valve stenosis 08/31/2010   CAD S/P percutaneous coronary angioplasty --> PCI RCA Promus DES 2.5 mm x 23 mm 07/31/2006   Atherosclerotic heart disease of native coronary artery without angina pectoris 07/31/2006   PCP:  [name], MD Pharmacy:   [facility] [address]), [place] - [address] [place] ([place]) [place] [address] Phone:  [phone] Fax: [phone]     Social Drivers of Health (SDOH) Social History: SDOH Screenings   Food Insecurity: No Food Insecurity (02/16/2024)  Housing: Low Risk (02/16/2024)  Transportation Needs: No Transportation Needs (02/16/2024)  Utilities: Not At Risk (02/16/2024)  Financial Resource Strain: Low Risk  (12/26/2023)   Received from [facility]  Social Connections: Unknown (02/16/2024)  Tobacco Use: Medium Risk (02/15/2024)   SDOH Interventions:     Readmission Risk Interventions     No data to display

## 2024-02-18 NOTE — Progress Notes (Signed)
 [facility] rm [address] [facility] liaison note:  Referral received for hospice home, talked with family by phone. Explained services including hospice philosophy and hospice inpatient unit criteria.   As decision was made this afternoon for transition to comfort care family is in agreement for follow up and assessment tomorrow.   Thank you for the opportunity to participate in this patient's plan of care.   Hunter Seip, BSN, RN Hospice hospital liaison [phone]

## 2024-02-19 LAB — GLUCOSE, CAPILLARY
Glucose-Capillary: 123 mg/dL — ABNORMAL HIGH (ref 70–99)
Glucose-Capillary: 130 mg/dL — ABNORMAL HIGH (ref 70–99)
Glucose-Capillary: 143 mg/dL — ABNORMAL HIGH (ref 70–99)
Glucose-Capillary: 162 mg/dL — ABNORMAL HIGH (ref 70–99)
Glucose-Capillary: 168 mg/dL — ABNORMAL HIGH (ref 70–99)
Glucose-Capillary: 185 mg/dL — ABNORMAL HIGH (ref 70–99)

## 2024-02-19 LAB — CBC
HCT: 38.2 % — ABNORMAL LOW (ref 39.0–52.0)
Hemoglobin: 12.7 g/dL — ABNORMAL LOW (ref 13.0–17.0)
MCH: 31 pg (ref 26.0–34.0)
MCHC: 33.2 g/dL (ref 30.0–36.0)
MCV: 93.2 fL (ref 80.0–100.0)
Platelets: 126 K/uL — ABNORMAL LOW (ref 150–400)
RBC: 4.1 MIL/uL — ABNORMAL LOW (ref 4.22–5.81)
RDW: 12.7 % (ref 11.5–15.5)
WBC: 6.3 K/uL (ref 4.0–10.5)
nRBC: 0 % (ref 0.0–0.2)

## 2024-02-19 LAB — BASIC METABOLIC PANEL WITH GFR
Anion gap: 9 (ref 5–15)
BUN: 17 mg/dL (ref 8–23)
CO2: 28 mmol/L (ref 22–32)
Calcium: 8.6 mg/dL — ABNORMAL LOW (ref 8.9–10.3)
Chloride: 103 mmol/L (ref 98–111)
Creatinine, Ser: 0.69 mg/dL (ref 0.61–1.24)
GFR, Estimated: >60 mL/min
Glucose, Bld: 133 mg/dL — ABNORMAL HIGH (ref 70–99)
Potassium: 3.9 mmol/L (ref 3.5–5.1)
Sodium: 140 mmol/L (ref 135–145)

## 2024-02-19 NOTE — Care Management Important Message (Signed)
 Important Message  Patient Details  Name: Victor [name] MRN: [id number] Date of Birth: 06-Mar-1931   Important Message Given:  Yes - Medicare IM     [name], CMA 02/19/2024, 11:50 AM

## 2024-02-19 NOTE — Plan of Care (Signed)
  Problem: Education: Goal: Ability to describe self-care measures that may prevent or decrease complications (Diabetes Survival Skills Education) will improve Outcome: Progressing   Problem: Coping: Goal: Ability to adjust to condition or change in health will improve Outcome: Progressing   Problem: Nutritional: Goal: Maintenance of adequate nutrition will improve Outcome: Progressing   Problem: Skin Integrity: Goal: Risk for impaired skin integrity will decrease Outcome: Progressing   Problem: Pain Managment: Goal: General experience of comfort will improve and/or be controlled Outcome: Progressing   Problem: Safety: Goal: Ability to remain free from injury will improve Outcome: Progressing

## 2024-02-19 NOTE — Evaluation (Signed)
 Physical Therapy Evaluation Patient Details Name: Victor [name] MRN: [id number] DOB: 1931/02/07 Today's Date: 02/19/2024  History of Present Illness  Victor [name] is a 89 y.o. year old male with medical history of hypertension, hyperlipidemia, type 2 diabetes, dementia, BPH presented to the ED with coughing and shortness of breath.  Clinical Impression  Patient received in bed, he is alert, agrees to PT. Patient requires mod A for bed mobility and sit to stand and to take some pivoting steps to chair. Patient is fatigued with this. He will continue to benefit from skilled PT to improve strength and functional independence.           If plan is discharge home, recommend the following: A lot of help with walking and/or transfers;A lot of help with bathing/dressing/bathroom;Assist for transportation   Can travel by private vehicle   No    Equipment Recommendations Other (comment) (TBD)  Recommendations for Other Services       Functional Status Assessment Patient has had a recent decline in their functional status and/or demonstrates limited ability to make significant improvements in function in a reasonable and predictable amount of time     Precautions / Restrictions Precautions Precautions: Fall Recall of Precautions/Restrictions: Impaired Restrictions Weight Bearing Restrictions Per Provider Order: No      Mobility  Bed Mobility Overal bed mobility: Needs Assistance Bed Mobility: Supine to Sit   Sidelying to sit: Mod assist       General bed mobility comments: patient requires mod A for all aspects of bed mobility. Poor sitting balance with posterior leaning. Unable to self correct    Transfers Overall transfer level: Needs assistance Equipment used: Rolling walker (2 wheels) Transfers: Sit to/from Stand, Bed to chair/wheelchair/BSC Sit to Stand: Mod assist   Step pivot transfers: Mod assist       General transfer comment: lifting A, max cues for sequencing  transfer, fatigued from SPT    Ambulation/Gait                  Stairs            Wheelchair Mobility     Tilt Bed    Modified Rankin (Stroke Patients Only)       Balance Overall balance assessment: Needs assistance Sitting-balance support: Feet supported, Bilateral upper extremity supported Sitting balance-Leahy Scale: Poor Sitting balance - Comments: limited endurance for sitting unsuppported Postural control: Posterior lean Standing balance support: Bilateral upper extremity supported, During functional activity, Reliant on assistive device for balance Standing balance-Leahy Scale: Poor                               Pertinent Vitals/Pain Pain Assessment Pain Assessment: No/denies pain    Home Living Family/patient expects to be discharged to:: Private residence Living Arrangements: Spouse/significant other Available Help at Discharge: Family;Available 24 hours/day Type of Home: Apartment Home Access: Level entry Entrance Stairs-Rails: Left;Right     Home Layout: One level Home Equipment: Tub bench;Cane - Programmer, [employer] (2 wheels);Wheelchair - manual Additional Comments: daughter and a son nearby    Prior Function Prior Level of Function : Independent/Modified Independent;History of Falls (last six months)             Mobility Comments: MOD I with QC use community and household distances, does not drive, 1 recent fall ADLs Comments: Wife assists with ADLs, min to moderate assist at times.  Supervision with bathing especially to get  in and out of tub/shower     Extremity/Trunk Assessment   Upper Extremity Assessment Upper Extremity Assessment: Generalized weakness    Lower Extremity Assessment Lower Extremity Assessment: Generalized weakness    Cervical / Trunk Assessment Cervical / Trunk Assessment: Normal  Communication   Communication Communication: Impaired Factors Affecting Communication: Hearing impaired     Cognition Arousal: Alert Behavior During Therapy: WFL for tasks assessed/performed   PT - Cognitive impairments: History of cognitive impairments, Orientation   Orientation impairments: Time, Situation                     Following commands: Intact       Cueing Cueing Techniques: Verbal cues     General Comments      Exercises     Assessment/Plan    PT Assessment Patient needs continued PT services  PT Problem List Decreased strength;Decreased activity tolerance;Decreased balance;Decreased mobility;Decreased cognition;Cardiopulmonary status limiting activity;Decreased safety awareness       PT Treatment Interventions DME instruction;Gait training;Functional mobility training;Therapeutic activities;Therapeutic exercise;Balance training;Cognitive remediation;Patient/family education    PT Goals (Current goals can be found in the Care Plan section)  Acute Rehab PT Goals Patient Stated Goal: none stated PT Goal Formulation: With patient Time For Goal Achievement: 03/04/24    Frequency Min 2X/week     Co-evaluation PT/OT/SLP Co-Evaluation/Treatment: Yes Reason for Co-Treatment: Necessary to address cognition/behavior during functional activity;For patient/therapist safety;To address functional/ADL transfers PT goals addressed during session: Mobility/safety with mobility;Balance;Proper use of DME OT goals addressed during session: ADL's and self-care       AM-PAC PT 6 Clicks Mobility  Outcome Measure Help needed turning from your back to your side while in a flat bed without using bedrails?: A Lot Help needed moving from lying on your back to sitting on the side of a flat bed without using bedrails?: A Lot Help needed moving to and from a bed to a chair (including a wheelchair)?: A Lot Help needed standing up from a chair using your arms (e.g., wheelchair or bedside chair)?: A Lot Help needed to walk in hospital room?: A Lot Help needed climbing 3-5 steps  with a railing? : A Lot 6 Click Score: 12    End of Session Equipment Utilized During Treatment: Gait belt Activity Tolerance: Patient limited by fatigue Patient left: in chair;with chair alarm set;with call bell/phone within reach Nurse Communication: Mobility status PT Visit Diagnosis: Muscle weakness (generalized) (M62.81);Other abnormalities of gait and mobility (R26.89);Difficulty in walking, not elsewhere classified (R26.2);Repeated falls (R29.6)    Time: 9090-9077 PT Time Calculation (min) (ACUTE ONLY): 13 min   Charges:   PT Evaluation $PT Eval Low Complexity: 1 Low   PT General Charges $$ ACUTE PT VISIT: 1 Visit         Nazifa Trinka, PT, GCS 02/19/24,10:44 AM

## 2024-02-19 NOTE — TOC Progression Note (Signed)
 Transition of Care [facility]) - Progression Note    Patient Details  Name: Victor [name] MRN: [id number] Date of Birth: 1931/04/03  Transition of Care [facility]) CM/SW Contact  Nathanael [name], RN Phone Number: 02/19/2024, 11:51 AM  Clinical Narrative:    CM spoke with family and they would like [facility]. Called and spoke with Adrien over at [facility] they can offer a bed and they can accept him tomorrow.   Expected Discharge Plan: Skilled Nursing Facility Barriers to Discharge: SNF Pending bed offer               Expected Discharge Plan and Services   Discharge Planning Services: CM Consult Post Acute Care Choice: Skilled Nursing Facility Living arrangements for the past 2 months: Apartment                                       Social Drivers of Health (SDOH) Interventions SDOH Screenings   Food Insecurity: No Food Insecurity (02/16/2024)  Housing: Low Risk (02/16/2024)  Transportation Needs: No Transportation Needs (02/16/2024)  Utilities: Not At Risk (02/16/2024)  Financial Resource Strain: Low Risk  (12/26/2023)   Received from [facility]  Social Connections: Unknown (02/16/2024)  Tobacco Use: Medium Risk (02/15/2024)    Readmission Risk Interventions     No data to display

## 2024-02-19 NOTE — Progress Notes (Addendum)
" °  Progress Note   Patient: Victor [name] FMW:[id number] DOB: 1931/08/14 DOA: 02/15/2024     4 DOS: the patient was seen and examined on 02/19/2024   Brief hospital course:  89 y.o. year old male with medical history of hypertension, hyperlipidemia, type 2 diabetes, dementia, BPH presented to the ED with coughing and shortness of breath.  Patient states coughing has been present for 1 week but shortness of breath started yesterday.    1/18: family chose comfort care/Hospice 1/19: Family has reversed the comfort care and would like to pursue short-term rehab/SNF instead.  Will have PT and OT reevaluate 1/20: Reevaluated by PT and they recommend SNF for subacute rehab      Assessment and Plan:  Principal Problem:   Sepsis due to pneumonia [facility]) Active Problems:   Essential hypertension   Hyperlipidemia associated with type 2 diabetes mellitus (HCC)   Diabetes mellitus type II, controlled (HCC)   BPH (benign prostatic hyperplasia)   CAD S/P percutaneous coronary angioplasty --> PCI RCA Promus DES 2.5 mm x 23 mm   Chronic diastolic heart failure (HCC)   Mild dementia (HCC)   Acute respiratory failure with hypoxia (HCC)   Palliative care encounter   Acute on chronic hypoxic respiratory failure likely due to pneumonia Community-acquired pneumonia Continue IV ceftriaxone  for to complete a 5-day course of therapy.   He is currently requiring 5 L oxygen, to maintain pulse oximetry greater than 92%.   Will attempt to wean off oxygen as tolerated   HTN:  Controlled  T2DM:  Hemoglobin A1c 6.8.   Maintain consistent carbohydrate diet Continue sliding scale insulin    Dementia:  At baseline.  Continue Namenda , Remeron  and donepezil    Will need palliative care follow-up upon discharge     Subjective: Sitting up in her chair..  Sleeping but arouses easily.  Oriented to person  Physical Exam: Vitals:   02/18/24 2023 02/19/24 0446 02/19/24 0500 02/19/24 1034  BP: 106/68 99/73     Pulse: 87 98    Resp: 18 18    Temp: 97.9 F (36.6 C) (!) 97.4 F (36.3 C)    TempSrc: Oral Oral    SpO2: 95% 95%  95%  Weight:   71.3 kg    General exam: Appears calm and comfortable.  Sitting up in a chair Respiratory system: Clear to auscultation. Respiratory effort normal. Cardiovascular system: S1 & S2 heard, RRR. No  murmurs. No pedal edema. Gastrointestinal system: Abdomen is soft, benign Central nervous system: Alert and oriented. No focal neurological deficits. Extremities: Symmetric 5 x 5 power. Skin: No rashes, lesions or ulcers Psychiatry: Judgement and insight appear normal. Mood & affect appropriate.    Data Reviewed:  Labs reviewed  Family Communication: None  Disposition: Status is: Inpatient Remains inpatient appropriate because: Awaiting discharge  Planned Discharge Destination: Skilled nursing facility    Time spent: 35 minutes  Author: Aimee Somerset, MD 02/19/2024 11:18 AM  For on call review [email or web address].  "

## 2024-02-19 NOTE — TOC Progression Note (Signed)
 Transition of Care [facility]) - Progression Note    Patient Details  Name: Victor [name] MRN: [id number] Date of Birth: December 11, 1931  Transition of Care [facility]) CM/SW Contact  Nathanael [name], RN Phone Number: 02/19/2024, 11:46 AM  Clinical Narrative:    SNF workup started and bed search sent out.  Therapies are recommending SNF.     Expected Discharge Plan: Skilled Nursing Facility Barriers to Discharge: SNF Pending bed offer               Expected Discharge Plan and Services   Discharge Planning Services: CM Consult Post Acute Care Choice: Skilled Nursing Facility Living arrangements for the past 2 months: Apartment                                       Social Drivers of Health (SDOH) Interventions SDOH Screenings   Food Insecurity: No Food Insecurity (02/16/2024)  Housing: Low Risk (02/16/2024)  Transportation Needs: No Transportation Needs (02/16/2024)  Utilities: Not At Risk (02/16/2024)  Financial Resource Strain: Low Risk  (12/26/2023)   Received from [facility]  Social Connections: Unknown (02/16/2024)  Tobacco Use: Medium Risk (02/15/2024)    Readmission Risk Interventions     No data to display

## 2024-02-19 NOTE — Plan of Care (Signed)
" °  Problem: Clinical Measurements: Goal: Diagnostic test results will improve Outcome: Progressing Goal: Signs and symptoms of infection will decrease Outcome: Progressing   Problem: Respiratory: Goal: Ability to maintain adequate ventilation will improve Outcome: Progressing   Problem: Fluid Volume: Goal: Ability to maintain a balanced intake and output will improve Outcome: Progressing   "

## 2024-02-19 NOTE — Evaluation (Signed)
 Occupational Therapy Evaluation Patient Details Name: Victor [name] MRN: [id number] DOB: Oct 04, 1931 Today's Date: 02/19/2024   History of Present Illness   Victor [name] is a 89 y.o. year old male with medical history of hypertension, hyperlipidemia, type 2 diabetes, dementia, BPH presented to the ED with coughing and shortness of breath.     Clinical Impressions Patient was seen for OT/PT evaluation this date. Patient and familiy had initially opted for comfort care but now family wants to pursue SNF so PT/OT reconsulted. Patient presented bed level, agreeable to therapy, oriented to self/place. Patient performed bed mobility with mod A, required steadying A while sitting EOB due to frequent posterior LOB; patient performed SPT from EOB to recliner with mod A x 2 using r/w. On 5L, patient 93% HR 112.   Patient presents with deficits in sitting balance, activity tolerance and safety awareness, affecting safe and optimal ADL completion. Patient is currently requiring max A for ADLs.  Paient would benefit from skilled OT services to address noted impairments and functional limitations (see below for any additional details) in order to maximize safety and independence while minimizing future risk of falls, injury, and readmission. Anticipate the need for follow up OT services upon acute hospital DC.      If plan is discharge home, recommend the following:   A lot of help with walking and/or transfers;A lot of help with bathing/dressing/bathroom;Assistance with cooking/housework;Direct supervision/assist for medications management;Assist for transportation;Direct supervision/assist for financial management;Supervision due to cognitive status     Functional Status Assessment   Patient has had a recent decline in their functional status and demonstrates the ability to make significant improvements in function in a reasonable and predictable amount of time.     Equipment Recommendations    None recommended by OT     Recommendations for Other Services         Precautions/Restrictions   Precautions Precautions: Fall Recall of Precautions/Restrictions: Impaired Restrictions Weight Bearing Restrictions Per Provider Order: No     Mobility Bed Mobility Overal bed mobility: Needs Assistance Bed Mobility: Supine to Sit     Supine to sit: Mod assist     General bed mobility comments: initiated all aspects of bed mobiliy, A to move BLE all the way off bed and mod A to lift trunk to come upright    Transfers Overall transfer level: Needs assistance Equipment used: Rolling walker (2 wheels) Transfers: Bed to chair/wheelchair/BSC       Step pivot transfers: Mod assist     General transfer comment: lifting A, max cues for sequencing transfer, fatigued from SPT      Balance Overall balance assessment: Needs assistance Sitting-balance support: Feet supported, Bilateral upper extremity supported Sitting balance-Leahy Scale: Fair Sitting balance - Comments: limited endurance for sitting unsuppported Postural control: Posterior lean Standing balance support: Bilateral upper extremity supported, No upper extremity supported, Reliant on assistive device for balance Standing balance-Leahy Scale: Poor                             ADL either performed or assessed with clinical judgement   ADL Overall ADL's : Needs assistance/impaired                                       General ADL Comments: Pt requiring increased assistance with self care tasks compared to home due  to fatigue/gross weakness     Vision         Perception         Praxis         Pertinent Vitals/Pain Pain Assessment Pain Assessment: No/denies pain     Extremity/Trunk Assessment Upper Extremity Assessment Upper Extremity Assessment: Overall WFL for tasks assessed           Communication Communication Communication: Impaired Factors Affecting  Communication: Hearing impaired   Cognition Arousal: Alert Behavior During Therapy: WFL for tasks assessed/performed Cognition: History of cognitive impairments             OT - Cognition Comments: disoriented to situation/time                 Following commands: Intact       Cueing  General Comments   Cueing Techniques: Verbal cues      Exercises     Shoulder Instructions      Home Living Family/patient expects to be discharged to:: Private residence Living Arrangements: Spouse/significant other Available Help at Discharge: Family;Available 24 hours/day Type of Home: Apartment Home Access: Level entry   Entrance Stairs-Rails: Left;Right Home Layout: One level     Bathroom Shower/Tub: Chief Strategy Officer: Handicapped height Bathroom Accessibility: Yes How Accessible: Accessible via walker Home Equipment: Tub bench;Cane - Programmer, [employer] (2 wheels);Wheelchair - manual   Additional Comments: daughter and a son nearby      Prior Functioning/Environment Prior Level of Function : Independent/Modified Independent;History of Falls (last six months)             Mobility Comments: MOD I with QC use community and household distances, does not drive, 1 recent fall ADLs Comments: Wife assists with ADLs, min to moderate assist at times.  Supervision with bathing especially to get in and out of tub/shower    OT Problem List: Decreased strength;Decreased activity tolerance;Decreased cognition;Decreased knowledge of use of DME or AE;Impaired balance (sitting and/or standing)   OT Treatment/Interventions: Self-care/ADL training;Therapeutic exercise;Energy conservation;DME and/or AE instruction;Therapeutic activities;Patient/family education;Balance training      OT Goals(Current goals can be found in the care plan section)   Acute Rehab OT Goals Patient Stated Goal: to get stronger OT Goal Formulation: With patient Time For Goal  Achievement: 03/04/24 Potential to Achieve Goals: Good   OT Frequency:  Min 2X/week    Co-evaluation PT/OT/SLP Co-Evaluation/Treatment: Yes Reason for Co-Treatment: Necessary to address cognition/behavior during functional activity;For patient/therapist safety;To address functional/ADL transfers PT goals addressed during session: Mobility/safety with mobility;Balance OT goals addressed during session: ADL's and self-care      AM-PAC OT 6 Clicks Daily Activity     Outcome Measure Help from another person eating meals?: None Help from another person taking care of personal grooming?: A Little Help from another person toileting, which includes using toliet, bedpan, or urinal?: A Lot Help from another person bathing (including washing, rinsing, drying)?: A Lot Help from another person to put on and taking off regular upper body clothing?: A Little Help from another person to put on and taking off regular lower body clothing?: A Lot 6 Click Score: 16   End of Session Equipment Utilized During Treatment: Rolling walker (2 wheels) Nurse Communication: Mobility status  Activity Tolerance: Patient tolerated treatment well;Patient limited by fatigue Patient left: in chair;with call bell/phone within reach;with chair alarm set  OT Visit Diagnosis: Unsteadiness on feet (R26.81);Repeated falls (R29.6);Muscle weakness (generalized) (M62.81);History of falling (Z91.81)  Time: 9090-9077 OT Time Calculation (min): 13 min Charges:  OT General Charges $OT Visit: 1 Visit OT Evaluation $OT Eval Low Complexity: 1 Low  Rogers Clause, OT/L MSOT, 02/19/2024

## 2024-02-19 NOTE — NC FL2 (Signed)
 " [place]  MEDICAID FL2 LEVEL OF CARE FORM     IDENTIFICATION  Patient Name: Victor [name] Birthdate: 23-Jul-1931 Sex: male Admission Date (Current Location): 02/15/2024  [facility] and [place] Number:  Chiropodist and Address:  [facility], [address], [place], [place] [address]      Provider Number: [id number]  Attending Physician Name and Address:  Lanetta Lingo, MD  Relative Name and Phone Number:  kirklin mcduffee- son- [phone]    Current Level of Care: Hospital Recommended Level of Care: Skilled Nursing Facility Prior Approval Number:    Date Approved/Denied:   PASRR Number: [id number]  Discharge Plan: SNF    Current Diagnoses: Patient Active Problem List   Diagnosis Date Noted   Acute respiratory failure with hypoxia (HCC) 02/17/2024   Palliative care encounter 02/17/2024   Sepsis due to pneumonia (HCC) 02/15/2024   Severe mitral valve regurgitation 06/16/2023   Skipped heart beats 06/14/2021   Type 2 diabetes mellitus with vascular disease (HCC) 03/25/2020   Mood change 03/24/2020   Obesity, diabetes, and hypertension syndrome (HCC) 09/15/2019   Coagulation defect 09/15/2019   Chronic diastolic heart failure (HCC) 07/29/2019   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   Diverticulitis    Low back pain 05/24/2018   Knee osteoarthritis 01/11/2017   Primary osteoarthritis of both hands 01/11/2017   Fatigue 07/05/2016   Change in bowel habits 07/06/2015   Balance problem 05/02/2015   Other symptoms and signs involving the nervous system 05/02/2015   Mild dementia (HCC) 03/16/2015   Symptomatic PVCs; history of 09/14/2014   Restless leg syndrome 08/19/2013   Loss of memory 07/14/2013   Esophageal reflux 05/06/2013   Allergic rhinitis 02/21/2013   Urticaria 02/21/2013   Essential hypertension 03/21/2011   Hyperlipidemia associated with type 2 diabetes mellitus (HCC) 03/21/2011   Diabetes mellitus  type II, controlled (HCC) 03/21/2011   BPH (benign prostatic hyperplasia) 03/21/2011   Severe calcific aortic valve stenosis 08/31/2010   CAD S/P percutaneous coronary angioplasty --> PCI RCA Promus DES 2.5 mm x 23 mm 07/31/2006   Atherosclerotic heart disease of native coronary artery without angina pectoris 07/31/2006    Orientation RESPIRATION BLADDER Height & Weight     Self, Place  O2 ([place] 5L) Continent Weight: 71.3 kg Height:     BEHAVIORAL SYMPTOMS/MOOD NEUROLOGICAL BOWEL NUTRITION STATUS      Continent Diet (carb modified)  AMBULATORY STATUS COMMUNICATION OF NEEDS Skin   Extensive Assist Verbally Other (Comment) (blister to buttocks covered with bordered foam)                       Personal Care Assistance Level of Assistance  Bathing, Feeding, Dressing Bathing Assistance: Maximum assistance Feeding assistance: Limited assistance Dressing Assistance: Maximum assistance     Functional Limitations Info             SPECIAL CARE FACTORS FREQUENCY  PT (By licensed PT), OT (By licensed OT)     PT Frequency: 5 days per week OT Frequency: 5 days per week            Contractures Contractures Info: Not present    Additional Factors Info  Code Status, Allergies Code Status Info: DNR Allergies Info: Azithromycin High Allergy Rash, Anaphylaxis   Cephalosporins High  Anaphylaxis, Other (See Comments)   Codeine High Allergy Anaphylaxis, Itching, Rash   Doxycycline High  Anaphylaxis   Phenylephrine -guaifenesin Not Specified   Unknown reaction  Pseudoephedrine Not  Specified  Other (See Comments) unknown  Amoxicillin Low  Rash   Levofloxacin  Low  Rash   Penicillins Low  Rash Other reaction(s): Unknown  Sulfa Antibiotics Low  Rash   Sulfasalazine Low  Rash           Current Medications (02/19/2024):  This is the current hospital active medication list Current Facility-Administered Medications  Medication Dose Route Frequency Provider Last Rate Last Admin    acetaminophen  (TYLENOL ) tablet 650 mg  650 mg Oral Q6H PRN Maree Hue, MD       Or   acetaminophen  (TYLENOL ) suppository 650 mg  650 mg Rectal Q6H PRN Maree Hue, MD       acidophilus (RISAQUAD) capsule 1 capsule  1 capsule Oral Daily Patel, Sona, MD   1 capsule at 02/19/24 9157   aspirin  EC tablet 81 mg  81 mg Oral Daily Patel, Sona, MD   81 mg at 02/19/24 9157   benzonatate  (TESSALON ) capsule 200 mg  200 mg Oral TID PRN Patel, Sona, MD       cefTRIAXone  (ROCEPHIN ) 2 g in sodium chloride  0.9 % 100 mL IVPB  2 g Intravenous Q24H Khan, Ghalib, MD 200 mL/hr at 02/18/24 1719 2 g at 02/18/24 1719   dextromethorphan  (DELSYM ) 30 MG/5ML liquid 15 mg  15 mg Oral TID Patel, Sona, MD   15 mg at 02/19/24 0841   donepezil  (ARICEPT ) tablet 10 mg  10 mg Oral Daily Patel, Sona, MD   10 mg at 02/19/24 9157   enoxaparin  (LOVENOX ) injection 40 mg  40 mg Subcutaneous Q24H Shah, Vipul, MD   40 mg at 02/18/24 1716   fesoterodine  (TOVIAZ ) tablet 4 mg  4 mg Oral Daily Shah, Vipul, MD   4 mg at 02/19/24 9157   gabapentin  (NEURONTIN ) capsule 300 mg  300 mg Oral QHS Shah, Vipul, MD   300 mg at 02/18/24 2225   insulin  aspart (novoLOG ) injection 0-5 Units  0-5 Units Subcutaneous QHS Maree Hue, MD       insulin  aspart (novoLOG ) injection 0-9 Units  0-9 Units Subcutaneous TID WC Shah, Vipul, MD   1 Units at 02/19/24 9157   memantine  (NAMENDA ) tablet 5 mg  5 mg Oral BID Patel, Sona, MD   5 mg at 02/19/24 9157   mirtazapine  (REMERON ) tablet 15 mg  15 mg Oral QHS Shah, Vipul, MD   15 mg at 02/18/24 2225   rosuvastatin  (CRESTOR ) tablet 20 mg  20 mg Oral Daily Shah, Vipul, MD   20 mg at 02/19/24 9157   senna-docusate (Senokot-S) tablet 1 tablet  1 tablet Oral QHS PRN Fernand Prost, MD       sodium chloride  flush (NS) 0.9 % injection 3 mL  3 mL Intravenous Q12H Khan, Ghalib, MD   3 mL at 02/19/24 9156   trimethoprim -polymyxin b  (POLYTRIM ) ophthalmic solution 1 drop  1 drop Both Eyes BID Patel, Sona, MD   1 drop at 02/19/24 9156      Discharge Medications: Please see discharge summary for a list of discharge medications.  Relevant Imaging Results:  Relevant Lab Results:   Additional Information DD#[id number]  Victor [name], RN     "

## 2024-02-20 LAB — CULTURE, BLOOD (ROUTINE X 2)
Culture: NO GROWTH
Culture: NO GROWTH

## 2024-02-20 LAB — GLUCOSE, CAPILLARY
Glucose-Capillary: 130 mg/dL — ABNORMAL HIGH (ref 70–99)
Glucose-Capillary: 132 mg/dL — ABNORMAL HIGH (ref 70–99)
Glucose-Capillary: 171 mg/dL — ABNORMAL HIGH (ref 70–99)

## 2024-02-20 MED ORDER — SENNOSIDES-DOCUSATE SODIUM 8.6-50 MG PO TABS: 1.0000 | ORAL_TABLET | Freq: Every evening | ORAL | Status: AC | PRN

## 2024-02-20 MED ORDER — ROSUVASTATIN CALCIUM 20 MG PO TABS: 20.0000 mg | ORAL_TABLET | Freq: Every day | ORAL | Status: AC

## 2024-02-20 MED ORDER — DEXTROMETHORPHAN POLISTIREX ER 30 MG/5ML PO SUER: 15.0000 mg | Freq: Three times a day (TID) | ORAL | Status: AC | PRN

## 2024-02-20 NOTE — TOC Transition Note (Signed)
 Transition of Care [facility]) - Discharge Note   Patient Details  Name: Victor [name] MRN: [id number] Date of Birth: [birth year]  [facility] CM/SW Contact:  Nathanael [name], RN Phone Number: 02/20/2024, 10:37 AM   Clinical Narrative:    Patient is medically cleared for discharge to [facility].  Patient will go to room 302B, bedside RN has called report to [phone].  [facility] has been arranged for transport and there are 6 transports ahead of him.  Called and updated son on discharge, he verbalized understanding, no questions at this time.    Final next level of care: Skilled Nursing Facility Barriers to Discharge: Barriers Resolved   Patient Goals and CMS Choice   CMS Medicare.gov Compare Post Acute Care list provided to:: Patient Represenative (must comment) Choice offered to / list presented to : Adult Children      Discharge Placement PASRR number recieved: 02/20/24            Patient chooses bed at: [facility] Patient to be transferred to facility by: [facility]ona Name of family member notified: Add Dinapoli Patient and family notified of of transfer: 02/20/24  Discharge Plan and Services Additional resources added to the After Visit Summary for     Discharge Planning Services: CM Consult Post Acute Care Choice: Skilled Nursing Facility                               Social Drivers of Health (SDOH) Interventions SDOH Screenings   Food Insecurity: No Food Insecurity (02/16/2024)  Housing: Low Risk (02/16/2024)  Transportation Needs: No Transportation Needs (02/16/2024)  Utilities: Not At Risk (02/16/2024)  Financial Resource Strain: Low Risk  (12/26/2023)   Received from [facility]  Social Connections: Unknown (02/16/2024)  Tobacco Use: Medium Risk (02/15/2024)     Readmission Risk Interventions     No data to display

## 2024-02-20 NOTE — Hospital Course (Addendum)
 Hospital course / significant events:   89 y.o. year old male with medical history of hypertension, hyperlipidemia, type 2 diabetes, dementia, BPH presented to the ED with coughing and shortness of breath.  Patient states coughing has been present for 1 week but shortness of breath started day prior to coming into the ED.   01/16: admitted to hospitalist service w/ acute resp fail and sepsis d/t PNA 01/17: continue tx  01/18: family chose comfort care/Hospice 01/19: Family has reversed the comfort care and would like to pursue short-term rehab/SNF instead.  Will have PT and OT reevaluate 01/20: Reevaluated by PT and they recommend SNF for subacute rehab 01/21: stable for discharge      Consultants:  Palliative care   Procedures/Surgeries: none      ASSESSMENT & PLAN:   Acute on chronic hypoxic respiratory failure likely due to pneumonia Community-acquired pneumonia S/p IV ceftriaxone  5-day course of therapy.   He is currently requiring 4-5 L oxygen, to maintain pulse oximetry greater than 92%.   Wean down/off O2 as able at rehab    HTN:  Controlled   T2DM:  Hemoglobin A1c 6.8.   Maintain consistent carbohydrate diet Continue sliding scale insulin    Dementia:  At baseline.  Continue Namenda , Remeron  and donepezil       No concerns based on BMI: Body mass index is 23.22 kg/m.SABRA Significantly low or high BMI is associated with higher medical risk.  Underweight - under 18  overweight - 25 to 29 obese - 30 or more Class 1 obesity: BMI of 30.0 to 34 Class 2 obesity: BMI of 35.0 to 39 Class 3 obesity: BMI of 40.0 to 49 Super Morbid Obesity: BMI 50-59 Super-super Morbid Obesity: BMI 60+ Healthy nutrition and physical activity advised as adjunct to other disease management and risk reduction treatments    DVT prophylaxis: *** IV fluids: *** continuous IV fluids  Nutrition: *** Central lines / other devices: ***  Code Status: *** ACP documentation reviewed: ***  none on file in [facility]  TOC needs: *** Medical barriers to dispo: ***. Expected medical readiness for discharge ***.

## 2024-02-20 NOTE — Discharge Summary (Signed)
 "    Physician Discharge Summary   Patient: Victor [name] MRN: [id number]  DOB: 02-04-1931   Admit:     Date of Admission: 02/15/2024 Admitted from: home   Discharge: Date of discharge: 02/20/24 Disposition: Skilled nursing facility Condition at discharge: fair  CODE STATUS: DNR      Discharge Physician: Laneta Blunt, DO [facility]     PCP: [name], MD  Recommendations for Outpatient Follow-up:  Follow up with PCP [name], MD in 1-2 weeks Wean oxygen as able but may continue if needed    Discharge Instructions     Increase activity slowly   Complete by: As directed          Discharge Diagnoses: Principal Problem:   Sepsis due to pneumonia [facility]) Active Problems:   Essential hypertension   Hyperlipidemia associated with type 2 diabetes mellitus (HCC)   Diabetes mellitus type II, controlled (HCC)   BPH (benign prostatic hyperplasia)   CAD S/P percutaneous coronary angioplasty --> PCI RCA Promus DES 2.5 mm x 23 mm   Chronic diastolic heart failure (HCC)   Mild dementia (HCC)   Acute respiratory failure with hypoxia [facility])   Palliative care encounter        Hospital course / significant events:   89 y.o. year old male with medical history of hypertension, hyperlipidemia, type 2 diabetes, dementia, BPH presented to the ED with coughing and shortness of breath.  Patient states coughing has been present for 1 week but shortness of breath started day prior to coming into the ED.   01/16: admitted to hospitalist service w/ acute resp fail and sepsis d/t PNA 01/17: continue tx  01/18: family chose comfort care/Hospice 01/19: Family has reversed the comfort care and would like to pursue short-term rehab/SNF instead.  Will have PT and OT reevaluate 01/20: Reevaluated by PT and they recommend SNF for subacute rehab 01/21: stable for discharge      Consultants:  Palliative care    Procedures/Surgeries: none      ASSESSMENT & PLAN:   Acute on chronic hypoxic respiratory failure likely due to pneumonia Community-acquired pneumonia S/p IV ceftriaxone  5-day course of therapy.   He is currently requiring 4-5 L oxygen, to maintain pulse oximetry greater than 92%.   Wean down/off O2 as able at rehab    HTN:  Controlled   T2DM:  Hemoglobin A1c 6.8.   Maintain consistent carbohydrate diet Resume home glipizide  on discharge    Dementia:  At baseline.   Continue Namenda , Remeron  and donepezil       No concerns based on BMI: Body mass index is 23.22 kg/m.SABRA Significantly low or high BMI is associated with higher medical risk.  Underweight - under 18  overweight - 25 to 29 obese - 30 or more Class 1 obesity: BMI of 30.0 to 34 Class 2 obesity: BMI of 35.0 to 39 Class 3 obesity: BMI of 40.0 to 49 Super Morbid Obesity: BMI 50-59 Super-super Morbid Obesity: BMI 60+ Healthy nutrition and physical activity advised as adjunct to other disease management and risk reduction treatments         Discharge Instructions  Allergies as of 02/20/2024       Reactions   Azithromycin Rash, Anaphylaxis   Cephalosporins Anaphylaxis, Other (See Comments)   Codeine Anaphylaxis, Itching, Rash   Doxycycline Anaphylaxis   Phenylephrine -guaifenesin    Unknown reaction    Pseudoephedrine Other (See Comments)   unknown   Amoxicillin Rash   Levofloxacin  Rash  Penicillins Rash   Other reaction(s): Unknown   Sulfa Antibiotics Rash   Sulfasalazine Rash        Medication List     TAKE these medications    acetaminophen  500 MG tablet Commonly known as: TYLENOL  Take 500 mg by mouth every 6 (six) hours as needed for moderate pain or headache.   ascorbic acid  500 MG tablet Commonly known as: VITAMIN C  Take 1,000-1,500 mg by mouth See admin instructions. Take 1000 mg in the morning and 1500 mg at night   aspirin  EC 81 MG tablet Take 81 mg by mouth daily.    B-12 2500 MCG Tabs Take 2,500 mcg by mouth daily.   benzonatate  200 MG capsule Commonly known as: TESSALON  Take 200 mg by mouth 3 (three) times daily as needed for cough.   bismuth subsalicylate 262 MG/15ML suspension Commonly known as: PEPTO BISMOL Take 30 mLs by mouth every 6 (six) hours as needed for indigestion or diarrhea or loose stools.   cetirizine  10 MG tablet Commonly known as: ZYRTEC  Take 10 mg by mouth as needed.   clindamycin  300 MG capsule Commonly known as: Cleocin  Take 2 capsules (600 mg) 1 hour prior to all dental visits.   CoQ-10 100 MG Caps Take 100 mg by mouth daily.   dextromethorphan  30 MG/5ML liquid Commonly known as: DELSYM  Take 2.5 mLs (15 mg total) by mouth 3 (three) times daily as needed for cough.   donepezil  10 MG tablet Commonly known as: ARICEPT  Take 10 mg by mouth daily.   ezetimibe  10 MG tablet Commonly known as: ZETIA  Take 10 mg by mouth daily.   fluticasone  50 MCG/ACT nasal spray Commonly known as: FLONASE  Place 2 sprays into both nostrils 2 (two) times daily as needed for allergies.   gabapentin  300 MG capsule Commonly known as: NEURONTIN  Take 300 mg by mouth at bedtime.   glipiZIDE  10 MG 24 hr tablet Commonly known as: GLUCOTROL  XL Take 10 mg by mouth daily.   Glucosamine HCl 1000 MG Tabs Take 1,000 mg by mouth 2 (two) times daily.   ipratropium 0.03 % nasal spray Commonly known as: ATROVENT  Place 2 sprays into both nostrils 2 (two) times daily as needed for rhinitis. What changed: Another medication with the same name was removed. Continue taking this medication, and follow the directions you see here.   loperamide 2 MG tablet Commonly known as: IMODIUM A-D Take 2 mg by mouth 2 (two) times daily.   meloxicam 15 MG tablet Commonly known as: MOBIC Take 15 mg by mouth daily as needed for pain.   memantine  5 MG tablet Commonly known as: NAMENDA  Take 5 mg by mouth 2 (two) times daily.   mirtazapine  15 MG  tablet Commonly known as: REMERON  Take 15 mg by mouth at bedtime.   multivitamin with minerals Tabs tablet Take 1 tablet by mouth 2 (two) times daily.   nitroGLYCERIN  0.4 MG SL tablet Commonly known as: NITROSTAT  Place 0.4 mg under the tongue every 5 (five) minutes as needed.   Omega 3 1200 MG Caps Take 2,400 mg by mouth 2 (two) times daily.   Probiotic Caps Take 1 capsule by mouth daily.   rosuvastatin  20 MG tablet Commonly known as: CRESTOR  Take 1 tablet (20 mg total) by mouth daily. TAKE 2 TABLETS BY MOUTH MONDAY, WEDNESDAY, AND FRIDAY AND 1 TABLET SATURDAY, SUNDAY, TUESDAY, AND THURSDAY What changed:  how much to take how to take this when to take this   Saw Palmetto 450 MG  Caps Take 900 mg by mouth daily.   senna-docusate 8.6-50 MG tablet Commonly known as: Senokot-S Take 1 tablet by mouth at bedtime as needed for mild constipation.   Systane 0.4-0.3 % Soln Generic drug: Polyethyl Glycol-Propyl Glycol Place 1 drop into both eyes daily.   trimethoprim -polymyxin b  ophthalmic solution Commonly known as: POLYTRIM  Place 1 drop into both eyes 2 (two) times daily.   trospium  20 MG tablet Commonly known as: SANCTURA  Take 1 tablet (20 mg total) by mouth 2 (two) times daily.   Vitamin D3 50 MCG (2000 UT) Tabs Take 4,000 Units by mouth 2 (two) times daily.   zinc  gluconate 50 MG tablet Take 50 mg by mouth daily.         Contact information for after-discharge care     Destination     [facility] .   Service: Skilled Nursing Contact information: [address] [place]  [address] [phone]                     Allergies[1]   Subjective: pt has no complaints this morning, denies CP/SOB, reports some cough, tolerating diet, no fever/chills.    Discharge Exam: BP 112/74 (BP Location: Right Arm)   Pulse 92   Temp 97.6 F (36.4 C)   Resp 18   Wt 71.3 kg   SpO2 94%   BMI 23.22 kg/m  General: Pt is alert,  awake, not in acute distress Cardiovascular: RRR, S1/S2 +murmur, no rubs, no gallops Respiratory: diminished breath sounds bilaterally, no wheezing Abdominal: Soft, NT, ND, bowel sounds + Extremities: no edema, no cyanosis     The results of significant diagnostics from this hospitalization (including imaging, microbiology, ancillary and laboratory) are listed below for reference.     Microbiology: Recent Results (from the past 240 hours)  Resp panel by RT-PCR (RSV, Flu A&B, Covid) Anterior Nasal Swab     Status: None   Collection Time: 02/15/24 12:47 PM   Specimen: Anterior Nasal Swab  Result Value Ref Range Status   SARS Coronavirus 2 by RT PCR NEGATIVE NEGATIVE Final    Comment: (NOTE) SARS-CoV-2 target nucleic acids are NOT DETECTED.  The SARS-CoV-2 RNA is generally detectable in upper respiratory specimens during the acute phase of infection. The lowest concentration of SARS-CoV-2 viral copies this assay can detect is 138 copies/mL. A negative result does not preclude SARS-Cov-2 infection and should not be used as the sole basis for treatment or other patient management decisions. A negative result may occur with  improper specimen collection/handling, submission of specimen other than nasopharyngeal swab, presence of viral mutation(s) within the areas targeted by this assay, and inadequate number of viral copies(<138 copies/mL). A negative result must be combined with clinical observations, patient history, and epidemiological information. The expected result is Negative.  Fact Sheet for Patients:  bloggercourse.com  Fact Sheet for Healthcare Providers:  seriousbroker.it  This test is no t yet approved or cleared by the [place]  FDA and  has been authorized for detection and/or diagnosis of SARS-CoV-2 by FDA under an Emergency Use Authorization (EUA). This EUA will remain  in effect (meaning this test can be used)  for the duration of the COVID-19 declaration under Section 564(b)(1) of the Act, 21 U.S.C.section 360bbb-3(b)(1), unless the authorization is terminated  or revoked sooner.       Influenza A by PCR NEGATIVE NEGATIVE Final   Influenza B by PCR NEGATIVE NEGATIVE Final    Comment: (NOTE) The Xpert  Xpress SARS-CoV-2/FLU/RSV plus assay is intended as an aid in the diagnosis of influenza from Nasopharyngeal swab specimens and should not be used as a sole basis for treatment. Nasal washings and aspirates are unacceptable for Xpert Xpress SARS-CoV-2/FLU/RSV testing.  Fact Sheet for Patients: bloggercourse.com  Fact Sheet for Healthcare Providers: seriousbroker.it  This test is not yet approved or cleared by the [place]  FDA and has been authorized for detection and/or diagnosis of SARS-CoV-2 by FDA under an Emergency Use Authorization (EUA). This EUA will remain in effect (meaning this test can be used) for the duration of the COVID-19 declaration under Section 564(b)(1) of the Act, 21 U.S.C. section 360bbb-3(b)(1), unless the authorization is terminated or revoked.     Resp Syncytial Virus by PCR NEGATIVE NEGATIVE Final    Comment: (NOTE) Fact Sheet for Patients: bloggercourse.com  Fact Sheet for Healthcare Providers: seriousbroker.it  This test is not yet approved or cleared by the [place]  FDA and has been authorized for detection and/or diagnosis of SARS-CoV-2 by FDA under an Emergency Use Authorization (EUA). This EUA will remain in effect (meaning this test can be used) for the duration of the COVID-19 declaration under Section 564(b)(1) of the Act, 21 U.S.C. section 360bbb-3(b)(1), unless the authorization is terminated or revoked.  Performed at [facility], [address] [place], [place] [address]   Blood culture (routine x 2)     Status: None    Collection Time: 02/15/24 12:47 PM   Specimen: BLOOD RIGHT ARM  Result Value Ref Range Status   Specimen Description BLOOD RIGHT ARM  Final   Special Requests   Final    BOTTLES DRAWN AEROBIC AND ANAEROBIC Blood Culture results may not be optimal due to an inadequate volume of blood received in culture bottles   Culture   Final    NO GROWTH 5 DAYS Performed at [facility], [address] [place], [place] [address]    Report Status 02/20/2024 FINAL  Final  Blood culture (routine x 2)     Status: None   Collection Time: 02/15/24 12:47 PM   Specimen: BLOOD LEFT ARM  Result Value Ref Range Status   Specimen Description BLOOD LEFT ARM  Final   Special Requests   Final    BOTTLES DRAWN AEROBIC AND ANAEROBIC Blood Culture results may not be optimal due to an inadequate volume of blood received in culture bottles   Culture   Final    NO GROWTH 5 DAYS Performed at [facility], [address] [place], [place] [address]    Report Status 02/20/2024 FINAL  Final     Labs: BNP (last 3 results) No results for input(s): BNP in the last 8760 hours. Basic Metabolic Panel: Recent Labs  Lab 02/15/24 1107 02/15/24 2152 02/16/24 0413 02/19/24 0431  NA 138  --  138 140  K 4.4  --  4.1 3.9  CL 102  --  105 103  CO2 24  --  26 28  GLUCOSE 153*  --  90 133*  BUN 19  --  18 17  CREATININE 0.85  --  0.78 0.69  CALCIUM  9.7  --  8.7* 8.6*  MG  --  1.9  --   --    Liver Function Tests: No results for input(s): AST, ALT, ALKPHOS, BILITOT, PROT, ALBUMIN in the last 168 hours. No results for input(s): LIPASE, AMYLASE in the last 168 hours. No results for input(s): AMMONIA in the last 168 hours. CBC: Recent  Labs  Lab 02/15/24 1107 02/16/24 0413 02/19/24 0431  WBC 12.0* 8.2 6.3  HGB 14.8 12.4* 12.7*  HCT 45.5 38.2* 38.2*  MCV 95.0 96.2 93.2  PLT 135* 102* 126*   Cardiac Enzymes: No results for input(s): CKTOTAL, CKMB, CKMBINDEX, TROPONINI  in the last 168 hours. BNP: Invalid input(s): POCBNP CBG: Recent Labs  Lab 02/19/24 0827 02/19/24 1224 02/19/24 1626 02/19/24 2200 02/20/24 0859  GLUCAP 123* 168* 162* 185* 130*   D-Dimer No results for input(s): DDIMER in the last 72 hours. Hgb A1c No results for input(s): HGBA1C in the last 72 hours. Lipid Profile No results for input(s): CHOL, HDL, LDLCALC, TRIG, CHOLHDL, LDLDIRECT in the last 72 hours. Thyroid  function studies No results for input(s): TSH, T4TOTAL, T3FREE, THYROIDAB in the last 72 hours.  Invalid input(s): FREET3 Anemia work up No results for input(s): VITAMINB12, FOLATE, FERRITIN, TIBC, IRON, RETICCTPCT in the last 72 hours. Urinalysis    Component Value Date/Time   COLORURINE YELLOW (A) 07/26/2023 1651   APPEARANCEUR CLEAR (A) 07/26/2023 1651   APPEARANCEUR Clear 03/29/2023 1052   LABSPEC 1.025 07/26/2023 1651   PHURINE 5.0 07/26/2023 1651   GLUCOSEU NEGATIVE 07/26/2023 1651   HGBUR NEGATIVE 07/26/2023 1651   BILIRUBINUR NEGATIVE 07/26/2023 1651   BILIRUBINUR Negative 03/29/2023 1052   KETONESUR NEGATIVE 07/26/2023 1651   PROTEINUR NEGATIVE 07/26/2023 1651   NITRITE NEGATIVE 07/26/2023 1651   LEUKOCYTESUR NEGATIVE 07/26/2023 1651   Sepsis Labs Recent Labs  Lab 02/15/24 1107 02/16/24 0413 02/19/24 0431  WBC 12.0* 8.2 6.3   Microbiology Recent Results (from the past 240 hours)  Resp panel by RT-PCR (RSV, Flu A&B, Covid) Anterior Nasal Swab     Status: None   Collection Time: 02/15/24 12:47 PM   Specimen: Anterior Nasal Swab  Result Value Ref Range Status   SARS Coronavirus 2 by RT PCR NEGATIVE NEGATIVE Final    Comment: (NOTE) SARS-CoV-2 target nucleic acids are NOT DETECTED.  The SARS-CoV-2 RNA is generally detectable in upper respiratory specimens during the acute phase of infection. The lowest concentration of SARS-CoV-2 viral copies this assay can detect is 138 copies/mL. A negative result  does not preclude SARS-Cov-2 infection and should not be used as the sole basis for treatment or other patient management decisions. A negative result may occur with  improper specimen collection/handling, submission of specimen other than nasopharyngeal swab, presence of viral mutation(s) within the areas targeted by this assay, and inadequate number of viral copies(<138 copies/mL). A negative result must be combined with clinical observations, patient history, and epidemiological information. The expected result is Negative.  Fact Sheet for Patients:  bloggercourse.com  Fact Sheet for Healthcare Providers:  seriousbroker.it  This test is no t yet approved or cleared by the [place]  FDA and  has been authorized for detection and/or diagnosis of SARS-CoV-2 by FDA under an Emergency Use Authorization (EUA). This EUA will remain  in effect (meaning this test can be used) for the duration of the COVID-19 declaration under Section 564(b)(1) of the Act, 21 U.S.C.section 360bbb-3(b)(1), unless the authorization is terminated  or revoked sooner.       Influenza A by PCR NEGATIVE NEGATIVE Final   Influenza B by PCR NEGATIVE NEGATIVE Final    Comment: (NOTE) The Xpert Xpress SARS-CoV-2/FLU/RSV plus assay is intended as an aid in the diagnosis of influenza from Nasopharyngeal swab specimens and should not be used as a sole basis for treatment. Nasal washings and aspirates are unacceptable for Xpert Xpress SARS-CoV-2/FLU/RSV testing.  Fact Sheet for Patients: bloggercourse.com  Fact Sheet for Healthcare Providers: seriousbroker.it  This test is not yet approved or cleared by the [place]  FDA and has been authorized for detection and/or diagnosis of SARS-CoV-2 by FDA under an Emergency Use Authorization (EUA). This EUA will remain in effect (meaning this test can be used) for  the duration of the COVID-19 declaration under Section 564(b)(1) of the Act, 21 U.S.C. section 360bbb-3(b)(1), unless the authorization is terminated or revoked.     Resp Syncytial Virus by PCR NEGATIVE NEGATIVE Final    Comment: (NOTE) Fact Sheet for Patients: bloggercourse.com  Fact Sheet for Healthcare Providers: seriousbroker.it  This test is not yet approved or cleared by the [place]  FDA and has been authorized for detection and/or diagnosis of SARS-CoV-2 by FDA under an Emergency Use Authorization (EUA). This EUA will remain in effect (meaning this test can be used) for the duration of the COVID-19 declaration under Section 564(b)(1) of the Act, 21 U.S.C. section 360bbb-3(b)(1), unless the authorization is terminated or revoked.  Performed at [facility], [address] [place], [place] [address]   Blood culture (routine x 2)     Status: None   Collection Time: 02/15/24 12:47 PM   Specimen: BLOOD RIGHT ARM  Result Value Ref Range Status   Specimen Description BLOOD RIGHT ARM  Final   Special Requests   Final    BOTTLES DRAWN AEROBIC AND ANAEROBIC Blood Culture results may not be optimal due to an inadequate volume of blood received in culture bottles   Culture   Final    NO GROWTH 5 DAYS Performed at [facility], [address] [place], [place] [address]    Report Status 02/20/2024 FINAL  Final  Blood culture (routine x 2)     Status: None   Collection Time: 02/15/24 12:47 PM   Specimen: BLOOD LEFT ARM  Result Value Ref Range Status   Specimen Description BLOOD LEFT ARM  Final   Special Requests   Final    BOTTLES DRAWN AEROBIC AND ANAEROBIC Blood Culture results may not be optimal due to an inadequate volume of blood received in culture bottles   Culture   Final    NO GROWTH 5 DAYS Performed at [facility], [address], [place], [place] [address]    Report Status  02/20/2024 FINAL  Final   Imaging DG Chest 2 View Result Date: 02/15/2024 EXAM: 2 VIEW(S) XRAY OF THE CHEST 02/15/2024 11:32:00 AM COMPARISON: 06/25/2023 CLINICAL HISTORY: Hypoxia/cough. FINDINGS: LUNGS AND PLEURA: Ill-defined opacity at left lung base, which could represent atelectasis or developing infection. No pleural effusion. No pneumothorax. HEART AND MEDIASTINUM: TAVR. Atherosclerotic calcification of aortic arch. No acute abnormality of the cardiac and mediastinal silhouettes. BONES AND SOFT TISSUES: Thoracic spondylosis. IMPRESSION: 1. Ill-defined opacity at the left lung base, which could represent atelectasis or developing infection. 2. Aortic Atherosclerosis (ICD10-I70.0). Electronically signed by: Ryan Chess MD 02/15/2024 11:55 AM EST RP Workstation: [id number]      Time coordinating discharge: over 30 minutes  SIGNED:  Laneta Blunt DO [facility]       [1]  Allergies Allergen Reactions   Azithromycin Rash and Anaphylaxis   Cephalosporins Anaphylaxis and Other (See Comments)   Codeine Anaphylaxis, Itching and Rash   Doxycycline Anaphylaxis   Phenylephrine -Guaifenesin     Unknown reaction    Pseudoephedrine Other (See Comments)    unknown    Amoxicillin Rash   Levofloxacin  Rash   Penicillins Rash  Other reaction(s): Unknown   Sulfa Antibiotics Rash   Sulfasalazine Rash   "

## 2024-02-20 NOTE — Plan of Care (Signed)
" °  Problem: Clinical Measurements: Goal: Signs and symptoms of infection will decrease Outcome: Progressing   Problem: Respiratory: Goal: Ability to maintain adequate ventilation will improve Outcome: Progressing   Problem: Coping: Goal: Ability to adjust to condition or change in health will improve Outcome: Progressing   Problem: Fluid Volume: Goal: Ability to maintain a balanced intake and output will improve Outcome: Progressing   "

## 2024-02-20 NOTE — Progress Notes (Signed)
 Pt report called to [facility] all questions answered

## 2024-02-21 ENCOUNTER — Ambulatory Visit: Admitting: Podiatry
